# Patient Record
Sex: Female | Born: 1935 | Race: Black or African American | Hispanic: No | Smoking: Never smoker
Health system: Southern US, Community
[De-identification: ages and names within clinical notes are randomized; demographics above are authoritative.]

## PROBLEM LIST (undated history)

## (undated) DIAGNOSIS — E785 Hyperlipidemia, unspecified: Secondary | ICD-10-CM

## (undated) DIAGNOSIS — I4891 Unspecified atrial fibrillation: Secondary | ICD-10-CM

## (undated) DIAGNOSIS — K922 Gastrointestinal hemorrhage, unspecified: Secondary | ICD-10-CM

## (undated) DIAGNOSIS — I639 Cerebral infarction, unspecified: Secondary | ICD-10-CM

## (undated) DIAGNOSIS — I10 Essential (primary) hypertension: Secondary | ICD-10-CM

## (undated) DIAGNOSIS — E079 Disorder of thyroid, unspecified: Secondary | ICD-10-CM

## (undated) DIAGNOSIS — F0390 Unspecified dementia without behavioral disturbance: Secondary | ICD-10-CM

## (undated) DIAGNOSIS — E119 Type 2 diabetes mellitus without complications: Secondary | ICD-10-CM

## (undated) HISTORY — PX: EYE SURGERY: SHX253

---

## 2005-11-05 ENCOUNTER — Ambulatory Visit: Payer: Self-pay | Admitting: Internal Medicine

## 2006-03-10 ENCOUNTER — Ambulatory Visit: Payer: Self-pay | Admitting: Unknown Physician Specialty

## 2007-01-15 ENCOUNTER — Ambulatory Visit: Payer: Self-pay | Admitting: Internal Medicine

## 2007-03-03 ENCOUNTER — Ambulatory Visit: Payer: Self-pay | Admitting: Internal Medicine

## 2007-04-15 ENCOUNTER — Ambulatory Visit: Payer: Self-pay | Admitting: Unknown Physician Specialty

## 2007-07-02 ENCOUNTER — Ambulatory Visit: Payer: Self-pay | Admitting: Unknown Physician Specialty

## 2008-01-12 ENCOUNTER — Ambulatory Visit: Payer: Self-pay | Admitting: Internal Medicine

## 2008-01-21 ENCOUNTER — Ambulatory Visit: Payer: Self-pay | Admitting: Internal Medicine

## 2008-02-24 ENCOUNTER — Ambulatory Visit: Payer: Self-pay | Admitting: General Surgery

## 2008-02-24 ENCOUNTER — Other Ambulatory Visit: Payer: Self-pay

## 2008-03-07 ENCOUNTER — Ambulatory Visit: Payer: Self-pay | Admitting: General Surgery

## 2008-03-26 ENCOUNTER — Ambulatory Visit: Payer: Self-pay | Admitting: Oncology

## 2008-04-07 ENCOUNTER — Ambulatory Visit: Payer: Self-pay | Admitting: Oncology

## 2008-04-26 ENCOUNTER — Ambulatory Visit: Payer: Self-pay | Admitting: Oncology

## 2008-06-26 ENCOUNTER — Ambulatory Visit: Payer: Self-pay | Admitting: Oncology

## 2008-06-28 ENCOUNTER — Ambulatory Visit: Payer: Self-pay | Admitting: Internal Medicine

## 2008-07-12 ENCOUNTER — Ambulatory Visit: Payer: Self-pay | Admitting: Internal Medicine

## 2008-07-13 ENCOUNTER — Ambulatory Visit: Payer: Self-pay | Admitting: Oncology

## 2008-07-26 ENCOUNTER — Ambulatory Visit: Payer: Self-pay | Admitting: Oncology

## 2009-02-28 ENCOUNTER — Ambulatory Visit: Payer: Self-pay | Admitting: Internal Medicine

## 2009-07-10 ENCOUNTER — Ambulatory Visit: Payer: Self-pay | Admitting: Internal Medicine

## 2009-10-08 ENCOUNTER — Inpatient Hospital Stay: Payer: Self-pay | Admitting: Surgery

## 2009-10-20 ENCOUNTER — Ambulatory Visit: Payer: Self-pay | Admitting: Surgery

## 2009-10-27 ENCOUNTER — Ambulatory Visit: Payer: Self-pay | Admitting: Surgery

## 2010-05-03 ENCOUNTER — Ambulatory Visit: Payer: Self-pay | Admitting: Internal Medicine

## 2011-06-06 ENCOUNTER — Ambulatory Visit: Payer: Self-pay | Admitting: Unknown Physician Specialty

## 2011-07-10 ENCOUNTER — Ambulatory Visit: Payer: Self-pay | Admitting: Internal Medicine

## 2012-10-14 ENCOUNTER — Ambulatory Visit: Payer: Self-pay | Admitting: Internal Medicine

## 2013-12-21 ENCOUNTER — Encounter (INDEPENDENT_AMBULATORY_CARE_PROVIDER_SITE_OTHER): Payer: Self-pay | Admitting: Ophthalmology

## 2013-12-28 ENCOUNTER — Encounter (INDEPENDENT_AMBULATORY_CARE_PROVIDER_SITE_OTHER): Payer: Self-pay | Admitting: Ophthalmology

## 2013-12-29 ENCOUNTER — Encounter (INDEPENDENT_AMBULATORY_CARE_PROVIDER_SITE_OTHER): Payer: 59 | Admitting: Ophthalmology

## 2013-12-29 DIAGNOSIS — H43819 Vitreous degeneration, unspecified eye: Secondary | ICD-10-CM

## 2013-12-29 DIAGNOSIS — E11319 Type 2 diabetes mellitus with unspecified diabetic retinopathy without macular edema: Secondary | ICD-10-CM

## 2013-12-29 DIAGNOSIS — H35039 Hypertensive retinopathy, unspecified eye: Secondary | ICD-10-CM

## 2013-12-29 DIAGNOSIS — E1139 Type 2 diabetes mellitus with other diabetic ophthalmic complication: Secondary | ICD-10-CM

## 2013-12-29 DIAGNOSIS — H348192 Central retinal vein occlusion, unspecified eye, stable: Secondary | ICD-10-CM

## 2013-12-29 DIAGNOSIS — E1165 Type 2 diabetes mellitus with hyperglycemia: Secondary | ICD-10-CM

## 2013-12-29 DIAGNOSIS — I10 Essential (primary) hypertension: Secondary | ICD-10-CM

## 2014-02-28 ENCOUNTER — Ambulatory Visit: Payer: Self-pay | Admitting: Internal Medicine

## 2014-05-03 ENCOUNTER — Ambulatory Visit (INDEPENDENT_AMBULATORY_CARE_PROVIDER_SITE_OTHER): Payer: 59 | Admitting: Ophthalmology

## 2014-05-03 DIAGNOSIS — H35039 Hypertensive retinopathy, unspecified eye: Secondary | ICD-10-CM

## 2014-05-03 DIAGNOSIS — E11319 Type 2 diabetes mellitus with unspecified diabetic retinopathy without macular edema: Secondary | ICD-10-CM

## 2014-05-03 DIAGNOSIS — I10 Essential (primary) hypertension: Secondary | ICD-10-CM

## 2014-05-03 DIAGNOSIS — H43819 Vitreous degeneration, unspecified eye: Secondary | ICD-10-CM

## 2014-05-03 DIAGNOSIS — E1139 Type 2 diabetes mellitus with other diabetic ophthalmic complication: Secondary | ICD-10-CM

## 2014-05-03 DIAGNOSIS — H348192 Central retinal vein occlusion, unspecified eye, stable: Secondary | ICD-10-CM | POA: Diagnosis not present

## 2014-05-03 DIAGNOSIS — E1165 Type 2 diabetes mellitus with hyperglycemia: Secondary | ICD-10-CM | POA: Diagnosis not present

## 2014-11-03 ENCOUNTER — Ambulatory Visit (INDEPENDENT_AMBULATORY_CARE_PROVIDER_SITE_OTHER): Payer: 59 | Admitting: Ophthalmology

## 2015-07-04 DIAGNOSIS — E039 Hypothyroidism, unspecified: Secondary | ICD-10-CM | POA: Insufficient documentation

## 2015-07-26 ENCOUNTER — Emergency Department: Payer: Medicare Other

## 2015-07-26 ENCOUNTER — Inpatient Hospital Stay: Payer: Medicare Other

## 2015-07-26 ENCOUNTER — Inpatient Hospital Stay
Admission: EM | Admit: 2015-07-26 | Discharge: 2015-07-27 | DRG: 305 | Disposition: A | Discharge status: Home / Self Care | Payer: Medicare Other | Attending: Internal Medicine | Admitting: Internal Medicine

## 2015-07-26 ENCOUNTER — Inpatient Hospital Stay (HOSPITAL_COMMUNITY)
Admit: 2015-07-26 | Discharge: 2015-07-26 | Disposition: A | Discharge status: Home / Self Care | Payer: Medicare Other | Attending: Internal Medicine | Admitting: Internal Medicine

## 2015-07-26 DIAGNOSIS — Z9889 Other specified postprocedural states: Secondary | ICD-10-CM

## 2015-07-26 DIAGNOSIS — E039 Hypothyroidism, unspecified: Secondary | ICD-10-CM | POA: Diagnosis present

## 2015-07-26 DIAGNOSIS — I69398 Other sequelae of cerebral infarction: Secondary | ICD-10-CM

## 2015-07-26 DIAGNOSIS — N289 Disorder of kidney and ureter, unspecified: Secondary | ICD-10-CM | POA: Diagnosis present

## 2015-07-26 DIAGNOSIS — I10 Essential (primary) hypertension: Principal | ICD-10-CM | POA: Diagnosis present

## 2015-07-26 DIAGNOSIS — I340 Nonrheumatic mitral (valve) insufficiency: Secondary | ICD-10-CM

## 2015-07-26 DIAGNOSIS — Z888 Allergy status to other drugs, medicaments and biological substances status: Secondary | ICD-10-CM | POA: Diagnosis not present

## 2015-07-26 DIAGNOSIS — E1165 Type 2 diabetes mellitus with hyperglycemia: Secondary | ICD-10-CM | POA: Diagnosis present

## 2015-07-26 DIAGNOSIS — Z79899 Other long term (current) drug therapy: Secondary | ICD-10-CM | POA: Diagnosis not present

## 2015-07-26 DIAGNOSIS — E785 Hyperlipidemia, unspecified: Secondary | ICD-10-CM | POA: Diagnosis present

## 2015-07-26 DIAGNOSIS — I69312 Visuospatial deficit and spatial neglect following cerebral infarction: Secondary | ICD-10-CM | POA: Diagnosis not present

## 2015-07-26 DIAGNOSIS — H539 Unspecified visual disturbance: Secondary | ICD-10-CM | POA: Diagnosis present

## 2015-07-26 DIAGNOSIS — G459 Transient cerebral ischemic attack, unspecified: Secondary | ICD-10-CM

## 2015-07-26 DIAGNOSIS — Z7982 Long term (current) use of aspirin: Secondary | ICD-10-CM | POA: Diagnosis not present

## 2015-07-26 DIAGNOSIS — I639 Cerebral infarction, unspecified: Secondary | ICD-10-CM | POA: Diagnosis present

## 2015-07-26 DIAGNOSIS — D638 Anemia in other chronic diseases classified elsewhere: Secondary | ICD-10-CM | POA: Diagnosis present

## 2015-07-26 DIAGNOSIS — N189 Chronic kidney disease, unspecified: Secondary | ICD-10-CM

## 2015-07-26 DIAGNOSIS — I4891 Unspecified atrial fibrillation: Secondary | ICD-10-CM | POA: Diagnosis present

## 2015-07-26 DIAGNOSIS — R42 Dizziness and giddiness: Secondary | ICD-10-CM

## 2015-07-26 HISTORY — DX: Unspecified atrial fibrillation: I48.91

## 2015-07-26 HISTORY — DX: Type 2 diabetes mellitus without complications: E11.9

## 2015-07-26 HISTORY — DX: Hyperlipidemia, unspecified: E78.5

## 2015-07-26 HISTORY — DX: Essential (primary) hypertension: I10

## 2015-07-26 HISTORY — DX: Disorder of thyroid, unspecified: E07.9

## 2015-07-26 LAB — BASIC METABOLIC PANEL
Anion gap: 7 (ref 5–15)
BUN: 27 mg/dL — AB (ref 6–20)
CHLORIDE: 113 mmol/L — AB (ref 101–111)
CO2: 24 mmol/L (ref 22–32)
CREATININE: 1.47 mg/dL — AB (ref 0.44–1.00)
Calcium: 8.7 mg/dL — ABNORMAL LOW (ref 8.9–10.3)
GFR calc Af Amer: 37 mL/min — ABNORMAL LOW (ref >60)
GFR calc non Af Amer: 32 mL/min — ABNORMAL LOW (ref >60)
Glucose, Bld: 150 mg/dL — ABNORMAL HIGH (ref 65–99)
POTASSIUM: 4.1 mmol/L (ref 3.5–5.1)
SODIUM: 144 mmol/L (ref 135–145)

## 2015-07-26 LAB — URINALYSIS COMPLETE WITH MICROSCOPIC (ARMC ONLY)
BILIRUBIN URINE: NEGATIVE
GLUCOSE, UA: NEGATIVE mg/dL
HGB URINE DIPSTICK: NEGATIVE
Ketones, ur: NEGATIVE mg/dL
Nitrite: NEGATIVE
Protein, ur: 30 mg/dL — AB
Specific Gravity, Urine: 1.009 (ref 1.005–1.030)
pH: 6 (ref 5.0–8.0)

## 2015-07-26 LAB — CBC
HEMATOCRIT: 32.5 % — AB (ref 35.0–47.0)
Hemoglobin: 10.4 g/dL — ABNORMAL LOW (ref 12.0–16.0)
MCH: 25.2 pg — AB (ref 26.0–34.0)
MCHC: 32 g/dL (ref 32.0–36.0)
MCV: 78.6 fL — AB (ref 80.0–100.0)
PLATELETS: 300 10*3/uL (ref 150–440)
RBC: 4.13 MIL/uL (ref 3.80–5.20)
RDW: 16.4 % — AB (ref 11.5–14.5)
WBC: 5.7 10*3/uL (ref 3.6–11.0)

## 2015-07-26 LAB — GLUCOSE, CAPILLARY
GLUCOSE-CAPILLARY: 88 mg/dL (ref 65–99)
Glucose-Capillary: 186 mg/dL — ABNORMAL HIGH (ref 65–99)

## 2015-07-26 MED ORDER — LATANOPROST 0.005 % OP SOLN
1.0000 [drp] | Freq: Every day | OPHTHALMIC | Status: DC
  Administered 2015-07-26: 1 [drp] via OPHTHALMIC
  Filled 2015-07-26: qty 2.5

## 2015-07-26 MED ORDER — INSULIN ASPART 100 UNIT/ML ~~LOC~~ SOLN
4.0000 [IU] | Freq: Three times a day (TID) | SUBCUTANEOUS | Status: DC
  Administered 2015-07-26: 4 [IU] via SUBCUTANEOUS

## 2015-07-26 MED ORDER — ACETAMINOPHEN 650 MG RE SUPP: 650.0000 mg | Freq: Four times a day (QID) | RECTAL | Status: DC | PRN

## 2015-07-26 MED ORDER — SODIUM CHLORIDE 0.9 % IJ SOLN
3.0000 mL | Freq: Two times a day (BID) | INTRAMUSCULAR | Status: DC
  Administered 2015-07-26 – 2015-07-27 (×3): 3 mL via INTRAVENOUS

## 2015-07-26 MED ORDER — SODIUM CHLORIDE 0.9 % IJ SOLN: 3.0000 mL | INTRAMUSCULAR | Status: DC | PRN

## 2015-07-26 MED ORDER — INSULIN ASPART 100 UNIT/ML ~~LOC~~ SOLN
0.0000 [IU] | Freq: Three times a day (TID) | SUBCUTANEOUS | Status: DC
  Administered 2015-07-26: 2 [IU] via SUBCUTANEOUS
  Filled 2015-07-26: qty 6

## 2015-07-26 MED ORDER — LISINOPRIL 20 MG PO TABS
40.0000 mg | ORAL_TABLET | Freq: Every day | ORAL | Status: DC
  Administered 2015-07-26 – 2015-07-27 (×2): 40 mg via ORAL
  Filled 2015-07-26: qty 1
  Filled 2015-07-26 (×2): qty 2

## 2015-07-26 MED ORDER — LEVOTHYROXINE SODIUM 112 MCG PO TABS
112.0000 ug | ORAL_TABLET | ORAL | Status: DC
  Administered 2015-07-27: 112 ug via ORAL
  Filled 2015-07-26: qty 1

## 2015-07-26 MED ORDER — ONDANSETRON HCL 4 MG/2ML IJ SOLN: 4.0000 mg | Freq: Four times a day (QID) | INTRAMUSCULAR | Status: DC | PRN

## 2015-07-26 MED ORDER — FERROUS SULFATE 325 (65 FE) MG PO TABS
325.0000 mg | ORAL_TABLET | Freq: Every day | ORAL | Status: DC
  Administered 2015-07-26 – 2015-07-27 (×2): 325 mg via ORAL
  Filled 2015-07-26 (×2): qty 1

## 2015-07-26 MED ORDER — VITAMIN B-12 1000 MCG PO TABS
1000.0000 ug | ORAL_TABLET | Freq: Every day | ORAL | Status: DC
  Administered 2015-07-26 – 2015-07-27 (×2): 1000 ug via ORAL
  Filled 2015-07-26 (×2): qty 1

## 2015-07-26 MED ORDER — ATORVASTATIN CALCIUM 10 MG PO TABS
10.0000 mg | ORAL_TABLET | Freq: Every day | ORAL | Status: DC
  Administered 2015-07-26: 10 mg via ORAL
  Filled 2015-07-26: qty 1

## 2015-07-26 MED ORDER — ENOXAPARIN SODIUM 40 MG/0.4ML ~~LOC~~ SOLN: 40.0000 mg | SUBCUTANEOUS | Status: DC

## 2015-07-26 MED ORDER — ACETAMINOPHEN 325 MG PO TABS: 650.0000 mg | ORAL_TABLET | Freq: Four times a day (QID) | ORAL | Status: DC | PRN

## 2015-07-26 MED ORDER — MECLIZINE HCL 25 MG PO TABS
25.0000 mg | ORAL_TABLET | Freq: Three times a day (TID) | ORAL | Status: DC | PRN
  Administered 2015-07-26: 25 mg via ORAL
  Filled 2015-07-26: qty 1

## 2015-07-26 MED ORDER — ASPIRIN EC 325 MG PO TBEC
325.0000 mg | DELAYED_RELEASE_TABLET | Freq: Every day | ORAL | Status: DC
  Administered 2015-07-26 – 2015-07-27 (×2): 325 mg via ORAL
  Filled 2015-07-26 (×2): qty 1

## 2015-07-26 MED ORDER — ONDANSETRON HCL 4 MG PO TABS: 4.0000 mg | ORAL_TABLET | Freq: Four times a day (QID) | ORAL | Status: DC | PRN

## 2015-07-26 MED ORDER — SODIUM CHLORIDE 0.9 % IV SOLN: 250.0000 mL | INTRAVENOUS | Status: DC | PRN

## 2015-07-26 MED ORDER — ENOXAPARIN SODIUM 30 MG/0.3ML ~~LOC~~ SOLN
30.0000 mg | SUBCUTANEOUS | Status: DC
  Administered 2015-07-26: 30 mg via SUBCUTANEOUS
  Filled 2015-07-26: qty 0.3

## 2015-07-26 MED ORDER — INSULIN ASPART 100 UNIT/ML ~~LOC~~ SOLN: 0.0000 [IU] | Freq: Every day | SUBCUTANEOUS | Status: DC

## 2015-07-26 MED ORDER — GLIPIZIDE ER 10 MG PO TB24
10.0000 mg | ORAL_TABLET | Freq: Every day | ORAL | Status: DC
  Administered 2015-07-26 – 2015-07-27 (×2): 10 mg via ORAL
  Filled 2015-07-26 (×2): qty 1

## 2015-07-26 NOTE — Evaluation (Signed)
Physical Therapy Evaluation Patient Details Name: Patricia Dudley MRN: [id number] DOB: Nov 19, 1929 Today's Date: 07/26/2015   History of Present Illness  Pt is an 81 y/o female that presents after experiencing dizziness and numbness/tingling in her L side at home. Noted to have systolic BP at home of over 240, since that time it has decreased. No current complaints of headache/chest pain.   Clinical Impression  Patient admitted with TIA like symptoms with L sided numbness/tingling. She appears at roughly her baseline mobility currently with no loss of balance and 10/12 on modified DGI during ambulation without AD. She demonstrates symmetrical gait and no deficits from baseline identified aside from above mentioned drifting with ambulation during head turns. She would benefit from continued skilled PT services to provide HEP for higher level balance activities (tandem stance, single leg stance).     Follow Up Recommendations No PT follow up    Equipment Recommendations       Recommendations for Other Services       Precautions / Restrictions Precautions Precautions: None Restrictions Weight Bearing Restrictions: No      Mobility  Bed Mobility Overal bed mobility: Independent             General bed mobility comments: No deficits noted.   Transfers Overall transfer level: Independent               General transfer comment: Patient demonstrates no loss of balance in standing.   Ambulation/Gait Ambulation/Gait assistance: Supervision Ambulation Distance (Feet): 200 Feet   Gait Pattern/deviations: WFL(Within Functional Limits)   Gait velocity interpretation: at or above normal speed for age/gender General Gait Details: Mild loss of speed with horizontal/vertical head turns, no other balance deficits noted. This appears to be her baseline.   Stairs            Wheelchair Mobility    Modified Rankin (Stroke Patients Only)       Balance Overall  balance assessment: Needs assistance   Sitting balance-Leahy Scale: Good     Standing balance support: No upper extremity supported Standing balance-Leahy Scale: Good Standing balance comment: Modified DGI of 10/12 without AD indicating she is not a high falls risk.                              Pertinent Vitals/Pain Pain Assessment: No/denies pain    Home Living Family/patient expects to be discharged to:: Private residence Living Arrangements: Children Available Help at Discharge: Family Type of Home: House Home Access: Stairs to enter   Technical brewer of Steps: 1 Home Layout: One level Home Equipment: None      Prior Function Level of Independence: Independent         Comments: Patient reports tripping over something at one point, no recent falls.      Hand Dominance        Extremity/Trunk Assessment   Upper Extremity Assessment: Overall WFL for tasks assessed           Lower Extremity Assessment: Overall WFL for tasks assessed         Communication   Communication: No difficulties  Cognition Arousal/Alertness: Awake/alert Behavior During Therapy: WFL for tasks assessed/performed Overall Cognitive Status: Within Functional Limits for tasks assessed (She may be hard of hearing, but required repetition of instructions on occassion. )                      General  Comments      Exercises        Assessment/Plan    PT Assessment Patient needs continued PT services  PT Diagnosis Difficulty walking   PT Problem List Decreased strength;Decreased balance  PT Treatment Interventions Gait training;Therapeutic activities;Therapeutic exercise;Balance training   PT Goals (Current goals can be found in the Care Plan section) Acute Rehab PT Goals Patient Stated Goal: To return home.  PT Goal Formulation: With patient Time For Goal Achievement: 08/09/15 Potential to Achieve Goals: Good Additional Goals Additional Goal #1:   (Pt will demonstrate low falls risk on Berg Balance Test )    Frequency Min 2X/week   Barriers to discharge        Co-evaluation               End of Session Equipment Utilized During Treatment: Gait belt Activity Tolerance: Patient tolerated treatment well Patient left: in chair;with call bell/phone within reach;with family/visitor present Nurse Communication: Mobility status    Functional Assessment Tool Used: Modified DGI Functional Limitation: Mobility: Walking and moving around Mobility: Walking and Moving Around Current Status [phone]): At least 1 percent but less than 20 percent impaired, limited or restricted Mobility: Walking and Moving Around Goal Status [phone]): At least 1 percent but less than 20 percent impaired, limited or restricted    Time: [id number] PT Time Calculation (min) (ACUTE ONLY): 10 min   Charges:   PT Evaluation $Initial PT Evaluation Tier I: 1 Procedure     PT G Codes:   PT G-Codes **NOT FOR INPATIENT CLASS** Functional Assessment Tool Used: Modified DGI Functional Limitation: Mobility: Walking and moving around Mobility: Walking and Moving Around Current Status [id number]): At least 1 percent but less than 20 percent impaired, limited or restricted Mobility: Walking and Moving Around Goal Status [phone]): At least 1 percent but less than 20 percent impaired, limited or restricted    Kerman Passey, PT, DPT    07/26/2015, 3:05 PM

## 2015-07-26 NOTE — Care Management Obs Status (Signed)
[facility] NOTIFICATION   Patient Details  Name: Patricia Dudley MRN: [id number] Date of Birth: [birth year]   Medicare Observation Status Notification Given:  Yes  OBSV notice to patient and family in ER;daughter signed notice.   Beau Fanny, RN 07/26/2015, 1:02 PM

## 2015-07-26 NOTE — Progress Notes (Signed)
Received pt from ED to Rm 235. Pt AOx4. Hypertensive and asymptomatic.  On tele. No signs of acute distress. IV access intact and saline locked. Medications administered (See MAR). Admission completed. Assessment completed. DVT prophylaxis assessed and completed.   Education provided on call bell, bed alarm, telephone and IV/IV Pole/IV Alarms. 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 White Board was completed and updated PRN.   Dietary specification confirmed.   Family at bedside. Pt resting comfortably and quietly w/bed in low and locked position and call bell and telephone within reach. Will continue to monitor.

## 2015-07-26 NOTE — Progress Notes (Signed)
MD Dr. Ether Griffins notified that pt BP is 168/80.  No new orders given.  Will continue to monitor.

## 2015-07-26 NOTE — ED Provider Notes (Signed)
Time Seen: Approximately *11:15 I have reviewed the triage notes  Chief Complaint: Dizziness   History of Present Illness: Patricia Dudley is a 81 y.o. female *who presents with 2 episodes this morning of "" dizziness"". She describes an episode that is described as dizziness and feeling off balance and her room spinning. Patient states that she broke up with his symptoms of vertigo that seemed to improve after she got up and ambulated to the bathroom. Patient states she then returned to bed when she laid down flat vertiginous symptoms return. The patient denies any focal weakness or difficulty with speech or swallowing. She denies any headaches. She's had a previous history of atrial fibrillation but is normally in a normal sinus rhythm. She was hypertensive per EMS at 240/109. Patient here is 177/68. The patient has not had her normal medications for hypertension. Patient states she had an episode yesterday of some left upper and lower extremity numbness. She states she did not have any vertiginous symptoms at that time. She denies any of these symptoms at this time. Past Medical History  Diagnosis Date  . Hypertension   . Hyperlipemia   . A-fib ([place])   . Diabetes mellitus without complication ([place])   . Thyroid disease     Patient Active Problem List   Diagnosis Date Noted  . 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 Renal insufficiency 07/26/2015  . TIA (transient ischemic attack) 07/26/2015  . Vertigo 07/26/2015    Past Surgical History  Procedure Laterality Date  . Eye surgery      left    Past Surgical History  Procedure Laterality Date  . Eye surgery      left    No current outpatient prescriptions on file.  Allergies:  Iodine  Family History: History reviewed. No pertinent family history.  Social History: Social History  Substance Use Topics  . Smoking status: Never Smoker   . Smokeless tobacco: None  . Alcohol Use: No     Review of Systems:    10 point review of systems was performed and was otherwise negative:  Constitutional: No fever Eyes: She states she's had a previous stroke with visual deficit in the left eye ENT: No sore throat, ear pain Cardiac: No chest pain Respiratory: No shortness of breath, wheezing, or stridor Abdomen: No abdominal pain, no vomiting, No diarrhea Endocrine: No weight loss, No night sweats Extremities: No peripheral edema, cyanosis Skin: No rashes, easy bruising Neurologic: No focal weakness, trouble with speech or swollowing Urologic: No dysuria, Hematuria, or urinary frequency   Physical Exam:  ED Triage Vitals  Enc Vitals Group     BP 07/26/15 1035 177/68 mmHg     Pulse Rate 07/26/15 1035 77     Resp 07/26/15 1035 18     Temp 07/26/15 1035 98.1 F (36.7 C)     Temp Source 07/26/15 1035 Oral     SpO2 07/26/15 1035 100 %     Weight 07/26/15 1035 135 lb (61.236 kg)     Height 07/26/15 1035 5\' 4"  (1.626 m)     Head Cir --      Peak Flow --      Pain Score --      Pain Loc --      Pain Edu? --      Excl. in [place]? --     General: Awake , Alert , and Oriented times 3; GCS 15 Head: Normal cephalic , atraumatic Eyes: Pupils equal , round, reactive  to light no nystagmus. Minimal reaction left eye Nose/Throat: No nasal drainage, patent upper airway without erythema or exudate.  Neck: Supple, Full range of motion, No anterior adenopathy or palpable thyroid masses Lungs: Clear to ascultation without wheezes , rhonchi, or rales Heart: Regular rate, regular rhythm without murmurs , gallops , or rubs Abdomen: Soft, non tender without rebound, guarding , or rigidity; bowel sounds positive and symmetric in all 4 quadrants. No organomegaly .        Extremities: 2 plus symmetric pulses. No edema, clubbing or cyanosis Neurologic: normal ambulation, Motor symmetric without deficits, sensory intact Skin: warm, dry, no rashes   Labs:   All laboratory work was reviewed including any pertinent  negatives or positives listed below:  Labs Reviewed  BASIC METABOLIC PANEL - Abnormal; Notable for the following:    Chloride 113 (*)    Glucose, Bld 150 (*)    BUN 27 (*)    Creatinine, Ser 1.47 (*)    Calcium 8.7 (*)    GFR calc non Af Amer 32 (*)    GFR calc Af Amer 37 (*)    All other components within normal limits  CBC - Abnormal; Notable for the following:    Hemoglobin 10.4 (*)    HCT 32.5 (*)    MCV 78.6 (*)    MCH 25.2 (*)    RDW 16.4 (*)    All other components within normal limits  URINALYSIS COMPLETEWITH MICROSCOPIC ([facility] ONLY) - Abnormal; Notable for the following:    Color, Urine STRAW (*)    APPearance CLEAR (*)    Protein, ur 30 (*)    Leukocytes, UA TRACE (*)    Bacteria, UA RARE (*)    Squamous Epithelial / LPF 0-5 (*)    All other components within normal limits  HEMOGLOBIN [id number]  BASIC METABOLIC PANEL  CBC   laboratory work was reviewed and showed no significant abnormalities other than a slightly elevated creatinine.  EKG: *  ED ECG REPORT I, Daymon Larsen, the attending physician, personally viewed and interpreted this ECG.  Date: 07/26/2015 EKG Time: 1032 Rate: 72. Rhythm: normal sinus rhythm with occasional PACs QRS Axis: normal Intervals: normal ST/T Wave abnormalities: normal Conduction Disutrbances: none Narrative Interpretation: unremarkable No acute ischemic changes EXAM: CT HEAD WITHOUT CONTRAST  TECHNIQUE: Contiguous axial images were obtained from the base of the skull through the vertex without intravenous contrast.  COMPARISON: Brain MRI July 10, 2009  FINDINGS: There is age related volume loss. There is no intracranial mass, hemorrhage, extra-axial fluid collection, or midline shift. There is small vessel disease throughout the centra semiovale bilaterally, stable. There is a prior small infarct in the anterior limb of the right external capsule. Elsewhere gray-white compartments appear unremarkable. No acute  infarct apparent. The bony calvarium appears intact. The mastoid air cells are clear. Basal ganglia calcification is felt to be physiologic in this age group. Scattered small foci of calcification in the tentorium may be physiologic as well in this age group.  IMPRESSION: Extensive periventricular small vessel disease, stable. Prior small infarct in the anterior limb of the right external capsule. No acute infarct evident. No hemorrhage or mass effect.   Radiology: *   I personally reviewed the radiologic studies   P ED Course: Patient's stay here showed no significant change. Patient's head CT shows periventricular small vessel disease and a previous infarct with no obvious acute disease. Her symptoms were concerning that her vertigo seemed to be while she  was lying flat in bed and seemed to be intermittent in nature with a history of previous cerebrovascular accident. She also had the episode of numbness yesterday again raising concern for transient ischemic attacks. Patient has not had any recent stroke evaluation I spoke to the hospitalist team, they've agreed to see and evaluate the patient. Further disposition and management depends upon their evaluation. He was given aspirin here in emergency department.    Assessment:  Transient ischemic attack versus cerebrovascular accident Possible central vertigo    Plan:   Inpatient management           Daymon Larsen, MD 07/26/15 [phone]

## 2015-07-26 NOTE — ED Notes (Addendum)
Pt states when she woke up this morning she had dizziness,states she walked to the BR and the dizziness stopped, states she laid back down and the dizziness returned..the patient denies hx of dizziness/vertigo.. Denies pain or HA.Marland Kitchen Denies N/V.The patient has a history of A-fib. EMS reports pt b/p 240/109.

## 2015-07-26 NOTE — H&P (Signed)
[facility] [place] NAME: Patricia Dudley    MR#:  [id number]  DATE OF BIRTH:  [birth year]  DATE OF ADMISSION:  07/26/2015  PRIMARY CARE PHYSICIAN: No primary care provider on file.   REQUESTING/REFERRING PHYSICIAN:   CHIEF COMPLAINT:   Chief Complaint  Patient presents with  . Dizziness    HISTORY OF PRESENT ILLNESS: Patricia Dudley  is a 81 y.o. female with a known history of atrial fibrillation, hypertension, hyperlipidemia, diabetes mellitus, stroke, which left her with left eye vision deficit who presents to the hospital with complaints of dizziness, vertigo. According to patient, she did well up until a few days ago when she started having left sided numbness and tingling sensation. It went away in a few minutes. However, today she became vertiginous early in the morning. She was able to walk to the bathroom. However, had an episode of nausea and vomiting earlier today. EMS was called and she was found to have significant hypertension with systolic blood pressure of 240. She was brought to emergency room for further evaluation. Labs in emergency room revealed a mild renal insufficiency, hyperglycemia, anemia, head CT showed no acute disease, but prior small infarct in anterior limb of right external capsule. Hospitalist services were contacted for admission  PAST MEDICAL HISTORY:   Past Medical History  Diagnosis Date  . Hypertension   . Hyperlipemia   . A-fib ([place])   . Diabetes mellitus without complication ([place])   . Thyroid disease     PAST SURGICAL HISTORY:  Past Surgical History  Procedure Laterality Date  . Eye surgery      left    SOCIAL HISTORY:  Social History  Substance Use Topics  . Smoking status: Never Smoker   . Smokeless tobacco: Not on file  . Alcohol Use: No    FAMILY HISTORY: No family history on file.  DRUG ALLERGIES:  Allergies  Allergen Reactions  . Iodine Rash    Review of Systems   Constitutional: Positive for malaise/fatigue. Negative for fever, chills and weight loss.  HENT: Negative for congestion.   Eyes: Positive for blurred vision. Negative for double vision.  Respiratory: Positive for cough. Negative for sputum production, shortness of breath and wheezing.   Cardiovascular: Positive for leg swelling. Negative for chest pain, palpitations, orthopnea and PND.  Gastrointestinal: Positive for nausea, vomiting and diarrhea. Negative for abdominal pain, constipation, blood in stool and melena.  Genitourinary: Negative for dysuria, urgency, frequency and hematuria.  Musculoskeletal: Negative for falls.  Skin: Negative for rash.  Neurological: Positive for dizziness and sensory change. Negative for weakness.  Psychiatric/Behavioral: Negative for depression and memory loss. The patient is not nervous/anxious.     MEDICATIONS AT HOME:  Prior to Admission medications   Medication Sig Start Date End Date Taking? Authorizing Provider  aspirin EC 81 MG tablet Take 81 mg by mouth at bedtime.   Yes Historical Provider, MD  atorvastatin (LIPITOR) 10 MG tablet Take 10 mg by mouth at bedtime.   Yes Historical Provider, MD  ferrous sulfate 325 (65 FE) MG EC tablet Take 325 mg by mouth daily.   Yes Historical Provider, MD  glipiZIDE (GLUCOTROL XL) 10 MG 24 hr tablet Take 10 mg by mouth daily.   Yes Historical Provider, MD  latanoprost (XALATAN) 0.005 % ophthalmic solution Place 1 drop into both eyes at bedtime.   Yes Historical Provider, MD  levothyroxine (SYNTHROID, LEVOTHROID) 112 MCG tablet Take 112 mcg by mouth daily.  Yes Historical Provider, MD  lisinopril (PRINIVIL,ZESTRIL) 20 MG tablet Take 20 mg by mouth daily.   Yes Historical Provider, MD  vitamin B-12 (CYANOCOBALAMIN) 1000 MCG tablet Take 1,000 mcg by mouth daily.   Yes Historical Provider, MD      PHYSICAL EXAMINATION:   VITAL SIGNS: Blood pressure 178/73, pulse 55, temperature 98.1 F (36.7 C), temperature  source Oral, resp. rate 12, height 5\' 4"  (1.626 m), weight 61.236 kg (135 lb), SpO2 99 %.  GENERAL:  81 y.o.-year-old patient lying in the bed with no acute distress.  EYES: Pupils equal, round, reactive to light and accommodation. No scleral icterus. Extraocular muscles intact.  HEENT: Head atraumatic, normocephalic. Oropharynx and nasopharynx clear.  NECK:  Supple, no jugular venous distention. No thyroid enlargement, no tenderness.  LUNGS: Normal breath sounds bilaterally, no wheezing, rales,rhonchi or crepitation. No use of accessory muscles of respiration.  CARDIOVASCULAR: S1, S2 normal. No murmurs, rubs, or gallops.  ABDOMEN: Soft, nontender, nondistended. Bowel sounds present. No organomegaly or mass.  EXTREMITIES: No pedal edema, cyanosis, or clubbing.  NEUROLOGIC: Cranial nerves II through XII are intact. Muscle strength 5/5 in all extremities. Sensation intact. Gait not checked.  PSYCHIATRIC: The patient is alert and oriented x 3.  SKIN: No obvious rash, lesion, or ulcer.   LABORATORY PANEL:   CBC  Recent Labs Lab 07/26/15 1047  WBC 5.7  HGB 10.4*  HCT 32.5*  PLT 300  MCV 78.6*  MCH 25.2*  MCHC 32.0  RDW 16.4*   ------------------------------------------------------------------------------------------------------------------  Chemistries   Recent Labs Lab 07/26/15 1047  NA 144  K 4.1  CL 113*  CO2 24  GLUCOSE 150*  BUN 27*  CREATININE 1.47*  CALCIUM 8.7*   ------------------------------------------------------------------------------------------------------------------  Cardiac Enzymes No results for input(s): TROPONINI in the last 168 hours. ------------------------------------------------------------------------------------------------------------------  RADIOLOGY: Ct Head Wo Contrast  07/26/2015  CLINICAL DATA:  Dizziness, intermittent EXAM: CT HEAD WITHOUT CONTRAST TECHNIQUE: Contiguous axial images were obtained from the base of the skull through  the vertex without intravenous contrast. COMPARISON:  Brain MRI July 10, 2009 FINDINGS: There is age related volume loss. There is no intracranial mass, hemorrhage, extra-axial fluid collection, or midline shift. There is small vessel disease throughout the centra semiovale bilaterally, stable. There is a prior small infarct in the anterior limb of the right external capsule. Elsewhere gray-white compartments appear unremarkable. No acute infarct apparent. The bony calvarium appears intact. The mastoid air cells are clear. Basal ganglia calcification is felt to be physiologic in this age group. Scattered small foci of calcification in the tentorium may be physiologic as well in this age group. IMPRESSION: Extensive periventricular small vessel disease, stable. Prior small infarct in the anterior limb of the right external capsule. No acute infarct evident. No hemorrhage or mass effect. Electronically Signed   By: Lowella Grip III M.D.   On: 07/26/2015 12:10    EKG: Orders placed or performed during the hospital encounter of 07/26/15  . ED EKG  . ED EKG    IMPRESSION AND PLAN:  Principal Problem:   Renal insufficiency Active Problems:   TIA (transient ischemic attack)   Malignant essential hypertension   Vertigo 1.  TIA with left-sided sensory changes, admit patient medical for initiate her on full dose of aspirin. Continue Lipitor, lipid panel done by primary care physician first of November revealed  LDL of 70. Get carotid ultrasound and echocardiogram 2. Malignant essential hypertension, continue lisinopril. Advance dose 3. Renal insufficiency, chronic, stable 4. Vertigo, likely malignant  hypertension related, continue aspirin at the meclizine as needed    All the records are reviewed and case discussed with ED provider. Management plans discussed with the patient, family and they are in agreement.  CODE STATUS:    TOTAL TIME TAKING CARE OF THIS PATIENT: 50 minutes.     Theodoro Grist M.D on 07/26/2015 at 1:02 PM  Between 7am to 6pm - Pager - [phone] After 6pm go to [email or web address] - password [email or web address] [facility]  [facility]  Office  [phone]  CC: Primary care physician; No primary care provider on file.

## 2015-07-27 LAB — GLUCOSE, CAPILLARY
GLUCOSE-CAPILLARY: 83 mg/dL (ref 65–99)
Glucose-Capillary: 175 mg/dL — ABNORMAL HIGH (ref 65–99)

## 2015-07-27 LAB — BASIC METABOLIC PANEL
Anion gap: 7 (ref 5–15)
BUN: 26 mg/dL — AB (ref 6–20)
CALCIUM: 8.5 mg/dL — AB (ref 8.9–10.3)
CO2: 24 mmol/L (ref 22–32)
Chloride: 110 mmol/L (ref 101–111)
Creatinine, Ser: 1.41 mg/dL — ABNORMAL HIGH (ref 0.44–1.00)
GFR calc Af Amer: 39 mL/min — ABNORMAL LOW (ref >60)
GFR, EST NON AFRICAN AMERICAN: 33 mL/min — AB (ref >60)
GLUCOSE: 84 mg/dL (ref 65–99)
Potassium: 3.9 mmol/L (ref 3.5–5.1)
SODIUM: 141 mmol/L (ref 135–145)

## 2015-07-27 LAB — HEMOGLOBIN A1C: HEMOGLOBIN A1C: 6.5 % — AB (ref 4.0–6.0)

## 2015-07-27 LAB — CBC
HCT: 30.6 % — ABNORMAL LOW (ref 35.0–47.0)
Hemoglobin: 10.5 g/dL — ABNORMAL LOW (ref 12.0–16.0)
MCH: 26.8 pg (ref 26.0–34.0)
MCHC: 34.5 g/dL (ref 32.0–36.0)
MCV: 77.8 fL — ABNORMAL LOW (ref 80.0–100.0)
PLATELETS: 277 10*3/uL (ref 150–440)
RBC: 3.93 MIL/uL (ref 3.80–5.20)
RDW: 16.4 % — AB (ref 11.5–14.5)
WBC: 5.5 10*3/uL (ref 3.6–11.0)

## 2015-07-27 MED ORDER — HYDRALAZINE HCL 20 MG/ML IJ SOLN
20.0000 mg | Freq: Four times a day (QID) | INTRAMUSCULAR | Status: DC | PRN
  Administered 2015-07-27: 20 mg via INTRAVENOUS
  Filled 2015-07-27: qty 1

## 2015-07-27 MED ORDER — LISINOPRIL 40 MG PO TABS
40.0000 mg | ORAL_TABLET | Freq: Every day | ORAL | Status: DC
Start: 2015-07-27 — End: 2022-01-31

## 2015-07-27 MED ORDER — HYDROCHLOROTHIAZIDE 12.5 MG PO TABS: 25.0000 mg | ORAL_TABLET | Freq: Every day | ORAL | Status: DC

## 2015-07-27 MED ORDER — HYDRALAZINE HCL 25 MG PO TABS: 25.0000 mg | ORAL_TABLET | Freq: Three times a day (TID) | ORAL | Status: DC

## 2015-07-27 NOTE — Progress Notes (Signed)
No further needs per CM, Nann.

## 2015-07-27 NOTE — Progress Notes (Signed)
Dr. Benjie Karvonen aware that BP has come down and headache is gone. Instructed to proceed with discharge.

## 2015-07-27 NOTE — Progress Notes (Addendum)
Dr. Benjie Karvonen notified that patient is complaining of headache and high BP. MD to place orders. Instructed to give dose of IV hydralazine and monitor BP again in an hour or so and call MD with results.

## 2015-07-27 NOTE — Discharge Summary (Addendum)
[facility] NAME: Patricia Dudley    MR#:  [id number]  DATE OF BIRTH:  03-11-1930  DATE OF ADMISSION:  07/26/2015 ADMITTING PHYSICIAN: Theodoro Grist, MD  DATE OF DISCHARGE: 07/27/2015  PRIMARY CARE PHYSICIAN: Dr. Ginette Pitman    ADMISSION DIAGNOSIS:  CVA (cerebral infarction) [I63.9]  DISCHARGE DIAGNOSIS:  Principal Problem:   Renal insufficiency Active Problems:   Malignant essential hypertension   TIA (transient ischemic attack)   Vertigo   SECONDARY DIAGNOSIS:   Past Medical History  Diagnosis Date  . Hypertension   . Hyperlipemia   . A-fib ([place])   . Diabetes mellitus without complication ([place])   . Thyroid disease     HOSPITAL COURSE:  81 year old female with known history of atrial fibrillation, CVA and left eye ,hypertension and diabetes who presented with dizziness. For further details please refer the H&P.  1. Vertigo: Her symptoms were consistent with vertigo. I suspect her malignant hypertension had a large role in her symptoms. She is completely asymptomatic now and has no neurological deficits.  2. Malignant hypertension: I suspect this is the etiology of her vertigo. She had no evidence of any neurological deficits. I adjusted her blood pressure medications and she will have follow-up in 3-5 days with her primary care physician for her blood pressure.  3. Hypothyroidism: Continue Synthroid  4. Diabetes: Continue Glucotrol  5 Hyperlipidemia: Continue Lipitor  6. Anemia of chronic disease: Continue ferrous sulfate  7. History of atrial fibrillation: Patient will follow-up with Dr. Ginette Pitman for this. Patient is on aspirin. Patient's heart rate was controlled. Patient is not on any heart rate controlling medications. Patient has had gastritis in the past and therefore I suspect this is the reason she is not on anticoagulation.  DISCHARGE CONDITIONS AND DIET:   Patient is being discharged home in stable  condition  CONSULTS OBTAINED:     DRUG ALLERGIES:   Allergies  Allergen Reactions  . Iodine Rash    DISCHARGE MEDICATIONS:   Current Discharge Medication List    START taking these medications   Details  Hydralazine 25 mg PO TID        CONTINUE these medications which have CHANGED   Details  lisinopril (PRINIVIL,ZESTRIL) 40 MG tablet Take 1 tablet (40 mg total) by mouth daily. Qty: 30 tablet, Refills: 0      CONTINUE these medications which have NOT CHANGED   Details  aspirin EC 81 MG tablet Take 81 mg by mouth at bedtime.    atorvastatin (LIPITOR) 10 MG tablet Take 10 mg by mouth at bedtime.    ferrous sulfate 325 (65 FE) MG EC tablet Take 325 mg by mouth daily.    glipiZIDE (GLUCOTROL XL) 10 MG 24 hr tablet Take 10 mg by mouth daily.    latanoprost (XALATAN) 0.005 % ophthalmic solution Place 1 drop into both eyes at bedtime.    levothyroxine (SYNTHROID, LEVOTHROID) 112 MCG tablet Take 112 mcg by mouth daily.    vitamin B-12 (CYANOCOBALAMIN) 1000 MCG tablet Take 1,000 mcg by mouth daily.              Today   CHIEF COMPLAINT:  Patient is doing well this point. Blood pressure is improved. Patient denies vertigo. Patient denies any neurological deficits.   VITAL SIGNS:  Blood pressure 173/79, pulse 77, temperature 97.8 F (36.6 C), temperature source Oral, resp. rate 20, height 5\' 3"  (1.6 m), weight 62.188 kg (137 lb 1.6 oz), SpO2 97 %.  REVIEW OF SYSTEMS:  Review of Systems  Constitutional: Negative for fever, chills and malaise/fatigue.  HENT: Negative for sore throat.   Eyes: Negative for blurred vision.  Respiratory: Negative for cough, hemoptysis, shortness of breath and wheezing.   Cardiovascular: Negative for chest pain, palpitations and leg swelling.  Gastrointestinal: Negative for nausea, vomiting, abdominal pain, diarrhea and blood in stool.  Genitourinary: Negative for dysuria.  Musculoskeletal: Negative for back pain.   Neurological: Negative for dizziness, tremors and headaches.  Endo/Heme/Allergies: Does not bruise/bleed easily.     PHYSICAL EXAMINATION:  GENERAL:  81 y.o.-year-old patient lying in the bed with no acute distress.  NECK:  Supple, no jugular venous distention. No thyroid enlargement, no tenderness.  LUNGS: Normal breath sounds bilaterally, no wheezing, rales,rhonchi  No use of accessory muscles of respiration.  CARDIOVASCULAR: Irregular, irregular No murmurs, rubs, or gallops.  ABDOMEN: Soft, non-tender, non-distended. Bowel sounds present. No organomegaly or mass.  EXTREMITIES: No pedal edema, cyanosis, or clubbing.  PSYCHIATRIC: The patient is alert and oriented x 3.  SKIN: No obvious rash, lesion, or ulcer.   DATA REVIEW:   CBC  Recent Labs Lab 07/27/15 0416  WBC 5.5  HGB 10.5*  HCT 30.6*  PLT 277    Chemistries   Recent Labs Lab 07/27/15 0416  NA 141  K 3.9  CL 110  CO2 24  GLUCOSE 84  BUN 26*  CREATININE 1.41*  CALCIUM 8.5*    Cardiac Enzymes No results for input(s): TROPONINI in the last 168 hours.  Microbiology Results  @MICRORSLT48 @  RADIOLOGY:  Ct Head Wo Contrast  07/26/2015  CLINICAL DATA:  Dizziness, intermittent EXAM: CT HEAD WITHOUT CONTRAST TECHNIQUE: Contiguous axial images were obtained from the base of the skull through the vertex without intravenous contrast. COMPARISON:  Brain MRI July 10, 2009 FINDINGS: There is age related volume loss. There is no intracranial mass, hemorrhage, extra-axial fluid collection, or midline shift. There is small vessel disease throughout the centra semiovale bilaterally, stable. There is a prior small infarct in the anterior limb of the right external capsule. Elsewhere gray-white compartments appear unremarkable. No acute infarct apparent. The bony calvarium appears intact. The mastoid air cells are clear. Basal ganglia calcification is felt to be physiologic in this age group. Scattered small foci of  calcification in the tentorium may be physiologic as well in this age group. IMPRESSION: Extensive periventricular small vessel disease, stable. Prior small infarct in the anterior limb of the right external capsule. No acute infarct evident. No hemorrhage or mass effect. Electronically Signed   By: Lowella Grip III M.D.   On: 07/26/2015 12:10   US Carotid Bilateral  07/26/2015  CLINICAL DATA:  Stroke symptoms, hypertension, syncope, diabetes EXAM: BILATERAL CAROTID DUPLEX ULTRASOUND TECHNIQUE: Pearline Cables scale imaging, color Doppler and duplex ultrasound were performed of bilateral carotid and vertebral arteries in the neck. COMPARISON:  07/26/2015 CT head without contrast FINDINGS: Criteria: Quantification of carotid stenosis is based on velocity parameters that correlate the residual internal carotid diameter with NASCET-based stenosis levels, using the diameter of the distal internal carotid lumen as the denominator for stenosis measurement. The following velocity measurements were obtained: RIGHT ICA:  98/22 cm/sec CCA:  [id number] cm/sec SYSTOLIC ICA/CCA RATIO:  0.9 DIASTOLIC ICA/CCA RATIO:  1.5 ECA:  66 cm/sec LEFT ICA:  105/22 cm/sec CCA:  [id number] cm/sec SYSTOLIC ICA/CCA RATIO:  1.1 DIASTOLIC ICA/CCA RATIO:  2.0 ECA:  93 cm/sec RIGHT CAROTID ARTERY: Minor atherosclerotic plaque formation. No hemodynamically significant right ICA stenosis, velocity  elevation, or turbulent flow. Degree of narrowing less than 50%. RIGHT VERTEBRAL ARTERY:  Antegrade LEFT CAROTID ARTERY: Similar scattered minor atherosclerotic plaque formation. No hemodynamically significant left ICA stenosis, velocity elevation, or turbulent flow. LEFT VERTEBRAL ARTERY:  Antegrade IMPRESSION: Minor carotid atherosclerosis. No hemodynamically significant ICA stenosis. Degree of narrowing less than 50% bilaterally. Electronically Signed   By: Jerilynn Mages.  Shick M.D.   On: 07/26/2015 18:37      Management plans discussed with the patient and she is in  agreement. Stable for discharge home  Patient should follow up with PCP  CODE STATUS:     Code Status Orders        Start     Ordered   07/26/15 1355  Full code   Continuous     07/26/15 1354      TOTAL TIME TAKING CARE OF THIS PATIENT: 35 minutes.    Note: This dictation was prepared with Dragon dictation along with smaller phrase technology. Any transcriptional errors that result from this process are unintentional.  Zollie Clemence M.D on 07/27/2015 at 12:04 PM  Between 7am to 6pm - Pager - [phone] After 6pm go to [email or web address] - password [email or web address] [facility]  [facility]  Office  [phone]  CC: Primary care physician; No primary care provider on file.

## 2015-07-27 NOTE — Care Management (Signed)
Patient admitted from home with TIA.  Patient states that she obtains her medications from [facility] on [address].  Patient lives home alone.  She has 2 adult daughters and a sister local for support.  Patient states that she has no home equipment, and drives.  Per PT patient has no deficits and no recommendations for PT follow up.  MD has ordered home health RN.  Patient was provided with preference list.  [facility]  Referral called to [facility] and notified of pending discharge.  No further RNCM needs identified.  RNCM signing off

## 2015-07-27 NOTE — Progress Notes (Signed)
Patient given discharge teaching and paperwork regarding medications, diet, follow-up appointments and activity. Patient understanding verbalized. No complaints at this time. IV and telemetry discontinued prior to leaving. Skin assessment as previously charted and vitals are stable; on room air. Patient being discharged to home. Family present during discharge teaching. No further needs by Care Management. Prescriptions sent to pharmacy.

## 2015-09-01 DIAGNOSIS — I482 Chronic atrial fibrillation, unspecified: Secondary | ICD-10-CM | POA: Insufficient documentation

## 2015-11-01 DIAGNOSIS — N183 Chronic kidney disease, stage 3 (moderate): Secondary | ICD-10-CM

## 2015-11-01 DIAGNOSIS — E119 Type 2 diabetes mellitus without complications: Secondary | ICD-10-CM | POA: Insufficient documentation

## 2015-11-01 DIAGNOSIS — E1122 Type 2 diabetes mellitus with diabetic chronic kidney disease: Secondary | ICD-10-CM | POA: Insufficient documentation

## 2016-08-07 ENCOUNTER — Encounter: Payer: Self-pay | Admitting: *Deleted

## 2016-08-07 ENCOUNTER — Inpatient Hospital Stay
Admission: EM | Admit: 2016-08-07 | Discharge: 2016-08-09 | DRG: 378 | Disposition: A | Discharge status: Home / Self Care | Payer: Medicare Other | Attending: Internal Medicine | Admitting: Internal Medicine

## 2016-08-07 DIAGNOSIS — K259 Gastric ulcer, unspecified as acute or chronic, without hemorrhage or perforation: Secondary | ICD-10-CM | POA: Diagnosis not present

## 2016-08-07 DIAGNOSIS — I129 Hypertensive chronic kidney disease with stage 1 through stage 4 chronic kidney disease, or unspecified chronic kidney disease: Secondary | ICD-10-CM | POA: Diagnosis present

## 2016-08-07 DIAGNOSIS — Z79899 Other long term (current) drug therapy: Secondary | ICD-10-CM

## 2016-08-07 DIAGNOSIS — H5462 Unqualified visual loss, left eye, normal vision right eye: Secondary | ICD-10-CM | POA: Diagnosis not present

## 2016-08-07 DIAGNOSIS — E039 Hypothyroidism, unspecified: Secondary | ICD-10-CM | POA: Diagnosis not present

## 2016-08-07 DIAGNOSIS — D62 Acute posthemorrhagic anemia: Secondary | ICD-10-CM | POA: Diagnosis not present

## 2016-08-07 DIAGNOSIS — E785 Hyperlipidemia, unspecified: Secondary | ICD-10-CM | POA: Diagnosis present

## 2016-08-07 DIAGNOSIS — I4891 Unspecified atrial fibrillation: Secondary | ICD-10-CM | POA: Diagnosis present

## 2016-08-07 DIAGNOSIS — E1122 Type 2 diabetes mellitus with diabetic chronic kidney disease: Secondary | ICD-10-CM | POA: Diagnosis not present

## 2016-08-07 DIAGNOSIS — K921 Melena: Secondary | ICD-10-CM | POA: Diagnosis not present

## 2016-08-07 DIAGNOSIS — Z888 Allergy status to other drugs, medicaments and biological substances status: Secondary | ICD-10-CM

## 2016-08-07 DIAGNOSIS — K922 Gastrointestinal hemorrhage, unspecified: Secondary | ICD-10-CM | POA: Diagnosis present

## 2016-08-07 DIAGNOSIS — Z23 Encounter for immunization: Secondary | ICD-10-CM | POA: Diagnosis not present

## 2016-08-07 DIAGNOSIS — N189 Chronic kidney disease, unspecified: Secondary | ICD-10-CM | POA: Diagnosis present

## 2016-08-07 DIAGNOSIS — Z7982 Long term (current) use of aspirin: Secondary | ICD-10-CM | POA: Diagnosis not present

## 2016-08-07 LAB — URINALYSIS, COMPLETE (UACMP) WITH MICROSCOPIC
BILIRUBIN URINE: NEGATIVE
Bacteria, UA: NONE SEEN
Glucose, UA: NEGATIVE mg/dL
Hgb urine dipstick: NEGATIVE
Ketones, ur: NEGATIVE mg/dL
Nitrite: NEGATIVE
PH: 6 (ref 5.0–8.0)
Protein, ur: 30 mg/dL — AB
SPECIFIC GRAVITY, URINE: 1.008 (ref 1.005–1.030)

## 2016-08-07 LAB — GLUCOSE, CAPILLARY: GLUCOSE-CAPILLARY: 129 mg/dL — AB (ref 65–99)

## 2016-08-07 LAB — CBC
HCT: 20.2 % — ABNORMAL LOW (ref 35.0–47.0)
Hemoglobin: 6.6 g/dL — ABNORMAL LOW (ref 12.0–16.0)
MCH: 22.7 pg — AB (ref 26.0–34.0)
MCHC: 32.8 g/dL (ref 32.0–36.0)
MCV: 69.2 fL — AB (ref 80.0–100.0)
PLATELETS: 433 10*3/uL (ref 150–440)
RBC: 2.91 MIL/uL — ABNORMAL LOW (ref 3.80–5.20)
RDW: 18 % — AB (ref 11.5–14.5)
WBC: 5.7 10*3/uL (ref 3.6–11.0)

## 2016-08-07 LAB — BASIC METABOLIC PANEL
Anion gap: 7 (ref 5–15)
BUN: 25 mg/dL — AB (ref 6–20)
CALCIUM: 8.4 mg/dL — AB (ref 8.9–10.3)
CHLORIDE: 109 mmol/L (ref 101–111)
CO2: 22 mmol/L (ref 22–32)
CREATININE: 1.87 mg/dL — AB (ref 0.44–1.00)
GFR calc non Af Amer: 24 mL/min — ABNORMAL LOW (ref >60)
GFR, EST AFRICAN AMERICAN: 27 mL/min — AB (ref >60)
Glucose, Bld: 152 mg/dL — ABNORMAL HIGH (ref 65–99)
Potassium: 4.5 mmol/L (ref 3.5–5.1)
Sodium: 138 mmol/L (ref 135–145)

## 2016-08-07 LAB — HEMOGLOBIN: Hemoglobin: 7.5 g/dL — ABNORMAL LOW (ref 12.0–16.0)

## 2016-08-07 LAB — ABO/RH: ABO/RH(D): B POS

## 2016-08-07 LAB — PREPARE RBC (CROSSMATCH)

## 2016-08-07 MED ORDER — INSULIN ASPART 100 UNIT/ML ~~LOC~~ SOLN: 0.0000 [IU] | Freq: Three times a day (TID) | SUBCUTANEOUS | Status: DC

## 2016-08-07 MED ORDER — PANTOPRAZOLE SODIUM 40 MG IV SOLR
40.0000 mg | Freq: Once | INTRAVENOUS | Status: DC
  Filled 2016-08-07: qty 40

## 2016-08-07 MED ORDER — INFLUENZA VAC SPLIT QUAD 0.5 ML IM SUSY
0.5000 mL | PREFILLED_SYRINGE | INTRAMUSCULAR | Status: AC
  Administered 2016-08-08: 0.5 mL via INTRAMUSCULAR
  Filled 2016-08-07: qty 0.5

## 2016-08-07 MED ORDER — ATORVASTATIN CALCIUM 20 MG PO TABS
10.0000 mg | ORAL_TABLET | Freq: Every day | ORAL | Status: DC
  Administered 2016-08-08: 10 mg via ORAL
  Filled 2016-08-07 (×2): qty 1

## 2016-08-07 MED ORDER — SODIUM CHLORIDE 0.9 % IV SOLN
10.0000 mL/h | Freq: Once | INTRAVENOUS | Status: AC
  Administered 2016-08-07: 10 mL/h via INTRAVENOUS

## 2016-08-07 MED ORDER — HYDRALAZINE HCL 25 MG PO TABS
25.0000 mg | ORAL_TABLET | Freq: Three times a day (TID) | ORAL | Status: DC
  Administered 2016-08-08 – 2016-08-09 (×2): 25 mg via ORAL
  Filled 2016-08-07 (×3): qty 1

## 2016-08-07 MED ORDER — AMLODIPINE BESYLATE 5 MG PO TABS
5.0000 mg | ORAL_TABLET | Freq: Every day | ORAL | Status: DC
  Administered 2016-08-07 – 2016-08-09 (×3): 5 mg via ORAL
  Filled 2016-08-07 (×3): qty 1

## 2016-08-07 MED ORDER — PANTOPRAZOLE SODIUM 40 MG IV SOLR
40.0000 mg | Freq: Two times a day (BID) | INTRAVENOUS | Status: DC
  Administered 2016-08-07 – 2016-08-09 (×5): 40 mg via INTRAVENOUS
  Filled 2016-08-07 (×5): qty 40

## 2016-08-07 MED ORDER — LATANOPROST 0.005 % OP SOLN
1.0000 [drp] | Freq: Every day | OPHTHALMIC | Status: DC
  Administered 2016-08-07: 1 [drp] via OPHTHALMIC
  Filled 2016-08-07: qty 2.5

## 2016-08-07 MED ORDER — LISINOPRIL 20 MG PO TABS
40.0000 mg | ORAL_TABLET | Freq: Every day | ORAL | Status: DC
  Administered 2016-08-08 – 2016-08-09 (×2): 40 mg via ORAL
  Filled 2016-08-07 (×2): qty 2

## 2016-08-07 MED ORDER — VITAMIN B-12 1000 MCG PO TABS
1000.0000 ug | ORAL_TABLET | Freq: Every day | ORAL | Status: DC
  Administered 2016-08-07 – 2016-08-09 (×3): 1000 ug via ORAL
  Filled 2016-08-07 (×3): qty 1

## 2016-08-07 MED ORDER — LEVOTHYROXINE SODIUM 112 MCG PO TABS
112.0000 ug | ORAL_TABLET | Freq: Every day | ORAL | Status: DC
  Administered 2016-08-09: 112 ug via ORAL
  Filled 2016-08-07: qty 1

## 2016-08-07 MED ORDER — SODIUM CHLORIDE 0.9 % IV SOLN
INTRAVENOUS | Status: DC
  Administered 2016-08-07 – 2016-08-08 (×2): via INTRAVENOUS

## 2016-08-07 NOTE — Consult Note (Signed)
Patient is 82 y/o WF with hx of heme positive stool, anemia with hgb down to 6.6, she is getting  A second unit of blood now.  See note by PA [facility].  Plan to do EGD tomorrow, clear liquid tonight but no red or carbonated beverage. Possible causes are PUD, AVM of stomach, previous gastritis and duodenal polyp could be slowly bleeding.

## 2016-08-07 NOTE — ED Triage Notes (Addendum)
States she was sent by PCP from low blood count and for a blood transfusion, deneis any dizziness or SOB or pain, pt awake and alert , deneis any bloody vomit, states dark stools but states she takes an iron pill

## 2016-08-07 NOTE — ED Provider Notes (Signed)
Time Seen: Approximately1453 I have reviewed the triage notes  Chief Complaint: Abnormal Lab   History of Present Illness: Patricia Dudley is a 82 y.o. female *who was referred here due to an abnormal laboratory tests done as an outpatient. Patient was referred here due to anemia. She has a history of iron deficiency anemia and states she's noticed some dark stool but she thought it was secondary to the iron. Patient denies any feelings of lightheadedness, abdominal pain, shortness of breath, chest pain, etc. Patient's not on any aggressive blood thinners and denies nonsteroidal medication usage.   Past Medical History:  Diagnosis Date  . A-fib ([place])   . Diabetes mellitus without complication ([place])   . Hyperlipemia   . Hypertension   . Thyroid disease     Patient Active Problem List   Diagnosis Date Noted  . 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 Renal insufficiency 07/26/2015  . TIA (transient ischemic attack) 07/26/2015  . Vertigo 07/26/2015    Past Surgical History:  Procedure Laterality Date  . EYE SURGERY     left    Past Surgical History:  Procedure Laterality Date  . EYE SURGERY     left    Current Outpatient Rx  . Order #: [id number] Class: Historical Med  . Order #: [id number] Class: Historical Med  . Order #: [id number] Class: Historical Med  . Order #: [id number] Class: Historical Med  . Order #: [id number] Class: Normal  . Order #: [id number] Class: Historical Med  . Order #: [id number] Class: Historical Med  . Order #: [id number] Class: Normal  . Order #: [id number] Class: Historical Med    Allergies:  Iodine  Family History: History reviewed. No pertinent family history.  Social History: Social History  Substance Use Topics  . Smoking status: Never Smoker  . Smokeless tobacco: Not on file  . Alcohol use No     Review of Systems:   10 point review of systems was performed and was otherwise negative:  Constitutional: No fever Eyes: No  visual disturbances ENT: No sore throat, ear pain Cardiac: No chest pain Respiratory: No shortness of breath, wheezing, or stridor Abdomen: No abdominal pain, no vomiting, No diarrhea Endocrine: No weight loss, No night sweats Extremities: No peripheral edema, cyanosis Skin: No rashes, easy bruising Neurologic: No focal weakness, trouble with speech or swollowing Urologic: No dysuria, Hematuria, or urinary frequency   Physical Exam:  ED Triage Vitals  Enc Vitals Group     BP 08/07/16 1310 (!) 166/80     Pulse Rate 08/07/16 1310 85     Resp 08/07/16 1310 18     Temp 08/07/16 1310 98.2 F (36.8 C)     Temp Source 08/07/16 1310 Oral     SpO2 08/07/16 1310 100 %     Weight 08/07/16 1311 135 lb (61.2 kg)     Height 08/07/16 1311 5\' 5"  (1.651 m)     Head Circumference --      Peak Flow --      Pain Score --      Pain Loc --      Pain Edu? --      Excl. in [place]? --     General: Awake , Alert , and Oriented times 3; GCS 15 Head: Normal cephalic , atraumatic Eyes: Pupils equal , round, reactive to light Nose/Throat: No nasal drainage, patent upper airway without erythema or exudate.  Neck: Supple, Full range of motion, No anterior adenopathy or palpable thyroid masses Lungs: Clear to ascultation without wheezes ,  rhonchi, or rales Heart: Regular rate, regular rhythm without murmurs , gallops , or rubs Abdomen: Soft, non tender without rebound, guarding , or rigidity; bowel sounds positive and symmetric in all 4 quadrants. No organomegaly .        Extremities: 2 plus symmetric pulses. No edema, clubbing or cyanosis Neurologic: normal ambulation, Motor symmetric without deficits, sensory intact Skin: warm, dry, no rashes Rectal exam with chaperone present shows normal sphincter tone with no external hemorrhoids or bleeding. Patient is guaiac positive with normal appearing stool in the rectal vault no was no palpable masses.  Labs:   All laboratory work was reviewed including any  pertinent negatives or positives listed below:  Labs Reviewed  BASIC METABOLIC PANEL - Abnormal; Notable for the following:       Result Value   Glucose, Bld 152 (*)    BUN 25 (*)    Creatinine, Ser 1.87 (*)    Calcium 8.4 (*)    GFR calc non Af Amer 24 (*)    GFR calc Af Amer 27 (*)    All other components within normal limits  CBC - Abnormal; Notable for the following:    RBC 2.91 (*)    Hemoglobin 6.6 (*)    HCT 20.2 (*)    MCV 69.2 (*)    MCH 22.7 (*)    RDW 18.0 (*)    All other components within normal limits  URINALYSIS, COMPLETE (UACMP) WITH MICROSCOPIC  TYPE AND SCREEN  PREPARE RBC (CROSSMATCH)       Critical Care: * CRITICAL CARE Performed by: Daymon Larsen   Total critical care time: 33 minutes  Critical care time was exclusive of separately billable procedures and treating other patients.  Critical care was necessary to treat or prevent imminent or life-threatening deterioration.  Critical care was time spent personally by me on the following activities: development of treatment plan with patient and/or surrogate as well as nursing, discussions with consultants, evaluation of patient's response to treatment, examination of patient, obtaining history from patient or surrogate, ordering and performing treatments and interventions, ordering and review of laboratory studies, ordering and review of radiographic studies, pulse oximetry and re-evaluation of patient's condition. Critical care for initiation of blood transfusion therapy and initiation of workup and treatment for acute gastrointestinal bleeding    ED Course: * Patient's stay was uneventful and she is currently hemodynamically stable at this point. He was initiated on blood transfusion therapy was signed consent, etc. Risk factors, alternatives, etc. was explained at the bedside. Patient appears to be of understanding. She was also initiated on IV proton pump inhibitor therapy secondary to her  description of dark stool with anemia and being quite positive assumption was made for upper gastrointestinal bleed. Clinical Course      Assessment:  Anemia Gastrointestinal bleeding  Final Clinical Impression:   Final diagnoses:  Gastrointestinal hemorrhage, unspecified gastrointestinal hemorrhage type     Plan:  Inpatient           Daymon Larsen, MD 08/07/16 1456

## 2016-08-07 NOTE — ED Notes (Signed)
Pt ambulatory to toilet with no assistance.  

## 2016-08-07 NOTE — H&P (Signed)
[facility] [place] NAME: Patricia Dudley    MR#:  [id number]  DATE OF BIRTH:  11-07-1929  DATE OF ADMISSION:  08/07/2016  PRIMARY CARE PHYSICIAN: Tracie Harrier, MD   REQUESTING/REFERRING PHYSICIAN: Marcelene Butte  CHIEF COMPLAINT:   Chief Complaint  Patient presents with  . Abnormal Lab    HISTORY OF PRESENT ILLNESS: Patricia Dudley  is a 82 y.o. female with a known history of A. fib, diabetes, hyperlipidemia, hypertension, hypothyroidism- for last 3-4 weeks was feeling weak. She had a regular appointment scheduled with primary care physician next week and as a part of that she went for blood work in the office yesterday. She got a call thank you stating that her hemoglobin is low and she is to get blood transfusion so go to emergency room. Her hemoglobin was 6.2 and her stool guaiac was positive in the emergency room so she is ordered 2 units of transfusion and given his admission to hospitalist team. She takes aspirin every day as a part of her atrial fibrillation, but denies taking any over-the-counter pain medications or BC powders. She denies any abdominal pain or vomiting or nausea. On further checking of her old records she was referred to GI clinic for her anemia more than a year ago but with colonoscopy was fine in the past, was advised to go to oncology clinic. She is taking iron therapy at home.  PAST MEDICAL HISTORY:   Past Medical History:  Diagnosis Date  . A-fib ([place])   . Diabetes mellitus without complication ([place])   . Hyperlipemia   . Hypertension   . Thyroid disease     PAST SURGICAL HISTORY: Past Surgical History:  Procedure Laterality Date  . EYE SURGERY     left    SOCIAL HISTORY:  Social History  Substance Use Topics  . Smoking status: Never Smoker  . Smokeless tobacco: Not on file  . Alcohol use No    FAMILY HISTORY:  Family History  Problem Relation Age of Onset  . Diabetes Mother     DRUG  ALLERGIES:  Allergies  Allergen Reactions  . Iodine Rash    REVIEW OF SYSTEMS:   CONSTITUTIONAL: No fever,Positive for fatigue or weakness.  EYES: No blurred or double vision.  EARS, NOSE, AND THROAT: No tinnitus or ear pain.  RESPIRATORY: No cough, shortness of breath, wheezing or hemoptysis.  CARDIOVASCULAR: No chest pain, orthopnea, edema.  GASTROINTESTINAL: No nausea, vomiting, diarrhea or abdominal pain.  GENITOURINARY: No dysuria, hematuria.  ENDOCRINE: No polyuria, nocturia,  HEMATOLOGY: No anemia, easy bruising or bleeding SKIN: No rash or lesion. MUSCULOSKELETAL: No joint pain or arthritis.   NEUROLOGIC: No tingling, numbness, weakness.  PSYCHIATRY: No anxiety or depression.   MEDICATIONS AT HOME:  Prior to Admission medications   Medication Sig Start Date End Date Taking? Authorizing Provider  aspirin EC 81 MG tablet Take 81 mg by mouth at bedtime.    Historical Provider, MD  atorvastatin (LIPITOR) 10 MG tablet Take 10 mg by mouth at bedtime.    Historical Provider, MD  ferrous sulfate 325 (65 FE) MG EC tablet Take 325 mg by mouth daily.    Historical Provider, MD  glipiZIDE (GLUCOTROL XL) 10 MG 24 hr tablet Take 10 mg by mouth daily.    Historical Provider, MD  hydrALAZINE (APRESOLINE) 25 MG tablet Take 1 tablet (25 mg total) by mouth every 8 (eight) hours. 07/27/15   [name], MD  latanoprost (XALATAN) 0.005 % ophthalmic  solution Place 1 drop into both eyes at bedtime.    Historical Provider, MD  levothyroxine (SYNTHROID, LEVOTHROID) 112 MCG tablet Take 112 mcg by mouth daily.    Historical Provider, MD  lisinopril (PRINIVIL,ZESTRIL) 40 MG tablet Take 1 tablet (40 mg total) by mouth daily. 07/27/15   [name], MD  vitamin B-12 (CYANOCOBALAMIN) 1000 MCG tablet Take 1,000 mcg by mouth daily.    Historical Provider, MD      PHYSICAL EXAMINATION:   VITAL SIGNS: Blood pressure (!) 166/80, pulse 85, temperature 98.2 F (36.8 C), temperature source Oral, resp. rate 18,  height 5\' 5"  (1.651 m), weight 61.2 kg (135 lb), SpO2 100 %.  GENERAL:  82 y.o.-year-old patient lying in the bed with no acute distress.  EYES: Pupils equal, round, reactive to light and accommodation. No scleral icterus. Extraocular muscles intact. Conjunctiva pale. HEENT: Head atraumatic, normocephalic. Oropharynx and nasopharynx clear.  NECK:  Supple, no jugular venous distention. No thyroid enlargement, no tenderness.  LUNGS: Normal breath sounds bilaterally, no wheezing, rales,rhonchi or crepitation. No use of accessory muscles of respiration.  CARDIOVASCULAR: S1, S2 normal. No murmurs, rubs, or gallops.  ABDOMEN: Soft, nontender, nondistended. Bowel sounds present. No organomegaly or mass.  EXTREMITIES: No pedal edema, cyanosis, or clubbing.  NEUROLOGIC: Cranial nerves II through XII are intact. Muscle strength 5/5 in all extremities. Sensation intact. Gait not checked.  PSYCHIATRIC: The patient is alert and oriented x 3.  SKIN: No obvious rash, lesion, or ulcer.   LABORATORY PANEL:   CBC  Recent Labs Lab 08/07/16 1317  WBC 5.7  HGB 6.6*  HCT 20.2*  PLT 433  MCV 69.2*  MCH 22.7*  MCHC 32.8  RDW 18.0*   ------------------------------------------------------------------------------------------------------------------  Chemistries   Recent Labs Lab 08/07/16 1317  NA 138  K 4.5  CL 109  CO2 22  GLUCOSE 152*  BUN 25*  CREATININE 1.87*  CALCIUM 8.4*   ------------------------------------------------------------------------------------------------------------------ estimated creatinine clearance is 20.2 mL/min (by C-G formula based on SCr of 1.87 mg/dL (H)). ------------------------------------------------------------------------------------------------------------------ No results for input(s): TSH, T4TOTAL, T3FREE, THYROIDAB in the last 72 hours.  Invalid input(s): FREET3   Coagulation profile No results for input(s): INR, PROTIME in the last 168  hours. ------------------------------------------------------------------------------------------------------------------- No results for input(s): DDIMER in the last 72 hours. -------------------------------------------------------------------------------------------------------------------  Cardiac Enzymes No results for input(s): CKMB, TROPONINI, MYOGLOBIN in the last 168 hours.  Invalid input(s): CK ------------------------------------------------------------------------------------------------------------------ Invalid input(s): POCBNP  ---------------------------------------------------------------------------------------------------------------  Urinalysis    Component Value Date/Time   COLORURINE STRAW (A) 07/26/2015 1047   APPEARANCEUR CLEAR (A) 07/26/2015 1047   LABSPEC 1.009 07/26/2015 1047   PHURINE 6.0 07/26/2015 [place] 07/26/2015 1047   HGBUR NEGATIVE 07/26/2015 [place] 07/26/2015 1047   KETONESUR NEGATIVE 07/26/2015 1047   PROTEINUR 30 (A) 07/26/2015 1047   NITRITE NEGATIVE 07/26/2015 1047   LEUKOCYTESUR TRACE (A) 07/26/2015 1047     RADIOLOGY: No results found.  EKG: Orders placed or performed during the hospital encounter of 07/26/15  . ED EKG  . ED EKG    IMPRESSION AND PLAN:  * Symptomatically anemia   GI bleed    ER physician ordered 2 units of blood transfusion. Follow hemoglobin.   IV Protonix twice a day for GI bleed.   GI consult called in.   Liquid diet for now.   Hold aspirin and chemical anticoagulants for now.  *   Hypertension   Continue lisinopril.  * DM   Hold meds as on  liquid diet.   Keep on sliding scale insulin.  *  hypothyroidism    continue levothyroxine.  * Hyperlipidemia   Continue statin.     All the records are reviewed and case discussed with ED provider. Management plans discussed with the patient, family and they are in agreement.  CODE STATUS:full code  Code Status  History    Date Active Date Inactive Code Status Order ID Comments User Context   07/26/2015  1:55 PM 07/27/2015  6:32 PM Full Code [id number]  Theodoro Grist, MD Inpatient    Advance Directive Documentation   Flowsheet Row Most Recent Value  Type of Advance Directive  Healthcare Power of Attorney, Living will  Pre-existing out of facility DNR order (yellow form or pink MOST form)  No data  "MOST" Form in Place?  No data    Patient daughter is present in the room during my visit. She is power of attorney.    TOTAL TIME TAKING CARE OF THIS PATIENT: 50  minutes.    Vaughan Basta M.D on 08/07/2016   Between 7am to 6pm - Pager - [phone]  After 6pm go to [email or web address] - password [email or web address] [facility]  Office  [phone]  CC: Primary care physician; Tracie Harrier, MD   Note: This dictation was prepared with Dragon dictation along with smaller phrase technology. Any transcriptional errors that result from this process are unintentional.

## 2016-08-07 NOTE — Progress Notes (Signed)
Family Meeting Note  Advance Directive:yes  Today a meeting took place with the Patient. Daughter.   The following clinical team members were present during this meeting:MD  The following were discussed:Patient's diagnosis: gi bleed , anemia , Patient's progosis: Unable to determine and Goals for treatment: Continue present management full code  Additional follow-up to be provided: gi consult.  Time spent during discussion:20 minutes  Patricia Dudley, Patricia Macadamia, MD

## 2016-08-07 NOTE — Consult Note (Signed)
Consultation  Referring Provider:     Dr Marthann Schiller Admit date; 12.13.17 Consult date     08/07/16    Reason for Consultation: gib/anemia             HPI:   Patricia Dudley is a 82 y.o. female  With history of  Atrial fibrillation, IDA, CRI, DM, HTN, who was referred to hospital after anemia exacerbation with hgb of 6.6 on presentation to ED. She has been transfused today one unit prbc and is now receiving her 2nd. Was found to have guaiac + brown stool on rectal exam in ED by Dr Marcelene Butte.  Bun and creatinine somewhat elevated but close to baseline over the last year States she takes ASA 81mg  po qd. Denies NSAIDs and other anticoagulants. States stopped taking her iron supplements regularly but restarted a week or so ago. Takes B12 very irregularly. Has been started on IV pantoprazole bid.  Reports her primary symptom is fatigue. Had some chest tightness/DOE last week after taking out the recycling but none since. Reports some decreased appetite and early satiety. Had some gnawing periumbilical discomfort today that she attributes to hunger.  Denies problems with reflux, swallowing, other abdominal pain, melena/hematochezia, further GI related complaints. Denies bruising, nosebleeds, vaginal bleeding, bleeding gums, and further signs of obvious bleeding.  Last seen in the GI clinic for history of melena/IDA 08/08/15- was feeling well, had negative stool cards 07/14/15 and hgb stable at 10.5 12/16: did not want any further testing at that time so was referred to hematology however wanted to discuss with her PCP prior to consulting with them. She did not have eval with hematology after that.  PREVIOUS ENDOSCOPIES:            EGD and colonoscopy 10/12 by Dr Tiffany Kocher EGD with gastritis and duodenal polyp Colonoscopy with adenomatous polyps, internal hemorrhoids.    Past Medical History:  Diagnosis Date  . A-fib ([place])   . Diabetes mellitus without complication ([place])   . Hyperlipemia   .  Hypertension   . 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 EYE SURGERY     left    Family History  Problem Relation Age of Onset  . Diabetes Mother     Social History  Substance Use Topics  . Smoking status: Never Smoker  . Smokeless tobacco: Not on file  . Alcohol use No    Prior to Admission medications   Medication Sig Start Date End Date Taking? Authorizing Provider  aspirin EC 81 MG tablet Take 81 mg by mouth at bedtime.   Yes Historical Provider, MD  atorvastatin (LIPITOR) 10 MG tablet Take 10 mg by mouth daily.    Yes Historical Provider, MD  docusate sodium (COLACE) 100 MG capsule Take 100 mg by mouth daily as needed for mild constipation.   Yes Historical Provider, MD  ferrous sulfate 325 (65 FE) MG EC tablet Take 325 mg by mouth daily.   Yes Historical Provider, MD  glipiZIDE (GLUCOTROL XL) 10 MG 24 hr tablet Take 10 mg by mouth daily.   Yes Historical Provider, MD  latanoprost (XALATAN) 0.005 % ophthalmic solution Place 1 drop into both eyes at bedtime.   Yes Historical Provider, MD  levothyroxine (SYNTHROID, LEVOTHROID) 112 MCG tablet Take 112 mcg by mouth daily.   Yes Historical Provider, MD  lisinopril (PRINIVIL,ZESTRIL) 40 MG tablet Take 1 tablet (40 mg total) by mouth daily.  07/27/15  Yes [name], MD  vitamin B-12 (CYANOCOBALAMIN) 1000 MCG tablet Take 1,000 mcg by mouth daily.   Yes Historical Provider, MD    Current Facility-Administered Medications  Medication Dose Route Frequency Provider Last Rate Last Dose  . 0.9 %  sodium chloride infusion  10 mL/hr Intravenous Once Daymon Larsen, MD      . insulin aspart (novoLOG) injection 0-9 Units  0-9 Units Subcutaneous TID WC Vaughan Basta, MD      . pantoprazole (PROTONIX) injection 40 mg  40 mg Intravenous Q12H Vaughan Basta, MD   40 mg at 08/07/16 1505   Current Outpatient Prescriptions  Medication Sig Dispense Refill   . aspirin EC 81 MG tablet Take 81 mg by mouth at bedtime.    Marland Kitchen atorvastatin (LIPITOR) 10 MG tablet Take 10 mg by mouth daily.     Marland Kitchen docusate sodium (COLACE) 100 MG capsule Take 100 mg by mouth daily as needed for mild constipation.    . ferrous sulfate 325 (65 FE) MG EC tablet Take 325 mg by mouth daily.    Marland Kitchen glipiZIDE (GLUCOTROL XL) 10 MG 24 hr tablet Take 10 mg by mouth daily.    Marland Kitchen latanoprost (XALATAN) 0.005 % ophthalmic solution Place 1 drop into both eyes at bedtime.    Marland Kitchen levothyroxine (SYNTHROID, LEVOTHROID) 112 MCG tablet Take 112 mcg by mouth daily.    Marland Kitchen lisinopril (PRINIVIL,ZESTRIL) 40 MG tablet Take 1 tablet (40 mg total) by mouth daily. 30 tablet 0  . vitamin B-12 (CYANOCOBALAMIN) 1000 MCG tablet Take 1,000 mcg by mouth daily.      Allergies as of 08/07/2016 - Review Complete 08/07/2016  Allergen Reaction Noted  . Iodine Rash 07/26/2015     Review of Systems:    All systems reviewed and negative except where noted in HPI.      Physical Exam:  Vital signs in last 24 hours: Temp:  [98.2 F (36.8 C)] 98.2 F (36.8 C) (12/13 1310) Pulse Rate:  [85-104] 104 (12/13 1530) Resp:  [14-21] 21 (12/13 1530) BP: (153-166)/(73-80) 153/73 (12/13 1530) SpO2:  [100 %] 100 % (12/13 1530) Weight:  [61.2 kg (135 lb)] 61.2 kg (135 lb) (12/13 1311)   General:   Pleasant woman in NAD Head:  Normocephalic and atraumatic. Eyes:   No icterus.   Conjunctiva pale pink. Ears:  Normal auditory acuity. Mouth: Mucosa pink moist, no lesions. Neck:  Supple; no masses felt Lungs: Respirations even and unlabored. Lungs clear to auscultation bilaterally.   No wheezes, crackles, or rhonchi.  Heart:  S1S2, Rhythm irregular, no MRG. No edema. Abdomen:   Flat, soft, nondistended, nontender. Normal bowel sounds. No appreciable masses or hepatomegaly. No rebound signs or other peritoneal signs. Msk:  MAEW x4, No clubbing or cyanosis. Strength 5/5. Symmetrical without gross deformities. Neurologic:   Alert and  oriented x4;  Cranial nerves II-XII intact.  Skin:  Warm, dry, pink without significant lesions or rashes. Psych:  Alert and cooperative. Normal affect.  LAB RESULTS:  Recent Labs  08/07/16 1317 08/07/16 1517  WBC 5.7  --   HGB 6.6* 7.5*  HCT 20.2*  --   PLT 433  --    BMET  Recent Labs  08/07/16 1317  NA 138  K 4.5  CL 109  CO2 22  GLUCOSE 152*  BUN 25*  CREATININE 1.87*  CALCIUM 8.4*   LFT No results for input(s): PROT, ALBUMIN, AST, ALT, ALKPHOS, BILITOT, BILIDIR, IBILI in the last 72 hours. PT/INR  No results for input(s): LABPROT, INR in the last 72 hours.  STUDIES: No results found.     Impression / Plan:   1. Symptomatic anemia with heme positive stool- agree with ppi and blood transfusions. Will discuss endoscopic intervention with Dr Tiffany Kocher.  Thank you very much for this consult. These services were provided by Stephens November, NP-C, in collaboration with Lollie Sails, MD, with whom I have discussed this patient in full.   Stephens November, NP-C  Addendum: did discuss with Dr Tiffany Kocher- will plan for EGD as clinically feasible tomorrow

## 2016-08-07 NOTE — ED Notes (Signed)
Dr. Marcelene Butte at bedside. This Probation officer and [place], [place] also at bedside. Dr. Marcelene Butte performed rectal exam. Pt hemoccult positive.

## 2016-08-08 ENCOUNTER — Encounter
Admission: EM | Disposition: A | Discharge status: Home / Self Care | Payer: Self-pay | Source: Home / Self Care | Attending: Internal Medicine

## 2016-08-08 ENCOUNTER — Encounter: Payer: Self-pay | Admitting: *Deleted

## 2016-08-08 ENCOUNTER — Inpatient Hospital Stay: Payer: Medicare Other | Admitting: Anesthesiology

## 2016-08-08 DIAGNOSIS — K921 Melena: Secondary | ICD-10-CM | POA: Diagnosis not present

## 2016-08-08 DIAGNOSIS — K922 Gastrointestinal hemorrhage, unspecified: Secondary | ICD-10-CM | POA: Diagnosis not present

## 2016-08-08 HISTORY — PX: ESOPHAGOGASTRODUODENOSCOPY: SHX5428

## 2016-08-08 LAB — CBC
HEMATOCRIT: 28.7 % — AB (ref 35.0–47.0)
HEMOGLOBIN: 9.6 g/dL — AB (ref 12.0–16.0)
MCH: 24.4 pg — ABNORMAL LOW (ref 26.0–34.0)
MCHC: 33.3 g/dL (ref 32.0–36.0)
MCV: 73.2 fL — ABNORMAL LOW (ref 80.0–100.0)
Platelets: 344 10*3/uL (ref 150–440)
RBC: 3.92 MIL/uL (ref 3.80–5.20)
RDW: 19.8 % — ABNORMAL HIGH (ref 11.5–14.5)
WBC: 5.9 10*3/uL (ref 3.6–11.0)

## 2016-08-08 LAB — TYPE AND SCREEN
ABO/RH(D): B POS
Antibody Screen: NEGATIVE
UNIT DIVISION: 0
UNIT DIVISION: 0

## 2016-08-08 LAB — GLUCOSE, CAPILLARY
GLUCOSE-CAPILLARY: 237 mg/dL — AB (ref 65–99)
GLUCOSE-CAPILLARY: 110 mg/dL — AB (ref 65–99)
GLUCOSE-CAPILLARY: 239 mg/dL — AB (ref 65–99)
Glucose-Capillary: 66 mg/dL (ref 65–99)
Glucose-Capillary: 94 mg/dL (ref 65–99)

## 2016-08-08 LAB — BASIC METABOLIC PANEL
ANION GAP: 3 — AB (ref 5–15)
BUN: 18 mg/dL (ref 6–20)
CO2: 24 mmol/L (ref 22–32)
Calcium: 8.4 mg/dL — ABNORMAL LOW (ref 8.9–10.3)
Chloride: 113 mmol/L — ABNORMAL HIGH (ref 101–111)
Creatinine, Ser: 1.59 mg/dL — ABNORMAL HIGH (ref 0.44–1.00)
GFR calc Af Amer: 33 mL/min — ABNORMAL LOW (ref >60)
GFR, EST NON AFRICAN AMERICAN: 29 mL/min — AB (ref >60)
GLUCOSE: 63 mg/dL — AB (ref 65–99)
POTASSIUM: 4.4 mmol/L (ref 3.5–5.1)
Sodium: 140 mmol/L (ref 135–145)

## 2016-08-08 LAB — HEMOGLOBIN
Hemoglobin: 9.6 g/dL — ABNORMAL LOW (ref 12.0–16.0)
Hemoglobin: 10.4 g/dL — ABNORMAL LOW (ref 12.0–16.0)
Hemoglobin: 9.8 g/dL — ABNORMAL LOW (ref 12.0–16.0)

## 2016-08-08 SURGERY — EGD (ESOPHAGOGASTRODUODENOSCOPY)
Anesthesia: General

## 2016-08-08 MED ORDER — PROPOFOL 10 MG/ML IV BOLUS
INTRAVENOUS | Status: DC | PRN
  Administered 2016-08-08: 80 mg via INTRAVENOUS
  Administered 2016-08-08: 20 mg via INTRAVENOUS

## 2016-08-08 MED ORDER — DEXTROSE 50 % IV SOLN: 1.0000 | Freq: Once | INTRAVENOUS | Status: DC

## 2016-08-08 MED ORDER — DEXTROSE 50 % IV SOLN
INTRAVENOUS | Status: AC
  Filled 2016-08-08: qty 50

## 2016-08-08 NOTE — Op Note (Signed)
[facility] Gastroenterology Patient Name: Patricia Dudley Procedure Date: 08/08/2016 3:32 PM MRN: [id number] Account #: [id number] Date of Birth: 1930-03-08 Admit Type: Inpatient Age: 82 Room: [facility] ENDO ROOM 3 Gender: Female Note Status: Finalized Procedure:            Upper GI endoscopy Indications:          Iron deficiency anemia secondary to chronic blood loss,                        Melena, Occult blood in stool Providers:            [name], MD Referring MD:         Tracie Harrier, MD (Referring MD) Medicines:            Propofol per Anesthesia Complications:        No immediate complications. Procedure:            Pre-Anesthesia Assessment:                       - After reviewing the risks and benefits, the patient                        was deemed in satisfactory condition to undergo the                        procedure.                       After obtaining informed consent, the endoscope was                        passed under direct vision. Throughout the procedure,                        the patient's blood pressure, pulse, and oxygen                        saturations were monitored continuously. The Endoscope                        was introduced through the mouth, and advanced to the                        second part of duodenum. The upper GI endoscopy was                        accomplished without difficulty. The patient tolerated                        the procedure well. Findings:      The examined esophagus was normal. GEJ 39cm.      Five small non-bleeding cratered, linear and superficial gastric ulcers       with no stigmata of bleeding were found in the gastric antrum. The       largest lesion was 3-4 mm in largest dimension. No blood, no bleeding,      The examined duodenum was normal. Impression:           - Normal esophagus.                       -  Non-bleeding gastric ulcers with no stigmata of   bleeding.                       - Normal examined duodenum.                       - No specimens collected. Recommendation:       - The findings and recommendations were discussed with                        the patient's family. Use PPI, full liquid diet ok,                        could likely go home tomorrow since hgb came up to 10.                        Eat a very soft diet. [name], MD 08/08/2016 4:07:18 PM This report has been signed electronically. Number of Addenda: 0 Note Initiated On: 08/08/2016 3:32 PM      [facility]

## 2016-08-08 NOTE — Progress Notes (Signed)
Patricia Dudley at [place] NAME: Patricia Dudley    MR#:  [id number]  DATE OF BIRTH:  1929-12-07  SUBJECTIVE:  CHIEF COMPLAINT:   Chief Complaint  Patient presents with  . Abnormal Lab  waiting to get EGD when I saw, no complaints REVIEW OF SYSTEMS:  Review of Systems  Constitutional: Positive for malaise/fatigue. Negative for chills, fever and weight loss.  HENT: Negative for nosebleeds and sore throat.   Eyes: Negative for blurred vision.  Respiratory: Negative for cough, shortness of breath and wheezing.   Cardiovascular: Negative for chest pain, orthopnea, leg swelling and PND.  Gastrointestinal: Negative for abdominal pain, constipation, diarrhea, heartburn, nausea and vomiting.  Genitourinary: Negative for dysuria and urgency.  Musculoskeletal: Negative for back pain.  Skin: Negative for rash.  Neurological: Positive for weakness. Negative for dizziness, speech change, focal weakness and headaches.  Endo/Heme/Allergies: Does not bruise/bleed easily.  Psychiatric/Behavioral: Negative for depression.    DRUG ALLERGIES:   Allergies  Allergen Reactions  . Iodine Rash   VITALS:  Blood pressure (!) 152/93, pulse (!) 56, temperature 97 F (36.1 C), temperature source Tympanic, resp. rate (!) 21, height 5\' 5"  (1.651 m), weight 61.2 kg (135 lb), SpO2 100 %. PHYSICAL EXAMINATION:  Physical Exam  Constitutional: She is oriented to person, place, and time and well-developed, well-nourished, and in no distress.  HENT:  Head: Normocephalic and atraumatic.  Eyes: Conjunctivae and EOM are normal. Pupils are equal, round, and reactive to light.  Neck: Normal range of motion. Neck supple. No tracheal deviation present. No thyromegaly present.  Cardiovascular: Normal rate, regular rhythm and normal heart sounds.   Pulmonary/Chest: Effort normal and breath sounds normal. No respiratory distress. She has no wheezes. She exhibits no tenderness.    Abdominal: Soft. Bowel sounds are normal. She exhibits no distension. There is no tenderness.  Musculoskeletal: Normal range of motion.  Neurological: She is alert and oriented to person, place, and time. No cranial nerve deficit.  Skin: Skin is warm and dry. No rash noted.  Psychiatric: Mood and affect normal.   LABORATORY PANEL:   CBC  Recent Labs Lab 08/08/16 0519 08/08/16 0921  WBC 5.9  --   HGB 9.6* 10.4*  HCT 28.7*  --   PLT 344  --    ------------------------------------------------------------------------------------------------------------------ Chemistries   Recent Labs Lab 08/08/16 0519  NA 140  K 4.4  CL 113*  CO2 24  GLUCOSE 63*  BUN 18  CREATININE 1.59*  CALCIUM 8.4*   RADIOLOGY:  No results found. ASSESSMENT AND PLAN:  82 y.o. female with a known history of A. fib, diabetes, hyperlipidemia, hypertension, hypothyroidism admitted for unusual weakness and severe anemia  * Symptomatically anemia - appreciate GI input - s/p EGD on 12/14 showing 5 small scattered ulcers without stigmata of bleeding, no clots, scabs, eschar, etc. - s/p 2 PRBC transfusion and Hgb up after 2 units.  - start with full liquid diet and advance to soft diet as tolerated - Protonix twice a day for now - Hold aspirin and chemical anticoagulants for now.  *   Hypertension   Continue lisinopril.  * DM   Hold meds as on liquid diet.   Keep on sliding scale insulin.  *  hypothyroidism    continue levothyroxine.  * Hyperlipidemia   Continue statin.     All the records are reviewed and case discussed with Care Management/Social Worker. Management plans discussed with the patient, family and they are  in agreement.  CODE STATUS: FULL CODE  TOTAL TIME TAKING CARE OF THIS PATIENT: 35 minutes.   More than 50% of the time was spent in counseling/coordination of care: YES  POSSIBLE D/C IN am, DEPENDING ON CLINICAL CONDITION.   Max Sane M.D on 08/08/2016 at 4:55  PM  Between 7am to 6pm - Pager - [phone]  After 6pm go to [email or web address] - Proofreader  [facility] [place] [facility]  Office  [phone]  CC: Primary care physician; Tracie Harrier, MD  Note: This dictation was prepared with Dragon dictation along with smaller phrase technology. Any transcriptional errors that result from this process are unintentional.

## 2016-08-08 NOTE — Anesthesia Preprocedure Evaluation (Signed)
Anesthesia Evaluation  Patient identified by MRN, date of birth, ID band Patient awake    Reviewed: Allergy & Precautions, H&P , NPO status , Patient's Chart, lab work & pertinent test results, reviewed documented beta blocker date and time   Airway Mallampati: II   Neck ROM: full    Dental  (+) Poor Dentition, Partial Upper, Partial Lower   Pulmonary neg pulmonary ROS,    Pulmonary exam normal        Cardiovascular hypertension, On Medications negative cardio ROS Normal cardiovascular examAtrial Fibrillation  Rhythm:regular Rate:Normal     Neuro/Psych TIAnegative neurological ROS  negative psych ROS   GI/Hepatic negative GI ROS, Neg liver ROS,   Endo/Other  negative endocrine ROSdiabetes  Renal/GU Renal diseasenegative Renal ROS  negative genitourinary   Musculoskeletal   Abdominal   Peds  Hematology negative hematology ROS (+)   Anesthesia Other Findings Past Medical History: No date: A-fib ([place]) No date: Diabetes mellitus without complication (HCC) No date: Hyperlipemia No date: Hypertension No date: Thyroid disease Past Surgical History: No date: EYE SURGERY     Comment: left BMI    Body Mass Index:  22.47 kg/m     Reproductive/Obstetrics negative OB ROS                             Anesthesia Physical Anesthesia Plan  ASA: III  Anesthesia Plan: General   Post-op Pain Management:    Induction:   Airway Management Planned:   Additional Equipment:   Intra-op Plan:   Post-operative Plan:   Informed Consent: I have reviewed the patients History and Physical, chart, labs and discussed the procedure including the risks, benefits and alternatives for the proposed anesthesia with the patient or authorized representative who has indicated his/her understanding and acceptance.   Dental Advisory Given  Plan Discussed with: CRNA  Anesthesia Plan Comments:          Anesthesia Quick Evaluation

## 2016-08-08 NOTE — Transfer of Care (Signed)
Immediate Anesthesia Transfer of Care Note  Patient: Patricia Dudley  Procedure(s) Performed: Procedure(s): ESOPHAGOGASTRODUODENOSCOPY (EGD) (N/A)  Patient Location: PACU  Anesthesia Type:General  Level of Consciousness: patient cooperative and lethargic  Airway & Oxygen Therapy: Patient Spontanous Breathing and Patient connected to face mask oxygen  Post-op Assessment: Report given to RN and Post -op Vital signs reviewed and stable  Post vital signs: Reviewed and stable  Last Vitals:  Vitals:   08/08/16 1509 08/08/16 1607  BP: (!) 163/87 (!) 152/70  Pulse: 76 86  Resp: 18 (!) 22  Temp: (!) 36 C     Last Pain:  Vitals:   08/08/16 1509  TempSrc: Tympanic  PainSc: 0-No pain      Patients Stated Pain Goal: 0 (46/27/03 5009)  Complications: No apparent anesthesia complications

## 2016-08-08 NOTE — Consult Note (Signed)
Patient with 5 small scattered ulcers without stigmata of bleeding, no clots, scabs, eschar, etc.  Hgb up after 2 units.  I think she has been slowly bleeding from these findings and she can likely go home tomorrow on iron, PPI, usual meds, start with full liquid diet and after a few days advance to soft diet.

## 2016-08-08 NOTE — Plan of Care (Signed)
Problem: Fluid Volume: Goal: Ability to maintain a balanced intake and output will improve Outcome: Not Progressing Patient is NPO.  Problem: Nutrition: Goal: Adequate nutrition will be maintained Outcome: Not Progressing Patient is NPO.

## 2016-08-09 ENCOUNTER — Encounter: Payer: Self-pay | Admitting: Unknown Physician Specialty

## 2016-08-09 DIAGNOSIS — K922 Gastrointestinal hemorrhage, unspecified: Secondary | ICD-10-CM | POA: Diagnosis present

## 2016-08-09 DIAGNOSIS — K921 Melena: Secondary | ICD-10-CM | POA: Diagnosis not present

## 2016-08-09 DIAGNOSIS — Z23 Encounter for immunization: Secondary | ICD-10-CM | POA: Diagnosis not present

## 2016-08-09 LAB — TSH: TSH: 0.847 u[IU]/mL (ref 0.350–4.500)

## 2016-08-09 LAB — GLUCOSE, CAPILLARY: Glucose-Capillary: 92 mg/dL (ref 65–99)

## 2016-08-09 MED ORDER — PANTOPRAZOLE SODIUM 40 MG PO TBEC: 40.0000 mg | DELAYED_RELEASE_TABLET | Freq: Two times a day (BID) | ORAL | 0 refills | Status: DC

## 2016-08-09 NOTE — Discharge Instructions (Signed)
Peptic Ulcer A peptic ulcer is a painful sore in the lining of your esophagus, stomach, or in the first part of your small intestine. The main causes of an ulcer can be:  An infection.  Using certain pain medicines too often or too much.  Smoking. HOME CARE  Avoid smoking, alcohol, and caffeine.  Avoid foods that bother you.  Only take medicine as told by your doctor. Do not take any medicines your doctor has not approved.  Keep all doctor visits as told. GET HELP IF:  You do not get better in 7 days after starting treatment.  You keep having an upset stomach (indigestion) or heartburn. GET HELP RIGHT AWAY IF:  You have sudden, sharp, or lasting belly (abdominal) pain.  You have bloody, black, or tarry poop (stool).  You throw up (vomit) blood or your throw up looks like coffee grounds.  You get light-headed, weak, or feel like you will pass out (faint).  You get sweaty or feel sticky and cold to the touch (clammy). MAKE SURE YOU:   Understand these instructions.  Will watch your condition.  Will get help right away if you are not doing well or get worse. This information is not intended to replace advice given to you by your health care provider. Make sure you discuss any questions you have with your health care provider. Document Released: 11/06/2009 Document Revised: 09/02/2014 Document Reviewed: 05/13/2015 Elsevier Interactive Patient Education  2017 [employer].

## 2016-08-09 NOTE — Care Management Important Message (Signed)
Important Message  Patient Details  Name: Patricia Dudley MRN: [id number] Date of Birth: 16-Apr-1930   Medicare Important Message Given:  N/A - LOS <3 / Initial given by admissions    Katrina Stack, RN 08/09/2016, 8:50 AM

## 2016-08-09 NOTE — Care Management (Signed)
spoke with attending and informed there are no discharge needs for this patient

## 2016-08-09 NOTE — Progress Notes (Signed)
Pt A and O x 4. VSS. Pt tolerating diet well. No complaints of pain or nausea. IV removed intact, prescriptions given. Pt voiced understanding of discharge instructions with no further questions. Pt discharged via wheelchair with axillary.   

## 2016-08-12 NOTE — Discharge Summary (Signed)
[facility] NAME: Patricia Dudley    MR#:  [id number]  DATE OF BIRTH:  [birth year]  DATE OF ADMISSION:  08/07/2016   ADMITTING PHYSICIAN: Vaughan Basta, MD  DATE OF DISCHARGE: 08/09/2016 11:29 AM  PRIMARY CARE PHYSICIAN: Tracie Harrier, MD   ADMISSION DIAGNOSIS:  Gastrointestinal hemorrhage, unspecified gastrointestinal hemorrhage type [K92.2] DISCHARGE DIAGNOSIS:  Principal Problem:   GI bleed  SECONDARY DIAGNOSIS:   Past Medical History:  Diagnosis Date  . A-fib ([place])   . Diabetes mellitus without complication ([place])   . Hyperlipemia   . Hypertension   . Thyroid disease    HOSPITAL COURSE:  82 y.o.femalewith a known history of A. fib, diabetes, hyperlipidemia, hypertension, hypothyroidism admitted for unusual weakness and severe anemia  * Symptomatically anemia - s/p EGD on 12/14 showing 5 small scattered ulcers without stigmata of bleeding, no clots, scabs, eschar, etc. - s/p 2 PRBC transfusion and Hgb up after 2 units.  -Tolerated diet - Protonix twice a day for now  * Hypertension Continue lisinopril.  * DM controlled  * hypothyroidism  continue levothyroxine.  * Hyperlipidemia Continue statin  DISCHARGE CONDITIONS:  STABLE CONSULTS OBTAINED:  Treatment Team:  [name], MD DRUG ALLERGIES:   Allergies  Allergen Reactions  . Iodine Rash   DISCHARGE MEDICATIONS:   Allergies as of 08/09/2016      Reactions   Iodine Rash      Medication List    TAKE these medications   aspirin EC 81 MG tablet Take 81 mg by mouth at bedtime.   atorvastatin 10 MG tablet Commonly known as:  LIPITOR Take 10 mg by mouth daily.   docusate sodium 100 MG capsule Commonly known as:  COLACE Take 100 mg by mouth daily as needed for mild constipation.   ferrous sulfate 325 (65 FE) MG EC tablet Take 325 mg by mouth daily.   glipiZIDE 10 MG 24 hr tablet Commonly known as:   GLUCOTROL XL Take 10 mg by mouth daily.   latanoprost 0.005 % ophthalmic solution Commonly known as:  XALATAN Place 1 drop into both eyes at bedtime.   levothyroxine 112 MCG tablet Commonly known as:  SYNTHROID, LEVOTHROID Take 112 mcg by mouth daily.   lisinopril 40 MG tablet Commonly known as:  PRINIVIL,ZESTRIL Take 1 tablet (40 mg total) by mouth daily.   pantoprazole 40 MG tablet Commonly known as:  PROTONIX Take 1 tablet (40 mg total) by mouth 2 (two) times daily.   vitamin B-12 1000 MCG tablet Commonly known as:  CYANOCOBALAMIN Take 1,000 mcg by mouth daily.        DISCHARGE INSTRUCTIONS:   DIET:  Regular diet DISCHARGE CONDITION:  Good ACTIVITY:  Activity as tolerated OXYGEN:  Home Oxygen: No.  Oxygen Delivery: room air DISCHARGE LOCATION:  home   If you experience worsening of your admission symptoms, develop shortness of breath, life threatening emergency, suicidal or homicidal thoughts you must seek medical attention immediately by calling 911 or calling your MD immediately  if symptoms less severe.  You Must read complete instructions/literature along with all the possible adverse reactions/side effects for all the Medicines you take and that have been prescribed to you. Take any new Medicines after you have completely understood and accpet all the possible adverse reactions/side effects.   Please note  You were cared for by a hospitalist during your hospital stay. If you have any questions about your discharge medications or the care you  received while you were in the hospital after you are discharged, you can call the unit and asked to speak with the hospitalist on call if the hospitalist that took care of you is not available. Once you are discharged, your primary care physician will handle any further medical issues. Please note that NO REFILLS for any discharge medications will be authorized once you are discharged, as it is imperative that you return to  your primary care physician (or establish a relationship with a primary care physician if you do not have one) for your aftercare needs so that they can reassess your need for medications and monitor your lab values.    On the day of Discharge:  VITAL SIGNS:  Blood pressure (!) 143/63, pulse 61, temperature 97.6 F (36.4 C), temperature source Oral, resp. rate 20, height 5\' 5"  (1.651 m), weight 61.2 kg (135 lb), SpO2 98 %. PHYSICAL EXAMINATION:  GENERAL:  82 y.o.-year-old patient lying in the bed with no acute distress.  EYES: Pupils equal, round, reactive to light and accommodation. No scleral icterus. Extraocular muscles intact.  HEENT: Head atraumatic, normocephalic. Oropharynx and nasopharynx clear.  NECK:  Supple, no jugular venous distention. No thyroid enlargement, no tenderness.  LUNGS: Normal breath sounds bilaterally, no wheezing, rales,rhonchi or crepitation. No use of accessory muscles of respiration.  CARDIOVASCULAR: S1, S2 normal. No murmurs, rubs, or gallops.  ABDOMEN: Soft, non-tender, non-distended. Bowel sounds present. No organomegaly or mass.  EXTREMITIES: No pedal edema, cyanosis, or clubbing.  NEUROLOGIC: Cranial nerves II through XII are intact. Muscle strength 5/5 in all extremities. Sensation intact. Gait not checked.  PSYCHIATRIC: The patient is alert and oriented x 3.  SKIN: No obvious rash, lesion, or ulcer.  DATA REVIEW:   CBC  Recent Labs Lab 08/08/16 0519  08/08/16 1814  WBC 5.9  --   --   HGB 9.6*  < > 9.8*  HCT 28.7*  --   --   PLT 344  --   --   < > = values in this interval not displayed.  Chemistries   Recent Labs Lab 08/08/16 0519  NA 140  K 4.4  CL 113*  CO2 24  GLUCOSE 63*  BUN 18  CREATININE 1.59*  CALCIUM 8.4*     Follow-up Information    HANDE,VISHWANATH, MD. Go on 08/16/2016.   Specialty:  Internal Medicine Why:  Friday at 12:45pm for hospital follow Contact information: [address] [place] [address] [phone]           Management plans discussed with the patient, family and they are in agreement.  CODE STATUS: FULL CODE  TOTAL TIME TAKING CARE OF THIS PATIENT: 45 minutes.    Max Sane M.D on 08/12/2016 at 12:30 PM  Between 7am to 6pm - Pager - [phone]  After 6pm go to [email or web address] - Proofreader  [facility]  Office  [phone]  CC: Primary care physician; Tracie Harrier, MD   Note: This dictation was prepared with Dragon dictation along with smaller phrase technology. Any transcriptional errors that result from this process are unintentional.

## 2016-08-16 NOTE — Anesthesia Postprocedure Evaluation (Signed)
Anesthesia Post Note  Patient: Patricia Dudley  Procedure(s) Performed: Procedure(s) (LRB): ESOPHAGOGASTRODUODENOSCOPY (EGD) (N/A)  Patient location during evaluation: PACU Anesthesia Type: General Level of consciousness: awake and alert Pain management: pain level controlled Vital Signs Assessment: post-procedure vital signs reviewed and stable Respiratory status: spontaneous breathing, nonlabored ventilation, respiratory function stable and patient connected to nasal cannula oxygen Cardiovascular status: blood pressure returned to baseline and stable Postop Assessment: no signs of nausea or vomiting Anesthetic complications: no     Last Vitals:  Vitals:   08/09/16 0653 08/09/16 0957  BP: (!) 142/57 (!) 143/63  Pulse: (!) 40 61  Resp: 20   Temp: 36.4 C     Last Pain:  Vitals:   08/09/16 0653  TempSrc: Oral  PainSc:                  Molli Barrows

## 2016-12-18 ENCOUNTER — Emergency Department: Payer: Medicare Other

## 2016-12-18 ENCOUNTER — Observation Stay (HOSPITAL_COMMUNITY)
Admission: EM | Admit: 2016-12-18 | Discharge: 2016-12-19 | Disposition: A | Discharge status: Home / Self Care | Payer: Medicare Other | Source: Home / Self Care | Attending: Emergency Medicine | Admitting: Emergency Medicine

## 2016-12-18 ENCOUNTER — Observation Stay (HOSPITAL_BASED_OUTPATIENT_CLINIC_OR_DEPARTMENT_OTHER)
Admit: 2016-12-18 | Discharge: 2016-12-18 | Disposition: A | Discharge status: Home / Self Care | Payer: Medicare Other | Attending: Specialist | Admitting: Specialist

## 2016-12-18 ENCOUNTER — Encounter: Payer: Self-pay | Admitting: *Deleted

## 2016-12-18 DIAGNOSIS — E039 Hypothyroidism, unspecified: Secondary | ICD-10-CM

## 2016-12-18 DIAGNOSIS — R4781 Slurred speech: Secondary | ICD-10-CM | POA: Insufficient documentation

## 2016-12-18 DIAGNOSIS — E785 Hyperlipidemia, unspecified: Secondary | ICD-10-CM

## 2016-12-18 DIAGNOSIS — Z833 Family history of diabetes mellitus: Secondary | ICD-10-CM

## 2016-12-18 DIAGNOSIS — G459 Transient cerebral ischemic attack, unspecified: Secondary | ICD-10-CM | POA: Diagnosis present

## 2016-12-18 DIAGNOSIS — G458 Other transient cerebral ischemic attacks and related syndromes: Secondary | ICD-10-CM | POA: Insufficient documentation

## 2016-12-18 DIAGNOSIS — Z7982 Long term (current) use of aspirin: Secondary | ICD-10-CM | POA: Insufficient documentation

## 2016-12-18 DIAGNOSIS — Z79899 Other long term (current) drug therapy: Secondary | ICD-10-CM | POA: Insufficient documentation

## 2016-12-18 DIAGNOSIS — I639 Cerebral infarction, unspecified: Secondary | ICD-10-CM | POA: Diagnosis not present

## 2016-12-18 DIAGNOSIS — Z888 Allergy status to other drugs, medicaments and biological substances status: Secondary | ICD-10-CM

## 2016-12-18 DIAGNOSIS — Z7984 Long term (current) use of oral hypoglycemic drugs: Secondary | ICD-10-CM | POA: Insufficient documentation

## 2016-12-18 DIAGNOSIS — Z66 Do not resuscitate: Secondary | ICD-10-CM

## 2016-12-18 DIAGNOSIS — I10 Essential (primary) hypertension: Secondary | ICD-10-CM | POA: Insufficient documentation

## 2016-12-18 DIAGNOSIS — H409 Unspecified glaucoma: Secondary | ICD-10-CM

## 2016-12-18 DIAGNOSIS — K219 Gastro-esophageal reflux disease without esophagitis: Secondary | ICD-10-CM | POA: Insufficient documentation

## 2016-12-18 DIAGNOSIS — I160 Hypertensive urgency: Secondary | ICD-10-CM | POA: Diagnosis not present

## 2016-12-18 DIAGNOSIS — Z823 Family history of stroke: Secondary | ICD-10-CM | POA: Insufficient documentation

## 2016-12-18 DIAGNOSIS — R200 Anesthesia of skin: Secondary | ICD-10-CM

## 2016-12-18 DIAGNOSIS — E119 Type 2 diabetes mellitus without complications: Secondary | ICD-10-CM

## 2016-12-18 LAB — DIFFERENTIAL
BASOS PCT: 1 %
Basophils Absolute: 0.1 10*3/uL (ref 0–0.1)
EOS PCT: 4 %
Eosinophils Absolute: 0.3 10*3/uL (ref 0–0.7)
LYMPHS PCT: 25 %
Lymphs Abs: 1.8 10*3/uL (ref 1.0–3.6)
MONO ABS: 0.5 10*3/uL (ref 0.2–0.9)
Monocytes Relative: 7 %
NEUTROS PCT: 63 %
Neutro Abs: 4.6 10*3/uL (ref 1.4–6.5)

## 2016-12-18 LAB — CBC
HCT: 34.9 % — ABNORMAL LOW (ref 35.0–47.0)
Hemoglobin: 11.6 g/dL — ABNORMAL LOW (ref 12.0–16.0)
MCH: 25.5 pg — AB (ref 26.0–34.0)
MCHC: 33.2 g/dL (ref 32.0–36.0)
MCV: 77 fL — AB (ref 80.0–100.0)
PLATELETS: 332 10*3/uL (ref 150–440)
RBC: 4.54 MIL/uL (ref 3.80–5.20)
RDW: 18.6 % — AB (ref 11.5–14.5)
WBC: 7.2 10*3/uL (ref 3.6–11.0)

## 2016-12-18 LAB — COMPREHENSIVE METABOLIC PANEL
ALBUMIN: 3.5 g/dL (ref 3.5–5.0)
ALT: 13 U/L — ABNORMAL LOW (ref 14–54)
ANION GAP: 9 (ref 5–15)
AST: 21 U/L (ref 15–41)
Alkaline Phosphatase: 74 U/L (ref 38–126)
BUN: 20 mg/dL (ref 6–20)
CHLORIDE: 104 mmol/L (ref 101–111)
CO2: 27 mmol/L (ref 22–32)
Calcium: 8.4 mg/dL — ABNORMAL LOW (ref 8.9–10.3)
Creatinine, Ser: 1.74 mg/dL — ABNORMAL HIGH (ref 0.44–1.00)
GFR calc non Af Amer: 26 mL/min — ABNORMAL LOW (ref >60)
GFR, EST AFRICAN AMERICAN: 30 mL/min — AB (ref >60)
GLUCOSE: 231 mg/dL — AB (ref 65–99)
POTASSIUM: 3.4 mmol/L — AB (ref 3.5–5.1)
SODIUM: 140 mmol/L (ref 135–145)
Total Bilirubin: 0.9 mg/dL (ref 0.3–1.2)
Total Protein: 7.4 g/dL (ref 6.5–8.1)

## 2016-12-18 LAB — GLUCOSE, CAPILLARY: GLUCOSE-CAPILLARY: 213 mg/dL — AB (ref 65–99)

## 2016-12-18 LAB — APTT: aPTT: 34 seconds (ref 24–36)

## 2016-12-18 LAB — TROPONIN I: Troponin I: <0.03 ng/mL (ref <0.03)

## 2016-12-18 LAB — PROTIME-INR
INR: 1.06
PROTHROMBIN TIME: 13.8 s (ref 11.4–15.2)

## 2016-12-18 MED ORDER — SUCRALFATE 1 G PO TABS
1.0000 g | ORAL_TABLET | Freq: Four times a day (QID) | ORAL | Status: DC
  Filled 2016-12-18 (×4): qty 1

## 2016-12-18 MED ORDER — GLIPIZIDE ER 5 MG PO TB24: 10.0000 mg | ORAL_TABLET | Freq: Every day | ORAL | Status: DC

## 2016-12-18 MED ORDER — HYDRALAZINE HCL 20 MG/ML IJ SOLN: 10.0000 mg | Freq: Four times a day (QID) | INTRAMUSCULAR | Status: DC | PRN

## 2016-12-18 MED ORDER — LISINOPRIL 20 MG PO TABS
40.0000 mg | ORAL_TABLET | Freq: Every day | ORAL | Status: DC
  Administered 2016-12-19: 08:00:00 40 mg via ORAL
  Filled 2016-12-18: qty 2

## 2016-12-18 MED ORDER — LABETALOL HCL 5 MG/ML IV SOLN
INTRAVENOUS | Status: AC
  Administered 2016-12-18: 10 mg
  Filled 2016-12-18: qty 4

## 2016-12-18 MED ORDER — ASPIRIN 81 MG PO CHEW
324.0000 mg | CHEWABLE_TABLET | Freq: Once | ORAL | Status: AC
  Administered 2016-12-18: 324 mg via ORAL
  Filled 2016-12-18: qty 4

## 2016-12-18 MED ORDER — HYDROXYZINE HCL 25 MG PO TABS
25.0000 mg | ORAL_TABLET | Freq: Every evening | ORAL | Status: DC
  Administered 2016-12-19: 25 mg via ORAL
  Filled 2016-12-18 (×2): qty 1

## 2016-12-18 MED ORDER — ONDANSETRON HCL 4 MG/2ML IJ SOLN: 4.0000 mg | Freq: Four times a day (QID) | INTRAMUSCULAR | Status: DC | PRN

## 2016-12-18 MED ORDER — LATANOPROST 0.005 % OP SOLN
1.0000 [drp] | Freq: Every day | OPHTHALMIC | Status: DC
  Administered 2016-12-18: 20:00:00 1 [drp] via OPHTHALMIC
  Filled 2016-12-18: qty 2.5

## 2016-12-18 MED ORDER — ONDANSETRON HCL 4 MG PO TABS: 4.0000 mg | ORAL_TABLET | Freq: Four times a day (QID) | ORAL | Status: DC | PRN

## 2016-12-18 MED ORDER — ACETAMINOPHEN 650 MG RE SUPP: 650.0000 mg | Freq: Four times a day (QID) | RECTAL | Status: DC | PRN

## 2016-12-18 MED ORDER — POLYSACCHARIDE IRON COMPLEX 150 MG PO CAPS
150.0000 mg | ORAL_CAPSULE | Freq: Every day | ORAL | Status: DC
  Administered 2016-12-18 – 2016-12-19 (×2): 150 mg via ORAL
  Filled 2016-12-18 (×2): qty 1

## 2016-12-18 MED ORDER — ENOXAPARIN SODIUM 30 MG/0.3ML ~~LOC~~ SOLN
30.0000 mg | SUBCUTANEOUS | Status: DC
  Administered 2016-12-18: 20:00:00 30 mg via SUBCUTANEOUS
  Filled 2016-12-18: qty 0.3

## 2016-12-18 MED ORDER — SODIUM CHLORIDE 0.9% FLUSH
3.0000 mL | Freq: Two times a day (BID) | INTRAVENOUS | Status: DC
  Administered 2016-12-18 – 2016-12-19 (×2): 3 mL via INTRAVENOUS

## 2016-12-18 MED ORDER — KETOROLAC TROMETHAMINE 30 MG/ML IJ SOLN
INTRAMUSCULAR | Status: AC
  Administered 2016-12-18: 16:00:00
  Filled 2016-12-18: qty 1

## 2016-12-18 MED ORDER — ATORVASTATIN CALCIUM 20 MG PO TABS
10.0000 mg | ORAL_TABLET | Freq: Every day | ORAL | Status: DC
  Administered 2016-12-19: 10 mg via ORAL
  Filled 2016-12-18: qty 1

## 2016-12-18 MED ORDER — LEVOTHYROXINE SODIUM 112 MCG PO TABS
112.0000 ug | ORAL_TABLET | Freq: Every day | ORAL | Status: DC
  Administered 2016-12-19: 08:00:00 112 ug via ORAL
  Filled 2016-12-18 (×2): qty 1

## 2016-12-18 MED ORDER — ACETAMINOPHEN 325 MG PO TABS: 650.0000 mg | ORAL_TABLET | Freq: Four times a day (QID) | ORAL | Status: DC | PRN

## 2016-12-18 MED ORDER — ONDANSETRON HCL 4 MG/2ML IJ SOLN
INTRAMUSCULAR | Status: AC
  Administered 2016-12-18: 16:00:00
  Filled 2016-12-18: qty 2

## 2016-12-18 MED ORDER — ASPIRIN EC 81 MG PO TBEC: 81.0000 mg | DELAYED_RELEASE_TABLET | Freq: Every day | ORAL | Status: DC

## 2016-12-18 NOTE — Progress Notes (Signed)
   [facility] Day: 0 days Patricia Dudley is a 83 y.o. female presenting with Numbness .   Advance care planning discussed with patient  with additional Family at bedside. All questions in regards to overall condition and expected prognosis answered. The decision was made to continue current code status  CODE STATUS: dnr Time spent: 15 minutes

## 2016-12-18 NOTE — ED Notes (Signed)
Tried to call report. Patients blood pressure was too high. Will discuss with admitting doctor.

## 2016-12-18 NOTE — Progress Notes (Signed)
Anticoagulation monitoring(Lovenox):  83 yo female ordered Lovenox 40 mg Q24h  Filed Weights   12/18/16 1328  Weight: 138 lb (62.6 kg)   BMI    Lab Results  Component Value Date   CREATININE 1.74 (H) 12/18/2016   CREATININE 1.59 (H) 08/08/2016   CREATININE 1.87 (H) 08/07/2016   Estimated Creatinine Clearance: 22.5 mL/min (A) (by C-G formula based on SCr of 1.74 mg/dL (H)). Hemoglobin & Hematocrit     Component Value Date/Time   HGB 11.6 (L) 12/18/2016 1347   HCT 34.9 (L) 12/18/2016 1347     Per Protocol for Patient with estCrcl < 30 ml/min and BMI < 40, will transition to Lovenox 30 mg Q24h.

## 2016-12-18 NOTE — H&P (Signed)
[facility] [place] NAME: Patricia Dudley    MR#:  [id number]  DATE OF BIRTH:  02-Mar-1930  DATE OF ADMISSION:  12/18/2016  PRIMARY CARE PHYSICIAN: Tracie Harrier, MD   REQUESTING/REFERRING PHYSICIAN: Dr. Lenise Arena  CHIEF COMPLAINT:   Chief Complaint  Patient presents with  . Numbness    HISTORY OF PRESENT ILLNESS:  Patricia Dudley  is a 83 y.o. female with a known history of Atrial fibrillation, diabetes, hypertension, hyperlipidemia who presents to the hospital due to right arm and hand numbness along with some slurred speech. As per the daughter who gives most of the history patient earlier this afternoon developed some right hand numbness which then progressed to the entire right arm and then she also had some right leg tingling and numbness. She also complained of a headache along with the numbness, and also had some slight slurred speech as per the daughter. She was brought to the ER for further evaluation. Her numbness on the right side has improved and her slurred speech has also slightly improved but is not back to baseline yet. Given her transient neurologic symptoms hospitalist services were contacted further treatment and evaluation.  PAST MEDICAL HISTORY:   Past Medical History:  Diagnosis Date  . A-fib ([place])   . Diabetes mellitus without complication ([place])   . Hyperlipemia   . Hypertension   . Thyroid disease     PAST SURGICAL HISTORY:   Past Surgical History:  Procedure Laterality Date  . ESOPHAGOGASTRODUODENOSCOPY N/A 08/08/2016   Procedure: ESOPHAGOGASTRODUODENOSCOPY (EGD);  Surgeon: [name], MD;  Location: [facility] ENDOSCOPY;  Service: Endoscopy;  Laterality: N/A;  . EYE SURGERY     left    SOCIAL HISTORY:   Social History  Substance Use Topics  . Smoking status: Never Smoker  . Smokeless tobacco: Never Used  . Alcohol use No    FAMILY HISTORY:   Family History  Problem Relation Age of  Onset  . Diabetes Mother   . CVA Mother   . Hypertension Mother   . Diabetes Sister   . Diabetes Brother     DRUG ALLERGIES:   Allergies  Allergen Reactions  . Iodine Rash    REVIEW OF SYSTEMS:   Review of Systems  Constitutional: Negative for fever and weight loss.  HENT: Negative for congestion, nosebleeds and tinnitus.   Eyes: Negative for blurred vision, double vision and redness.  Respiratory: Negative for cough, hemoptysis and shortness of breath.   Cardiovascular: Negative for chest pain, orthopnea, leg swelling and PND.  Gastrointestinal: Negative for abdominal pain, diarrhea, melena, nausea and vomiting.  Genitourinary: Negative for dysuria, hematuria and urgency.  Musculoskeletal: Negative for falls and joint pain.  Neurological: Positive for tingling, sensory change and speech change. Negative for dizziness, focal weakness, seizures, weakness and headaches.  Endo/Heme/Allergies: Negative for polydipsia. Does not bruise/bleed easily.  Psychiatric/Behavioral: Negative for depression and memory loss. The patient is not nervous/anxious.     MEDICATIONS AT HOME:   Prior to Admission medications   Medication Sig Start Date End Date Taking? Authorizing Provider  aspirin EC 81 MG tablet Take 81 mg by mouth at bedtime.   Yes Historical Provider, MD  docusate sodium (COLACE) 100 MG capsule Take 100 mg by mouth daily as needed for mild constipation.   Yes Historical Provider, MD  glipiZIDE (GLUCOTROL XL) 10 MG 24 hr tablet Take 10 mg by mouth daily.   Yes Historical Provider, MD  hydrOXYzine (ATARAX/VISTARIL) 25  MG tablet Take 25 mg by mouth every evening.    Yes Historical Provider, MD  iron polysaccharides (NIFEREX) 150 MG capsule Take 150 mg by mouth daily.   Yes Historical Provider, MD  latanoprost (XALATAN) 0.005 % ophthalmic solution Place 1 drop into both eyes at bedtime.   Yes Historical Provider, MD  levothyroxine (SYNTHROID, LEVOTHROID) 112 MCG tablet Take 112 mcg  by mouth daily.   Yes Historical Provider, MD  lisinopril (PRINIVIL,ZESTRIL) 40 MG tablet Take 1 tablet (40 mg total) by mouth daily. 07/27/15  Yes [name], MD  vitamin B-12 (CYANOCOBALAMIN) 1000 MCG tablet Take 1,000 mcg by mouth daily.   Yes Historical Provider, MD  atorvastatin (LIPITOR) 10 MG tablet Take 10 mg by mouth daily.     Historical Provider, MD  pantoprazole (PROTONIX) 40 MG tablet Take 1 tablet (40 mg total) by mouth 2 (two) times daily. Patient not taking: Reported on 12/18/2016 08/09/16   Max Sane, MD  sucralfate (CARAFATE) 1 g tablet Take 1 g by mouth 4 (four) times daily.    Historical Provider, MD      VITAL SIGNS:  Blood pressure (!) 168/102, pulse 64, temperature 98.2 F (36.8 C), resp. rate 18, height 5\' 6"  (1.676 m), weight 62.6 kg (138 lb), SpO2 99 %.  PHYSICAL EXAMINATION:  Physical Exam  GENERAL:  83 y.o.-year-old patient lying in the bed with no acute distress.  EYES: Pupils equal, round, reactive to light and accommodation. No scleral icterus. Extraocular muscles intact.  HEENT: Head atraumatic, normocephalic. Oropharynx and nasopharynx clear. No oropharyngeal erythema, moist oral mucosa  NECK:  Supple, no jugular venous distention. No thyroid enlargement, no tenderness.  LUNGS: Normal breath sounds bilaterally, no wheezing, rales, rhonchi. No use of accessory muscles of respiration.  CARDIOVASCULAR: S1, S2 RRR. No murmurs, rubs, gallops, clicks.  ABDOMEN: Soft, nontender, nondistended. Bowel sounds present. No organomegaly or mass.  EXTREMITIES: No pedal edema, cyanosis, or clubbing. + 2 pedal & radial pulses b/l.   NEUROLOGIC: Cranial nerves II through XII are intact. No focal Motor or sensory deficits appreciated b/l PSYCHIATRIC: The patient is alert and oriented x 3.  SKIN: No obvious rash, lesion, or ulcer.   LABORATORY PANEL:   CBC  Recent Labs Lab 12/18/16 1347  WBC 7.2  HGB 11.6*  HCT 34.9*  PLT 332    ------------------------------------------------------------------------------------------------------------------  Chemistries   Recent Labs Lab 12/18/16 1347  NA 140  K 3.4*  CL 104  CO2 27  GLUCOSE 231*  BUN 20  CREATININE 1.74*  CALCIUM 8.4*  AST 21  ALT 13*  ALKPHOS 74  BILITOT 0.9   ------------------------------------------------------------------------------------------------------------------  Cardiac Enzymes  Recent Labs Lab 12/18/16 1347  TROPONINI <0.03   ------------------------------------------------------------------------------------------------------------------  RADIOLOGY:  Ct Head Code Stroke Wo Contrast`  Result Date: 12/18/2016 CLINICAL DATA:  Code stroke. Right-sided numbness which began 4 hours ago. EXAM: CT HEAD WITHOUT CONTRAST TECHNIQUE: Contiguous axial images were obtained from the base of the skull through the vertex without intravenous contrast. COMPARISON:  07/26/2015 FINDINGS: Brain: Age related atrophy. Extensive chronic small vessel ischemic changes throughout the cerebral hemispheric deep and subcortical white matter. No sign of acute infarction, mass lesion, hemorrhage, hydrocephalus or extra-axial collection. Vascular: There is atherosclerotic calcification of the major vessels at the base of the brain. Skull: Negative Sinuses/Orbits: Some inflammatory changes of the left sphenoid sinus. Opacified left posterior ethmoid air cell. Other sinuses clear. Orbits negative. Other: None ASPECTS ([place] Stroke Program Early CT Score) - Ganglionic level infarction (caudate,  lentiform nuclei, internal capsule, insula, M1-M3 cortex): 7 - Supraganglionic infarction (M4-M6 cortex): 3 Total score (0-10 with 10 being normal): 10 IMPRESSION: 1. No acute infarction. Atrophy an extensive chronic small vessel ischemic changes throughout the brain. 2. ASPECTS is 10. These results were called by telephone at the time of interpretation on 12/18/2016 at 1:54 pm to  Dr. Lenise Arena , who verbally acknowledged these results. Electronically Signed   By: Nelson Chimes M.D.   On: 12/18/2016 13:56     IMPRESSION AND PLAN:   83 year old female with past medical history of hypertension, hyperlipidemia, diabetes, GERD, hypothyroidism, glaucoma who presented to the hospital due to right sided numbness and also noted to have some slurred speech.  1. Right extremity numbness/slurred speech-suspected to be secondary to a TIA. Patient's neurologic symptoms have pretty much resolved and she is close to baseline. -Her CT head is negative for any acute pathology. I will get a MRI of the brain, carotid duplex, echocardiogram. -Continue baby aspirin, atorvastatin, a neurology consult.  2. Diabetes type 2 without complication-continue glipizide.  3. Essential hypertension-continue lisinopril, and we'll add some as needed hydralazine but tolerates some element of accelerated hypertension given suspected acute CVA.  4. Glaucoma-continue latanoprost eyedrops.  5. GERD-continue Protonix.  6. Hypothyroidism-continue Synthroid.    All the records are reviewed and case discussed with ED provider. Management plans discussed with the patient, family and they are in agreement.  CODE STATUS: DO NOT RESUSCITATE  TOTAL TIME TAKING CARE OF THIS PATIENT: 40 minutes.    Henreitta Leber M.D on 12/18/2016 at 3:31 PM  Between 7am to 6pm - Pager - [phone]  After 6pm go to [email or web address] - password [email or web address] [facility]  Office  [phone]  CC: Primary care physician; Tracie Harrier, MD

## 2016-12-18 NOTE — ED Provider Notes (Signed)
[facility] Emergency Department Provider Note       Time seen: ----------------------------------------- 2:15 PM on 12/18/2016 -----------------------------------------     I have reviewed the triage vital signs and the nursing notes.   HISTORY   Chief Complaint Numbness    HPI Patricia Dudley is a 83 y.o. female who presents to the ED for right hand numbness that went up her arm. Patient states she woke about 8 AM and felt normal, daughter noticed that she has some slurred speech. Patient states currently she does not have any speech difficulty she has some paresthesias in the right side but denies any focal deficits. She noticed the right hand numbness around 10 or 11 AM when she was cooking eggs. She denies a history of this before. Nothing makes it better or worse.   Past Medical History:  Diagnosis Date  . A-fib ([place])   . Diabetes mellitus without complication ([place])   . Hyperlipemia   . Hypertension   . Thyroid disease     Patient Active Problem List   Diagnosis Date Noted  . GI bleed 08/07/2016  . 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 Renal insufficiency 07/26/2015  . TIA (transient ischemic attack) 07/26/2015  . Vertigo 07/26/2015    Past Surgical History:  Procedure Laterality Date  . ESOPHAGOGASTRODUODENOSCOPY N/A 08/08/2016   Procedure: ESOPHAGOGASTRODUODENOSCOPY (EGD);  Surgeon: [name], MD;  Location: [facility] ENDOSCOPY;  Service: Endoscopy;  Laterality: N/A;  . EYE SURGERY     left    Allergies Iodine  Social History Social History  Substance Use Topics  . Smoking status: Never Smoker  . Smokeless tobacco: Never Used  . Alcohol use No    Review of Systems Constitutional: Negative for fever. Eyes: Negative for vision changes ENT:  Negative for congestion, sore throat Cardiovascular: Negative for chest pain. Respiratory: Negative for shortness of breath. Gastrointestinal: Negative for abdominal  pain, vomiting and diarrhea. Genitourinary: Negative for dysuria. Musculoskeletal: Negative for back pain. Skin: Negative for rash. Neurological: Positive for right hand numbness, speech disturbance  All systems negative/normal/unremarkable except as stated in the HPI  ____________________________________________   PHYSICAL EXAM:  VITAL SIGNS: ED Triage Vitals  Enc Vitals Group     BP 12/18/16 1332 (!) 168/102     Pulse Rate 12/18/16 1332 64     Resp 12/18/16 1332 18     Temp 12/18/16 1332 98.6 F (37 C)     Temp Source 12/18/16 1332 Oral     SpO2 12/18/16 1332 99 %     Weight 12/18/16 1328 138 lb (62.6 kg)     Height 12/18/16 1328 5\' 6"  (1.676 m)     Head Circumference --      Peak Flow --      Pain Score 12/18/16 1328 0     Pain Loc --      Pain Edu? --      Excl. in [place]? --     Constitutional: Alert and oriented. Well appearing and in no distress. Eyes: Conjunctivae are normal. PERRL. Normal extraocular movements. ENT   Head: Normocephalic and atraumatic.   Nose: No congestion/rhinnorhea.   Mouth/Throat: Mucous membranes are moist.   Neck: No stridor. Cardiovascular: Normal rate, regular rhythm. No murmurs, rubs, or gallops. Respiratory: Normal respiratory effort without tachypnea nor retractions. Breath sounds are clear and equal bilaterally. No wheezes/rales/rhonchi. Gastrointestinal: Soft and nontender. Normal bowel sounds Musculoskeletal: Nontender with normal range of motion in extremities. No lower extremity tenderness nor edema.  Neurologic:  No gross focal neurologic deficits are appreciated. Strength, sensation, cranial nerves appear to be normal. No focal deficits are appreciated at this time. She does have occasional slurred speech but this seems to clear. Skin:  Skin is warm, dry and intact. No rash noted. Psychiatric: Mood and affect are normal. Speech and behavior are normal.  ____________________________________________  EKG: Interpreted by  me.Sinus rhythm with marked sinus arrhythmia, rate is 75 bpm, LVH, normal QRS width, normal QT.  ____________________________________________  ED COURSE:  Pertinent labs & imaging results that were available during my care of the patient were reviewed by me and considered in my medical decision making (see chart for details). Patient presents for numbness and speech disturbance, we will assess with labs and imaging as indicated.   Procedures ____________________________________________   LABS (pertinent positives/negatives)  Labs Reviewed  CBC - Abnormal; Notable for the following:       Result Value   Hemoglobin 11.6 (*)    HCT 34.9 (*)    MCV 77.0 (*)    MCH 25.5 (*)    RDW 18.6 (*)    All other components within normal limits  COMPREHENSIVE METABOLIC PANEL - Abnormal; Notable for the following:    Potassium 3.4 (*)    Glucose, Bld 231 (*)    Creatinine, Ser 1.74 (*)    Calcium 8.4 (*)    ALT 13 (*)    GFR calc non Af Amer 26 (*)    GFR calc Af Amer 30 (*)    All other components within normal limits  GLUCOSE, CAPILLARY - Abnormal; Notable for the following:    Glucose-Capillary 213 (*)    All other components within normal limits  PROTIME-INR  APTT  DIFFERENTIAL  TROPONIN I  CBG MONITORING, ED    RADIOLOGY  CT head is unremarkable  ____________________________________________  FINAL ASSESSMENT AND PLAN  TIA  Plan: Patient's labs and imaging were dictated above. Patient had presented for right-sided numbness as well as speech disturbance. Clinically her symptoms have improved at this time. She is on a baby aspirin but is high risk for CVA. We will continue full aspirin and discussed with hospitalist for admission.   Earleen Newport, MD   Note: This note was generated in part or whole with voice recognition software. Voice recognition is usually quite accurate but there are transcription errors that can and very often do occur. I apologize for any  typographical errors that were not detected and corrected.     Earleen Newport, MD 12/18/16 [phone]

## 2016-12-18 NOTE — ED Notes (Signed)
Pt presents from home after having numbness in her right hand and arm at approximately 10-11 AM. Daughter reports that she also had difficulty speaking as well as drooling during that time. She indicates that it is mostly resolved, but that her right side feels "weird."

## 2016-12-18 NOTE — ED Triage Notes (Addendum)
States between 1030-11 am this morning while cooking breakfast she developed right hand numbness that went up her arm, states she woke up about 8 am and felt normal, daughter states she noticed some slurred speech, at present pt states resolved speech, states right the tingling is still present but not as bad, awake and alert

## 2016-12-18 NOTE — Progress Notes (Signed)
Chaplain prayed with patient's daughter while patient was at Cath Lab. Chaplain told patient's daughter that he was around if needed.   12/18/16 1400  Clinical Encounter Type  Visited With Family  Visit Type Initial  Referral From Nurse  Consult/Referral To Chaplain  Spiritual Encounters  Spiritual Needs Prayer

## 2016-12-18 NOTE — ED Notes (Signed)
IV is positional

## 2016-12-18 NOTE — ED Notes (Signed)
Spoke with admitting doctor. Verbal order for labetalol 10mg  iv. Repeated order to admitting doctor.

## 2016-12-19 ENCOUNTER — Inpatient Hospital Stay
Admission: EM | Admit: 2016-12-19 | Discharge: 2016-12-20 | DRG: 066 | Disposition: A | Discharge status: Home / Self Care | Payer: Medicare Other | Attending: Internal Medicine | Admitting: Internal Medicine

## 2016-12-19 ENCOUNTER — Observation Stay: Payer: Medicare Other

## 2016-12-19 ENCOUNTER — Emergency Department: Payer: Medicare Other

## 2016-12-19 DIAGNOSIS — E785 Hyperlipidemia, unspecified: Secondary | ICD-10-CM | POA: Diagnosis present

## 2016-12-19 DIAGNOSIS — I10 Essential (primary) hypertension: Secondary | ICD-10-CM | POA: Diagnosis present

## 2016-12-19 DIAGNOSIS — Z91041 Radiographic dye allergy status: Secondary | ICD-10-CM

## 2016-12-19 DIAGNOSIS — E039 Hypothyroidism, unspecified: Secondary | ICD-10-CM | POA: Diagnosis present

## 2016-12-19 DIAGNOSIS — Z79899 Other long term (current) drug therapy: Secondary | ICD-10-CM

## 2016-12-19 DIAGNOSIS — R4781 Slurred speech: Secondary | ICD-10-CM

## 2016-12-19 DIAGNOSIS — Z66 Do not resuscitate: Secondary | ICD-10-CM | POA: Diagnosis present

## 2016-12-19 DIAGNOSIS — R200 Anesthesia of skin: Secondary | ICD-10-CM | POA: Diagnosis not present

## 2016-12-19 DIAGNOSIS — I160 Hypertensive urgency: Secondary | ICD-10-CM | POA: Diagnosis present

## 2016-12-19 DIAGNOSIS — Z7982 Long term (current) use of aspirin: Secondary | ICD-10-CM

## 2016-12-19 DIAGNOSIS — Z7984 Long term (current) use of oral hypoglycemic drugs: Secondary | ICD-10-CM

## 2016-12-19 DIAGNOSIS — R29701 NIHSS score 1: Secondary | ICD-10-CM | POA: Diagnosis present

## 2016-12-19 DIAGNOSIS — I480 Paroxysmal atrial fibrillation: Secondary | ICD-10-CM | POA: Diagnosis present

## 2016-12-19 DIAGNOSIS — E1136 Type 2 diabetes mellitus with diabetic cataract: Secondary | ICD-10-CM | POA: Diagnosis present

## 2016-12-19 DIAGNOSIS — I639 Cerebral infarction, unspecified: Principal | ICD-10-CM | POA: Diagnosis present

## 2016-12-19 HISTORY — DX: Cerebral infarction, unspecified: I63.9

## 2016-12-19 LAB — ECHOCARDIOGRAM COMPLETE
Height: 66 in
WEIGHTICAEL: 2208 [oz_av]

## 2016-12-19 LAB — CBC
HCT: 31.2 % — ABNORMAL LOW (ref 35.0–47.0)
Hemoglobin: 10.5 g/dL — ABNORMAL LOW (ref 12.0–16.0)
MCH: 25.8 pg — ABNORMAL LOW (ref 26.0–34.0)
MCHC: 33.7 g/dL (ref 32.0–36.0)
MCV: 76.5 fL — ABNORMAL LOW (ref 80.0–100.0)
PLATELETS: 282 10*3/uL (ref 150–440)
RBC: 4.08 MIL/uL (ref 3.80–5.20)
RDW: 18.3 % — ABNORMAL HIGH (ref 11.5–14.5)
WBC: 5.3 10*3/uL (ref 3.6–11.0)

## 2016-12-19 LAB — CBC WITH DIFFERENTIAL/PLATELET
Basophils Absolute: 0 10*3/uL (ref 0–0.1)
Basophils Relative: 1 %
Eosinophils Absolute: 0.3 10*3/uL (ref 0–0.7)
Eosinophils Relative: 4 %
HEMATOCRIT: 35.8 % (ref 35.0–47.0)
Hemoglobin: 12 g/dL (ref 12.0–16.0)
LYMPHS PCT: 18 %
Lymphs Abs: 1.2 10*3/uL (ref 1.0–3.6)
MCH: 25.4 pg — ABNORMAL LOW (ref 26.0–34.0)
MCHC: 33.4 g/dL (ref 32.0–36.0)
MCV: 76 fL — AB (ref 80.0–100.0)
MONO ABS: 0.6 10*3/uL (ref 0.2–0.9)
MONOS PCT: 9 %
NEUTROS ABS: 4.6 10*3/uL (ref 1.4–6.5)
Neutrophils Relative %: 68 %
Platelets: 297 10*3/uL (ref 150–440)
RBC: 4.71 MIL/uL (ref 3.80–5.20)
RDW: 18.4 % — AB (ref 11.5–14.5)
WBC: 6.7 10*3/uL (ref 3.6–11.0)

## 2016-12-19 LAB — COMPREHENSIVE METABOLIC PANEL
ALT: 12 U/L — ABNORMAL LOW (ref 14–54)
AST: 19 U/L (ref 15–41)
Albumin: 3.3 g/dL — ABNORMAL LOW (ref 3.5–5.0)
Alkaline Phosphatase: 73 U/L (ref 38–126)
Anion gap: 8 (ref 5–15)
BILIRUBIN TOTAL: 0.8 mg/dL (ref 0.3–1.2)
BUN: 27 mg/dL — AB (ref 6–20)
CALCIUM: 8.6 mg/dL — AB (ref 8.9–10.3)
CO2: 27 mmol/L (ref 22–32)
CREATININE: 1.78 mg/dL — AB (ref 0.44–1.00)
Chloride: 104 mmol/L (ref 101–111)
GFR calc Af Amer: 29 mL/min — ABNORMAL LOW (ref >60)
GFR, EST NON AFRICAN AMERICAN: 25 mL/min — AB (ref >60)
Glucose, Bld: 165 mg/dL — ABNORMAL HIGH (ref 65–99)
POTASSIUM: 3.7 mmol/L (ref 3.5–5.1)
Sodium: 139 mmol/L (ref 135–145)
TOTAL PROTEIN: 7.2 g/dL (ref 6.5–8.1)

## 2016-12-19 LAB — TROPONIN I

## 2016-12-19 LAB — BASIC METABOLIC PANEL
Anion gap: 6 (ref 5–15)
BUN: 20 mg/dL (ref 6–20)
CALCIUM: 8.1 mg/dL — AB (ref 8.9–10.3)
CO2: 27 mmol/L (ref 22–32)
CREATININE: 1.48 mg/dL — AB (ref 0.44–1.00)
Chloride: 109 mmol/L (ref 101–111)
GFR calc Af Amer: 36 mL/min — ABNORMAL LOW (ref >60)
GFR, EST NON AFRICAN AMERICAN: 31 mL/min — AB (ref >60)
GLUCOSE: 109 mg/dL — AB (ref 65–99)
Potassium: 3.6 mmol/L (ref 3.5–5.1)
SODIUM: 142 mmol/L (ref 135–145)

## 2016-12-19 LAB — GLUCOSE, CAPILLARY: GLUCOSE-CAPILLARY: 94 mg/dL (ref 65–99)

## 2016-12-19 MED ORDER — SUCRALFATE 1 G PO TABS
1.0000 g | ORAL_TABLET | Freq: Three times a day (TID) | ORAL | Status: DC
  Administered 2016-12-19 (×3): 1 g via ORAL
  Filled 2016-12-19 (×4): qty 1

## 2016-12-19 MED ORDER — ATORVASTATIN CALCIUM 10 MG PO TABS: 10.0000 mg | ORAL_TABLET | Freq: Every day | ORAL | 0 refills | Status: DC

## 2016-12-19 MED ORDER — ASPIRIN EC 325 MG PO TBEC: 325.0000 mg | DELAYED_RELEASE_TABLET | Freq: Every day | ORAL | 0 refills | Status: DC

## 2016-12-19 MED ORDER — GLIPIZIDE ER 5 MG PO TB24
10.0000 mg | ORAL_TABLET | Freq: Every day | ORAL | Status: DC
  Administered 2016-12-19: 08:00:00 10 mg via ORAL
  Filled 2016-12-19: qty 2

## 2016-12-19 MED ORDER — HYDRALAZINE HCL 20 MG/ML IJ SOLN
10.0000 mg | Freq: Once | INTRAMUSCULAR | Status: AC
  Administered 2016-12-19: 10 mg via INTRAVENOUS
  Filled 2016-12-19: qty 1

## 2016-12-19 MED ORDER — LISINOPRIL 10 MG PO TABS
40.0000 mg | ORAL_TABLET | Freq: Once | ORAL | Status: DC
  Filled 2016-12-19: qty 4

## 2016-12-19 NOTE — ED Notes (Signed)
Pt returned to ED Rm 19 from CT at this time.

## 2016-12-19 NOTE — Progress Notes (Signed)
Pt has been discharged home. Discharge papers given and explained to pt. Pt verbalized understanding. Meds and f/u appointments reviewed with pt. RX sent to pharmacy. Pt escorted in a wheelchair.

## 2016-12-19 NOTE — Progress Notes (Signed)
While rounding, Hasty made initial visit to room 105. The patient was in bed and very pleasant. Daughter was bedside. Daughter stated that the Pt was markedly improved and very complementary of the care the Pt has received. [place] engaged the Pt with conversation surrounding her family. (10 children) Pt stated that her children are a tremendous support for her along with her church. Pt desired prayer for healing which was provided. CH is available for follow up as needed.    12/19/16 1300  Clinical Encounter Type  Visited With Patient;Patient and family together  Visit Type Initial;Spiritual support  Consult/Referral To Chaplain  Spiritual Encounters  Spiritual Needs Prayer

## 2016-12-19 NOTE — ED Notes (Signed)
Patient transported to CT at this time. 

## 2016-12-19 NOTE — Care Management (Signed)
Admitted unde observation status with the diagnosis of TIA. Lives alone, but daughter Mardene Celeste stays with her most of the time. Marianna Fuss daughter/contact person [phone]). Last seen Dr. Ginette Pitman about a month ago. [facility] for services 07/2015. No other Home Health agencies. No skilled facility. No Home oxygen. Does have Life Alert in the home. Prescriptions are filled at [facility]. Takes care of all basic and instrumental activities of daily living herself. Slid to the floor about a month ago, no injuries. Good appetite. Daughter will transport. Worked as a Optometrist in the past. Shelbie Ammons RN MSN [facility] [phone]

## 2016-12-19 NOTE — Discharge Instructions (Addendum)
Heart healthy diet  Activity as tolerated  You will need follow up with a cardiologist at [facility] clinic  Carotid ultrasound results are pending.

## 2016-12-19 NOTE — ED Triage Notes (Signed)
Pt states she was here on yesterday for TIA. She is triaged in wheelchair with slurred speech.

## 2016-12-19 NOTE — Care Management Obs Status (Signed)
[place] NOTIFICATION   Patient Details  Name: AASIYA CREASEY MRN: [id number] Date of Birth: 07/03/1930   Medicare Observation Status Notification Given:  Yes    Shelbie Ammons, RN 12/19/2016, 8:35 AM

## 2016-12-19 NOTE — ED Notes (Signed)
Gave patient a warm blanket.

## 2016-12-19 NOTE — ED Provider Notes (Signed)
[facility] Provider Note   CSN: [id number] Arrival date & time: 12/19/16  2148     History   Chief Complaint Chief Complaint  Patient presents with  . Aphasia    HPI Patricia Dudley is a 83 y.o. female hx of afib, DM, HTN, HL here with slurred speech. Patient was admitted to the hospital yesterday and MRI brain showed 3 subcentimeter infarctions in the left hemisphere. She was started on full dose ASA. Patient went home this afternoon and was on her couch this evening. Around 8:45 pm, her family tried to talk to her and she wasn't responding and had trouble speaking. When she did speak, she seemed to have slurred speech. Per the daughter, she was complaining of bilateral arms and leg numbness. She was brought back to the ED and symptoms have now resolved.   The history is provided by the patient.    Past Medical History:  Diagnosis Date  . A-fib ([place])   . Diabetes mellitus without complication ([place])   . Hyperlipemia   . Hypertension   . Thyroid disease     Patient Active Problem List   Diagnosis Date Noted  . GI bleed 08/07/2016  . 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 Renal insufficiency 07/26/2015  . TIA (transient ischemic attack) 07/26/2015  . Vertigo 07/26/2015    Past Surgical History:  Procedure Laterality Date  . ESOPHAGOGASTRODUODENOSCOPY N/A 08/08/2016   Procedure: ESOPHAGOGASTRODUODENOSCOPY (EGD);  Surgeon: [name], MD;  Location: [facility] ENDOSCOPY;  Service: Endoscopy;  Laterality: N/A;  . EYE SURGERY     left    OB History    No data available       Home Medications    Prior to Admission medications   Medication Sig Start Date End Date Taking? Authorizing Provider  aspirin EC 325 MG tablet Take 1 tablet (325 mg total) by mouth daily. 12/19/16   Srikar Sudini, MD  atorvastatin (LIPITOR) 10 MG tablet Take 1 tablet (10 mg total) by mouth daily. 12/19/16   Srikar Sudini, MD  docusate sodium (COLACE) 100 MG capsule  Take 100 mg by mouth daily as needed for mild constipation.    Historical Provider, MD  glipiZIDE (GLUCOTROL XL) 10 MG 24 hr tablet Take 10 mg by mouth daily.    Historical Provider, MD  hydrOXYzine (ATARAX/VISTARIL) 25 MG tablet Take 25 mg by mouth every evening.     Historical Provider, MD  iron polysaccharides (NIFEREX) 150 MG capsule Take 150 mg by mouth daily.    Historical Provider, MD  latanoprost (XALATAN) 0.005 % ophthalmic solution Place 1 drop into both eyes at bedtime.    Historical Provider, MD  levothyroxine (SYNTHROID, LEVOTHROID) 112 MCG tablet Take 112 mcg by mouth daily.    Historical Provider, MD  lisinopril (PRINIVIL,ZESTRIL) 40 MG tablet Take 1 tablet (40 mg total) by mouth daily. 07/27/15   [name], MD  pantoprazole (PROTONIX) 40 MG tablet Take 1 tablet (40 mg total) by mouth 2 (two) times daily. Patient not taking: Reported on 12/18/2016 08/09/16   Max Sane, MD  sucralfate (CARAFATE) 1 g tablet Take 1 g by mouth 4 (four) times daily.    Historical Provider, MD  vitamin B-12 (CYANOCOBALAMIN) 1000 MCG tablet Take 1,000 mcg by mouth daily.    Historical Provider, MD    Family History Family History  Problem Relation Age of Onset  . Diabetes Mother   . CVA Mother   . Hypertension Mother   . Diabetes Sister   .  Diabetes Brother     Social History Social History  Substance Use Topics  . Smoking status: Never Smoker  . Smokeless tobacco: Never Used  . Alcohol use No     Allergies   Iodine   Review of Systems Review of Systems  Neurological: Positive for speech difficulty.  All other systems reviewed and are negative.    Physical Exam Updated Vital Signs BP (!) 166/103 (BP Location: Right Arm)   Pulse 89   Temp 98.4 F (36.9 C)   Resp 18   Ht 5\' 6"  (1.676 m)   Wt 139 lb (63 kg)   SpO2 99%   BMI 22.44 kg/m   Physical Exam  Constitutional: She is oriented to person, place, and time.  Chronically ill   HENT:  Head: Normocephalic.    Mouth/Throat: Oropharynx is clear and moist.  Eyes: EOM are normal. Pupils are equal, round, and reactive to light.  Neck: Normal range of motion. Neck supple.  Cardiovascular: Normal rate, regular rhythm and normal heart sounds.   Pulmonary/Chest: Effort normal and breath sounds normal. No respiratory distress. She has no wheezes.  Abdominal: Soft. Bowel sounds are normal. She exhibits no distension. There is no tenderness. There is no guarding.  Musculoskeletal: Normal range of motion.  Neurological: She is alert and oriented to person, place, and time. No cranial nerve deficit. Coordination normal.  No obvious slurred speech. CN 2-12 intact. Nl strength throughout. No facial droop   Skin: Skin is warm.  Psychiatric: She has a normal mood and affect.  Nursing note and vitals reviewed.    ED Treatments / Results  Labs (all labs ordered are listed, but only abnormal results are displayed) Labs Reviewed  CBC WITH DIFFERENTIAL/PLATELET - Abnormal; Notable for the following:       Result Value   MCV 76.0 (*)    MCH 25.4 (*)    RDW 18.4 (*)    All other components within normal limits  COMPREHENSIVE METABOLIC PANEL  TROPONIN I    EKG  EKG Interpretation None      ED ECG REPORT I, Wandra Arthurs, the attending physician, personally viewed and interpreted this ECG.   Date: 12/19/2016  EKG Time: 22:06 pm  Rate: 89  Rhythm: atrial fibrillation, rate 89  Axis: normal  Intervals:none  ST&T Change: nonspecific    Radiology Ct Head Wo Contrast  Result Date: 12/19/2016 CLINICAL DATA:  Slurred speech EXAM: CT HEAD WITHOUT CONTRAST TECHNIQUE: Contiguous axial images were obtained from the base of the skull through the vertex without intravenous contrast. COMPARISON:  MRI 12/19/2016, CT brain 12/18/2016 FINDINGS: Brain: No mass or hemorrhage is visualized. No territorial infarct. Extensive periventricular and subcortical white matter small vessel ischemic changes as before. Stable  ventricle size. Vascular: No hyperdense vessels.  Carotid artery calcifications. Skull: No fracture.  No suspicious bone lesion Sinuses/Orbits: Mucosal thickening in the sphenoid and ethmoid sinuses. Other: None IMPRESSION: 1. No definite CT evidence for acute intracranial abnormality. MRI demonstrated infarcts in the left hemisphere are not well appreciated by CT. MRI follow-up may be performed as clinically indicated. 2. Extensive bilateral white matter small vessel ischemic changes. Electronically Signed   By: Donavan Foil M.D.   On: 12/19/2016 23:02   Mr Brain Wo Contrast  Result Date: 12/19/2016 CLINICAL DATA:  Right arm and hand numbness and slurred speech beginning yesterday. EXAM: MRI HEAD WITHOUT CONTRAST TECHNIQUE: Multiplanar, multiecho pulse sequences of the brain and surrounding structures were obtained without intravenous contrast. COMPARISON:  Head CT 12/18/2016.  MRI 07/10/2009 FINDINGS: Brain: Diffusion imaging shows 2 or 3 small foci of acute/ subacute infarction in the left hemisphere. There is a small focus within the left side of the corpus callosum. Two small foci are present at the left posterior frontal cortical and subcortical brain. No large or confluent infarction. Extensive chronic small-vessel ischemic changes affect the deep and subcortical white matter. Old lacunar infarction within the thalami. Chronic small-vessel ischemic changes within the pons. Old small vessel infarctions in the cerebellum. Old hemorrhagic infarction in the right cerebellum. Few other scattered foci of hemosiderin deposition in the cerebral hemispheres likely represent residual hemosiderin from old micro hemorrhagic insults. No hydrocephalus. No extra-axial collection. Vascular: Major vessels at the base of the brain show flow. Skull and upper cervical spine: Negative Sinuses/Orbits: Clear/normal Other: None significant IMPRESSION: Approximately 3 subcentimeter acute infarctions in the left hemisphere. There  is involvement of the left side of the corpus callosum in the left posterior frontal cortical and subcortical brain. No large confluent infarction. Chronic small-vessel ischemic changes elsewhere throughout the brain as outlined above. Some of these are associated with hemosiderin deposition. Findings are progressive since 2010. Electronically Signed   By: Nelson Chimes M.D.   On: 12/19/2016 12:45   Ct Head Code Stroke Wo Contrast`  Result Date: 12/18/2016 CLINICAL DATA:  Code stroke. Right-sided numbness which began 4 hours ago. EXAM: CT HEAD WITHOUT CONTRAST TECHNIQUE: Contiguous axial images were obtained from the base of the skull through the vertex without intravenous contrast. COMPARISON:  07/26/2015 FINDINGS: Brain: Age related atrophy. Extensive chronic small vessel ischemic changes throughout the cerebral hemispheric deep and subcortical white matter. No sign of acute infarction, mass lesion, hemorrhage, hydrocephalus or extra-axial collection. Vascular: There is atherosclerotic calcification of the major vessels at the base of the brain. Skull: Negative Sinuses/Orbits: Some inflammatory changes of the left sphenoid sinus. Opacified left posterior ethmoid air cell. Other sinuses clear. Orbits negative. Other: None ASPECTS ([place] Stroke Program Early CT Score) - Ganglionic level infarction (caudate, lentiform nuclei, internal capsule, insula, M1-M3 cortex): 7 - Supraganglionic infarction (M4-M6 cortex): 3 Total score (0-10 with 10 being normal): 10 IMPRESSION: 1. No acute infarction. Atrophy an extensive chronic small vessel ischemic changes throughout the brain. 2. ASPECTS is 10. These results were called by telephone at the time of interpretation on 12/18/2016 at 1:54 pm to Dr. Lenise Arena , who verbally acknowledged these results. Electronically Signed   By: Nelson Chimes M.D.   On: 12/18/2016 13:56    Procedures Procedures (including critical care time)  Medications Ordered in  ED Medications  hydrALAZINE (APRESOLINE) injection 10 mg (10 mg Intravenous Given 12/19/16 2247)     Initial Impression / Assessment and Plan / ED Course  I have reviewed the triage vital signs and the nursing notes.  Pertinent labs & imaging results that were available during my care of the patient were reviewed by me and considered in my medical decision making (see chart for details).     Patricia Dudley is a 83 y.o. female here with recurrent slurred speech, numbness. NIH is 0 on arrival. I called Dr. Irish Elders, neurologist, who saw the patient this morning. He states that patient had extensive workup yesterday and today. Patient is only on ASA 325 mg for afib since she has an ulcer and had previous GI bleed. BP 214/77 on arrival. He recommend treating for hypertensive urgency with goal BP 160s. I will repeat labs, CT head.   11:13 PM  BP still 170 after hydralazine. Will admit for hypertensive urgency.    Final Clinical Impressions(s) / ED Diagnoses   Final diagnoses:  None    New Prescriptions New Prescriptions   No medications on file     Drenda Freeze, MD 12/19/16 2313

## 2016-12-19 NOTE — Consult Note (Signed)
Reason for Consult:R hand numbness  Referring Physician: Dr. Darvin Neighbours  CC: R hand numbness   HPI: Patricia Dudley is an 83 y.o. female female with a known history of Atrial fibrillation, diabetes, hypertension, hyperlipidemia who presents to the hospital due to right arm and hand numbness along with some slurred speech. Currently pt is back to baseline.  She was off ASA for prolonged period of time due to ulcer but it was recently restarted at ASA 81mg .    Past Medical History:  Diagnosis Date  . A-fib ([place])   . Diabetes mellitus without complication ([place])   . Hyperlipemia   . Hypertension   . Thyroid disease     Past Surgical History:  Procedure Laterality Date  . ESOPHAGOGASTRODUODENOSCOPY N/A 08/08/2016   Procedure: ESOPHAGOGASTRODUODENOSCOPY (EGD);  Surgeon: [name], MD;  Location: [facility] ENDOSCOPY;  Service: Endoscopy;  Laterality: N/A;  . EYE SURGERY     left    Family History  Problem Relation Age of Onset  . Diabetes Mother   . CVA Mother   . Hypertension Mother   . Diabetes Sister   . Diabetes Brother     Social History:  reports that she has never smoked. She has never used smokeless tobacco. She reports that she does not drink alcohol or use drugs.  Allergies  Allergen Reactions  . Iodine Rash    Medications: I have reviewed the patient's current medications.  ROS: History obtained from the patient  General ROS: negative for - chills, fatigue, fever, night sweats, weight gain or weight loss Psychological ROS: negative for - behavioral disorder, hallucinations, memory difficulties, mood swings or suicidal ideation Ophthalmic ROS: negative for - blurry vision, double vision, eye pain or loss of vision ENT ROS: negative for - epistaxis, nasal discharge, oral lesions, sore throat, tinnitus or vertigo Allergy and Immunology ROS: negative for - hives or itchy/watery eyes Hematological and Lymphatic ROS: negative for - bleeding problems, bruising or  swollen lymph nodes Endocrine ROS: negative for - galactorrhea, hair pattern changes, polydipsia/polyuria or temperature intolerance Respiratory ROS: negative for - cough, hemoptysis, shortness of breath or wheezing Cardiovascular ROS: negative for - chest pain, dyspnea on exertion, edema or irregular heartbeat Gastrointestinal ROS: negative for - abdominal pain, diarrhea, hematemesis, nausea/vomiting or stool incontinence Genito-Urinary ROS: negative for - dysuria, hematuria, incontinence or urinary frequency/urgency Musculoskeletal ROS: negative for - joint swelling or muscular weakness Neurological ROS: as noted in HPI Dermatological ROS: negative for rash and skin lesion changes  Physical Examination: Blood pressure (!) 173/94, pulse 70, temperature 98.6 F (37 C), temperature source Oral, resp. rate 18, height 5\' 6"  (1.676 m), weight 62.6 kg (138 lb), SpO2 99 %.    Neurological Examination   Mental Status: Alert, oriented, thought content appropriate.  Speech fluent without evidence of aphasia.  Able to follow 3 step commands without difficulty. Cranial Nerves: II: Discs flat bilaterally; Visual fields grossly normal, pupils equal, round, reactive to light and accommodation III,IV, VI: ptosis not present, extra-ocular motions intact bilaterally V,VII: smile symmetric, facial light touch sensation normal bilaterally VIII: hearing normal bilaterally IX,X: gag reflex present XI: bilateral shoulder shrug XII: midline tongue extension Motor: Right : Upper extremity   5/5    Left:     Upper extremity   5/5  Lower extremity   5/5     Lower extremity   5/5 Tone and bulk:normal tone throughout; no atrophy noted Sensory: Pinprick and light touch intact throughout, bilaterally Deep Tendon Reflexes: 2+ and  symmetric throughout Plantars: Right: downgoing   Left: downgoing Cerebellar: normal finger-to-nose, normal rapid alternating movements and normal heel-to-shin test Gait: not tested       Laboratory Studies:   Basic Metabolic Panel:  Recent Labs Lab 12/18/16 1347 12/19/16 0409  NA 140 142  K 3.4* 3.6  CL 104 109  CO2 27 27  GLUCOSE 231* 109*  BUN 20 20  CREATININE 1.74* 1.48*  CALCIUM 8.4* 8.1*    Liver Function Tests:  Recent Labs Lab 12/18/16 1347  AST 21  ALT 13*  ALKPHOS 74  BILITOT 0.9  PROT 7.4  ALBUMIN 3.5   No results for input(s): LIPASE, AMYLASE in the last 168 hours. No results for input(s): AMMONIA in the last 168 hours.  CBC:  Recent Labs Lab 12/18/16 1347 12/19/16 0409  WBC 7.2 5.3  NEUTROABS 4.6  --   HGB 11.6* 10.5*  HCT 34.9* 31.2*  MCV 77.0* 76.5*  PLT 332 282    Cardiac Enzymes:  Recent Labs Lab 12/18/16 1347  TROPONINI <0.03    BNP: Invalid input(s): POCBNP  CBG:  Recent Labs Lab 12/18/16 1338 12/19/16 0738  GLUCAP 213* 94    Microbiology: No results found for this or any previous visit.  Coagulation Studies:  Recent Labs  12/18/16 1347  LABPROT 13.8  INR 1.06    Urinalysis: No results for input(s): COLORURINE, LABSPEC, PHURINE, GLUCOSEU, HGBUR, BILIRUBINUR, KETONESUR, PROTEINUR, UROBILINOGEN, NITRITE, LEUKOCYTESUR in the last 168 hours.  Invalid input(s): APPERANCEUR  Lipid Panel:  No results found for: CHOL, TRIG, HDL, CHOLHDL, VLDL, LDLCALC  HgbA1C:  Lab Results  Component Value Date   HGBA1C 6.5 (H) 07/26/2015    Urine Drug Screen:  No results found for: LABOPIA, COCAINSCRNUR, LABBENZ, AMPHETMU, THCU, LABBARB  Alcohol Level: No results for input(s): ETH in the last 168 hours.  Other results: EKG: paroxysmal A-fib  Imaging: Ct Head Code Stroke Wo Contrast`  Result Date: 12/18/2016 CLINICAL DATA:  Code stroke. Right-sided numbness which began 4 hours ago. EXAM: CT HEAD WITHOUT CONTRAST TECHNIQUE: Contiguous axial images were obtained from the base of the skull through the vertex without intravenous contrast. COMPARISON:  07/26/2015 FINDINGS: Brain: Age related atrophy.  Extensive chronic small vessel ischemic changes throughout the cerebral hemispheric deep and subcortical white matter. No sign of acute infarction, mass lesion, hemorrhage, hydrocephalus or extra-axial collection. Vascular: There is atherosclerotic calcification of the major vessels at the base of the brain. Skull: Negative Sinuses/Orbits: Some inflammatory changes of the left sphenoid sinus. Opacified left posterior ethmoid air cell. Other sinuses clear. Orbits negative. Other: None ASPECTS ([place] Stroke Program Early CT Score) - Ganglionic level infarction (caudate, lentiform nuclei, internal capsule, insula, M1-M3 cortex): 7 - Supraganglionic infarction (M4-M6 cortex): 3 Total score (0-10 with 10 being normal): 10 IMPRESSION: 1. No acute infarction. Atrophy an extensive chronic small vessel ischemic changes throughout the brain. 2. ASPECTS is 10. These results were called by telephone at the time of interpretation on 12/18/2016 at 1:54 pm to Dr. Lenise Arena , who verbally acknowledged these results. Electronically Signed   By: Nelson Chimes M.D.   On: 12/18/2016 13:56     Assessment/Plan:    83 y.o. female female with a known history of Atrial fibrillation, diabetes, hypertension, hyperlipidemia who presents to the hospital due to right arm and hand numbness along with some slurred speech. Currently pt is back to baseline.  She was off ASA for prolonged period of time due to ulcer but it was recently restarted  at ASA 81mg .    - Possibly subcortical ischemia - significant risk factors including A-fib, DM, HNt, HLD.   - MRI pending - Would strongly consider anticoagulation as out pt in bout 7 days if there is ischemia on MRI otherwise can start today. This also has to be discussed with GI as pt had ulcers and she was off ASA.   - pt/ot and d/c planning   12/19/2016, 10:40 AM

## 2016-12-20 ENCOUNTER — Inpatient Hospital Stay: Payer: Medicare Other

## 2016-12-20 ENCOUNTER — Encounter: Payer: Self-pay | Admitting: Internal Medicine

## 2016-12-20 DIAGNOSIS — I160 Hypertensive urgency: Secondary | ICD-10-CM | POA: Diagnosis present

## 2016-12-20 DIAGNOSIS — Z7982 Long term (current) use of aspirin: Secondary | ICD-10-CM | POA: Diagnosis not present

## 2016-12-20 DIAGNOSIS — Z79899 Other long term (current) drug therapy: Secondary | ICD-10-CM | POA: Diagnosis not present

## 2016-12-20 DIAGNOSIS — I480 Paroxysmal atrial fibrillation: Secondary | ICD-10-CM | POA: Diagnosis present

## 2016-12-20 DIAGNOSIS — Z91041 Radiographic dye allergy status: Secondary | ICD-10-CM | POA: Diagnosis not present

## 2016-12-20 DIAGNOSIS — Z66 Do not resuscitate: Secondary | ICD-10-CM | POA: Diagnosis present

## 2016-12-20 DIAGNOSIS — Z7984 Long term (current) use of oral hypoglycemic drugs: Secondary | ICD-10-CM | POA: Diagnosis not present

## 2016-12-20 DIAGNOSIS — I10 Essential (primary) hypertension: Secondary | ICD-10-CM | POA: Diagnosis present

## 2016-12-20 DIAGNOSIS — I639 Cerebral infarction, unspecified: Secondary | ICD-10-CM | POA: Diagnosis present

## 2016-12-20 DIAGNOSIS — E039 Hypothyroidism, unspecified: Secondary | ICD-10-CM | POA: Diagnosis present

## 2016-12-20 DIAGNOSIS — E785 Hyperlipidemia, unspecified: Secondary | ICD-10-CM | POA: Diagnosis present

## 2016-12-20 DIAGNOSIS — E1136 Type 2 diabetes mellitus with diabetic cataract: Secondary | ICD-10-CM | POA: Diagnosis present

## 2016-12-20 LAB — LIPID PANEL
CHOLESTEROL: 263 mg/dL — AB (ref 0–200)
HDL: 57 mg/dL (ref >40)
LDL Cholesterol: 186 mg/dL — ABNORMAL HIGH (ref 0–99)
TRIGLYCERIDES: 101 mg/dL (ref <150)
Total CHOL/HDL Ratio: 4.6 RATIO
VLDL: 20 mg/dL (ref 0–40)

## 2016-12-20 LAB — TROPONIN I

## 2016-12-20 MED ORDER — SODIUM CHLORIDE 0.9 % IV SOLN
INTRAVENOUS | Status: DC
  Administered 2016-12-20: 02:00:00 via INTRAVENOUS

## 2016-12-20 MED ORDER — ACETAMINOPHEN 325 MG PO TABS: 650.0000 mg | ORAL_TABLET | ORAL | Status: DC | PRN

## 2016-12-20 MED ORDER — NITROGLYCERIN 2 % TD OINT
0.5000 [in_us] | TOPICAL_OINTMENT | Freq: Once | TRANSDERMAL | Status: AC
  Administered 2016-12-20: 05:00:00 0.5 [in_us] via TOPICAL
  Filled 2016-12-20: qty 0.5

## 2016-12-20 MED ORDER — AMLODIPINE BESYLATE 5 MG PO TABS: 5.0000 mg | ORAL_TABLET | Freq: Every day | ORAL | 0 refills | Status: DC

## 2016-12-20 MED ORDER — AMLODIPINE BESYLATE 5 MG PO TABS
5.0000 mg | ORAL_TABLET | Freq: Every day | ORAL | Status: DC
  Administered 2016-12-20: 5 mg via ORAL
  Filled 2016-12-20: qty 1

## 2016-12-20 MED ORDER — LEVOTHYROXINE SODIUM 112 MCG PO TABS
112.0000 ug | ORAL_TABLET | Freq: Every day | ORAL | Status: DC
  Administered 2016-12-20: 112 ug via ORAL
  Filled 2016-12-20: qty 1

## 2016-12-20 MED ORDER — HYDRALAZINE HCL 20 MG/ML IJ SOLN
10.0000 mg | INTRAMUSCULAR | Status: DC | PRN
  Administered 2016-12-20: 04:00:00 10 mg via INTRAVENOUS
  Filled 2016-12-20: qty 1

## 2016-12-20 MED ORDER — STROKE: EARLY STAGES OF RECOVERY BOOK
Freq: Once | Status: AC
  Administered 2016-12-20: 03:00:00

## 2016-12-20 MED ORDER — ACETAMINOPHEN 650 MG RE SUPP: 650.0000 mg | RECTAL | Status: DC | PRN

## 2016-12-20 MED ORDER — GLIPIZIDE ER 5 MG PO TB24
10.0000 mg | ORAL_TABLET | Freq: Every day | ORAL | Status: DC
  Administered 2016-12-20: 10:00:00 10 mg via ORAL
  Filled 2016-12-20: qty 2

## 2016-12-20 MED ORDER — ATORVASTATIN CALCIUM 20 MG PO TABS: 10.0000 mg | ORAL_TABLET | Freq: Every day | ORAL | Status: DC

## 2016-12-20 MED ORDER — LABETALOL HCL 5 MG/ML IV SOLN
10.0000 mg | Freq: Once | INTRAVENOUS | Status: DC
  Filled 2016-12-20: qty 4

## 2016-12-20 MED ORDER — NITROGLYCERIN 2 % TD OINT
0.5000 [in_us] | TOPICAL_OINTMENT | Freq: Four times a day (QID) | TRANSDERMAL | Status: DC
  Filled 2016-12-20: qty 30

## 2016-12-20 MED ORDER — LATANOPROST 0.005 % OP SOLN
1.0000 [drp] | Freq: Every day | OPHTHALMIC | Status: DC
  Filled 2016-12-20: qty 2.5

## 2016-12-20 MED ORDER — ACETAMINOPHEN 160 MG/5ML PO SOLN: 650.0000 mg | ORAL | Status: DC | PRN

## 2016-12-20 MED ORDER — POLYSACCHARIDE IRON COMPLEX 150 MG PO CAPS
150.0000 mg | ORAL_CAPSULE | Freq: Every day | ORAL | Status: DC
  Administered 2016-12-20: 10:00:00 150 mg via ORAL
  Filled 2016-12-20: qty 1

## 2016-12-20 MED ORDER — ALUM & MAG HYDROXIDE-SIMETH 200-200-20 MG/5ML PO SUSP: 15.0000 mL | Freq: Four times a day (QID) | ORAL | Status: DC | PRN

## 2016-12-20 MED ORDER — DOCUSATE SODIUM 100 MG PO CAPS: 100.0000 mg | ORAL_CAPSULE | Freq: Every day | ORAL | Status: DC | PRN

## 2016-12-20 MED ORDER — VITAMIN B-12 1000 MCG PO TABS
1000.0000 ug | ORAL_TABLET | Freq: Every day | ORAL | Status: DC
  Administered 2016-12-20: 10:00:00 1000 ug via ORAL
  Filled 2016-12-20: qty 1

## 2016-12-20 MED ORDER — LISINOPRIL 20 MG PO TABS
40.0000 mg | ORAL_TABLET | Freq: Every day | ORAL | Status: DC
  Administered 2016-12-20: 40 mg via ORAL
  Filled 2016-12-20: qty 2

## 2016-12-20 MED ORDER — ASPIRIN EC 325 MG PO TBEC
325.0000 mg | DELAYED_RELEASE_TABLET | Freq: Every day | ORAL | Status: DC
  Administered 2016-12-20: 325 mg via ORAL
  Filled 2016-12-20: qty 1

## 2016-12-20 MED ORDER — SUCRALFATE 1 G PO TABS
1.0000 g | ORAL_TABLET | Freq: Four times a day (QID) | ORAL | Status: DC
  Administered 2016-12-20: 10:00:00 1 g via ORAL
  Filled 2016-12-20 (×2): qty 1

## 2016-12-20 MED ORDER — HYDROXYZINE HCL 25 MG PO TABS: 25.0000 mg | ORAL_TABLET | Freq: Every evening | ORAL | Status: DC

## 2016-12-20 MED ORDER — PANTOPRAZOLE SODIUM 40 MG PO TBEC: 40.0000 mg | DELAYED_RELEASE_TABLET | Freq: Two times a day (BID) | ORAL | 0 refills | Status: DC

## 2016-12-20 MED ORDER — PANTOPRAZOLE SODIUM 40 MG PO TBEC
40.0000 mg | DELAYED_RELEASE_TABLET | Freq: Two times a day (BID) | ORAL | Status: DC
  Administered 2016-12-20: 10:00:00 40 mg via ORAL
  Filled 2016-12-20: qty 1

## 2016-12-20 NOTE — Discharge Summary (Signed)
[facility] [place] NAME: Patricia Dudley    MR#:  [id number]  DATE OF BIRTH:  1929/09/22  DATE OF ADMISSION:  12/19/2016 ADMITTING PHYSICIAN: Saundra Shelling, MD  DATE OF DISCHARGE: 12/20/2016  3:43 PM  PRIMARY CARE PHYSICIAN: Tracie Harrier, MD   ADMISSION DIAGNOSIS:  Hypertensive urgency [I16.0]  DISCHARGE DIAGNOSIS:  Active Problems:   Hypertensive urgency   CVA (cerebral vascular accident) ([place])   SECONDARY DIAGNOSIS:   Past Medical History:  Diagnosis Date  . A-fib ([place])   . CVA (cerebral vascular accident) ([place])   . Diabetes mellitus without complication ([place])   . Hyperlipemia   . Hypertension   . Thyroid disease      ADMITTING HISTORY  HISTORY OF PRESENT ILLNESS: Patricia Dudley  is a 83 y.o. female with a known history of Atrial fibrillation, diabetes mellitus type 2, hyperlipidemia, hypertension, hypothyroidism was recently discharged yesterday to home after being diagnosed with ischemic CVA on aspirin. Patient seen and evaluated by me on 12/19/2016 in the emergency room. She went home and was found by family to have increased slurred speech. Patient also complains of some numbness in the fingers of right hand and hence patient was brought back to the emergency room again with the family. She was found to have elevated blood pressure systolic blood pressure around 30 mmHg. Case was discussed by ER physician with neurology attending who saw the patient in the last admission who advised the blood pressure to be kept around 1 60 mmHg systolic. Patient was worked up with MRI brain and carotid ultrasound prior to discharge on Thursday. Patient is awake and her speech appears to be more better in the emergency room. She responds to all verbal commands. She is awake and alert and oriented and moves all extremities. No complaints of any headache, dizziness and blurry vision. Blood pressure was better controlled in the emergency room.  Hospitalist service was consulted for further care of the patient.  HOSPITAL COURSE:   * Three small acute infarcts in the left frontal white matter and corpus callosum, stable from yesterday  Patient after being discharged from the hospital was normal for a few hours but later noticed that she had some numbness in her fingers and slurred speech and was returned to the emergency room. Patient's symptoms resolved in the ER but due to symptoms occurring again she was admitted under observation. Patient had MRI of the brain which showed no change in her stroke. Her symptoms were likely due to uncontrolled high blood pressure. Her lisinopril is being continued. Norvasc added. Blood pressure improved. She will follow-up with her primary care physician in one week and cardiology in 1 week.  At this time she is on aspirin 325 mg daily and statin. A stronger anticoagulation can be considered by her primary care physician and cardiologist if her hemoglobin stays stable and no further bleeding. She does have paroxysmal atrial fibrillation. Had recent GI bleed 4 months back but this seems to have healed well and she is on PPI. CONSULTS OBTAINED:  Treatment Team:  Alexis Goodell, MD Catarina Hartshorn, MD  DRUG ALLERGIES:   Allergies  Allergen Reactions  . Iodine Rash    DISCHARGE MEDICATIONS:   Discharge Medication List as of 12/20/2016  3:12 PM    START taking these medications   Details  amLODipine (NORVASC) 5 MG tablet Take 1 tablet (5 mg total) by mouth daily., Starting Sat 12/21/2016, Normal      CONTINUE these  medications which have CHANGED   Details  pantoprazole (PROTONIX) 40 MG tablet Take 1 tablet (40 mg total) by mouth 2 (two) times daily before a meal., Starting Fri 12/20/2016, Normal      CONTINUE these medications which have NOT CHANGED   Details  aspirin EC 325 MG tablet Take 1 tablet (325 mg total) by mouth daily., Starting Thu 12/19/2016, Normal    atorvastatin (LIPITOR) 10  MG tablet Take 1 tablet (10 mg total) by mouth daily., Starting Thu 12/19/2016, Normal    docusate sodium (COLACE) 100 MG capsule Take 100 mg by mouth daily as needed for mild constipation., Historical Med    glipiZIDE (GLUCOTROL XL) 10 MG 24 hr tablet Take 10 mg by mouth daily., Historical Med    hydrOXYzine (ATARAX/VISTARIL) 25 MG tablet Take 25 mg by mouth every evening. , Historical Med    iron polysaccharides (NIFEREX) 150 MG capsule Take 150 mg by mouth daily., Historical Med    latanoprost (XALATAN) 0.005 % ophthalmic solution Place 1 drop into both eyes at bedtime., Historical Med    levothyroxine (SYNTHROID, LEVOTHROID) 112 MCG tablet Take 112 mcg by mouth daily., Historical Med    lisinopril (PRINIVIL,ZESTRIL) 40 MG tablet Take 1 tablet (40 mg total) by mouth daily., Starting Thu 07/27/2015, Normal    vitamin B-12 (CYANOCOBALAMIN) 1000 MCG tablet Take 1,000 mcg by mouth daily., Historical Med    sucralfate (CARAFATE) 1 g tablet Take 1 g by mouth 4 (four) times daily., Historical Med        Today   VITAL SIGNS:  Blood pressure (!) 157/61, pulse 74, temperature 97.8 F (36.6 C), temperature source Oral, resp. rate (!) 100, height 5\' 6"  (1.676 m), weight 62.8 kg (138 lb 6.4 oz), SpO2 100 %.  I/O:   Intake/Output Summary (Last 24 hours) at 12/20/16 1716 Last data filed at 12/20/16 1300  Gross per 24 hour  Intake           513.33 ml  Output                0 ml  Net           513.33 ml    PHYSICAL EXAMINATION:  Physical Exam  GENERAL:  83 y.o.-year-old patient lying in the bed with no acute distress.  LUNGS: Normal breath sounds bilaterally, no wheezing, rales,rhonchi or crepitation. No use of accessory muscles of respiration.  CARDIOVASCULAR: S1, S2 normal. No murmurs, rubs, or gallops.  ABDOMEN: Soft, non-tender, non-distended. Bowel sounds present. No organomegaly or mass.  NEUROLOGIC: Moves all 4 extremities. PSYCHIATRIC: The patient is alert and oriented x 3.   SKIN: No obvious rash, lesion, or ulcer.   DATA REVIEW:   CBC  Recent Labs Lab 12/19/16 2240  WBC 6.7  HGB 12.0  HCT 35.8  PLT 297    Chemistries   Recent Labs Lab 12/19/16 2240  NA 139  K 3.7  CL 104  CO2 27  GLUCOSE 165*  BUN 27*  CREATININE 1.78*  CALCIUM 8.6*  AST 19  ALT 12*  ALKPHOS 73  BILITOT 0.8    Cardiac Enzymes  Recent Labs Lab 12/20/16 0512  TROPONINI <0.03    Microbiology Results  No results found for this or any previous visit.  RADIOLOGY:  Ct Head Wo Contrast  Result Date: 12/19/2016 CLINICAL DATA:  Slurred speech EXAM: CT HEAD WITHOUT CONTRAST TECHNIQUE: Contiguous axial images were obtained from the base of the skull through the vertex without intravenous contrast. COMPARISON:  MRI 12/19/2016, CT brain 12/18/2016 FINDINGS: Brain: No mass or hemorrhage is visualized. No territorial infarct. Extensive periventricular and subcortical white matter small vessel ischemic changes as before. Stable ventricle size. Vascular: No hyperdense vessels.  Carotid artery calcifications. Skull: No fracture.  No suspicious bone lesion Sinuses/Orbits: Mucosal thickening in the sphenoid and ethmoid sinuses. Other: None IMPRESSION: 1. No definite CT evidence for acute intracranial abnormality. MRI demonstrated infarcts in the left hemisphere are not well appreciated by CT. MRI follow-up may be performed as clinically indicated. 2. Extensive bilateral white matter small vessel ischemic changes. Electronically Signed   By: Donavan Foil M.D.   On: 12/19/2016 23:02   Mr Brain Wo Contrast  Result Date: 12/20/2016 CLINICAL DATA:  Recent infarct in the left cerebrum. Worsening status at home, particularly slurred speech, but now back of prior baseline. EXAM: MRI HEAD WITHOUT CONTRAST TECHNIQUE: Multiplanar, multiecho pulse sequences of the brain and surrounding structures were obtained without intravenous contrast. COMPARISON:  Yesterday FINDINGS: Brain: 3 subcentimeter  acute infarcts in the left cerebrum, 2 subcortical in the posterior frontal region and the other along the left corpus callosum. No progression since prior. Negative for hemorrhage. Chronic microvascular ischemia with extensive gliosis in the cerebral white matter. Chronic lacune in the right frontal white matter. Mild patchy microvascular ischemic change in the pons. Few scattered remote hemorrhagic foci, largest in the deep right cerebellum. Normal brain volume for age. No acute hemorrhage, hydrocephalus, or mass. Vascular: Major flow voids are preserved. Skull and upper cervical spine: No marrow lesion. Hyperostosis interna. Sinuses/Orbits: No acute finding.  Bilateral cataract resection. IMPRESSION: 1. Three small acute infarcts in the left frontal white matter and corpus callosum, stable from yesterday. No progressive infarct or acute hemorrhage. 2. Extensive chronic small vessel ischemia. Electronically Signed   By: Monte Fantasia M.D.   On: 12/20/2016 12:11   Mr Brain Wo Contrast  Result Date: 12/19/2016 CLINICAL DATA:  Right arm and hand numbness and slurred speech beginning yesterday. EXAM: MRI HEAD WITHOUT CONTRAST TECHNIQUE: Multiplanar, multiecho pulse sequences of the brain and surrounding structures were obtained without intravenous contrast. COMPARISON:  Head CT 12/18/2016.  MRI 07/10/2009 FINDINGS: Brain: Diffusion imaging shows 2 or 3 small foci of acute/ subacute infarction in the left hemisphere. There is a small focus within the left side of the corpus callosum. Two small foci are present at the left posterior frontal cortical and subcortical brain. No large or confluent infarction. Extensive chronic small-vessel ischemic changes affect the deep and subcortical white matter. Old lacunar infarction within the thalami. Chronic small-vessel ischemic changes within the pons. Old small vessel infarctions in the cerebellum. Old hemorrhagic infarction in the right cerebellum. Few other scattered  foci of hemosiderin deposition in the cerebral hemispheres likely represent residual hemosiderin from old micro hemorrhagic insults. No hydrocephalus. No extra-axial collection. Vascular: Major vessels at the base of the brain show flow. Skull and upper cervical spine: Negative Sinuses/Orbits: Clear/normal Other: None significant IMPRESSION: Approximately 3 subcentimeter acute infarctions in the left hemisphere. There is involvement of the left side of the corpus callosum in the left posterior frontal cortical and subcortical brain. No large confluent infarction. Chronic small-vessel ischemic changes elsewhere throughout the brain as outlined above. Some of these are associated with hemosiderin deposition. Findings are progressive since 2010. Electronically Signed   By: Nelson Chimes M.D.   On: 12/19/2016 12:45   US Carotid Bilateral  Result Date: 12/20/2016 CLINICAL DATA:  Cerebral infarction. EXAM: BILATERAL CAROTID DUPLEX ULTRASOUND TECHNIQUE: Pearline Cables  scale imaging, color Doppler and duplex ultrasound were performed of bilateral carotid and vertebral arteries in the neck. COMPARISON:  MRI 12/19/2016.  Ultrasound 07/26/2015. FINDINGS: Criteria: Quantification of carotid stenosis is based on velocity parameters that correlate the residual internal carotid diameter with NASCET-based stenosis levels, using the diameter of the distal internal carotid lumen as the denominator for stenosis measurement. The following velocity measurements were obtained: RIGHT ICA:  97/18 cm/sec CCA:  05/69 cm/sec SYSTOLIC ICA/CCA RATIO:  1.0 DIASTOLIC ICA/CCA RATIO:  1.5 ECA:  82 cm/sec LEFT ICA:  87/23 cm/sec CCA:  79/48 cm/sec SYSTOLIC ICA/CCA RATIO:  1.0 DIASTOLIC ICA/CCA RATIO:  1.7 ECA:  76 cm/sec RIGHT CAROTID ARTERY: Minimal right carotid atherosclerotic vascular plaque. No flow limiting stenosis. Degree of stenosis less than 50% . RIGHT VERTEBRAL ARTERY:  Patent with antegrade flow. LEFT CAROTID ARTERY: Minimal left carotid  atherosclerotic vascular plaque. No flow limiting stenosis. Degree of stenosis less than 50%. LEFT VERTEBRAL ARTERY:  Patent with antegrade flow. IMPRESSION: 1. Minimal bilateral carotid atherosclerotic vascular plaque. Similar findings on prior exam. No hemodynamically significant stenosis. Degree of stenosis less than 50% bilaterally. 2. Vertebrals are patent with antegrade flow. Electronically Signed   By: Marcello Moores  Register   On: 12/20/2016 06:20    Follow up with PCP in 1 week.  Management plans discussed with the patient, family and they are in agreement.  CODE STATUS:     Code Status Orders        Start     Ordered   12/20/16 0207  Do not attempt resuscitation (DNR)  Continuous    Question Answer Comment  In the event of cardiac or respiratory ARREST Do not call a "code blue"   In the event of cardiac or respiratory ARREST Do not perform Intubation, CPR, defibrillation or ACLS   In the event of cardiac or respiratory ARREST Use medication by any route, position, wound care, and other measures to relive pain and suffering. May use oxygen, suction and manual treatment of airway obstruction as needed for comfort.      12/20/16 0207    Code Status History    Date Active Date Inactive Code Status Order ID Comments User Context   12/18/2016  3:31 PM 12/19/2016  9:43 PM DNR [id number]  Henreitta Leber, MD ED   08/07/2016  5:58 PM 08/09/2016  2:29 PM Full Code [id number]  Vaughan Basta, MD Inpatient   07/26/2015  1:55 PM 07/27/2015  6:32 PM Full Code [id number]  Theodoro Grist, MD Inpatient    Advance Directive Documentation     Most Recent Value  Type of Advance Directive  Living will, Healthcare Power of Attorney  Pre-existing out of facility DNR order (yellow form or pink MOST form)  -  "MOST" Form in Place?  -      TOTAL TIME TAKING CARE OF THIS PATIENT ON DAY OF DISCHARGE: more than 30 minutes.   Hillary Bow R M.D on 12/20/2016 at 5:16 PM  Between 7am to 6pm - Pager -  [phone]  After 6pm go to [email or web address] - password [email or web address] [facility]  Office  [phone]  CC: Primary care physician; Tracie Harrier, MD  Note: This dictation was prepared with Dragon dictation along with smaller phrase technology. Any transcriptional errors that result from this process are unintentional.

## 2016-12-20 NOTE — Evaluation (Signed)
Physical Therapy Evaluation Patient Details Name: Patricia Dudley MRN: [id number] DOB: 1929/10/26 Today's Date: 12/20/2016   History of Present Illness  Pt. is an 83 y.o. female who was admitted to [facility] with Right sided numbness, and slurred speech. Pt. PMHx includes: AFib, DM, Hyperlipidemia, HTN, Hypothroidism, Ischemic CVA, Malignant esstential HTN, GI Bleed, and Vertigo.   Clinical Impression  Pt is a pleasant 83 year old female who was admitted for for suspected CVA.  Pt demonstrates all bed mobility/transfers/ambulation at baseline level. Normal sensation and coordination intact. Pt does not require any further PT needs at this time. Pt will be dc in house and does not require follow up. RN aware. Will dc current orders.      Follow Up Recommendations No PT follow up    Equipment Recommendations  None recommended by PT    Recommendations for Other Services       Precautions / Restrictions Precautions Precautions: Fall Restrictions Weight Bearing Restrictions: No      Mobility  Bed Mobility Overal bed mobility: Independent             General bed mobility comments: safe technique performed with ease of transfer  Transfers Overall transfer level: Independent Equipment used: None Transfers: Sit to/from Stand Sit to Stand: Independent         General transfer comment: safe technique performed without AD. Upright posture noted  Ambulation/Gait Ambulation/Gait assistance: Supervision Ambulation Distance (Feet): 100 Feet Assistive device:  (IV pole) Gait Pattern/deviations: Step-through pattern     General Gait Details: ambulated with good gait speed and reciprocal gait pattern. Pt able to carry conversation during gait assessment and reports she feels at baseline. Further ambulation performed in ther-ex section  Stairs            Wheelchair Mobility    Modified Rankin (Stroke Patients Only)       Balance Overall balance assessment:  Independent                                           Pertinent Vitals/Pain Pain Assessment: No/denies pain Pain Score: 0-No pain    Home Living Family/patient expects to be discharged to:: Private residence Living Arrangements: Alone;Children (Pt reports her daughter is available to stay with her p dc) Available Help at Discharge: Family Type of Home: House Home Access: Level entry     Home Layout: One level Home Equipment: Grab bars - tub/shower;Hand held shower head      Prior Function Level of Independence: Independent      ADL's / Homemaking Assistance Needed: Independent with ADLs, IADLs, and limited driving.         Hand Dominance   Dominant Hand: Right    Extremity/Trunk Assessment   Upper Extremity Assessment Upper Extremity Assessment: Overall WFL for tasks assessed    Lower Extremity Assessment Lower Extremity Assessment: Overall WFL for tasks assessed       Communication   Communication: No difficulties  Cognition Arousal/Alertness: Awake/alert Behavior During Therapy: WFL for tasks assessed/performed Overall Cognitive Status: Within Functional Limits for tasks assessed                                        General Comments      Exercises Other Exercises Other Exercises: Ambulation performed without  IV pole with independence. Supervision given for safety. No LOB or unsteadiness noted. Slight decrease in gait speed without AD.    Assessment/Plan    PT Assessment Patent does not need any further PT services  PT Problem List         PT Treatment Interventions      PT Goals (Current goals can be found in the Care Plan section)  Acute Rehab PT Goals Patient Stated Goal: To return home PT Goal Formulation: With patient Time For Goal Achievement: 12/20/16 Potential to Achieve Goals: Good    Frequency     Barriers to discharge        Co-evaluation               End of Session Equipment  Utilized During Treatment: Gait belt Activity Tolerance: Patient tolerated treatment well Patient left: in bed;with bed alarm set Nurse Communication: Mobility status PT Visit Diagnosis: Muscle weakness (generalized) (M62.81);Unsteadiness on feet (R26.81)    Time: 0375-4360 PT Time Calculation (min) (ACUTE ONLY): 12 min   Charges:   PT Evaluation $PT Eval Low Complexity: 1 Procedure PT Treatments $Gait Training: 8-22 mins   PT G Codes:        Greggory Stallion, PT, DPT [phone]   Patricia Dudley 12/20/2016, 2:44 PM

## 2016-12-20 NOTE — Evaluation (Signed)
Occupational Therapy Evaluation Patient Details Name: Patricia Dudley MRN: [id number] DOB: 01-26-1930 Today's Date: 12/20/2016    History of Present Illness Pt. is an 83 y.o. female who was admitted to [facility] with Right sided numbness, and slurred speech. Pt. PMHx includes: AFib, DM, Hyperlipidemia, HTN, Hypothroidism, Ischemic CVA, Malignant esstential HTN, GI Bleed, and Vertigo.    Clinical Impression   Pt. Is an 83 y.o. Female who was admitted to [facility] with right sided numbness, and slurred speech. Pt. Reports the numbness in her hand has improved, and that she does not have any this morning. BP is 165/60. HR is 100. Pt. Is able to use her hand to complete ADL tasks without difficulty. Sensation, and proprioceptive awareness are intact. Right, and left grip strength are 25#. No indication for further OT skilled services at this time.     Follow Up Recommendations  No OT follow up    Equipment Recommendations       Recommendations for Other Services       Precautions / Restrictions Restrictions Weight Bearing Restrictions: No      Mobility Bed Mobility Overal bed mobility: Independent                Transfers Overall transfer level: Needs assistance   Transfers: Sit to/from Stand Sit to Stand: Supervision              Balance                                           ADL either performed or assessed with clinical judgement   ADL Overall ADL's : Needs assistance/impaired Eating/Feeding: Set up   Grooming: Set up               Lower Body Dressing: Set up               Functional mobility during ADLs: Supervision/safety       Vision         Perception     Praxis      Pertinent Vitals/Pain Pain Assessment: 0-10 Pain Score: 0-No pain     Hand Dominance Right   Extremity/Trunk Assessment Upper Extremity Assessment Upper Extremity Assessment: Overall WFL for tasks assessed (Intact sesory, and proprioceptive  awareness.  Grip strength: RIght: 25#, Left 25#. )           Communication Communication Communication: No difficulties   Cognition Arousal/Alertness: Awake/alert Behavior During Therapy: WFL for tasks assessed/performed Overall Cognitive Status: Within Functional Limits for tasks assessed                                     General Comments       Exercises     Shoulder Instructions      Home Living Family/patient expects to be discharged to:: Private residence Living Arrangements: Alone;Children (Pt. reports her daughter sometimes stays with her.) Available Help at Discharge: Family Type of Home: House Home Access: Level entry     Home Layout: One level     Bathroom Shower/Tub: Walk-in Hydrologist: Standard Bathroom Accessibility: Yes   Home Equipment: Grab bars - tub/shower;Hand held shower head          Prior Functioning/Environment Level of Independence: Needs assistance    ADL's / [employer]  Needed: Independent with ADLs, IADLs, and limited driving.             OT Problem List:        OT Treatment/Interventions:      OT Goals(Current goals can be found in the care plan section) Acute Rehab OT Goals Patient Stated Goal: To return home OT Goal Formulation: With patient Potential to Achieve Goals: Good  OT Frequency:     Barriers to D/C:            Co-evaluation              End of Session Equipment Utilized During Treatment: Gait belt  Activity Tolerance: Patient tolerated treatment well Patient left: in bed;with call bell/phone within reach;with bed alarm set  OT Visit Diagnosis: Muscle weakness (generalized) (M62.81)                Time: 9179-1505 OT Time Calculation (min): 29 min Charges:  OT General Charges $OT Visit: 1 Procedure OT Evaluation $OT Eval Moderate Complexity: 1 Procedure G-Codes:    Harrel Carina, MS, OTR/L  Harrel Carina, MS, OTR/L 12/20/2016, 1:02  PM

## 2016-12-20 NOTE — H&P (Addendum)
Patricia Dudley at [place] NAME: Patricia Dudley    MR#:  [id number]  DATE OF BIRTH:  1930/02/18  DATE OF ADMISSION:  12/19/2016  PRIMARY CARE PHYSICIAN: Tracie Harrier, MD   REQUESTING/REFERRING PHYSICIAN:   CHIEF COMPLAINT:   Chief Complaint  Patient presents with  . Aphasia    HISTORY OF PRESENT ILLNESS: Patricia Dudley  is a 83 y.o. female with a known history of Atrial fibrillation, diabetes mellitus type 2, hyperlipidemia, hypertension, hypothyroidism was recently discharged yesterday to home after being diagnosed with ischemic CVA on aspirin. Patient seen and evaluated by me on 12/19/2016 in the emergency room. She went home and was found by family to have increased slurred speech. Patient also complains of some numbness in the fingers of right hand and hence patient was brought back to the emergency room again with the family. She was found to have elevated blood pressure systolic blood pressure around 30 mmHg. Case was discussed by ER physician with neurology attending who saw the patient in the last admission who advised the blood pressure to be kept around 1 60 mmHg systolic. Patient was worked up with MRI brain and carotid ultrasound prior to discharge on Thursday. Patient is awake and her speech appears to be more better in the emergency room. She responds to all verbal commands. She is awake and alert and oriented and moves all extremities. No complaints of any headache, dizziness and blurry vision. Blood pressure was better controlled in the emergency room. Hospitalist service was consulted for further care of the patient.  PAST MEDICAL HISTORY:   Past Medical History:  Diagnosis Date  . A-fib ([place])   . CVA (cerebral vascular accident) ([place])   . Diabetes mellitus without complication ([place])   . Hyperlipemia   . Hypertension   . Thyroid disease     PAST SURGICAL HISTORY: Past Surgical History:  Procedure Laterality Date  .  ESOPHAGOGASTRODUODENOSCOPY N/A 08/08/2016   Procedure: ESOPHAGOGASTRODUODENOSCOPY (EGD);  Surgeon: Patricia Silvas, MD;  Location: [facility] ENDOSCOPY;  Service: Endoscopy;  Laterality: N/A;  . EYE SURGERY     left    SOCIAL HISTORY:  Social History  Substance Use Topics  . Smoking status: Never Smoker  . Smokeless tobacco: Never Used  . Alcohol use No    FAMILY HISTORY:  Family History  Problem Relation Age of Onset  . Diabetes Mother   . CVA Mother   . Hypertension Mother   . Diabetes Sister   . Diabetes Brother     DRUG ALLERGIES:  Allergies  Allergen Reactions  . Iodine Rash    REVIEW OF SYSTEMS:   CONSTITUTIONAL: No fever, fatigue or weakness.  EYES: No blurred or double vision.  EARS, NOSE, AND THROAT: No tinnitus or ear pain.  RESPIRATORY: No cough, shortness of breath, wheezing or hemoptysis.  CARDIOVASCULAR: No chest pain, orthopnea, edema.  GASTROINTESTINAL: No nausea, vomiting, diarrhea or abdominal pain.  GENITOURINARY: No dysuria, hematuria.  ENDOCRINE: No polyuria, nocturia,  HEMATOLOGY: No anemia, easy bruising or bleeding SKIN: No rash or lesion. MUSCULOSKELETAL: No joint pain or arthritis.   NEUROLOGIC: No tingling, numbness of digits right hand with weakness.  Increased slurred speech PSYCHIATRY: No anxiety or depression.   MEDICATIONS AT HOME:  Prior to Admission medications   Medication Sig Start Date End Date Taking? Authorizing Provider  aspirin EC 325 MG tablet Take 1 tablet (325 mg total) by mouth daily. 12/19/16  Yes Patricia Bow, MD  atorvastatin (LIPITOR)  10 MG tablet Take 1 tablet (10 mg total) by mouth daily. 12/19/16  Yes Patricia Sudini, MD  docusate sodium (COLACE) 100 MG capsule Take 100 mg by mouth daily as needed for mild constipation.   Yes Historical Provider, MD  glipiZIDE (GLUCOTROL XL) 10 MG 24 hr tablet Take 10 mg by mouth daily.   Yes Historical Provider, MD  hydrOXYzine (ATARAX/VISTARIL) 25 MG tablet Take 25 mg by mouth every  evening.    Yes Historical Provider, MD  iron polysaccharides (NIFEREX) 150 MG capsule Take 150 mg by mouth daily.   Yes Historical Provider, MD  latanoprost (XALATAN) 0.005 % ophthalmic solution Place 1 drop into both eyes at bedtime.   Yes Historical Provider, MD  levothyroxine (SYNTHROID, LEVOTHROID) 112 MCG tablet Take 112 mcg by mouth daily.   Yes Historical Provider, MD  lisinopril (PRINIVIL,ZESTRIL) 40 MG tablet Take 1 tablet (40 mg total) by mouth daily. 07/27/15  Yes Patricia Costa, MD  vitamin B-12 (CYANOCOBALAMIN) 1000 MCG tablet Take 1,000 mcg by mouth daily.   Yes Historical Provider, MD  pantoprazole (PROTONIX) 40 MG tablet Take 1 tablet (40 mg total) by mouth 2 (two) times daily. Patient not taking: Reported on 12/18/2016 08/09/16   Patricia Sane, MD  sucralfate (CARAFATE) 1 g tablet Take 1 g by mouth 4 (four) times daily.    Historical Provider, MD      PHYSICAL EXAMINATION:   VITAL SIGNS: Blood pressure (!) 162/63, pulse 83, temperature 98.4 F (36.9 C), resp. rate 20, height 5\' 6"  (1.676 m), weight 63 kg (139 lb), SpO2 100 %.  GENERAL:  83 y.o.-year-old patient lying in the bed with no acute distress.  EYES: Pupils equal, round, reactive to light and accommodation. No scleral icterus. Extraocular muscles intact.  HEENT: Head atraumatic, normocephalic. Oropharynx and nasopharynx clear.  NECK:  Supple, no jugular venous distention. No thyroid enlargement, no tenderness.  LUNGS: Normal breath sounds bilaterally, no wheezing, rales,rhonchi or crepitation. No use of accessory muscles of respiration.  CARDIOVASCULAR: S1, S2 normal. No murmurs, rubs, or gallops.  ABDOMEN: Soft, nontender, nondistended. Bowel sounds present. No organomegaly or mass.  EXTREMITIES: No pedal edema, cyanosis, or clubbing.  NEUROLOGIC: Cranial nerves II through XII are intact. Muscle strength 5/5 all extremities. Sensation intact. Gait not checked. No cerebellar signs noted Slurred speech improved in  ER PSYCHIATRIC: The patient is alert and oriented x 3.  SKIN: No obvious rash, lesion, or ulcer.   LABORATORY PANEL:   CBC  Recent Labs Lab 12/18/16 1347 12/19/16 0409 12/19/16 2240  WBC 7.2 5.3 6.7  HGB 11.6* 10.5* 12.0  HCT 34.9* 31.2* 35.8  PLT 332 282 297  MCV 77.0* 76.5* 76.0*  MCH 25.5* 25.8* 25.4*  MCHC 33.2 33.7 33.4  RDW 18.6* 18.3* 18.4*  LYMPHSABS 1.8  --  1.2  MONOABS 0.5  --  0.6  EOSABS 0.3  --  0.3  BASOSABS 0.1  --  0.0   ------------------------------------------------------------------------------------------------------------------  Chemistries   Recent Labs Lab 12/18/16 1347 12/19/16 0409 12/19/16 2240  NA 140 142 139  K 3.4* 3.6 3.7  CL 104 109 104  CO2 27 27 27   GLUCOSE 231* 109* 165*  BUN 20 20 27*  CREATININE 1.74* 1.48* 1.78*  CALCIUM 8.4* 8.1* 8.6*  AST 21  --  19  ALT 13*  --  12*  ALKPHOS 74  --  73  BILITOT 0.9  --  0.8   ------------------------------------------------------------------------------------------------------------------ estimated creatinine clearance is 22 mL/min (A) (by C-G formula  based on SCr of 1.78 mg/dL (H)). ------------------------------------------------------------------------------------------------------------------ No results for input(s): TSH, T4TOTAL, T3FREE, THYROIDAB in the last 72 hours.  Invalid input(s): FREET3   Coagulation profile  Recent Labs Lab 12/18/16 1347  INR 1.06   ------------------------------------------------------------------------------------------------------------------- No results for input(s): DDIMER in the last 72 hours. -------------------------------------------------------------------------------------------------------------------  Cardiac Enzymes  Recent Labs Lab 12/18/16 1347 12/19/16 2240  TROPONINI <0.03 <0.03   ------------------------------------------------------------------------------------------------------------------ Invalid input(s):  POCBNP  ---------------------------------------------------------------------------------------------------------------  Urinalysis    Component Value Date/Time   COLORURINE STRAW (A) 08/07/2016 1630   APPEARANCEUR CLEAR (A) 08/07/2016 1630   LABSPEC 1.008 08/07/2016 1630   PHURINE 6.0 08/07/2016 1630   GLUCOSEU NEGATIVE 08/07/2016 1630   HGBUR NEGATIVE 08/07/2016 1630   BILIRUBINUR NEGATIVE 08/07/2016 1630   KETONESUR NEGATIVE 08/07/2016 1630   PROTEINUR 30 (A) 08/07/2016 1630   NITRITE NEGATIVE 08/07/2016 1630   LEUKOCYTESUR TRACE (A) 08/07/2016 1630     RADIOLOGY: Ct Head Wo Contrast  Result Date: 12/19/2016 CLINICAL DATA:  Slurred speech EXAM: CT HEAD WITHOUT CONTRAST TECHNIQUE: Contiguous axial images were obtained from the base of the skull through the vertex without intravenous contrast. COMPARISON:  MRI 12/19/2016, CT brain 12/18/2016 FINDINGS: Brain: No mass or hemorrhage is visualized. No territorial infarct. Extensive periventricular and subcortical white matter small vessel ischemic changes as before. Stable ventricle size. Vascular: No hyperdense vessels.  Carotid artery calcifications. Skull: No fracture.  No suspicious bone lesion Sinuses/Orbits: Mucosal thickening in the sphenoid and ethmoid sinuses. Other: None IMPRESSION: 1. No definite CT evidence for acute intracranial abnormality. MRI demonstrated infarcts in the left hemisphere are not well appreciated by CT. MRI follow-up may be performed as clinically indicated. 2. Extensive bilateral white matter small vessel ischemic changes. Electronically Signed   By: Donavan Foil M.D.   On: 12/19/2016 23:02   Mr Brain Wo Contrast  Result Date: 12/19/2016 CLINICAL DATA:  Right arm and hand numbness and slurred speech beginning yesterday. EXAM: MRI HEAD WITHOUT CONTRAST TECHNIQUE: Multiplanar, multiecho pulse sequences of the brain and surrounding structures were obtained without intravenous contrast. COMPARISON:  Head CT  12/18/2016.  MRI 07/10/2009 FINDINGS: Brain: Diffusion imaging shows 2 or 3 small foci of acute/ subacute infarction in the left hemisphere. There is a small focus within the left side of the corpus callosum. Two small foci are present at the left posterior frontal cortical and subcortical brain. No large or confluent infarction. Extensive chronic small-vessel ischemic changes affect the deep and subcortical white matter. Old lacunar infarction within the thalami. Chronic small-vessel ischemic changes within the pons. Old small vessel infarctions in the cerebellum. Old hemorrhagic infarction in the right cerebellum. Few other scattered foci of hemosiderin deposition in the cerebral hemispheres likely represent residual hemosiderin from old micro hemorrhagic insults. No hydrocephalus. No extra-axial collection. Vascular: Major vessels at the base of the brain show flow. Skull and upper cervical spine: Negative Sinuses/Orbits: Clear/normal Other: None significant IMPRESSION: Approximately 3 subcentimeter acute infarctions in the left hemisphere. There is involvement of the left side of the corpus callosum in the left posterior frontal cortical and subcortical brain. No large confluent infarction. Chronic small-vessel ischemic changes elsewhere throughout the brain as outlined above. Some of these are associated with hemosiderin deposition. Findings are progressive since 2010. Electronically Signed   By: Nelson Chimes M.D.   On: 12/19/2016 12:45   Ct Head Code Stroke Wo Contrast`  Result Date: 12/18/2016 CLINICAL DATA:  Code stroke. Right-sided numbness which began 4 hours ago. EXAM: CT HEAD WITHOUT CONTRAST TECHNIQUE:  Contiguous axial images were obtained from the base of the skull through the vertex without intravenous contrast. COMPARISON:  07/26/2015 FINDINGS: Brain: Age related atrophy. Extensive chronic small vessel ischemic changes throughout the cerebral hemispheric deep and subcortical white matter. No sign  of acute infarction, mass lesion, hemorrhage, hydrocephalus or extra-axial collection. Vascular: There is atherosclerotic calcification of the major vessels at the base of the brain. Skull: Negative Sinuses/Orbits: Some inflammatory changes of the left sphenoid sinus. Opacified left posterior ethmoid air cell. Other sinuses clear. Orbits negative. Other: None ASPECTS ([place] Stroke Program Early CT Score) - Ganglionic level infarction (caudate, lentiform nuclei, internal capsule, insula, M1-M3 cortex): 7 - Supraganglionic infarction (M4-M6 cortex): 3 Total score (0-10 with 10 being normal): 10 IMPRESSION: 1. No acute infarction. Atrophy an extensive chronic small vessel ischemic changes throughout the brain. 2. ASPECTS is 10. These results were called by telephone at the time of interpretation on 12/18/2016 at 1:54 pm to Dr. Lenise Arena , who verbally acknowledged these results. Electronically Signed   By: Nelson Chimes M.D.   On: 12/18/2016 13:56    EKG: Orders placed or performed during the hospital encounter of 12/19/16  . EKG 12-Lead  . EKG 12-Lead    IMPRESSION AND PLAN: 83 year old elderly female patient with history of diabetes mellitus, hypertension, hyperlipidemia, ischemic CVA left-sided presented to the emergency room with increased slurred speech elevated blood pressure. Admitting diagnosis 1. Hypertensive urgency 2. Left-sided ischemic CVA 3.Dysphasia 4. Hyperlipidemia 5. Type 2 diabetes mellitus Treatment plan Admit patient to medical floor with cardiac monitoring observation bed Control blood pressure with oral ACE inhibitor and when necessary IV hydralazine Keep systolic blood pressure around 1 60 mmHg Neurology consultation and follow-up Resume aspirin and statin medication Resume diabetic medication Speech therapy and physical therapy Reassess patient whether she will need a SNF placement Supportive care  All the records are reviewed and case discussed with ED  provider. Management plans discussed with the patient, family and they are in agreement.  CODE STATUS:DNR Code Status History    Date Active Date Inactive Code Status Order ID Comments User Context   12/18/2016  3:31 PM 12/19/2016  9:43 PM DNR [id number]  Henreitta Leber, MD ED   08/07/2016  5:58 PM 08/09/2016  2:29 PM Full Code [id number]  Vaughan Basta, MD Inpatient   07/26/2015  1:55 PM 07/27/2015  6:32 PM Full Code [id number]  Theodoro Grist, MD Inpatient    Questions for Most Recent Historical Code Status (Order [id number])    Question Answer Comment   In the event of cardiac or respiratory ARREST Do not call a "code blue"    In the event of cardiac or respiratory ARREST Do not perform Intubation, CPR, defibrillation or ACLS    In the event of cardiac or respiratory ARREST Use medication by any route, position, wound care, and other measures to relive pain and suffering. May use oxygen, suction and manual treatment of airway obstruction as needed for comfort.         Advance Directive Documentation     Most Recent Value  Type of Advance Directive  Living will  Pre-existing out of facility DNR order (yellow form or pink MOST form)  -  "MOST" Form in Place?  -       TOTAL TIME TAKING CARE OF THIS PATIENT: 50 minutes.    Saundra Shelling M.D on 12/20/2016 at 1:02 AM  Between 7am to 6pm - Pager - [phone]  After 6pm go to [email or web address] -  password [email or web address] [facility]  [place] [facility]  Office  [phone]  CC: Primary care physician; Tracie Harrier, MD

## 2016-12-20 NOTE — Progress Notes (Signed)
SLP Cancellation Note  Patient Details Name: Patricia Dudley MRN: [id number] DOB: Jan 11, 1930   Cancelled treatment:       Reason Eval/Treat Not Completed: SLP screened, no needs identified, will sign off (chart reviewed; consulted NSG then pt and daughter).  Pt denied any difficulty swallowing and is currently on a regular diet; tolerates swallowing pills w/ water per NSG. Pt conversed at conversational level w/out overt deficits noted; pt and family denied any speech-language deficits stating "it's much better now".  No further skilled ST services indicated as pt appears at her baseline. Pt agreed. NSG to reconsult if any change in status.    Orinda Kenner, MS, CCC-SLP Watson,Katherine 12/20/2016, 11:12 AM

## 2016-12-20 NOTE — Discharge Instructions (Signed)
Resume diet and activity as before ° ° °

## 2016-12-20 NOTE — Discharge Summary (Signed)
[facility] NAME: Patricia Dudley    MR#:  [id number]  DATE OF BIRTH:  Apr 01, 1930  DATE OF ADMISSION:  12/18/2016 ADMITTING PHYSICIAN: Henreitta Leber, MD  DATE OF DISCHARGE: 12/19/2016  6:28 PM  PRIMARY CARE PHYSICIAN: HANDE,VISHWANATH, MD   ADMISSION DIAGNOSIS:  Other specified transient cerebral ischemias [G45.8] Numbness [R20.0]  DISCHARGE DIAGNOSIS:  Active Problems:   TIA (transient ischemic attack)   SECONDARY DIAGNOSIS:   Past Medical History:  Diagnosis Date  . A-fib ([place])   . CVA (cerebral vascular accident) ([place])   . Diabetes mellitus without complication ([place])   . Hyperlipemia   . Hypertension   . Thyroid disease      ADMITTING HISTORY  HISTORY OF PRESENT ILLNESS:  Patricia Dudley  is a 83 y.o. female with a known history of Atrial fibrillation, diabetes, hypertension, hyperlipidemia who presents to the hospital due to right arm and hand numbness along with some slurred speech. As per the daughter who gives most of the history patient earlier this afternoon developed some right hand numbness which then progressed to the entire right arm and then she also had some right leg tingling and numbness. She also complained of a headache along with the numbness, and also had some slight slurred speech as per the daughter. She was brought to the ER for further evaluation. Her numbness on the right side has improved and her slurred speech has also slightly improved but is not back to baseline yet. Given her transient neurologic symptoms hospitalist services were contacted further treatment and evaluation.   HOSPITAL COURSE:   * Three small acute infarcts in the left frontal white matter and corpus callosum  Patient was admitted onto medical floor with telemetry monitoring and neuro checks. Passed swallow screen. Her symptoms resolved completely during the hospital stay. Ambulated in all day without any problem. Did not  need any rehabilitation services at discharge. Echocardiogram showed no intramural thrombus. Carotid Dopplers showed no stenosis. She was taking a baby aspirin a. A statin was added to this. With recent GI bleed up patient's aspirin is being increased to 325 mg. She will follow up with cardiology as outpatient to be considered for stronger anticoagulation. Hemoglobin remained stable and no bleeding.  * Hypertension. Patient's blood pressure is elevated likely due to acute stroke. A dose on lisinopril has been increased. Should improve in the next 48-72 hours.  Advised to return to emergency room if symptoms recur or if there is any worsening.  CONSULTS OBTAINED:  Treatment Team:  Leotis Pain, MD  DRUG ALLERGIES:   Allergies  Allergen Reactions  . Iodine Rash    DISCHARGE MEDICATIONS:   Discharge Medication List as of 12/19/2016  6:02 PM    CONTINUE these medications which have CHANGED   Details  aspirin EC 325 MG tablet Take 1 tablet (325 mg total) by mouth daily., Starting Thu 12/19/2016, Normal    atorvastatin (LIPITOR) 10 MG tablet Take 1 tablet (10 mg total) by mouth daily., Starting Thu 12/19/2016, Normal      CONTINUE these medications which have NOT CHANGED   Details  docusate sodium (COLACE) 100 MG capsule Take 100 mg by mouth daily as needed for mild constipation., Historical Med    glipiZIDE (GLUCOTROL XL) 10 MG 24 hr tablet Take 10 mg by mouth daily., Historical Med    hydrOXYzine (ATARAX/VISTARIL) 25 MG tablet Take 25 mg by mouth every evening. , Historical Med    iron  polysaccharides (NIFEREX) 150 MG capsule Take 150 mg by mouth daily., Historical Med    latanoprost (XALATAN) 0.005 % ophthalmic solution Place 1 drop into both eyes at bedtime., Historical Med    levothyroxine (SYNTHROID, LEVOTHROID) 112 MCG tablet Take 112 mcg by mouth daily., Historical Med    lisinopril (PRINIVIL,ZESTRIL) 40 MG tablet Take 1 tablet (40 mg total) by mouth daily., Starting Thu  07/27/2015, Normal    vitamin B-12 (CYANOCOBALAMIN) 1000 MCG tablet Take 1,000 mcg by mouth daily., Historical Med    sucralfate (CARAFATE) 1 g tablet Take 1 g by mouth 4 (four) times daily., Historical Med    pantoprazole (PROTONIX) 40 MG tablet Take 1 tablet (40 mg total) by mouth 2 (two) times daily., Starting Fri 08/09/2016, Normal        Today   VITAL SIGNS:  Blood pressure (!) 173/83, pulse (!) 25, temperature 97.4 F (36.3 C), temperature source Oral, resp. rate 18, height 5\' 6"  (1.676 m), weight 62.6 kg (138 lb), SpO2 100 %.  I/O:  No intake or output data in the 24 hours ending 12/20/16 1714  PHYSICAL EXAMINATION:  Physical Exam  GENERAL:  83 y.o.-year-old patient lying in the bed with no acute distress.  LUNGS: Normal breath sounds bilaterally, no wheezing, rales,rhonchi or crepitation. No use of accessory muscles of respiration.  CARDIOVASCULAR: S1, S2 normal. No murmurs, rubs, or gallops.  ABDOMEN: Soft, non-tender, non-distended. Bowel sounds present. No organomegaly or mass.  NEUROLOGIC: Moves all 4 extremities. PSYCHIATRIC: The patient is alert and oriented x 3.  SKIN: No obvious rash, lesion, or ulcer.   DATA REVIEW:   CBC  Recent Labs Lab 12/19/16 2240  WBC 6.7  HGB 12.0  HCT 35.8  PLT 297    Chemistries   Recent Labs Lab 12/19/16 2240  NA 139  K 3.7  CL 104  CO2 27  GLUCOSE 165*  BUN 27*  CREATININE 1.78*  CALCIUM 8.6*  AST 19  ALT 12*  ALKPHOS 73  BILITOT 0.8    Cardiac Enzymes  Recent Labs Lab 12/20/16 0512  TROPONINI <0.03    Microbiology Results  No results found for this or any previous visit.  RADIOLOGY:  Ct Head Wo Contrast  Result Date: 12/19/2016 CLINICAL DATA:  Slurred speech EXAM: CT HEAD WITHOUT CONTRAST TECHNIQUE: Contiguous axial images were obtained from the base of the skull through the vertex without intravenous contrast. COMPARISON:  MRI 12/19/2016, CT brain 12/18/2016 FINDINGS: Brain: No mass or  hemorrhage is visualized. No territorial infarct. Extensive periventricular and subcortical white matter small vessel ischemic changes as before. Stable ventricle size. Vascular: No hyperdense vessels.  Carotid artery calcifications. Skull: No fracture.  No suspicious bone lesion Sinuses/Orbits: Mucosal thickening in the sphenoid and ethmoid sinuses. Other: None IMPRESSION: 1. No definite CT evidence for acute intracranial abnormality. MRI demonstrated infarcts in the left hemisphere are not well appreciated by CT. MRI follow-up may be performed as clinically indicated. 2. Extensive bilateral white matter small vessel ischemic changes. Electronically Signed   By: Donavan Foil M.D.   On: 12/19/2016 23:02   Mr Brain Wo Contrast  Result Date: 12/20/2016 CLINICAL DATA:  Recent infarct in the left cerebrum. Worsening status at home, particularly slurred speech, but now back of prior baseline. EXAM: MRI HEAD WITHOUT CONTRAST TECHNIQUE: Multiplanar, multiecho pulse sequences of the brain and surrounding structures were obtained without intravenous contrast. COMPARISON:  Yesterday FINDINGS: Brain: 3 subcentimeter acute infarcts in the left cerebrum, 2 subcortical in the posterior frontal  region and the other along the left corpus callosum. No progression since prior. Negative for hemorrhage. Chronic microvascular ischemia with extensive gliosis in the cerebral white matter. Chronic lacune in the right frontal white matter. Mild patchy microvascular ischemic change in the pons. Few scattered remote hemorrhagic foci, largest in the deep right cerebellum. Normal brain volume for age. No acute hemorrhage, hydrocephalus, or mass. Vascular: Major flow voids are preserved. Skull and upper cervical spine: No marrow lesion. Hyperostosis interna. Sinuses/Orbits: No acute finding.  Bilateral cataract resection. IMPRESSION: 1. Three small acute infarcts in the left frontal white matter and corpus callosum, stable from yesterday.  No progressive infarct or acute hemorrhage. 2. Extensive chronic small vessel ischemia. Electronically Signed   By: Monte Fantasia M.D.   On: 12/20/2016 12:11   Mr Brain Wo Contrast  Result Date: 12/19/2016 CLINICAL DATA:  Right arm and hand numbness and slurred speech beginning yesterday. EXAM: MRI HEAD WITHOUT CONTRAST TECHNIQUE: Multiplanar, multiecho pulse sequences of the brain and surrounding structures were obtained without intravenous contrast. COMPARISON:  Head CT 12/18/2016.  MRI 07/10/2009 FINDINGS: Brain: Diffusion imaging shows 2 or 3 small foci of acute/ subacute infarction in the left hemisphere. There is a small focus within the left side of the corpus callosum. Two small foci are present at the left posterior frontal cortical and subcortical brain. No large or confluent infarction. Extensive chronic small-vessel ischemic changes affect the deep and subcortical white matter. Old lacunar infarction within the thalami. Chronic small-vessel ischemic changes within the pons. Old small vessel infarctions in the cerebellum. Old hemorrhagic infarction in the right cerebellum. Few other scattered foci of hemosiderin deposition in the cerebral hemispheres likely represent residual hemosiderin from old micro hemorrhagic insults. No hydrocephalus. No extra-axial collection. Vascular: Major vessels at the base of the brain show flow. Skull and upper cervical spine: Negative Sinuses/Orbits: Clear/normal Other: None significant IMPRESSION: Approximately 3 subcentimeter acute infarctions in the left hemisphere. There is involvement of the left side of the corpus callosum in the left posterior frontal cortical and subcortical brain. No large confluent infarction. Chronic small-vessel ischemic changes elsewhere throughout the brain as outlined above. Some of these are associated with hemosiderin deposition. Findings are progressive since 2010. Electronically Signed   By: Nelson Chimes M.D.   On: 12/19/2016 12:45    US Carotid Bilateral  Result Date: 12/20/2016 CLINICAL DATA:  Cerebral infarction. EXAM: BILATERAL CAROTID DUPLEX ULTRASOUND TECHNIQUE: Pearline Cables scale imaging, color Doppler and duplex ultrasound were performed of bilateral carotid and vertebral arteries in the neck. COMPARISON:  MRI 12/19/2016.  Ultrasound 07/26/2015. FINDINGS: Criteria: Quantification of carotid stenosis is based on velocity parameters that correlate the residual internal carotid diameter with NASCET-based stenosis levels, using the diameter of the distal internal carotid lumen as the denominator for stenosis measurement. The following velocity measurements were obtained: RIGHT ICA:  97/18 cm/sec CCA:  25/95 cm/sec SYSTOLIC ICA/CCA RATIO:  1.0 DIASTOLIC ICA/CCA RATIO:  1.5 ECA:  82 cm/sec LEFT ICA:  87/23 cm/sec CCA:  63/87 cm/sec SYSTOLIC ICA/CCA RATIO:  1.0 DIASTOLIC ICA/CCA RATIO:  1.7 ECA:  76 cm/sec RIGHT CAROTID ARTERY: Minimal right carotid atherosclerotic vascular plaque. No flow limiting stenosis. Degree of stenosis less than 50% . RIGHT VERTEBRAL ARTERY:  Patent with antegrade flow. LEFT CAROTID ARTERY: Minimal left carotid atherosclerotic vascular plaque. No flow limiting stenosis. Degree of stenosis less than 50%. LEFT VERTEBRAL ARTERY:  Patent with antegrade flow. IMPRESSION: 1. Minimal bilateral carotid atherosclerotic vascular plaque. Similar findings on prior exam. No hemodynamically  significant stenosis. Degree of stenosis less than 50% bilaterally. 2. Vertebrals are patent with antegrade flow. Electronically Signed   By: Marcello Moores  Register   On: 12/20/2016 06:20    Follow up with PCP in 1 week.  Management plans discussed with the patient, family and they are in agreement.  CODE STATUS:  Code Status History    Date Active Date Inactive Code Status Order ID Comments User Context   12/18/2016  3:31 PM 12/19/2016  9:43 PM DNR [id number]  Henreitta Leber, MD ED   08/07/2016  5:58 PM 08/09/2016  2:29 PM Full Code [id number]   Vaughan Basta, MD Inpatient   07/26/2015  1:55 PM 07/27/2015  6:32 PM Full Code [id number]  Theodoro Grist, MD Inpatient    Advance Directive Documentation     Most Recent Value  Type of Advance Directive  Healthcare Power of [place], Out of facility DNR (pink MOST or yellow form), Living will  Pre-existing out of facility DNR order (yellow form or pink MOST form)  Physician notified to receive inpatient order  "MOST" Form in Place?  -      TOTAL TIME TAKING CARE OF THIS PATIENT ON DAY OF DISCHARGE: more than 30 minutes.   Hillary Bow R M.D on 12/20/2016 at 5:14 PM  Between 7am to 6pm - Pager - [phone]  After 6pm go to [email or web address] - password [email or web address] [facility]  Office  [phone]  CC: Primary care physician; Tracie Harrier, MD  Note: This dictation was prepared with Dragon dictation along with smaller phrase technology. Any transcriptional errors that result from this process are unintentional.

## 2016-12-20 NOTE — Progress Notes (Signed)
Patient became hypertensive this shift, PRN hydralazine 10mg  IV given, patient also complained of chest pain, MD notified, Troponin, EKG and Nitro patch ordered per MD, chest pain improved, BP improved.

## 2016-12-20 NOTE — ED Notes (Signed)
Pt transport to 105

## 2016-12-20 NOTE — Consult Note (Addendum)
Referring Physician: Sudini    Chief Complaint: Right hand numbness and slurred speech  HPI: Patricia Dudley is an 83 y.o. female recently admitted for embolic infarcts with atrial fibrillation.  Was scheduled to go on anticoagulation after a week.  After getting home on yesterday had acute onset of numbness in the last three fingers on the right hand and slurred speech.  Patient returned for evaluation at that time.  She feels she is now back to baseline.  Initial NIHSS of 0.  Date last known well: Date: 12/19/2016 Time last known well: Time: 22:00 tPA Given: No: Recent infarcts, resolution of symptoms  Past Medical History:  Diagnosis Date  . A-fib ([place])   . CVA (cerebral vascular accident) ([place])   . Diabetes mellitus without complication ([place])   . Hyperlipemia   . Hypertension   . Thyroid disease     Past Surgical History:  Procedure Laterality Date  . ESOPHAGOGASTRODUODENOSCOPY N/A 08/08/2016   Procedure: ESOPHAGOGASTRODUODENOSCOPY (EGD);  Surgeon: [name], MD;  Location: [facility] ENDOSCOPY;  Service: Endoscopy;  Laterality: N/A;  . EYE SURGERY     left    Family History  Problem Relation Age of Onset  . Diabetes Mother   . CVA Mother   . Hypertension Mother   . Diabetes Sister   . Diabetes Brother    Social History:  reports that she has never smoked. She has never used smokeless tobacco. She reports that she does not drink alcohol or use drugs.  Allergies:  Allergies  Allergen Reactions  . Iodine Rash    Medications:  I have reviewed the patient's current medications. Prior to Admission:  Prescriptions Prior to Admission  Medication Sig Dispense Refill Last Dose  . aspirin EC 325 MG tablet Take 1 tablet (325 mg total) by mouth daily. 30 tablet 0 12/19/2016 at Unknown time  . atorvastatin (LIPITOR) 10 MG tablet Take 1 tablet (10 mg total) by mouth daily. 30 tablet 0   . docusate sodium (COLACE) 100 MG capsule Take 100 mg by mouth daily as needed for mild  constipation.   prn at prn  . glipiZIDE (GLUCOTROL XL) 10 MG 24 hr tablet Take 10 mg by mouth daily.   08/07/2016 at Unknown time  . hydrOXYzine (ATARAX/VISTARIL) 25 MG tablet Take 25 mg by mouth every evening.      . iron polysaccharides (NIFEREX) 150 MG capsule Take 150 mg by mouth daily.     Marland Kitchen latanoprost (XALATAN) 0.005 % ophthalmic solution Place 1 drop into both eyes at bedtime.   08/06/2016 at Unknown time  . levothyroxine (SYNTHROID, LEVOTHROID) 112 MCG tablet Take 112 mcg by mouth daily.   08/07/2016 at Unknown time  . lisinopril (PRINIVIL,ZESTRIL) 40 MG tablet Take 1 tablet (40 mg total) by mouth daily. 30 tablet 0 12/19/2016 at Unknown time  . vitamin B-12 (CYANOCOBALAMIN) 1000 MCG tablet Take 1,000 mcg by mouth daily.   07/25/2015 at am  . pantoprazole (PROTONIX) 40 MG tablet Take 1 tablet (40 mg total) by mouth 2 (two) times daily. (Patient not taking: Reported on 12/18/2016) 60 tablet 0 Not Taking at Unknown time  . sucralfate (CARAFATE) 1 g tablet Take 1 g by mouth 4 (four) times daily.   Not Taking at Unknown time   Scheduled: . amLODipine  5 mg Oral Daily  . aspirin EC  325 mg Oral Daily  . atorvastatin  10 mg Oral Daily  . glipiZIDE  10 mg Oral Daily  . hydrOXYzine  25 mg Oral QPM  . iron polysaccharides  150 mg Oral Daily  . labetalol  10 mg Intravenous Once  . latanoprost  1 drop Both Eyes QHS  . levothyroxine  112 mcg Oral QAC breakfast  . lisinopril  40 mg Oral Daily  . pantoprazole  40 mg Oral BID  . sucralfate  1 g Oral QID  . vitamin B-12  1,000 mcg Oral Daily    ROS: History obtained from the patient  General ROS: negative for - chills, fatigue, fever, night sweats, weight gain or weight loss Psychological ROS: negative for - behavioral disorder, hallucinations, memory difficulties, mood swings or suicidal ideation Ophthalmic ROS: negative for - blurry vision, double vision, eye pain or loss of vision ENT ROS: negative for - epistaxis, nasal discharge, oral  lesions, sore throat, tinnitus or vertigo Allergy and Immunology ROS: negative for - hives or itchy/watery eyes Hematological and Lymphatic ROS: negative for - bleeding problems, bruising or swollen lymph nodes Endocrine ROS: negative for - galactorrhea, hair pattern changes, polydipsia/polyuria or temperature intolerance Respiratory ROS: negative for - cough, hemoptysis, shortness of breath or wheezing Cardiovascular ROS: negative for - chest pain, dyspnea on exertion, edema or irregular heartbeat Gastrointestinal ROS: negative for - abdominal pain, diarrhea, hematemesis, nausea/vomiting or stool incontinence Genito-Urinary ROS: negative for - dysuria, hematuria, incontinence or urinary frequency/urgency Musculoskeletal ROS: negative for - joint swelling or muscular weakness Neurological ROS: as noted in HPI Dermatological ROS: negative for rash and skin lesion changes  Physical Examination: Blood pressure (!) 165/60, pulse (!) 46, temperature 98 F (36.7 C), temperature source Oral, resp. rate (!) 100, height 5\' 6"  (1.676 m), weight 62.8 kg (138 lb 6.4 oz), SpO2 100 %.  HEENT-  Normocephalic, no lesions, without obvious abnormality.  Normal external eye and conjunctiva.  Normal TM's bilaterally.  Normal auditory canals and external ears. Normal external nose, mucus membranes and septum.  Normal pharynx. Cardiovascular- S1, S2 normal, pulses palpable throughout   Lungs- chest clear, no wheezing, rales, normal symmetric air entry Abdomen- soft, non-tender; bowel sounds normal; no masses,  no organomegaly Extremities- no edema Lymph-no adenopathy palpable Musculoskeletal-no joint tenderness, deformity or swelling Skin-warm and dry, no hyperpigmentation, vitiligo, or suspicious lesions  Neurological Examination   Mental Status: Alert, oriented, thought content appropriate.  Speech fluent without evidence of aphasia.  Able to follow 3 step commands without difficulty. Cranial Nerves: II:  Discs flat bilaterally; Visual fields grossly normal, pupils equal, round, reactive to light and accommodation III,IV, VI: left ptosis, extra-ocular motions intact bilaterally V,VII: smile symmetric, facial light touch sensation normal bilaterally VIII: hearing normal bilaterally IX,X: gag reflex present XI: bilateral shoulder shrug XII: midline tongue extension Motor: Right : Upper extremity   5-/5 With pronator drift   Left:     Upper extremity   5/5  Lower extremity   5/5       Lower extremity   5/5 Tone and bulk:normal tone throughout; no atrophy noted Sensory: Pinprick and light touch intact throughout, bilaterally Deep Tendon Reflexes: 2+ and symmetric with absent AJ's bilaterally Plantars: Right: upgoing   Left: upgoing Cerebellar: Normal finger-to-nose and normal heel-to-shin testing bilaterally Gait: not tested due to safety concerns    Laboratory Studies:  Basic Metabolic Panel:  Recent Labs Lab 12/18/16 1347 12/19/16 0409 12/19/16 2240  NA 140 142 139  K 3.4* 3.6 3.7  CL 104 109 104  CO2 27 27 27   GLUCOSE 231* 109* 165*  BUN 20 20 27*  CREATININE 1.74*  1.48* 1.78*  CALCIUM 8.4* 8.1* 8.6*    Liver Function Tests:  Recent Labs Lab 12/18/16 1347 12/19/16 2240  AST 21 19  ALT 13* 12*  ALKPHOS 74 73  BILITOT 0.9 0.8  PROT 7.4 7.2  ALBUMIN 3.5 3.3*   No results for input(s): LIPASE, AMYLASE in the last 168 hours. No results for input(s): AMMONIA in the last 168 hours.  CBC:  Recent Labs Lab 12/18/16 1347 12/19/16 0409 12/19/16 2240  WBC 7.2 5.3 6.7  NEUTROABS 4.6  --  4.6  HGB 11.6* 10.5* 12.0  HCT 34.9* 31.2* 35.8  MCV 77.0* 76.5* 76.0*  PLT 332 282 297    Cardiac Enzymes:  Recent Labs Lab 12/18/16 1347 12/19/16 2240 12/20/16 0512  TROPONINI <0.03 <0.03 <0.03    BNP: Invalid input(s): POCBNP  CBG:  Recent Labs Lab 12/18/16 1338 12/19/16 0738  GLUCAP 213* 94    Microbiology: No results found for this or any previous  visit.  Coagulation Studies:  Recent Labs  12/18/16 1347  LABPROT 13.8  INR 1.06    Urinalysis: No results for input(s): COLORURINE, LABSPEC, PHURINE, GLUCOSEU, HGBUR, BILIRUBINUR, KETONESUR, PROTEINUR, UROBILINOGEN, NITRITE, LEUKOCYTESUR in the last 168 hours.  Invalid input(s): APPERANCEUR  Lipid Panel: No results found for: CHOL, TRIG, HDL, CHOLHDL, VLDL, LDLCALC  HgbA1C:  Lab Results  Component Value Date   HGBA1C 6.5 (H) 07/26/2015    Urine Drug Screen:  No results found for: LABOPIA, COCAINSCRNUR, LABBENZ, AMPHETMU, THCU, LABBARB  Alcohol Level: No results for input(s): ETH in the last 168 hours.  Other results: EKG: sinus tachycardia at 103 bpm.  Imaging: Ct Head Wo Contrast  Result Date: 12/19/2016 CLINICAL DATA:  Slurred speech EXAM: CT HEAD WITHOUT CONTRAST TECHNIQUE: Contiguous axial images were obtained from the base of the skull through the vertex without intravenous contrast. COMPARISON:  MRI 12/19/2016, CT brain 12/18/2016 FINDINGS: Brain: No mass or hemorrhage is visualized. No territorial infarct. Extensive periventricular and subcortical white matter small vessel ischemic changes as before. Stable ventricle size. Vascular: No hyperdense vessels.  Carotid artery calcifications. Skull: No fracture.  No suspicious bone lesion Sinuses/Orbits: Mucosal thickening in the sphenoid and ethmoid sinuses. Other: None IMPRESSION: 1. No definite CT evidence for acute intracranial abnormality. MRI demonstrated infarcts in the left hemisphere are not well appreciated by CT. MRI follow-up may be performed as clinically indicated. 2. Extensive bilateral white matter small vessel ischemic changes. Electronically Signed   By: Donavan Foil M.D.   On: 12/19/2016 23:02   Mr Brain Wo Contrast  Result Date: 12/19/2016 CLINICAL DATA:  Right arm and hand numbness and slurred speech beginning yesterday. EXAM: MRI HEAD WITHOUT CONTRAST TECHNIQUE: Multiplanar, multiecho pulse sequences of  the brain and surrounding structures were obtained without intravenous contrast. COMPARISON:  Head CT 12/18/2016.  MRI 07/10/2009 FINDINGS: Brain: Diffusion imaging shows 2 or 3 small foci of acute/ subacute infarction in the left hemisphere. There is a small focus within the left side of the corpus callosum. Two small foci are present at the left posterior frontal cortical and subcortical brain. No large or confluent infarction. Extensive chronic small-vessel ischemic changes affect the deep and subcortical white matter. Old lacunar infarction within the thalami. Chronic small-vessel ischemic changes within the pons. Old small vessel infarctions in the cerebellum. Old hemorrhagic infarction in the right cerebellum. Few other scattered foci of hemosiderin deposition in the cerebral hemispheres likely represent residual hemosiderin from old micro hemorrhagic insults. No hydrocephalus. No extra-axial collection. Vascular: Major vessels at  the base of the brain show flow. Skull and upper cervical spine: Negative Sinuses/Orbits: Clear/normal Other: None significant IMPRESSION: Approximately 3 subcentimeter acute infarctions in the left hemisphere. There is involvement of the left side of the corpus callosum in the left posterior frontal cortical and subcortical brain. No large confluent infarction. Chronic small-vessel ischemic changes elsewhere throughout the brain as outlined above. Some of these are associated with hemosiderin deposition. Findings are progressive since 2010. Electronically Signed   By: Nelson Chimes M.D.   On: 12/19/2016 12:45   US Carotid Bilateral  Result Date: 12/20/2016 CLINICAL DATA:  Cerebral infarction. EXAM: BILATERAL CAROTID DUPLEX ULTRASOUND TECHNIQUE: Pearline Cables scale imaging, color Doppler and duplex ultrasound were performed of bilateral carotid and vertebral arteries in the neck. COMPARISON:  MRI 12/19/2016.  Ultrasound 07/26/2015. FINDINGS: Criteria: Quantification of carotid stenosis is  based on velocity parameters that correlate the residual internal carotid diameter with NASCET-based stenosis levels, using the diameter of the distal internal carotid lumen as the denominator for stenosis measurement. The following velocity measurements were obtained: RIGHT ICA:  97/18 cm/sec CCA:  27/51 cm/sec SYSTOLIC ICA/CCA RATIO:  1.0 DIASTOLIC ICA/CCA RATIO:  1.5 ECA:  82 cm/sec LEFT ICA:  87/23 cm/sec CCA:  70/01 cm/sec SYSTOLIC ICA/CCA RATIO:  1.0 DIASTOLIC ICA/CCA RATIO:  1.7 ECA:  76 cm/sec RIGHT CAROTID ARTERY: Minimal right carotid atherosclerotic vascular plaque. No flow limiting stenosis. Degree of stenosis less than 50% . RIGHT VERTEBRAL ARTERY:  Patent with antegrade flow. LEFT CAROTID ARTERY: Minimal left carotid atherosclerotic vascular plaque. No flow limiting stenosis. Degree of stenosis less than 50%. LEFT VERTEBRAL ARTERY:  Patent with antegrade flow. IMPRESSION: 1. Minimal bilateral carotid atherosclerotic vascular plaque. Similar findings on prior exam. No hemodynamically significant stenosis. Degree of stenosis less than 50% bilaterally. 2. Vertebrals are patent with antegrade flow. Electronically Signed   By: Marcello Moores  Register   On: 12/20/2016 06:20   Ct Head Code Stroke Wo Contrast`  Result Date: 12/18/2016 CLINICAL DATA:  Code stroke. Right-sided numbness which began 4 hours ago. EXAM: CT HEAD WITHOUT CONTRAST TECHNIQUE: Contiguous axial images were obtained from the base of the skull through the vertex without intravenous contrast. COMPARISON:  07/26/2015 FINDINGS: Brain: Age related atrophy. Extensive chronic small vessel ischemic changes throughout the cerebral hemispheric deep and subcortical white matter. No sign of acute infarction, mass lesion, hemorrhage, hydrocephalus or extra-axial collection. Vascular: There is atherosclerotic calcification of the major vessels at the base of the brain. Skull: Negative Sinuses/Orbits: Some inflammatory changes of the left sphenoid sinus.  Opacified left posterior ethmoid air cell. Other sinuses clear. Orbits negative. Other: None ASPECTS ([place] Stroke Program Early CT Score) - Ganglionic level infarction (caudate, lentiform nuclei, internal capsule, insula, M1-M3 cortex): 7 - Supraganglionic infarction (M4-M6 cortex): 3 Total score (0-10 with 10 being normal): 10 IMPRESSION: 1. No acute infarction. Atrophy an extensive chronic small vessel ischemic changes throughout the brain. 2. ASPECTS is 10. These results were called by telephone at the time of interpretation on 12/18/2016 at 1:54 pm to Dr. Lenise Arena , who verbally acknowledged these results. Electronically Signed   By: Nelson Chimes M.D.   On: 12/18/2016 13:56    Assessment: 83 y.o. female with recent diagnosis of embolic infarcts and history of atrial fibrillation.  Scheduled to start anticoagulation next week.  Discharged on yesterday and had symptoms of right hand numbness and slurred speech on returning home.  Patient on ASA.  Recent work up includes carotid dopplers showing no evidence of hemodynamically significant  stenosis.  Echocardiogram showing no cardiac source of emboli with an EF of 55-60%.    Stroke Risk Factors - atrial fibrillation, diabetes mellitus, hyperlipidemia and hypertension  Plan: 1. HgbA1c, fasting lipid panel 2. MRI of the brain without contrast 3. PT consult, OT consult, Speech consult 4. Prophylactic therapy-Continue ASA with further recommendations to be made based on MR findings.   5. NPO until RN stroke swallow screen 6. Telemetry monitoring 7. Frequent neuro checks   Alexis Goodell, MD Neurology [phone] 12/20/2016, 11:06 AM  Addendum: MRI of the brain reviewed and shows no new lesions from the previous imaging.  Would continue previous plan of ASA and follow up next week for consideration of anticoagulation.    Alexis Goodell, MD Neurology [phone]

## 2016-12-21 LAB — HEMOGLOBIN A1C
Hgb A1c MFr Bld: 6.4 % — ABNORMAL HIGH (ref 4.8–5.6)
Mean Plasma Glucose: 137 mg/dL

## 2017-02-27 ENCOUNTER — Inpatient Hospital Stay
Admission: EM | Admit: 2017-02-27 | Discharge: 2017-03-01 | DRG: 378 | Disposition: A | Discharge status: Home / Self Care | Payer: Medicare Other | Attending: Specialist | Admitting: Specialist

## 2017-02-27 ENCOUNTER — Encounter: Payer: Self-pay | Admitting: *Deleted

## 2017-02-27 DIAGNOSIS — Z8673 Personal history of transient ischemic attack (TIA), and cerebral infarction without residual deficits: Secondary | ICD-10-CM | POA: Diagnosis not present

## 2017-02-27 DIAGNOSIS — E785 Hyperlipidemia, unspecified: Secondary | ICD-10-CM | POA: Diagnosis present

## 2017-02-27 DIAGNOSIS — K921 Melena: Secondary | ICD-10-CM | POA: Diagnosis present

## 2017-02-27 DIAGNOSIS — I10 Essential (primary) hypertension: Secondary | ICD-10-CM | POA: Diagnosis present

## 2017-02-27 DIAGNOSIS — Z66 Do not resuscitate: Secondary | ICD-10-CM | POA: Diagnosis present

## 2017-02-27 DIAGNOSIS — E119 Type 2 diabetes mellitus without complications: Secondary | ICD-10-CM

## 2017-02-27 DIAGNOSIS — E039 Hypothyroidism, unspecified: Secondary | ICD-10-CM | POA: Diagnosis present

## 2017-02-27 DIAGNOSIS — K31811 Angiodysplasia of stomach and duodenum with bleeding: Secondary | ICD-10-CM | POA: Diagnosis present

## 2017-02-27 DIAGNOSIS — N179 Acute kidney failure, unspecified: Secondary | ICD-10-CM

## 2017-02-27 DIAGNOSIS — Z7984 Long term (current) use of oral hypoglycemic drugs: Secondary | ICD-10-CM

## 2017-02-27 DIAGNOSIS — Z79899 Other long term (current) drug therapy: Secondary | ICD-10-CM | POA: Diagnosis not present

## 2017-02-27 DIAGNOSIS — Z8711 Personal history of peptic ulcer disease: Secondary | ICD-10-CM | POA: Diagnosis not present

## 2017-02-27 DIAGNOSIS — D62 Acute posthemorrhagic anemia: Secondary | ICD-10-CM

## 2017-02-27 DIAGNOSIS — H409 Unspecified glaucoma: Secondary | ICD-10-CM | POA: Diagnosis present

## 2017-02-27 DIAGNOSIS — D649 Anemia, unspecified: Secondary | ICD-10-CM

## 2017-02-27 DIAGNOSIS — K922 Gastrointestinal hemorrhage, unspecified: Secondary | ICD-10-CM | POA: Diagnosis not present

## 2017-02-27 DIAGNOSIS — K2970 Gastritis, unspecified, without bleeding: Secondary | ICD-10-CM

## 2017-02-27 DIAGNOSIS — Z888 Allergy status to other drugs, medicaments and biological substances status: Secondary | ICD-10-CM | POA: Diagnosis not present

## 2017-02-27 DIAGNOSIS — Z7982 Long term (current) use of aspirin: Secondary | ICD-10-CM | POA: Diagnosis not present

## 2017-02-27 DIAGNOSIS — Z823 Family history of stroke: Secondary | ICD-10-CM

## 2017-02-27 LAB — COMPREHENSIVE METABOLIC PANEL
ALT: 16 U/L (ref 14–54)
ANION GAP: 9 (ref 5–15)
AST: 19 U/L (ref 15–41)
Albumin: 3.7 g/dL (ref 3.5–5.0)
Alkaline Phosphatase: 62 U/L (ref 38–126)
BUN: 28 mg/dL — ABNORMAL HIGH (ref 6–20)
CHLORIDE: 111 mmol/L (ref 101–111)
CO2: 23 mmol/L (ref 22–32)
Calcium: 9 mg/dL (ref 8.9–10.3)
Creatinine, Ser: 2.06 mg/dL — ABNORMAL HIGH (ref 0.44–1.00)
GFR calc non Af Amer: 21 mL/min — ABNORMAL LOW (ref >60)
GFR, EST AFRICAN AMERICAN: 24 mL/min — AB (ref >60)
Glucose, Bld: 146 mg/dL — ABNORMAL HIGH (ref 65–99)
POTASSIUM: 4.2 mmol/L (ref 3.5–5.1)
SODIUM: 143 mmol/L (ref 135–145)
Total Bilirubin: 0.7 mg/dL (ref 0.3–1.2)
Total Protein: 7.2 g/dL (ref 6.5–8.1)

## 2017-02-27 LAB — CBC
HCT: 24.5 % — ABNORMAL LOW (ref 35.0–47.0)
HEMOGLOBIN: 8.2 g/dL — AB (ref 12.0–16.0)
MCH: 25.9 pg — ABNORMAL LOW (ref 26.0–34.0)
MCHC: 33.3 g/dL (ref 32.0–36.0)
MCV: 77.9 fL — AB (ref 80.0–100.0)
Platelets: 416 10*3/uL (ref 150–440)
RBC: 3.15 MIL/uL — ABNORMAL LOW (ref 3.80–5.20)
RDW: 14.7 % — ABNORMAL HIGH (ref 11.5–14.5)
WBC: 5.7 10*3/uL (ref 3.6–11.0)

## 2017-02-27 LAB — GLUCOSE, CAPILLARY
Glucose-Capillary: 83 mg/dL (ref 65–99)
Glucose-Capillary: 204 mg/dL — ABNORMAL HIGH (ref 65–99)

## 2017-02-27 LAB — HEMOGLOBIN
HEMOGLOBIN: 6.9 g/dL — AB (ref 12.0–16.0)
Hemoglobin: 7.8 g/dL — ABNORMAL LOW (ref 12.0–16.0)

## 2017-02-27 MED ORDER — SODIUM CHLORIDE 0.9 % IV BOLUS (SEPSIS)
1000.0000 mL | Freq: Once | INTRAVENOUS | Status: AC
  Administered 2017-02-27: 1000 mL via INTRAVENOUS

## 2017-02-27 MED ORDER — POLYSACCHARIDE IRON COMPLEX 150 MG PO CAPS
150.0000 mg | ORAL_CAPSULE | Freq: Every day | ORAL | Status: DC
  Administered 2017-02-27 – 2017-03-01 (×2): 150 mg via ORAL
  Filled 2017-02-27 (×3): qty 1

## 2017-02-27 MED ORDER — LATANOPROST 0.005 % OP SOLN
1.0000 [drp] | Freq: Every day | OPHTHALMIC | Status: DC
  Administered 2017-02-27 – 2017-02-28 (×2): 1 [drp] via OPHTHALMIC
  Filled 2017-02-27: qty 2.5

## 2017-02-27 MED ORDER — INSULIN ASPART 100 UNIT/ML ~~LOC~~ SOLN
0.0000 [IU] | Freq: Three times a day (TID) | SUBCUTANEOUS | Status: DC
  Administered 2017-03-01: 1 [IU] via SUBCUTANEOUS
  Filled 2017-02-27: qty 1

## 2017-02-27 MED ORDER — LISINOPRIL 20 MG PO TABS
40.0000 mg | ORAL_TABLET | Freq: Every day | ORAL | Status: DC
  Administered 2017-02-27 – 2017-03-01 (×2): 40 mg via ORAL
  Filled 2017-02-27 (×2): qty 2

## 2017-02-27 MED ORDER — SODIUM CHLORIDE 0.9 % IV SOLN
INTRAVENOUS | Status: DC
  Administered 2017-02-27 – 2017-02-28 (×3): via INTRAVENOUS

## 2017-02-27 MED ORDER — LEVOTHYROXINE SODIUM 112 MCG PO TABS
112.0000 ug | ORAL_TABLET | Freq: Every day | ORAL | Status: DC
  Administered 2017-03-01: 112 ug via ORAL
  Filled 2017-02-27 (×3): qty 1

## 2017-02-27 MED ORDER — AMLODIPINE BESYLATE 5 MG PO TABS
5.0000 mg | ORAL_TABLET | Freq: Every day | ORAL | Status: DC
  Administered 2017-02-27 – 2017-03-01 (×2): 5 mg via ORAL
  Filled 2017-02-27 (×2): qty 1

## 2017-02-27 MED ORDER — ATORVASTATIN CALCIUM 10 MG PO TABS
10.0000 mg | ORAL_TABLET | Freq: Every day | ORAL | Status: DC
  Administered 2017-02-27 – 2017-03-01 (×2): 10 mg via ORAL
  Filled 2017-02-27 (×2): qty 1

## 2017-02-27 MED ORDER — SUCRALFATE 1 G PO TABS
1.0000 g | ORAL_TABLET | Freq: Three times a day (TID) | ORAL | Status: DC
  Administered 2017-02-27 – 2017-03-01 (×4): 1 g via ORAL
  Filled 2017-02-27 (×4): qty 1

## 2017-02-27 MED ORDER — SODIUM CHLORIDE 0.9 % IV SOLN
8.0000 mg/h | INTRAVENOUS | Status: DC
  Administered 2017-02-27 – 2017-03-01 (×3): 8 mg/h via INTRAVENOUS
  Filled 2017-02-27 (×4): qty 80

## 2017-02-27 MED ORDER — ACETAMINOPHEN 325 MG PO TABS
650.0000 mg | ORAL_TABLET | Freq: Four times a day (QID) | ORAL | Status: DC | PRN
  Administered 2017-03-01: 650 mg via ORAL
  Filled 2017-02-27: qty 2

## 2017-02-27 MED ORDER — PANTOPRAZOLE SODIUM 40 MG IV SOLR: 40.0000 mg | Freq: Two times a day (BID) | INTRAVENOUS | Status: DC

## 2017-02-27 MED ORDER — GLIPIZIDE ER 10 MG PO TB24
10.0000 mg | ORAL_TABLET | Freq: Every day | ORAL | Status: DC
  Administered 2017-02-27: 10 mg via ORAL
  Filled 2017-02-27 (×2): qty 1

## 2017-02-27 MED ORDER — DOCUSATE SODIUM 100 MG PO CAPS
100.0000 mg | ORAL_CAPSULE | Freq: Every day | ORAL | Status: DC | PRN
  Administered 2017-02-27: 100 mg via ORAL
  Filled 2017-02-27: qty 1

## 2017-02-27 MED ORDER — HYDROXYZINE HCL 25 MG PO TABS
25.0000 mg | ORAL_TABLET | Freq: Every evening | ORAL | Status: DC
  Filled 2017-02-27: qty 1

## 2017-02-27 MED ORDER — SODIUM CHLORIDE 0.9 % IV SOLN
80.0000 mg | Freq: Once | INTRAVENOUS | Status: AC
  Administered 2017-02-27: 80 mg via INTRAVENOUS
  Filled 2017-02-27: qty 80

## 2017-02-27 MED ORDER — VITAMIN B-12 1000 MCG PO TABS
1000.0000 ug | ORAL_TABLET | Freq: Every day | ORAL | Status: DC
  Administered 2017-03-01: 1000 ug via ORAL
  Filled 2017-02-27: qty 1

## 2017-02-27 MED ORDER — ACETAMINOPHEN 650 MG RE SUPP: 650.0000 mg | Freq: Four times a day (QID) | RECTAL | Status: DC | PRN

## 2017-02-27 NOTE — ED Triage Notes (Addendum)
FIRST NURSE NOTE-sent for blood transfusion r/t HGB 7 per daughter. Ambulatory to triage, NAD. Has had dark stool

## 2017-02-27 NOTE — Consult Note (Signed)
Patricia Lame, MD Patricia Dudley  [address], [place], [place] [address] Phone: [phone] Fax : [phone]  Consultation  Referring Provider:     Dr. Posey Pronto Primary Care Physician:  Tracie Harrier, MD Primary Gastroenterologist:  Dr. [name]         Reason for Consultation:     Upper GI bleed  Date of Admission:  02/27/2017 Date of Consultation:  02/27/2017         HPI:   Patricia Dudley is a 83 y.o. female Who has a history of an upper GI bleed in the past with ulcerations or erosions found by Dr. [name].  The patient had went to follow-up at Dr. Percell [place] office and was seen by a nurse practitioner who checked her blood count and it was found to be low at 7.7.  The patient was told to come to emergency room.  The patient has had dark stools but states she has been on iron.  The patient is on 325 mg of aspirin a day for a history of strokes but denies any Advil E or Motrin use.  She also denies taking any BCs her Goody powders.  The patient's baseline hemoglobin 2 months ago was 11.6 with her hemoglobin on admission of 8.2 which is down 3-1/2 hours later to 7.8.  The patient had been started on a diet and was reporting that she ate broth possibly 20 minutes before I saw her.  There is no report of any abdominal pain nausea vomiting fevers chills or dizziness.  The patient also denies any shortness of breath. I am now being asked to see this patient with black stools which she thinks is from her iron addition to a drop in her hemoglobin and a history of upper GI bleeding.  Past Medical History:  Diagnosis Date  . A-fib ([place])   . CVA (cerebral vascular accident) ([place])   . Diabetes mellitus without complication ([place])   . Hyperlipemia   . Hypertension   . Thyroid disease     Past Surgical History:  Procedure Laterality Date  . ESOPHAGOGASTRODUODENOSCOPY N/A 08/08/2016   Procedure: ESOPHAGOGASTRODUODENOSCOPY (EGD);  Surgeon: Patricia Silvas, MD;  Location[facility];   Service: Endoscopy;  Laterality: N/A;  . EYE SURGERY     left    Prior to Admission medications   Medication Sig Start Date End Date Taking? Authorizing Provider  amLODipine (NORVASC) 5 MG tablet Take 1 tablet (5 mg total) by mouth daily. 12/21/16  Yes Hillary Bow, MD  aspirin EC 325 MG tablet Take 1 tablet (325 mg total) by mouth daily. 12/19/16  Yes Sudini, Alveta Heimlich, MD  atorvastatin (LIPITOR) 10 MG tablet Take 1 tablet (10 mg total) by mouth daily. 12/19/16  Yes Sudini, Alveta Heimlich, MD  docusate sodium (COLACE) 100 MG capsule Take 100 mg by mouth daily as needed for mild constipation.   Yes [provider]  glipiZIDE (GLUCOTROL XL) 10 MG 24 hr tablet Take 10 mg by mouth daily.   Yes [provider]  hydrocortisone 2.5 % cream Apply 1 application topically daily.   Yes [provider]  hydrOXYzine (ATARAX/VISTARIL) 25 MG tablet Take 25 mg by mouth every evening.    Yes [provider]  latanoprost (XALATAN) 0.005 % ophthalmic solution Place 1 drop into both eyes at bedtime.   Yes [provider]  levothyroxine (SYNTHROID, LEVOTHROID) 112 MCG tablet Take 112 mcg by mouth daily.   Yes [provider]  lisinopril (PRINIVIL,ZESTRIL) 40 MG  tablet Take 1 tablet (40 mg total) by mouth daily. 07/27/15  Yes [name], MD  pantoprazole (PROTONIX) 40 MG tablet Take 1 tablet (40 mg total) by mouth 2 (two) times daily before a meal. 12/20/16  Yes Sudini, Srikar, MD  sucralfate (CARAFATE) 1 g tablet Take 1 g by mouth 3 (three) times daily.    Yes [provider]  vitamin B-12 (CYANOCOBALAMIN) 1000 MCG tablet Take 1,000 mcg by mouth daily.   Yes [provider]  iron polysaccharides (NIFEREX) 150 MG capsule Take 150 mg by mouth daily.    [provider]    Family History  Problem Relation Age of Onset  . Diabetes Mother   . CVA Mother   . Hypertension Mother   . Diabetes Sister   . Diabetes Brother      Social History    Substance Use Topics  . Smoking status: Never Smoker  . Smokeless tobacco: Never Used  . Alcohol use No    Allergies as of 02/27/2017 - Review Complete 02/27/2017  Allergen Reaction Noted  . Iodine Rash 07/26/2015    Review of Systems:    All systems reviewed and negative except where noted in HPI.   Physical Exam:  Vital signs in last 24 hours: Temp:  [98 F (36.7 C)-98.4 F (36.9 C)] 98.4 F (36.9 C) (07/05 1947) Pulse Rate:  [64-98] 86 (07/05 1947) Resp:  [12-20] 16 (07/05 1947) BP: (133-166)/(55-99) 143/70 (07/05 1947) SpO2:  [100 %] 100 % (07/05 1947) Weight:  [138 lb (62.6 kg)] 138 lb (62.6 kg) (07/05 1209) Last BM Date: 02/26/17 General:   Pleasant, cooperative in NAD Head:  Normocephalic and atraumatic. Eyes:   No icterus.   Conjunctiva pink. PERRLA. Ears:  Normal auditory acuity. Neck:  Supple; no masses or thyroidomegaly Lungs: Respirations even and unlabored. Lungs clear to auscultation bilaterally.   No wheezes, crackles, or rhonchi.  Heart:  Regular rate and rhythm;  Without murmur, clicks, rubs or gallops Abdomen:  Soft, nondistended, nontender. Normal bowel sounds. No appreciable masses or hepatomegaly.  No rebound or guarding.  Rectal:  Not performed. Msk:  Symmetrical without gross deformities.    Extremities:  Without edema, cyanosis or clubbing. Neurologic:  Alert and oriented x3;  grossly normal neurologically. Skin:  Intact without significant lesions or rashes. Cervical Nodes:  No significant cervical adenopathy. Psych:  Alert and cooperative. Normal affect.  LAB RESULTS:  Recent Labs  02/27/17 1205 02/27/17 1550  WBC 5.7  --   HGB 8.2* 7.8*  HCT 24.5*  --   PLT 416  --    BMET  Recent Labs  02/27/17 1205  NA 143  K 4.2  CL 111  CO2 23  GLUCOSE 146*  BUN 28*  CREATININE 2.06*  CALCIUM 9.0   LFT  Recent Labs  02/27/17 1205  PROT 7.2  ALBUMIN 3.7  AST 19  ALT 16  ALKPHOS 62  BILITOT 0.7   PT/INR No results for  input(s): LABPROT, INR in the last 72 hours.  STUDIES: No results found.    Impression / Plan:   Patricia Dudley is a 83 y.o. y/o female with a possible upper GI bleed with a hemoglobin drop.  The patient has a history of peptic ulcer disease.  The patient has had a drop in her hemoglobin since admission and over the last few months.  The patient will be set up for an EGD for tomorrow. The patient reports that she is presently on Carafate  and Nexium.  The patient will be continued on a PPI.  The patient has been explained the plan and agrees with it.  Thank you for involving me in the care of this patient.      LOS: 0 days   Patricia Lame, MD  02/27/2017, 8:06 PM   Note: This dictation was prepared with Dragon dictation along with smaller phrase technology. Any transcriptional errors that result from this process are unintentional.

## 2017-02-27 NOTE — ED Triage Notes (Signed)
PT to ED reporting having an abnormal Hgb level at PCP in 02/24/17. Pt sent to ED for blood transfusion per daughter and pt. Pt reports having felt weak recently but deneis lightheadedness, nausea, vomiting or pain. Pt is alert and oriented x 4. NAD.

## 2017-02-27 NOTE — H&P (Signed)
[facility] NAME: Patricia Dudley    MR#:  [id number]  DATE OF BIRTH:  1930/08/16  DATE OF ADMISSION:  02/27/2017  PRIMARY CARE PHYSICIAN: Tracie Harrier, MD   REQUESTING/REFERRING PHYSICIAN:  Lisa Roca MD  CHIEF COMPLAINT:   Chief Complaint  Patient presents with  . Abnormal Lab    HISTORY OF PRESENT ILLNESS: Patricia Dudley  is a 83 y.o. female with a known history of   Upper GI bleed in the past, atrial fibrillation, CVA, diabetes type 2, essential hypertension and hyperlipidemia and hypothyroidism who states that she went for regular follow-up with her GI physician at [facility].   Patient's hemoglobin was noted to be low there at 7.7. She was told to come to the emergency room. Patient does report having dark color stools but contributes this to taking iron therapy. Patient's hemoglobin is a tear. Previous hemoglobin ranges were in 9's. Patient states that she does not take any ibuprofen Motrin or any other NSAIDs except aspirin that she was started on for stroke. She otherwise denies any chest pain shortness of breath palpitations no nausea vomiting or diarrhea   PAST MEDICAL HISTORY:   Past Medical History:  Diagnosis Date  . A-fib ([place])   . CVA (cerebral vascular accident) ([place])   . Diabetes mellitus without complication ([place])   . Hyperlipemia   . Hypertension   . Thyroid disease     PAST SURGICAL HISTORY: Past Surgical History:  Procedure Laterality Date  . ESOPHAGOGASTRODUODENOSCOPY N/A 08/08/2016   Procedure: ESOPHAGOGASTRODUODENOSCOPY (EGD);  Surgeon: [name], MD;  Location: [facility] ENDOSCOPY;  Service: Endoscopy;  Laterality: N/A;  . EYE SURGERY     left    SOCIAL HISTORY:  Social History  Substance Use Topics  . Smoking status: Never Smoker  . Smokeless tobacco: Never Used  . Alcohol use No    FAMILY HISTORY:  Family History  Problem Relation Age of Onset  . Diabetes Mother   . CVA Mother   .  Hypertension Mother   . Diabetes Sister   . Diabetes Brother     DRUG ALLERGIES:  Allergies  Allergen Reactions  . Iodine Rash    REVIEW OF SYSTEMS:   CONSTITUTIONAL: No fever, fatigue or weakness.  EYES: No blurred or double vision.  EARS, NOSE, AND THROAT: No tinnitus or ear pain.  RESPIRATORY: No cough, shortness of breath, wheezing or hemoptysis.  CARDIOVASCULAR: No chest pain, orthopnea, edema.  GASTROINTESTINAL: No nausea, vomiting, diarrhea or abdominal pain. Dark color stools GENITOURINARY: No dysuria, hematuria.  ENDOCRINE: No polyuria, nocturia,  HEMATOLOGY: No anemia, easy bruising or bleeding SKIN: No rash or lesion. MUSCULOSKELETAL: No joint pain or arthritis.   NEUROLOGIC: No tingling, numbness, weakness.  PSYCHIATRY: No anxiety or depression.   MEDICATIONS AT HOME:  Prior to Admission medications   Medication Sig Start Date End Date Taking? Authorizing Provider  amLODipine (NORVASC) 5 MG tablet Take 1 tablet (5 mg total) by mouth daily. 12/21/16  Yes Hillary Bow, MD  aspirin EC 325 MG tablet Take 1 tablet (325 mg total) by mouth daily. 12/19/16  Yes Sudini, Alveta Heimlich, MD  atorvastatin (LIPITOR) 10 MG tablet Take 1 tablet (10 mg total) by mouth daily. 12/19/16  Yes Sudini, Alveta Heimlich, MD  docusate sodium (COLACE) 100 MG capsule Take 100 mg by mouth daily as needed for mild constipation.   Yes [provider]  glipiZIDE (GLUCOTROL XL) 10 MG 24 hr tablet Take 10 mg by  mouth daily.   Yes [provider]  hydrocortisone 2.5 % cream Apply 1 application topically daily.   Yes [provider]  hydrOXYzine (ATARAX/VISTARIL) 25 MG tablet Take 25 mg by mouth every evening.    Yes [provider]  latanoprost (XALATAN) 0.005 % ophthalmic solution Place 1 drop into both eyes at bedtime.   Yes [provider]  levothyroxine (SYNTHROID, LEVOTHROID) 112 MCG tablet Take 112 mcg by mouth daily.   Yes [provider]  lisinopril  (PRINIVIL,ZESTRIL) 40 MG tablet Take 1 tablet (40 mg total) by mouth daily. 07/27/15  Yes [name], MD  pantoprazole (PROTONIX) 40 MG tablet Take 1 tablet (40 mg total) by mouth 2 (two) times daily before a meal. 12/20/16  Yes Sudini, Srikar, MD  sucralfate (CARAFATE) 1 g tablet Take 1 g by mouth 3 (three) times daily.    Yes [provider]  vitamin B-12 (CYANOCOBALAMIN) 1000 MCG tablet Take 1,000 mcg by mouth daily.   Yes [provider]  iron polysaccharides (NIFEREX) 150 MG capsule Take 150 mg by mouth daily.    [provider]      PHYSICAL EXAMINATION:   VITAL SIGNS: Blood pressure (!) 166/55, pulse 85, temperature 98 F (36.7 C), temperature source Oral, resp. rate 14, height 5\' 6"  (1.676 m), weight 138 lb (62.6 kg), SpO2 100 %.  GENERAL:  83 y.o.-year-old patient lying in the bed with no acute distress.  EYES: Pupils equal, round, reactive to light and accommodation. No scleral icterus. Extraocular muscles intact.  HEENT: Head atraumatic, normocephalic. Oropharynx and nasopharynx clear.  NECK:  Supple, no jugular venous distention. No thyroid enlargement, no tenderness.  LUNGS: Normal breath sounds bilaterally, no wheezing, rales,rhonchi or crepitation. No use of accessory muscles of respiration.  CARDIOVASCULAR: Irregularly irregular. No murmurs, rubs, or gallops.  ABDOMEN: Soft, nontender, nondistended. Bowel sounds present. No organomegaly or mass.  EXTREMITIES: No pedal edema, cyanosis, or clubbing.  NEUROLOGIC: Cranial nerves II through XII are intact. Muscle strength 5/5 in all extremities. Sensation intact. Gait not checked.  PSYCHIATRIC: The patient is alert and oriented x 3.  SKIN: No obvious rash, lesion, or ulcer.   LABORATORY PANEL:   CBC  Recent Labs Lab 02/27/17 1205  WBC 5.7  HGB 8.2*  HCT 24.5*  PLT 416  MCV 77.9*  MCH 25.9*  MCHC 33.3  RDW 14.7*    ------------------------------------------------------------------------------------------------------------------  Chemistries   Recent Labs Lab 02/27/17 1205  NA 143  K 4.2  CL 111  CO2 23  GLUCOSE 146*  BUN 28*  CREATININE 2.06*  CALCIUM 9.0  AST 19  ALT 16  ALKPHOS 62  BILITOT 0.7   ------------------------------------------------------------------------------------------------------------------ estimated creatinine clearance is 19 mL/min (A) (by C-G formula based on SCr of 2.06 mg/dL (H)). ------------------------------------------------------------------------------------------------------------------ No results for input(s): TSH, T4TOTAL, T3FREE, THYROIDAB in the last 72 hours.  Invalid input(s): FREET3   Coagulation profile No results for input(s): INR, PROTIME in the last 168 hours. ------------------------------------------------------------------------------------------------------------------- No results for input(s): DDIMER in the last 72 hours. -------------------------------------------------------------------------------------------------------------------  Cardiac Enzymes No results for input(s): CKMB, TROPONINI, MYOGLOBIN in the last 168 hours.  Invalid input(s): CK ------------------------------------------------------------------------------------------------------------------ Invalid input(s): POCBNP  ---------------------------------------------------------------------------------------------------------------  Urinalysis    Component Value Date/Time   COLORURINE STRAW (A) 08/07/2016 1630   APPEARANCEUR CLEAR (A) 08/07/2016 1630   LABSPEC 1.008 08/07/2016 1630   PHURINE 6.0 08/07/2016 1630   GLUCOSEU NEGATIVE 08/07/2016 1630   HGBUR NEGATIVE 08/07/2016 1630   BILIRUBINUR NEGATIVE 08/07/2016 1630  KETONESUR NEGATIVE 08/07/2016 1630   PROTEINUR 30 (A) 08/07/2016 1630   NITRITE NEGATIVE 08/07/2016 1630   LEUKOCYTESUR TRACE (A) 08/07/2016  1630     RADIOLOGY: No results found.  EKG: Orders placed or performed during the hospital encounter of 12/19/16  . EKG 12-Lead  . EKG 12-Lead  . EKG 12-Lead  . EKG 12-Lead    IMPRESSION AND PLAN: Patient is a 83 year old with previous history of GI bleed sent for low hemoglobin  1. Upper GI bleed likely related to previous gastric ulcers Her hemoglobin is at 8 right now she is hemodynamically stable I will not transfuse I will check her iron levels and ferritin to see if she needs IV iron I have discussed with on-call gastroenterologist regarding her presentation he will see her Continue Protonix started in the emergency room as well as Carafate taking at home   2. Essential hypertension continue therapy with lisinopril  3. Diabetes type 2 continue glucose control placed on sliding scale insulin  4. Hyperlipidemia unspecified continue Lipitor  5. Glaucoma continue eyedrops  6. Hypothyroidism continue Synthroid  7. Misc: scd's   8. CODE STATUS previous code DO NOT RESUSCITATE confirmed with the patient she wishes to remain DO NOT RESUSCITATE  All the records are reviewed and case discussed with ED provider. Management plans discussed with the patient, family and they are in agreement.  CODE STATUS:    Code Status Orders        Start     Ordered   02/27/17 1436  Do not attempt resuscitation (DNR)  Continuous    Question Answer Comment  In the event of cardiac or respiratory ARREST Do not call a "code blue"   In the event of cardiac or respiratory ARREST Do not perform Intubation, CPR, defibrillation or ACLS   In the event of cardiac or respiratory ARREST Use medication by any route, position, wound care, and other measures to relive pain and suffering. May use oxygen, suction and manual treatment of airway obstruction as needed for comfort.      02/27/17 1435    Code Status History    Date Active Date Inactive Code Status Order ID Comments User Context    12/20/2016  2:07 AM 12/20/2016  6:59 PM DNR [id number]  Saundra Shelling, MD Inpatient   12/18/2016  3:31 PM 12/19/2016  9:43 PM DNR [id number]  Henreitta Leber, MD ED   08/07/2016  5:58 PM 08/09/2016  2:29 PM Full Code [id number]  Vaughan Basta, MD Inpatient   07/26/2015  1:55 PM 07/27/2015  6:32 PM Full Code [id number]  Theodoro Grist, MD Inpatient    Advance Directive Documentation     Most Recent Value  Type of Advance Directive  Living will, Healthcare Power of Attorney  Pre-existing out of facility DNR order (yellow form or pink MOST form)  -  "MOST" Form in Place?  -       TOTAL TIME TAKING CARE OF THIS PATIENT:55 minutes.    Dustin Flock M.D on 02/27/2017 at 2:38 PM  Between 7am to 6pm - Pager - [phone]  After 6pm go to [email or web address] - password [email or web address] [facility]  Office  [phone]  CC: Primary care physician; Tracie Harrier, MD

## 2017-02-27 NOTE — ED Notes (Signed)
Patient reports being diagnosed with GI bleed and was told by her doctor to come to the ED for a blood transfusion after having blood drawn twice this week and her hemoglobin was low.  Patient denies pain or dizziness.  Patient is alert and oriented x 4.  No obvious distress at this time.  Skin is warm and dry and respirations even and not labored.  Patient states she had a blood transfusion about 6 months ago.

## 2017-02-27 NOTE — ED Triage Notes (Signed)
Daughter reports dr Reita Cliche with [facility] wanted to be called when pt is here

## 2017-02-27 NOTE — ED Provider Notes (Signed)
[facility] Emergency Department Provider Note ____________________________________________   I have reviewed the triage vital signs and the triage nursing note.  HISTORY  Chief Complaint Abnormal Lab   Historian Patient  HPI Patricia Dudley is a 83 y.o. female with a history of atrial fibrillation, prior CVA, andhistory of occult blood in her stool and found to have nonbleeding gastric ulcers in 07/2016, presents today after being told to come to the ER for worsening anemia in the setting of dark stools. Patient was seen at GI office and was told that the hemoglobin had went from 9-7. Patient has been having dark stools, unclear for what period of time.   She is currently on aspirin because of the stroke in April, 325.  Denies chest pain or trouble breathing. Denies new weakness or numbness.  Denies abdominal pain. She has had a history of blood transfusions in the past.   Past Medical History:  Diagnosis Date  . A-fib ([place])   . CVA (cerebral vascular accident) ([place])   . Diabetes mellitus without complication ([place])   . Hyperlipemia   . Hypertension   . Thyroid disease     Patient Active Problem List   Diagnosis Date Noted  . CVA (cerebral vascular accident) ([place]) 12/20/2016  . Hypertensive urgency 12/19/2016  . GI bleed 08/07/2016  . 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 Renal insufficiency 07/26/2015  . TIA (transient ischemic attack) 07/26/2015  . Vertigo 07/26/2015    Past Surgical History:  Procedure Laterality Date  . ESOPHAGOGASTRODUODENOSCOPY N/A 08/08/2016   Procedure: ESOPHAGOGASTRODUODENOSCOPY (EGD);  Surgeon: [name], MD;  Location: [facility] ENDOSCOPY;  Service: Endoscopy;  Laterality: N/A;  . EYE SURGERY     left    Prior to Admission medications   Medication Sig Start Date End Date Taking? Authorizing Provider  amLODipine (NORVASC) 5 MG tablet Take 1 tablet (5 mg total) by mouth daily. 12/21/16  Yes Hillary Bow, MD  aspirin EC 325 MG tablet Take 1 tablet (325 mg total) by mouth daily. 12/19/16  Yes Sudini, Alveta Heimlich, MD  atorvastatin (LIPITOR) 10 MG tablet Take 1 tablet (10 mg total) by mouth daily. 12/19/16  Yes Sudini, Alveta Heimlich, MD  docusate sodium (COLACE) 100 MG capsule Take 100 mg by mouth daily as needed for mild constipation.   Yes [provider]  glipiZIDE (GLUCOTROL XL) 10 MG 24 hr tablet Take 10 mg by mouth daily.   Yes [provider]  hydrocortisone 2.5 % cream Apply 1 application topically daily.   Yes [provider]  hydrOXYzine (ATARAX/VISTARIL) 25 MG tablet Take 25 mg by mouth every evening.    Yes [provider]  latanoprost (XALATAN) 0.005 % ophthalmic solution Place 1 drop into both eyes at bedtime.   Yes [provider]  levothyroxine (SYNTHROID, LEVOTHROID) 112 MCG tablet Take 112 mcg by mouth daily.   Yes [provider]  lisinopril (PRINIVIL,ZESTRIL) 40 MG tablet Take 1 tablet (40 mg total) by mouth daily. 07/27/15  Yes [name], MD  pantoprazole (PROTONIX) 40 MG tablet Take 1 tablet (40 mg total) by mouth 2 (two) times daily before a meal. 12/20/16  Yes Sudini, Srikar, MD  sucralfate (CARAFATE) 1 g tablet Take 1 g by mouth 3 (three) times daily.    Yes [provider]  vitamin B-12 (CYANOCOBALAMIN) 1000 MCG tablet Take 1,000 mcg by mouth daily.   Yes [provider]  iron polysaccharides (NIFEREX) 150 MG capsule Take 150 mg by mouth daily.  [provider]    Allergies  Allergen Reactions  . Iodine Rash    Family History  Problem Relation Age of Onset  . Diabetes Mother   . CVA Mother   . Hypertension Mother   . Diabetes Sister   . Diabetes Brother     Social History Social History  Substance Use Topics  . Smoking status: Never Smoker  . Smokeless tobacco: Never Used  . Alcohol use No    Review of Systems  Constitutional: Negative for fever. Eyes: Negative for visual  changes. ENT: Negative for sore throat. Cardiovascular: Negative for chest pain. Respiratory: Negative for shortness of breath. Gastrointestinal: Negative for abdominal pain, vomiting and diarrhea.  Dark stool. No bloody stool. Genitourinary: Negative for dysuria. Musculoskeletal: Negative for back pain. Skin: Negative for rash. Neurological: Negative for headache.  ____________________________________________   PHYSICAL EXAM:  VITAL SIGNS: ED Triage Vitals  Enc Vitals Group     BP 02/27/17 1207 (!) 166/55     Pulse Rate 02/27/17 1207 85     Resp 02/27/17 1207 14     Temp 02/27/17 1207 98 F (36.7 C)     Temp Source 02/27/17 1207 Oral     SpO2 02/27/17 1207 100 %     Weight 02/27/17 1209 138 lb (62.6 kg)     Height 02/27/17 1209 5\' 6"  (1.676 m)     Head Circumference --      Peak Flow --      Pain Score --      Pain Loc --      Pain Edu? --      Excl. in [place]? --      Constitutional: Alert and oriented. Well appearing and in no distress. HEENT   Head: Normocephalic and atraumatic.      Eyes: Conjunctivae are normal. Pupils equal and round.       Ears:         Nose: No congestion/rhinnorhea.   Mouth/Throat: Mucous membranes are moist.   Neck: No stridor. Cardiovascular/Chest: Normal rate, regular rhythm.  No murmurs, rubs, or gallops. Respiratory: Normal respiratory effort without tachypnea nor retractions. Breath sounds are clear and equal bilaterally. No wheezes/rales/rhonchi. Gastrointestinal: Soft. No distention, no guarding, no rebound. Nontender.    Genitourinary/rectal:Nontender rectal exam. Black stool, strongly heme positive Musculoskeletal: Nontender with normal range of motion in all extremities. No joint effusions.  No lower extremity tenderness.  No edema. Neurologic:  Normal speech and language. No gross or focal neurologic deficits are appreciated. Skin:  Skin is warm, dry and intact. No rash noted. Psychiatric: Mood and affect are normal.  Speech and behavior are normal. Patient exhibits appropriate insight and judgment.   ____________________________________________  LABS (pertinent positives/negatives)  Labs Reviewed  COMPREHENSIVE METABOLIC PANEL - Abnormal; Notable for the following:       Result Value   Glucose, Bld 146 (*)    BUN 28 (*)    Creatinine, Ser 2.06 (*)    GFR calc non Af Amer 21 (*)    GFR calc Af Amer 24 (*)    All other components within normal limits  CBC - Abnormal; Notable for the following:    RBC 3.15 (*)    Hemoglobin 8.2 (*)    HCT 24.5 (*)    MCV 77.9 (*)    MCH 25.9 (*)    RDW 14.7 (*)    All other components within normal limits  TYPE AND SCREEN    ____________________________________________    EKG  I, Lisa Roca, MD, the attending physician have personally viewed and interpreted all ECGs.  None ____________________________________________  RADIOLOGY All Xrays were viewed by me. Imaging interpreted by Radiologist.  None __________________________________________  PROCEDURES  Procedure(s) performed: None  Critical Care performed: None  ____________________________________________   ED COURSE / ASSESSMENT AND PLAN  Pertinent labs & imaging results that were available during my care of the patient were reviewed by me and considered in my medical decision making (see chart for details).    Ms. Pohlmann is here with her daughter for being reported that her hemoglobin had dropped to 7 and the need for potential effusion.  She has been having dark stools and her black stool is strongly heme positive here. Stable blood pressure. Nontender abdomen. History of stomach ulcers although nonbleeding when the endoscopy was performed in December. Since then she has had a stroke and is currently on aspirin.  Her hemoglobin today is 8, however she does appear likely hemoconcentrated with a bump in her BUN and creatinine. I started getting fluids. She has a type and  screen.  Patient will be admitted to the hospitalist with acute renal failure, black stools with worsening anemia.  She does. Currently stable. Hospitalist will be consulted for admission. No acute need for GI in the emergency department consultation at this point.    CONSULTATIONS:   Hospitalist for admission.   Patient / Family / Caregiver informed of clinical course, medical decision-making process, and agree with plan.  ___________________________________________   FINAL CLINICAL IMPRESSION(S) / ED DIAGNOSES   Final diagnoses:  Acute renal failure, unspecified acute renal failure type ([place])  Anemia, unspecified type  Gastrointestinal hemorrhage with melena              Note: This dictation was prepared with Dragon dictation. Any transcriptional errors that result from this process are unintentional    Lisa Roca, MD 02/27/17 1406

## 2017-02-28 ENCOUNTER — Inpatient Hospital Stay: Payer: Medicare Other | Admitting: Anesthesiology

## 2017-02-28 ENCOUNTER — Encounter
Admission: EM | Disposition: A | Discharge status: Home / Self Care | Payer: Self-pay | Source: Home / Self Care | Attending: Specialist

## 2017-02-28 ENCOUNTER — Encounter: Payer: Self-pay | Admitting: Anesthesiology

## 2017-02-28 DIAGNOSIS — D62 Acute posthemorrhagic anemia: Secondary | ICD-10-CM

## 2017-02-28 DIAGNOSIS — K31811 Angiodysplasia of stomach and duodenum with bleeding: Principal | ICD-10-CM

## 2017-02-28 DIAGNOSIS — K2970 Gastritis, unspecified, without bleeding: Secondary | ICD-10-CM

## 2017-02-28 HISTORY — PX: ESOPHAGOGASTRODUODENOSCOPY (EGD) WITH PROPOFOL: SHX5813

## 2017-02-28 LAB — HEMOGLOBIN AND HEMATOCRIT, BLOOD
HEMATOCRIT: 31.9 % — AB (ref 35.0–47.0)
HEMOGLOBIN: 10.6 g/dL — AB (ref 12.0–16.0)

## 2017-02-28 LAB — PREPARE RBC (CROSSMATCH)

## 2017-02-28 LAB — BASIC METABOLIC PANEL
ANION GAP: 6 (ref 5–15)
BUN: 18 mg/dL (ref 6–20)
CALCIUM: 8 mg/dL — AB (ref 8.9–10.3)
CO2: 23 mmol/L (ref 22–32)
CREATININE: 1.57 mg/dL — AB (ref 0.44–1.00)
Chloride: 116 mmol/L — ABNORMAL HIGH (ref 101–111)
GFR calc Af Amer: 34 mL/min — ABNORMAL LOW (ref >60)
GFR, EST NON AFRICAN AMERICAN: 29 mL/min — AB (ref >60)
GLUCOSE: 56 mg/dL — AB (ref 65–99)
Potassium: 3.6 mmol/L (ref 3.5–5.1)
Sodium: 145 mmol/L (ref 135–145)

## 2017-02-28 LAB — CBC
HEMATOCRIT: 20.7 % — AB (ref 35.0–47.0)
Hemoglobin: 6.8 g/dL — ABNORMAL LOW (ref 12.0–16.0)
MCH: 26 pg (ref 26.0–34.0)
MCHC: 33.1 g/dL (ref 32.0–36.0)
MCV: 78.5 fL — AB (ref 80.0–100.0)
PLATELETS: 343 10*3/uL (ref 150–440)
RBC: 2.63 MIL/uL — ABNORMAL LOW (ref 3.80–5.20)
RDW: 14.4 % (ref 11.5–14.5)
WBC: 5.4 10*3/uL (ref 3.6–11.0)

## 2017-02-28 LAB — GLUCOSE, CAPILLARY
GLUCOSE-CAPILLARY: 138 mg/dL — AB (ref 65–99)
GLUCOSE-CAPILLARY: 76 mg/dL (ref 65–99)
GLUCOSE-CAPILLARY: 79 mg/dL (ref 65–99)
GLUCOSE-CAPILLARY: 118 mg/dL — AB (ref 65–99)
Glucose-Capillary: 59 mg/dL — ABNORMAL LOW (ref 65–99)
Glucose-Capillary: 61 mg/dL — ABNORMAL LOW (ref 65–99)
Glucose-Capillary: 94 mg/dL (ref 65–99)

## 2017-02-28 SURGERY — ESOPHAGOGASTRODUODENOSCOPY (EGD) WITH PROPOFOL
Anesthesia: General

## 2017-02-28 MED ORDER — PROPOFOL 10 MG/ML IV BOLUS
INTRAVENOUS | Status: AC
  Filled 2017-02-28: qty 20

## 2017-02-28 MED ORDER — FENTANYL CITRATE (PF) 100 MCG/2ML IJ SOLN
INTRAMUSCULAR | Status: DC | PRN
  Administered 2017-02-28: 20 ug via INTRAVENOUS

## 2017-02-28 MED ORDER — PROPOFOL 10 MG/ML IV BOLUS
INTRAVENOUS | Status: DC | PRN
  Administered 2017-02-28: 50 mg via INTRAVENOUS

## 2017-02-28 MED ORDER — LIDOCAINE HCL (CARDIAC) 20 MG/ML IV SOLN
INTRAVENOUS | Status: DC | PRN
  Administered 2017-02-28: 60 mg via INTRAVENOUS

## 2017-02-28 MED ORDER — DEXTROSE 5 % AND 0.45 % NACL IV BOLUS
100.0000 mL | Freq: Once | INTRAVENOUS | Status: DC
  Administered 2017-02-28: 100 mL via INTRAVENOUS

## 2017-02-28 MED ORDER — SODIUM CHLORIDE 0.9 % IV SOLN
Freq: Once | INTRAVENOUS | Status: AC
  Administered 2017-02-28: 09:00:00 via INTRAVENOUS

## 2017-02-28 MED ORDER — GLYCOPYRROLATE 0.2 MG/ML IJ SOLN
INTRAMUSCULAR | Status: DC | PRN
  Administered 2017-02-28: 0.2 mg via INTRAVENOUS

## 2017-02-28 MED ORDER — DEXTROSE-NACL 5-0.2 % IV SOLN
INTRAVENOUS | Status: DC | PRN
  Administered 2017-02-28: 13:00:00 via INTRAVENOUS

## 2017-02-28 MED ORDER — DEXTROSE 50 % IV SOLN
25.0000 mL | Freq: Once | INTRAVENOUS | Status: AC
  Administered 2017-02-28: 25 mL via INTRAVENOUS
  Filled 2017-02-28 (×2): qty 50

## 2017-02-28 NOTE — Progress Notes (Signed)
Latest Hgb noted to be 6.9. Pt without any obvious signs of bleeding, denies distress. Dr. Estanislado Pandy made aware and advised RN to notify provider of AM Hbg. Will monitor.

## 2017-02-28 NOTE — Anesthesia Post-op Follow-up Note (Cosign Needed)
Anesthesia QCDR form completed.        

## 2017-02-28 NOTE — Op Note (Signed)
[facility] Patient Name: Patricia Dudley Procedure Date: 02/28/2017 12:44 PM MRN: [id number] Account #: [id number] Date of Birth: 1930/04/20 Admit Type: Inpatient Age: 83 Room: [facility] ENDO ROOM 4 Gender: Female Note Status: Finalized Procedure:            Upper GI endoscopy Indications:          Acute post hemorrhagic anemia Providers:            [name] MD, MD Referring MD:         Tracie Harrier, MD (Referring MD) Medicines:            Propofol per Anesthesia Complications:        No immediate complications. Procedure:            Pre-Anesthesia Assessment:                       - Prior to the procedure, a History and Physical was                        performed, and patient medications and allergies were                        reviewed. The patient's tolerance of previous                        anesthesia was also reviewed. The risks and benefits of                        the procedure and the sedation options and risks were                        discussed with the patient. All questions were                        answered, and informed consent was obtained. Prior                        Anticoagulants: The patient has taken no previous                        anticoagulant or antiplatelet agents. ASA Grade                        Assessment: II - A patient with mild systemic disease.                        After reviewing the risks and benefits, the patient was                        deemed in satisfactory condition to undergo the                        procedure.                       After obtaining informed consent, the endoscope was                        passed under direct vision. Throughout the procedure,  the patient's blood pressure, pulse, and oxygen                        saturations were monitored continuously. The Endoscope                        was introduced through the mouth, and advanced to the                     third part of duodenum. The upper GI endoscopy was                        accomplished without difficulty. The patient tolerated                        the procedure well. Findings:      The examined esophagus was normal.      Localized mild inflammation was found in the gastric antrum.      A few angiodysplastic lesions with stigmata of recent bleeding were       found in the first portion of the duodenum and in the second portion of       the duodenum. Coagulation for hemostasis using argon beam at 2       liters/minute and 30 watts was successful. Impression:           - Normal esophagus.                       - Gastritis.                       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 Treated with argon beam coagulation.                       - No specimens collected. Recommendation:       - Discharge patient to home.                       - Return patient to hospital ward for ongoing care.                       - Clear liquid diet.                       - Continue present medications.                       - If Hb continues to drop then would recommend the                        pratient be set up for a colonoscopy. Procedure Code(s):    --- Professional ---                       [phone], Esophagogastroduodenoscopy, flexible, transoral;                        with control of bleeding, any method Diagnosis Code(s):    --- Professional ---  D62, Acute posthemorrhagic anemia                       K29.70, Gastritis, unspecified, without bleeding                       K31.811, Angiodysplasia of stomach and duodenum with                        bleeding CPT copyright 2016 [employer] Medical Association. All rights reserved. The codes documented in this report are preliminary and upon coder review may  be revised to meet current compliance requirements. [name] MD, MD 02/28/2017 1:27:47 PM This report  has been signed electronically. Number of Addenda: 0 Note Initiated On: 02/28/2017 12:44 PM      [facility]

## 2017-02-28 NOTE — Anesthesia Preprocedure Evaluation (Addendum)
Anesthesia Evaluation  Patient identified by MRN, date of birth, ID band Patient awake    Reviewed: Allergy & Precautions, NPO status , Patient's Chart, lab work & pertinent test results, reviewed documented beta blocker date and time   Airway Mallampati: II  TM Distance: >3 FB     Dental  (+) Chipped, Partial Lower, Partial Upper   Pulmonary           Cardiovascular hypertension, Pt. on medications + dysrhythmias Atrial Fibrillation      Neuro/Psych TIACVA, Residual Symptoms    GI/Hepatic   Endo/Other  diabetes, Type 2  Renal/GU Renal InsufficiencyRenal disease     Musculoskeletal   Abdominal   Peds  Hematology   Anesthesia Other Findings Hb was 6.8. Receiving blood. Continues to have some weakness on R side. However, can walk now without cane or walker.  Reproductive/Obstetrics                            Anesthesia Physical Anesthesia Plan  ASA: III  Anesthesia Plan: General   Post-op Pain Management:    Induction: Intravenous  PONV Risk Score and Plan:   Airway Management Planned:   Additional Equipment:   Intra-op Plan:   Post-operative Plan:   Informed Consent: I have reviewed the patients History and Physical, chart, labs and discussed the procedure including the risks, benefits and alternatives for the proposed anesthesia with the patient or authorized representative who has indicated his/her understanding and acceptance.     Plan Discussed with: CRNA  Anesthesia Plan Comments:        Anesthesia Quick Evaluation

## 2017-02-28 NOTE — Transfer of Care (Signed)
Immediate Anesthesia Transfer of Care Note  Patient: Patricia Dudley  Procedure(s) Performed: Procedure(s): ESOPHAGOGASTRODUODENOSCOPY (EGD) WITH PROPOFOL (N/A)  Patient Location: PACU  Anesthesia Type:General  Level of Consciousness: sedated  Airway & Oxygen Therapy: Patient Spontanous Breathing and Patient connected to nasal cannula oxygen  Post-op Assessment: Report given to RN and Post -op Vital signs reviewed and stable  Post vital signs: Reviewed and stable  Last Vitals:  Vitals:   02/28/17 1005 02/28/17 1216  BP: 137/79 (!) 160/81  Pulse: 93 100  Resp: 18 18  Temp: 36.6 C (!) 35.9 C    Last Pain:  Vitals:   02/28/17 1216  TempSrc: Tympanic         Complications: No apparent anesthesia complications

## 2017-02-28 NOTE — Anesthesia Postprocedure Evaluation (Signed)
Anesthesia Post Note  Patient: LYVONNE CASSELL  Procedure(s) Performed: Procedure(s) (LRB): ESOPHAGOGASTRODUODENOSCOPY (EGD) WITH PROPOFOL (N/A)  Patient location during evaluation: Endoscopy Anesthesia Type: General Level of consciousness: awake and alert Pain management: pain level controlled Vital Signs Assessment: post-procedure vital signs reviewed and stable Respiratory status: spontaneous breathing, nonlabored ventilation, respiratory function stable and patient connected to nasal cannula oxygen Cardiovascular status: blood pressure returned to baseline and stable Postop Assessment: no signs of nausea or vomiting Anesthetic complications: no     Last Vitals:  Vitals:   02/28/17 1355 02/28/17 1405  BP: (!) 179/72 (!) 187/71  Pulse: (!) 42 (!) 40  Resp: 16 19  Temp:      Last Pain:  Vitals:   02/28/17 1335  TempSrc: Tympanic                 Aikam Hellickson S

## 2017-02-28 NOTE — Progress Notes (Signed)
Inpatient Diabetes Program Recommendations  AACE/ADA: New Consensus Statement on Inpatient Glycemic Control (2015)  Target Ranges:  Prepandial:   less than 140 mg/dL      Peak postprandial:   less than 180 mg/dL (1-2 hours)      Critically ill patients:  140 - 180 mg/dL   Results for KAPRI, NERO (MRN [id number]) as of 02/28/2017 09:16  Ref. Range 02/28/2017 07:37  Glucose-Capillary Latest Ref Range: 65 - 99 mg/dL 59 (L)    Admit with: Low Hemoglobin/ Upper GIB  History: DM, CVA  Home DM Meds: Glipizide 10 mg daily  Current Insulin Orders: Glipizide 10 mg daily      Novolog Sensitive Correction Scale/ SSI (0-9 units) TID AC      MD- Note patient received 10 mg Glipizide yesterday at 4pm.  Hypoglycemic this AM.  Please consider discontinuing Glipizide for now.  Can resume at time of discharge.     --Will follow patient during hospitalization--  Wyn Quaker RN, MSN, CDE Diabetes Coordinator Inpatient Glycemic Control Team Team Pager: [phone] (8a-5p)

## 2017-02-28 NOTE — Progress Notes (Signed)
[place] at [place] NAME: Patricia Dudley    MR#:  [id number]  DATE OF BIRTH:  [birth year]  SUBJECTIVE:   Pt. Here due to Profound anemia and suspected to have an upper GI bleed. Hemoglobin was down to below 7 again this morning and was getting transfuse 1 more unit. Status post endoscopy today showing some bleeding angioectasia is in the duodenum which were treated.  REVIEW OF SYSTEMS:    Review of Systems  Constitutional: Negative for chills and fever.  HENT: Negative for congestion and tinnitus.   Eyes: Negative for blurred vision and double vision.  Respiratory: Negative for cough, shortness of breath and wheezing.   Cardiovascular: Negative for chest pain, orthopnea and PND.  Gastrointestinal: Positive for blood in stool and melena. Negative for abdominal pain, diarrhea, nausea and vomiting.  Genitourinary: Negative for dysuria and hematuria.  Neurological: Negative for dizziness, sensory change and focal weakness.  All other systems reviewed and are negative.   Nutrition: Clear liquids Tolerating Diet: yes Tolerating PT: Await Eval.   DRUG ALLERGIES:   Allergies  Allergen Reactions  . Iodine Rash    VITALS:  Blood pressure (!) 187/71, pulse (!) 40, temperature 98.2 F (36.8 C), temperature source Tympanic, resp. rate 19, height 5\' 6"  (1.676 m), weight 62.6 kg (138 lb), SpO2 100 %.  PHYSICAL EXAMINATION:   Physical Exam  GENERAL:  83 y.o.-year-old patient lying in bed in no acute distress.  EYES: Pupils equal, round, reactive to light and accommodation. No scleral icterus. Extraocular muscles intact.  HEENT: Head atraumatic, normocephalic. Oropharynx and nasopharynx clear.  NECK:  Supple, no jugular venous distention. No thyroid enlargement, no tenderness.  LUNGS: Normal breath sounds bilaterally, no wheezing, rales, rhonchi. No use of accessory muscles of respiration.  CARDIOVASCULAR: S1, S2 normal. No murmurs, rubs, or  gallops.  ABDOMEN: Soft, nontender, nondistended. Bowel sounds present. No organomegaly or mass.  EXTREMITIES: No cyanosis, clubbing or edema b/l.    NEUROLOGIC: Cranial nerves II through XII are intact. No focal Motor or sensory deficits b/l.  Globally weak.  PSYCHIATRIC: The patient is alert and oriented x 3.  SKIN: No obvious rash, lesion, or ulcer.    LABORATORY PANEL:   CBC  Recent Labs Lab 02/28/17 0656  WBC 5.4  HGB 6.8*  HCT 20.7*  PLT 343   ------------------------------------------------------------------------------------------------------------------  Chemistries   Recent Labs Lab 02/27/17 1205 02/28/17 0656  NA 143 145  K 4.2 3.6  CL 111 116*  CO2 23 23  GLUCOSE 146* 56*  BUN 28* 18  CREATININE 2.06* 1.57*  CALCIUM 9.0 8.0*  AST 19  --   ALT 16  --   ALKPHOS 62  --   BILITOT 0.7  --    ------------------------------------------------------------------------------------------------------------------  Cardiac Enzymes No results for input(s): TROPONINI in the last 168 hours. ------------------------------------------------------------------------------------------------------------------  RADIOLOGY:  No results found.   ASSESSMENT AND PLAN:   83 year old female with past medical history of attention, hyperlipidemia, diabetes, history of previous CVA, chronic atrial fibrillation, hypothyroidism presented to the hospital due to weakness and noted to have significant anemia.  1. GI bleed-patient suspected to have an upper GI bleed given melanotic stools and anemia. -Hemoglobin was down to below 7 today and transfuse 1 more unit this morning. Will follow serial hemoglobin. Status post endoscopy this morning showing normal esophagus. Bleeding angiectasia is in the duodenum. -Continue clear liquid diet, PPI and Carafate.  2 acute blood loss anemia is-secondary to GI bleed. -  Hemoglobin was down to 6.9 this morning transfuse 1 more unit and will follow  serial hemoglobin.  3. Hyperlipidemia-continue atorvastatin.  4. Hypothyroidism-continue Synthroid.  5. Diabetes type 2 without compensation-continue sliding scale insulin.  6. Glaucoma-continue latanoprost eyedrops.  7. Essential hypertension-continue lisinopril.   All the records are reviewed and case discussed with Care Management/Social Worker. Management plans discussed with the patient, family and they are in agreement.  CODE STATUS: Full code  DVT Prophylaxis: Ted's & SCD's.   TOTAL TIME TAKING CARE OF THIS PATIENT: 30 minutes.   POSSIBLE D/C IN 1-2 DAYS, DEPENDING ON CLINICAL CONDITION.   Henreitta Leber M.D on 02/28/2017 at 2:17 PM  Between 7am to 6pm - Pager - [phone]  After 6pm go to [email or web address] - Proofreader  [facility]  Office  [phone]  CC: Primary care physician; Tracie Harrier, MD

## 2017-03-01 LAB — TYPE AND SCREEN
ABO/RH(D): B POS
Antibody Screen: NEGATIVE
Unit division: 0

## 2017-03-01 LAB — CBC
HCT: 27.5 % — ABNORMAL LOW (ref 35.0–47.0)
Hemoglobin: 9.5 g/dL — ABNORMAL LOW (ref 12.0–16.0)
MCH: 27.4 pg (ref 26.0–34.0)
MCHC: 34.4 g/dL (ref 32.0–36.0)
MCV: 79.5 fL — ABNORMAL LOW (ref 80.0–100.0)
PLATELETS: 319 10*3/uL (ref 150–440)
RBC: 3.46 MIL/uL — AB (ref 3.80–5.20)
RDW: 14.8 % — ABNORMAL HIGH (ref 11.5–14.5)
WBC: 5.9 10*3/uL (ref 3.6–11.0)

## 2017-03-01 LAB — GLUCOSE, CAPILLARY
GLUCOSE-CAPILLARY: 130 mg/dL — AB (ref 65–99)
Glucose-Capillary: 70 mg/dL (ref 65–99)

## 2017-03-01 LAB — BPAM RBC
BLOOD PRODUCT EXPIRATION DATE: 201807182359
ISSUE DATE / TIME: 201807060935
UNIT TYPE AND RH: 7300

## 2017-03-01 MED ORDER — ASPIRIN 81 MG PO TBEC: 81.0000 mg | DELAYED_RELEASE_TABLET | Freq: Every day | ORAL | Status: DC

## 2017-03-01 MED ORDER — PANTOPRAZOLE SODIUM 40 MG PO TBEC
40.0000 mg | DELAYED_RELEASE_TABLET | Freq: Two times a day (BID) | ORAL | Status: DC
  Administered 2017-03-01: 40 mg via ORAL
  Filled 2017-03-01: qty 1

## 2017-03-01 NOTE — Progress Notes (Signed)
[place] at [place] NAME: Patricia Dudley    MR#:  [id number]  DATE OF BIRTH:  [birth year]  SUBJECTIVE:   Pt. Here due to Profound anemia and suspected to have an upper GI bleed. Status post endoscopy yesterday showing some bleeding angioectasia  in the duodenum which were treated.  NO acute bleeding overnight. NO abdominal pain, N/V, CP, Shortness of breath.   REVIEW OF SYSTEMS:    Review of Systems  Constitutional: Negative for chills and fever.  HENT: Negative for congestion and tinnitus.   Eyes: Negative for blurred vision and double vision.  Respiratory: Negative for cough, shortness of breath and wheezing.   Cardiovascular: Negative for chest pain, orthopnea and PND.  Gastrointestinal: Negative for abdominal pain, blood in stool, diarrhea, melena, nausea and vomiting.  Genitourinary: Negative for dysuria and hematuria.  Neurological: Negative for dizziness, sensory change and focal weakness.  All other systems reviewed and are negative.   Nutrition: Clear liquids Tolerating Diet: yes Tolerating PT: Await Eval.   DRUG ALLERGIES:   Allergies  Allergen Reactions  . Iodine Rash    VITALS:  Blood pressure (!) 151/81, pulse (!) 137, temperature (!) 97.4 F (36.3 C), temperature source Oral, resp. rate 18, height 5\' 6"  (1.676 m), weight 62.6 kg (138 lb), SpO2 99 %.  PHYSICAL EXAMINATION:   Physical Exam  GENERAL:  83 y.o.-year-old patient lying in bed in no acute distress.  EYES: Pupils equal, round, reactive to light and accommodation. No scleral icterus. Extraocular muscles intact.  HEENT: Head atraumatic, normocephalic. Oropharynx and nasopharynx clear.  NECK:  Supple, no jugular venous distention. No thyroid enlargement, no tenderness.  LUNGS: Normal breath sounds bilaterally, no wheezing, rales, rhonchi. No use of accessory muscles of respiration.  CARDIOVASCULAR: S1, S2 normal. No murmurs, rubs, or gallops.  ABDOMEN:  Soft, nontender, nondistended. Bowel sounds present. No organomegaly or mass.  EXTREMITIES: No cyanosis, clubbing or edema b/l.    NEUROLOGIC: Cranial nerves II through XII are intact. No focal Motor or sensory deficits b/l.   PSYCHIATRIC: The patient is alert and oriented x 3.  SKIN: No obvious rash, lesion, or ulcer.    LABORATORY PANEL:   CBC  Recent Labs Lab 03/01/17 0503  WBC 5.9  HGB 9.5*  HCT 27.5*  PLT 319   ------------------------------------------------------------------------------------------------------------------  Chemistries   Recent Labs Lab 02/27/17 1205 02/28/17 0656  NA 143 145  K 4.2 3.6  CL 111 116*  CO2 23 23  GLUCOSE 146* 56*  BUN 28* 18  CREATININE 2.06* 1.57*  CALCIUM 9.0 8.0*  AST 19  --   ALT 16  --   ALKPHOS 62  --   BILITOT 0.7  --    ------------------------------------------------------------------------------------------------------------------  Cardiac Enzymes No results for input(s): TROPONINI in the last 168 hours. ------------------------------------------------------------------------------------------------------------------  RADIOLOGY:  No results found.   ASSESSMENT AND PLAN:   83 year old female with past medical history of attention, hyperlipidemia, diabetes, history of previous CVA, chronic atrial fibrillation, hypothyroidism presented to the hospital due to weakness and noted to have significant anemia.  1. GI bleed-patient suspected to have an upper GI bleed given melanotic stools and anemia. -Hemoglobin was down to below 7 yesterday and transfused 1 unit yesterday and Hg. Stable.  No further bleeding or melena overnight.  -Status post endoscop showing normal esophagus but Bleeding angiectasia is in the duodenum which were treated. - tolerating clear liquid diet and will advance diet and monitor.  -DC Protonix drip, continue  PPI twice a day and monitor.  2 acute blood loss anemia is-secondary to GI bleed. -  Hemoglobin improved posttransfusion currently stable.  3. Hyperlipidemia-continue atorvastatin.  4. Hypothyroidism-continue Synthroid.  5. Diabetes type 2 without compensation-continue sliding scale insulin. - BS stable  6. Glaucoma-continue latanoprost eyedrops.  7. Essential hypertension-continue lisinopril.  Possible d/c later today or tomorrow if doing well.   All the records are reviewed and case discussed with Care Management/Social Worker. Management plans discussed with the patient, family and they are in agreement.  CODE STATUS: Full code  DVT Prophylaxis: Ted's & SCD's.   TOTAL TIME TAKING CARE OF THIS PATIENT: 30 minutes.   POSSIBLE D/C IN 1-2 DAYS, DEPENDING ON CLINICAL CONDITION.   Henreitta Leber M.D on 03/01/2017 at 11:35 AM  Between 7am to 6pm - Pager - [phone]  After 6pm go to [email or web address] - Proofreader  [facility]  Office  [phone]  CC: Primary care physician; Tracie Harrier, MD

## 2017-03-02 NOTE — Discharge Summary (Signed)
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 A-fib ([place])   . CVA (cerebral vascular accident) ([place])   . Diabetes mellitus without complication ([place])   . Hyperlipemia   . Hypertension   . Thyroid disease     HOSPITAL COURSE:   83 year old female with past medical history of attention, hyperlipidemia, diabetes, history of previous CVA, chronic atrial fibrillation, hypothyroidism presented to the hospital due to weakness and noted to have significant anemia.  1. GI bleed-patient suspected to have an upper GI bleed given melanotic stools and anemia. - -Status post endoscopy showing normal esophagus but Bleeding angiectasia is in the duodenum which were treated. -Hemoglobin was down to below 7 and transfused And hemoglobin has remained stable posttransfusion. She has had no further bleeding and is tolerating a regular diet well without any further melena. -Initially patient was placed on a Protonix drip now being discharged on oral PPI twice a day.  -Patient will resume her aspirin upon discharge.  2 acute blood loss anemia is-secondary to GI bleed. - Hemoglobin improved posttransfusion currently stable upon discharge.   3. Hyperlipidemia- she will continue atorvastatin.  4. Hypothyroidism- she will continue  Synthroid.  5. Diabetes type 2 without compensation-while in the hospital patient was on sliding scale insulin, but now being discharged on her oral glipizide.   6. Glaucoma- she will continue latanoprost eyedrops.  7. Essential hypertension- she will continue lisinopril and Norvasc  DISCHARGE CONDITIONS:   Stable.   CONSULTS OBTAINED:  Treatment Team:  [name], MD  DRUG ALLERGIES:   Allergies  Allergen Reactions  . Iodine Rash    DISCHARGE MEDICATIONS:   Allergies as of 03/01/2017      Reactions   Iodine Rash      Medication List    TAKE these medications   amLODipine 5 MG tablet Commonly known as:  NORVASC Take 1 tablet (5 mg total) by mouth daily.   aspirin 81 MG EC tablet Take 1 tablet (81 mg total) by mouth daily. What changed:  medication strength  how much to take   atorvastatin 10 MG tablet Commonly known as:  LIPITOR Take 1 tablet (10 mg total) by mouth daily.   docusate sodium 100 MG capsule Commonly known as:  COLACE Take 100 mg by mouth daily as needed for mild constipation.   glipiZIDE 10 MG 24 hr tablet Commonly known as:  GLUCOTROL XL Take 10 mg by mouth daily.   hydrocortisone 2.5 % cream Apply 1 application topically daily.   hydrOXYzine 25 MG tablet Commonly known as:  ATARAX/VISTARIL Take 25 mg by mouth every evening.   iron polysaccharides 150 MG capsule Commonly known as:  NIFEREX Take 150 mg by mouth daily.   latanoprost 0.005 % ophthalmic solution Commonly known as:  XALATAN Place 1 drop into both eyes at bedtime.   levothyroxine  112 MCG tablet Commonly known as:  SYNTHROID, LEVOTHROID Take 112 mcg by mouth daily.   lisinopril 40 MG tablet Commonly known as:  PRINIVIL,ZESTRIL Take 1 tablet (40 mg total) by mouth daily.   pantoprazole 40 MG tablet Commonly known as:  PROTONIX Take 1 tablet (40 mg total) by mouth 2 (two) times daily before a meal.   sucralfate 1 g tablet Commonly known as:  CARAFATE Take 1  g by mouth 3 (three) times daily.   vitamin B-12 1000 MCG tablet Commonly known as:  CYANOCOBALAMIN Take 1,000 mcg by mouth daily.         DISCHARGE INSTRUCTIONS:   DIET:  Cardiac diet and Diabetic diet  DISCHARGE CONDITION:  Stable  ACTIVITY:  Activity as tolerated  OXYGEN:  Home Oxygen: No.   Oxygen Delivery: room air  DISCHARGE LOCATION:  home   If you experience worsening of your admission symptoms, develop shortness of breath, life threatening emergency, suicidal or homicidal thoughts you must seek medical attention immediately by calling 911 or calling your MD immediately  if symptoms less severe.  You Must read complete instructions/literature along with all the possible adverse reactions/side effects for all the Medicines you take and that have been prescribed to you. Take any new Medicines after you have completely understood and accpet all the possible adverse reactions/side effects.   Please note  You were cared for by a hospitalist during your hospital stay. If you have any questions about your discharge medications or the care you received while you were in the hospital after you are discharged, you can call the unit and asked to speak with the hospitalist on call if the hospitalist that took care of you is not available. Once you are discharged, your primary care physician will handle any further medical issues. Please note that NO REFILLS for any discharge medications will be authorized once you are discharged, as it is imperative that you return to your primary care physician (or establish a relationship with a primary care physician if you do not have one) for your aftercare needs so that they can reassess your need for medications and monitor your lab values.   DATA REVIEW:   CBC  Recent Labs Lab 03/01/17 0503  WBC 5.9  HGB 9.5*  HCT 27.5*  PLT 319    Chemistries   Recent Labs Lab 02/27/17 1205 02/28/17 0656  NA 143 145  K 4.2 3.6  CL 111  116*  CO2 23 23  GLUCOSE 146* 56*  BUN 28* 18  CREATININE 2.06* 1.57*  CALCIUM 9.0 8.0*  AST 19  --   ALT 16  --   ALKPHOS 62  --   BILITOT 0.7  --     Cardiac Enzymes No results for input(s): TROPONINI in the last 168 hours.  Microbiology Results  No results found for this or any previous visit.  RADIOLOGY:  No results found.    Management plans discussed with the patient, family and they are in agreement.  CODE STATUS:  Code Status History    Date Active Date Inactive Code Status Order ID Comments User Context   02/27/2017  3:35 PM 03/01/2017  7:43 PM Full Code [id number]  Dustin Flock, MD Inpatient   02/27/2017  2:35 PM 02/27/2017  3:35 PM DNR [id number]  Dustin Flock, MD ED   12/20/2016  2:07 AM 12/20/2016  6:59 PM DNR [id number]  Saundra Shelling, MD Inpatient   12/18/2016  3:31 PM 12/19/2016  9:43 PM DNR [id number]  Henreitta Leber, MD ED   08/07/2016  5:58 PM 08/09/2016  2:29 PM Full Code [id number]  Vaughan Basta, MD Inpatient   07/26/2015  1:55 PM 07/27/2015  6:32 PM Full Code [id number]  Theodoro Grist, MD Inpatient    Advance Directive Documentation     Most Recent Value  Type of Advance Directive  Living will, Healthcare Power of Attorney  Pre-existing out of facility DNR order (yellow form or pink MOST form)  -  "MOST" Form in Place?  -      TOTAL TIME TAKING CARE OF THIS PATIENT: 40 minutes.    Henreitta Leber M.D on 03/02/2017 at 12:25 PM  Between 7am to 6pm - Pager - [phone]  After 6pm go to [email or web address] - Proofreader  [facility]  Office  [phone]  CC: Primary care physician; Tracie Harrier, MD

## 2017-03-04 ENCOUNTER — Encounter: Payer: Self-pay | Admitting: Gastroenterology

## 2017-05-14 ENCOUNTER — Inpatient Hospital Stay
Admission: EM | Admit: 2017-05-14 | Discharge: 2017-05-16 | DRG: 378 | Disposition: A | Discharge status: Home Health | Payer: Medicare Other | Attending: Internal Medicine | Admitting: Internal Medicine

## 2017-05-14 DIAGNOSIS — K922 Gastrointestinal hemorrhage, unspecified: Secondary | ICD-10-CM | POA: Diagnosis present

## 2017-05-14 DIAGNOSIS — N183 Chronic kidney disease, stage 3 (moderate): Secondary | ICD-10-CM | POA: Diagnosis present

## 2017-05-14 DIAGNOSIS — I482 Chronic atrial fibrillation: Secondary | ICD-10-CM | POA: Diagnosis present

## 2017-05-14 DIAGNOSIS — H409 Unspecified glaucoma: Secondary | ICD-10-CM | POA: Diagnosis present

## 2017-05-14 DIAGNOSIS — I480 Paroxysmal atrial fibrillation: Secondary | ICD-10-CM | POA: Diagnosis present

## 2017-05-14 DIAGNOSIS — E785 Hyperlipidemia, unspecified: Secondary | ICD-10-CM | POA: Diagnosis present

## 2017-05-14 DIAGNOSIS — I129 Hypertensive chronic kidney disease with stage 1 through stage 4 chronic kidney disease, or unspecified chronic kidney disease: Secondary | ICD-10-CM | POA: Diagnosis present

## 2017-05-14 DIAGNOSIS — Z7982 Long term (current) use of aspirin: Secondary | ICD-10-CM | POA: Diagnosis not present

## 2017-05-14 DIAGNOSIS — E039 Hypothyroidism, unspecified: Secondary | ICD-10-CM | POA: Diagnosis present

## 2017-05-14 DIAGNOSIS — Z8711 Personal history of peptic ulcer disease: Secondary | ICD-10-CM | POA: Diagnosis not present

## 2017-05-14 DIAGNOSIS — K5900 Constipation, unspecified: Secondary | ICD-10-CM | POA: Diagnosis present

## 2017-05-14 DIAGNOSIS — D500 Iron deficiency anemia secondary to blood loss (chronic): Secondary | ICD-10-CM | POA: Diagnosis present

## 2017-05-14 DIAGNOSIS — Z8673 Personal history of transient ischemic attack (TIA), and cerebral infarction without residual deficits: Secondary | ICD-10-CM

## 2017-05-14 DIAGNOSIS — K558 Other vascular disorders of intestine: Secondary | ICD-10-CM | POA: Diagnosis not present

## 2017-05-14 DIAGNOSIS — D509 Iron deficiency anemia, unspecified: Secondary | ICD-10-CM

## 2017-05-14 DIAGNOSIS — K31811 Angiodysplasia of stomach and duodenum with bleeding: Secondary | ICD-10-CM | POA: Diagnosis present

## 2017-05-14 DIAGNOSIS — Z7984 Long term (current) use of oral hypoglycemic drugs: Secondary | ICD-10-CM

## 2017-05-14 DIAGNOSIS — D62 Acute posthemorrhagic anemia: Secondary | ICD-10-CM | POA: Diagnosis present

## 2017-05-14 DIAGNOSIS — E1122 Type 2 diabetes mellitus with diabetic chronic kidney disease: Secondary | ICD-10-CM | POA: Diagnosis present

## 2017-05-14 DIAGNOSIS — Z79899 Other long term (current) drug therapy: Secondary | ICD-10-CM | POA: Diagnosis not present

## 2017-05-14 HISTORY — DX: Gastrointestinal hemorrhage, unspecified: K92.2

## 2017-05-14 LAB — FERRITIN: Ferritin: 6 ng/mL — ABNORMAL LOW (ref 11–307)

## 2017-05-14 LAB — COMPREHENSIVE METABOLIC PANEL
ALBUMIN: 3.7 g/dL (ref 3.5–5.0)
ALK PHOS: 63 U/L (ref 38–126)
ALT: 13 U/L — ABNORMAL LOW (ref 14–54)
ANION GAP: 7 (ref 5–15)
AST: 20 U/L (ref 15–41)
BUN: 26 mg/dL — ABNORMAL HIGH (ref 6–20)
CALCIUM: 8.4 mg/dL — AB (ref 8.9–10.3)
CO2: 21 mmol/L — AB (ref 22–32)
Chloride: 110 mmol/L (ref 101–111)
Creatinine, Ser: 1.89 mg/dL — ABNORMAL HIGH (ref 0.44–1.00)
GFR calc Af Amer: 27 mL/min — ABNORMAL LOW (ref >60)
GFR calc non Af Amer: 23 mL/min — ABNORMAL LOW (ref >60)
GLUCOSE: 189 mg/dL — AB (ref 65–99)
POTASSIUM: 3.8 mmol/L (ref 3.5–5.1)
SODIUM: 138 mmol/L (ref 135–145)
Total Bilirubin: 0.7 mg/dL (ref 0.3–1.2)
Total Protein: 6.9 g/dL (ref 6.5–8.1)

## 2017-05-14 LAB — CBC
HEMATOCRIT: 15.4 % — AB (ref 35.0–47.0)
Hemoglobin: 4.7 g/dL — CL (ref 12.0–16.0)
MCH: 19.4 pg — AB (ref 26.0–34.0)
MCHC: 30.4 g/dL — AB (ref 32.0–36.0)
MCV: 63.7 fL — AB (ref 80.0–100.0)
Platelets: 387 10*3/uL (ref 150–440)
RBC: 2.41 MIL/uL — ABNORMAL LOW (ref 3.80–5.20)
RDW: 19.5 % — ABNORMAL HIGH (ref 11.5–14.5)
WBC: 4.2 10*3/uL (ref 3.6–11.0)

## 2017-05-14 LAB — PREPARE RBC (CROSSMATCH)

## 2017-05-14 LAB — GLUCOSE, CAPILLARY: GLUCOSE-CAPILLARY: 116 mg/dL — AB (ref 65–99)

## 2017-05-14 MED ORDER — INSULIN ASPART 100 UNIT/ML ~~LOC~~ SOLN: 0.0000 [IU] | Freq: Every day | SUBCUTANEOUS | Status: DC

## 2017-05-14 MED ORDER — SUCRALFATE 1 G PO TABS
1.0000 g | ORAL_TABLET | Freq: Three times a day (TID) | ORAL | Status: DC
  Administered 2017-05-15 (×3): 1 g via ORAL
  Filled 2017-05-14 (×4): qty 1

## 2017-05-14 MED ORDER — ACETAMINOPHEN 650 MG RE SUPP: 650.0000 mg | Freq: Four times a day (QID) | RECTAL | Status: DC | PRN

## 2017-05-14 MED ORDER — LATANOPROST 0.005 % OP SOLN
1.0000 [drp] | Freq: Every day | OPHTHALMIC | Status: DC
  Administered 2017-05-14 – 2017-05-15 (×2): 1 [drp] via OPHTHALMIC
  Filled 2017-05-14: qty 2.5

## 2017-05-14 MED ORDER — INSULIN ASPART 100 UNIT/ML ~~LOC~~ SOLN
0.0000 [IU] | Freq: Three times a day (TID) | SUBCUTANEOUS | Status: DC
  Administered 2017-05-15: 2 [IU] via SUBCUTANEOUS
  Filled 2017-05-14: qty 1

## 2017-05-14 MED ORDER — SODIUM CHLORIDE 0.9 % IV SOLN
510.0000 mg | Freq: Once | INTRAVENOUS | Status: AC
  Administered 2017-05-15: 510 mg via INTRAVENOUS
  Filled 2017-05-14: qty 17

## 2017-05-14 MED ORDER — POLYSACCHARIDE IRON COMPLEX 150 MG PO CAPS
150.0000 mg | ORAL_CAPSULE | Freq: Every day | ORAL | Status: DC
  Administered 2017-05-15 – 2017-05-16 (×2): 150 mg via ORAL
  Filled 2017-05-14 (×3): qty 1

## 2017-05-14 MED ORDER — AMLODIPINE BESYLATE 5 MG PO TABS
5.0000 mg | ORAL_TABLET | Freq: Every day | ORAL | Status: DC
  Administered 2017-05-15 – 2017-05-16 (×2): 5 mg via ORAL
  Filled 2017-05-14 (×2): qty 1

## 2017-05-14 MED ORDER — ONDANSETRON HCL 4 MG/2ML IJ SOLN: 4.0000 mg | Freq: Four times a day (QID) | INTRAMUSCULAR | Status: DC | PRN

## 2017-05-14 MED ORDER — PANTOPRAZOLE SODIUM 40 MG IV SOLR
40.0000 mg | Freq: Two times a day (BID) | INTRAVENOUS | Status: DC
  Administered 2017-05-15 – 2017-05-16 (×3): 40 mg via INTRAVENOUS
  Filled 2017-05-14 (×3): qty 40

## 2017-05-14 MED ORDER — PEG 3350-KCL-NA BICARB-NACL 420 G PO SOLR
4000.0000 mL | Freq: Once | ORAL | Status: AC
  Administered 2017-05-15: 4000 mL via ORAL
  Filled 2017-05-14 (×2): qty 4000

## 2017-05-14 MED ORDER — SODIUM CHLORIDE 0.9 % IV SOLN
Freq: Once | INTRAVENOUS | Status: AC
  Administered 2017-05-14: 17:00:00 via INTRAVENOUS

## 2017-05-14 MED ORDER — LISINOPRIL 20 MG PO TABS
40.0000 mg | ORAL_TABLET | Freq: Every day | ORAL | Status: DC
  Administered 2017-05-15 – 2017-05-16 (×2): 40 mg via ORAL
  Filled 2017-05-14 (×3): qty 2

## 2017-05-14 MED ORDER — ATORVASTATIN CALCIUM 10 MG PO TABS
10.0000 mg | ORAL_TABLET | Freq: Every day | ORAL | Status: DC
  Administered 2017-05-14 – 2017-05-16 (×3): 10 mg via ORAL
  Filled 2017-05-14 (×3): qty 1

## 2017-05-14 MED ORDER — ACETAMINOPHEN 325 MG PO TABS: 650.0000 mg | ORAL_TABLET | Freq: Four times a day (QID) | ORAL | Status: DC | PRN

## 2017-05-14 MED ORDER — LEVOTHYROXINE SODIUM 112 MCG PO TABS
112.0000 ug | ORAL_TABLET | Freq: Every day | ORAL | Status: DC
  Administered 2017-05-15 – 2017-05-16 (×2): 112 ug via ORAL
  Filled 2017-05-14 (×2): qty 1

## 2017-05-14 MED ORDER — ONDANSETRON HCL 4 MG PO TABS: 4.0000 mg | ORAL_TABLET | Freq: Four times a day (QID) | ORAL | Status: DC | PRN

## 2017-05-14 MED ORDER — SODIUM CHLORIDE 0.9 % IV SOLN
80.0000 mg | Freq: Once | INTRAVENOUS | Status: DC
  Filled 2017-05-14 (×2): qty 80

## 2017-05-14 NOTE — ED Notes (Signed)
Report called to floor nurse lucrecia rn

## 2017-05-14 NOTE — ED Notes (Signed)
Date and time results received: 05/14/17 1608 (use smartphrase ".now" to insert current time)  Test: 4.7 Critical Value: hgb  Name of Provider Notified: McShane  Orders Received? Or Actions Taken?: Taken to bed 4

## 2017-05-14 NOTE — Progress Notes (Signed)
Pt is due for blood transfusion x 3 units and also to prep for endo and colonoscopy in the AM. Per GI, DR Marius Ditch hold prep until two units transfused, assess Pt and start prep.

## 2017-05-14 NOTE — Consult Note (Signed)
Patricia Darby, MD [address]  [place]  [place], [place] [address]  Main: [phone]  Fax: [phone] Pager: [phone]   Consultation  Referring Provider:     No ref. provider found Primary Care Physician:  Tracie Harrier, MD Primary Gastroenterologist:  Dr. Tiffany Kocher         Reason for Consultation:     Symptomatic anemia  Date of Admission:  05/14/2017 Date of Consultation:  05/15/2017         HPI:   Patricia Dudley is a 83 y.o. female with HTN, Afib, CVA, not on anti-coagulation, DM with known PUD, duodenal AVMs, chronic IDA, prior admissions with acute on chronic iron deficiency anemia readmitted with progressively worsening fatigue, exertional SOB, mild chest pain. She reports black formed stools, but taking oral iron. She has stage 3 CKD, and was found to have Hb of 4.7 from labs yesterday. She was told to go to ER for further evaluation. She denies abdominal apin, n/v, rectal bleeding, hematemesis. She is taking iron alternate days due to constipation. She received 3 u PRBCs. Had a very small BM today.   Denies NSAID use Not on antiplt agents or anticoagulants  GI Procedures:  EGD 08/08/2016 5 small Clean based ulcers in stomach antrum Normal esophagus and duodenum  EGD 02/28/2017 2 small AVMs in D2, APC performed  Colonoscopy 06/06/11 Diagnosis:  Part A: ASCENDING COLON POLYP COLD SNARED:  TUBULAR ADENOMA. NEGATIVE FOR HIGH GRADE DYSPLASIA AND MALIGNANCY.  .  Part B: SIGMOID COLON POLYP COLD SNARED:  TUBULAR ADENOMA. NEGATIVE FOR HIGH GRADE DYSPLASIA AND MALIGNANCY.   Past Medical History:  Diagnosis Date  . A-fib ([place])   . CVA (cerebral vascular accident) ([place])   . Diabetes mellitus without complication ([place])   . GI bleed   . Hyperlipemia   . Hypertension   . Thyroid disease     Past Surgical History:  Procedure Laterality Date  . ESOPHAGOGASTRODUODENOSCOPY N/A 08/08/2016   Procedure: ESOPHAGOGASTRODUODENOSCOPY (EGD);  Surgeon: [name], MD;  [facility] ENDOSCOPY;  Service: Endoscopy;  Laterality: N/A;  . ESOPHAGOGASTRODUODENOSCOPY (EGD) WITH PROPOFOL N/A 02/28/2017   Procedure: ESOPHAGOGASTRODUODENOSCOPY (EGD) WITH PROPOFOL;  Surgeon: [name], MD;  Location: [facility] ENDOSCOPY;  Service: Endoscopy;  Laterality: N/A;  . EYE SURGERY     left    Prior to Admission medications   Medication Sig Start Date End Date Taking? Authorizing Provider  amLODipine (NORVASC) 5 MG tablet Take 1 tablet (5 mg total) by mouth daily. 12/21/16  Yes Hillary Bow, MD  aspirin EC 81 MG EC tablet Take 1 tablet (81 mg total) by mouth daily. 03/01/17  Yes Henreitta Leber, MD  atorvastatin (LIPITOR) 10 MG tablet Take 1 tablet (10 mg total) by mouth daily. 12/19/16  Yes Sudini, Alveta Heimlich, MD  glipiZIDE (GLUCOTROL XL) 10 MG 24 hr tablet Take 10 mg by mouth daily.   Yes [provider]  iron polysaccharides (NIFEREX) 150 MG capsule Take 150 mg by mouth daily.   Yes [provider]  latanoprost (XALATAN) 0.005 % ophthalmic solution Place 1 drop into both eyes at bedtime.   Yes [provider]  levothyroxine (SYNTHROID, LEVOTHROID) 112 MCG tablet Take 112 mcg by mouth daily.   Yes [provider]  lisinopril (PRINIVIL,ZESTRIL) 40 MG tablet Take 1 tablet (40 mg total) by mouth daily. 07/27/15  Yes Mody, Ulice Bold, MD  pantoprazole (PROTONIX) 40 MG tablet Take 1 tablet (40 mg total) by mouth 2 (two) times  daily before a meal. 12/20/16  Yes Sudini, Alveta Heimlich, MD  sucralfate (CARAFATE) 1 g tablet Take 1 g by mouth 3 (three) times daily.    Yes [provider]    Family History  Problem Relation Age of Onset  . Diabetes Mother   . CVA Mother   . Hypertension Mother   . Diabetes Sister   . Diabetes Brother      Social History  Substance Use Topics  . Smoking status: Never Smoker  . Smokeless tobacco: Never Used  . Alcohol use No    Allergies as of 05/14/2017 - Review Complete 05/14/2017  Allergen Reaction  Noted  . Iodine Rash 07/26/2015    Review of Systems:    All systems reviewed and negative except where noted in HPI.   Physical Exam:  Vital signs in last 24 hours: Temp:  [97.6 F (36.4 C)-98.7 F (37.1 C)] 98.3 F (36.8 C) (09/20 0749) Pulse Rate:  [36-77] 76 (09/20 0749) Resp:  [15-26] 20 (09/20 0749) BP: (129-186)/(47-70) 149/49 (09/20 0749) SpO2:  [95 %-100 %] 99 % (09/20 0749) Weight:  [136 lb (61.7 kg)] 136 lb (61.7 kg) (09/19 1526) Last BM Date: 05/14/17 General:   Pleasant, cooperative in NAD Head:  Normocephalic and atraumatic. Eyes:   No icterus.   Conjunctiva pink. PERRLA. Ears:  Normal auditory acuity. Neck:  Supple; no masses or thyroidomegaly Lungs: Respirations even and unlabored. Lungs clear to auscultation bilaterally.   No wheezes, crackles, or rhonchi.  Heart:  Regular rate and rhythm;  Without murmur, clicks, rubs or gallops Abdomen:  Soft, nondistended, nontender. Normal bowel sounds. No appreciable masses or hepatomegaly.  No rebound or guarding.  Rectal:  Not performed. Msk:  Symmetrical without gross deformities.  Extremities:  Without edema, cyanosis or clubbing. Neurologic:  Alert and oriented x3;  grossly normal neurologically. Skin:  Intact without significant lesions or rashes. Psych:  Alert and cooperative. Normal affect.  LAB RESULTS:  Recent Labs  05/14/17 1537 05/15/17 0411  WBC 4.2 7.8  HGB 4.7* 8.8*  HCT 15.4* 26.3*  PLT 387 300   BMET  Recent Labs  05/14/17 1537 05/15/17 0411  NA 138 144  K 3.8 3.8  CL 110 116*  CO2 21* 22  GLUCOSE 189* 63*  BUN 26* 20  CREATININE 1.89* 1.65*  CALCIUM 8.4* 8.1*   LFT  Recent Labs  05/14/17 1537  PROT 6.9  ALBUMIN 3.7  AST 20  ALT 13*  ALKPHOS 63  BILITOT 0.7   PT/INR No results for input(s): LABPROT, INR in the last 72 hours.  STUDIES: No results found.    Impression / Plan:   Patricia Dudley is a 83 y.o. y/o female with HTN, Afib, CVA, not on anti-coagulation,  DM with known PUD, duodenal AVMs, chronic IDA, prior admissions with acute on chronic iron deficiency anemia readmitted with severe symptomatic anemia. She does not have overt or active GI blood loss. She received 3 u PRBCs since admission, Hb responded appropriately. Ferritin very low at 6.  - Recommend egd and colonoscopy, plan for tomorrow - VCE if unable to find GI source of bleed - ferriheme infusion today - Daily CBC - CLD - NPO past midnight - Bowel prep   Thank you for involving me in the care of this patient.      LOS: 1 day   Sherri Sear, MD  05/15/2017, 9:17 AM   Note: This dictation was prepared with Dragon dictation along with smaller phrase technology. Any  transcriptional errors that result from this process are unintentional.

## 2017-05-14 NOTE — Progress Notes (Signed)
Admitted with weakness,hemoglobin is 2.4,blood transfusion ordered,prep for EGD/COLONOSCOPY tomorrow,denies pain,vital signs within normal limits

## 2017-05-14 NOTE — ED Provider Notes (Signed)
[facility] Emergency Department Provider Note  ____________________________________________   First MD Initiated Contact with Patient 05/14/17 1610     (approximate)  I have reviewed the triage vital signs and the nursing notes.   HISTORY  Chief Complaint Abnormal Lab (Low Hcg)    HPI Patricia Dudley is a 83 y.o. female who sent to the emergency department by her nephrologist for low hemoglobin on an outpatient check. She has a history of diabetes mellitus, atrial fibrillation, in 3 months ago had a stroke. She's had worsening renal function at her primary care physician referred her Thursday to establish care with a nephrologist. She had outpatient blood work performed and was called today that it was abnormal to come to the emergency department. The patient does note frequent dark stools. She had a GI bleed about 2 months ago and was transfused and had an endoscopy with the results below. Her symptoms have been insidious in onset and slowly progressive.  02/28/17 Endoscopy:  - Normal esophagus. - Gastritis. - A few recently bleeding angiodysplastic lesions in the duodenum. Treated with argon beam coagulation. - No specimens collected.   Past Medical History:  Diagnosis Date  . A-fib ([place])   . CVA (cerebral vascular accident) ([place])   . Diabetes mellitus without complication ([place])   . GI bleed   . Hyperlipemia   . Hypertension   . Thyroid disease     Patient Active Problem List   Diagnosis Date Noted  . Acute posthemorrhagic anemia   . Gastritis without bleeding   . Angiodysplasia of stomach and duodenum with hemorrhage   . Acute GI bleeding 02/27/2017  . CVA (cerebral vascular accident) ([place]) 12/20/2016  . Hypertensive urgency 12/19/2016  . GI bleed 08/07/2016  . 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 Renal insufficiency 07/26/2015  . TIA (transient ischemic attack) 07/26/2015  . Vertigo 07/26/2015    Past Surgical History:    Procedure Laterality Date  . ESOPHAGOGASTRODUODENOSCOPY N/A 08/08/2016   Procedure: ESOPHAGOGASTRODUODENOSCOPY (EGD);  Surgeon: [name], MD;  Location: [facility] ENDOSCOPY;  Service: Endoscopy;  Laterality: N/A;  . ESOPHAGOGASTRODUODENOSCOPY (EGD) WITH PROPOFOL N/A 02/28/2017   Procedure: ESOPHAGOGASTRODUODENOSCOPY (EGD) WITH PROPOFOL;  Surgeon: [name], MD;  Location: [facility] ENDOSCOPY;  Service: Endoscopy;  Laterality: N/A;  . EYE SURGERY     left    Prior to Admission medications   Medication Sig Start Date End Date Taking? Authorizing Provider  amLODipine (NORVASC) 5 MG tablet Take 1 tablet (5 mg total) by mouth daily. 12/21/16   Hillary Bow, MD  aspirin EC 81 MG EC tablet Take 1 tablet (81 mg total) by mouth daily. 03/01/17   Henreitta Leber, MD  atorvastatin (LIPITOR) 10 MG tablet Take 1 tablet (10 mg total) by mouth daily. 12/19/16   Hillary Bow, MD  docusate sodium (COLACE) 100 MG capsule Take 100 mg by mouth daily as needed for mild constipation.    [provider]  glipiZIDE (GLUCOTROL XL) 10 MG 24 hr tablet Take 10 mg by mouth daily.    [provider]  hydrocortisone 2.5 % cream Apply 1 application topically daily.    [provider]  hydrOXYzine (ATARAX/VISTARIL) 25 MG tablet Take 25 mg by mouth every evening.     [provider]  iron polysaccharides (NIFEREX) 150 MG capsule Take 150 mg by mouth daily.    [provider]  latanoprost (XALATAN) 0.005 % ophthalmic solution Place 1 drop into both eyes at bedtime.  [provider]  levothyroxine (SYNTHROID, LEVOTHROID) 112 MCG tablet Take 112 mcg by mouth daily.    [provider]  lisinopril (PRINIVIL,ZESTRIL) 40 MG tablet Take 1 tablet (40 mg total) by mouth daily. 07/27/15   [name], MD  pantoprazole (PROTONIX) 40 MG tablet Take 1 tablet (40 mg total) by mouth 2 (two) times daily before a meal. 12/20/16   Sudini, Alveta Heimlich, MD  sucralfate (CARAFATE) 1 g  tablet Take 1 g by mouth 3 (three) times daily.     [provider]  vitamin B-12 (CYANOCOBALAMIN) 1000 MCG tablet Take 1,000 mcg by mouth daily.    [provider]    Allergies Iodine  Family History  Problem Relation Age of Onset  . Diabetes Mother   . CVA Mother   . Hypertension Mother   . Diabetes Sister   . Diabetes Brother     Social History Social History  Substance Use Topics  . Smoking status: Never Smoker  . Smokeless tobacco: Never Used  . Alcohol use No    Review of Systems Constitutional: No fever/chills Eyes: No visual changes. ENT: No sore throat. Cardiovascular: Denies chest pain. Respiratory: Denies shortness of breath. Gastrointestinal: No abdominal pain.  No nausea, no vomiting.  No diarrhea.  No constipation. Genitourinary: Negative for dysuria. Musculoskeletal: Negative for back pain. Skin: Negative for rash. Neurological: Negative for headaches, focal weakness or numbness.   ____________________________________________   PHYSICAL EXAM:  VITAL SIGNS: ED Triage Vitals  Enc Vitals Group     BP 05/14/17 1525 (!) 167/52     Pulse Rate 05/14/17 1525 (!) 43     Resp 05/14/17 1525 16     Temp 05/14/17 1525 98.7 F (37.1 C)     Temp Source 05/14/17 1525 Oral     SpO2 05/14/17 1525 100 %     Weight 05/14/17 1526 136 lb (61.7 kg)     Height 05/14/17 1526 5\' 5"  (1.651 m)     Head Circumference --      Peak Flow --      Pain Score --      Pain Loc --      Pain Edu? --      Excl. in [place]? --     Constitutional: Alert and oriented 4 pleasant cooperative speaks in full clear sentences Eyes: PERRL EOMI. Head: Atraumatic. Nose: No congestion/rhinnorhea. Mouth/Throat: No trismus Neck: No stridor.   Cardiovascular: Irregularly irregular and bradycardic no murmurs appreciated Respiratory: Increased respiratory effort.  No retractions. Lungs CTAB and moving good air Gastrointestinal: Soft nondistended nontender no rebound or  guarding no peritonitis Rectal exam chaperoned by female nurse Amy: Normal external exam strongly guaiac positive control positive brown stool Musculoskeletal: No lower extremity edema   Neurologic:  Normal speech and language. No gross focal neurologic deficits are appreciated. Skin:  Skin is warm, dry and intact. No rash noted. Psychiatric: Mood and affect are normal. Speech and behavior are normal.    ____________________________________________   DIFFERENTIAL includes but not limited to  Upper GI bleed, lower GI bleed, anemia of chronic disease ____________________________________________   LABS (all labs ordered are listed, but only abnormal results are displayed)  Labs Reviewed  COMPREHENSIVE METABOLIC PANEL - Abnormal; Notable for the following:       Result Value   CO2 21 (*)    Glucose, Bld 189 (*)    BUN 26 (*)    Creatinine, Ser 1.89 (*)    Calcium 8.4 (*)  ALT 13 (*)    GFR calc non Af Amer 23 (*)    GFR calc Af Amer 27 (*)    All other components within normal limits  CBC - Abnormal; Notable for the following:    RBC 2.41 (*)    Hemoglobin 4.7 (*)    HCT 15.4 (*)    MCV 63.7 (*)    MCH 19.4 (*)    MCHC 30.4 (*)    RDW 19.5 (*)    All other components within normal limits  POC OCCULT BLOOD, ED  TYPE AND SCREEN  PREPARE RBC (CROSSMATCH)    Blood work interpreted by me low hemoglobin with low MCV and high RDW consistent with iron deficiency. Low GFR of a roughly at her baseline __________________________________________  EKG  ED ECG REPORT I, Darel Hong, the attending physician, personally viewed and interpreted this ECG.  Date: 05/14/2017 Rate: 78 Rhythm: normal sinus rhythm QRS Axis: normal Intervals: normal ST/T Wave abnormalities: normal Narrative Interpretation: no evidence of acute ischemia. Normal sinus rhythm with frequent premature atrial  complexes  ____________________________________________  RADIOLOGY   ____________________________________________   PROCEDURES  Procedure(s) performed: no  Procedures  Critical Care performed: yes  CRITICAL CARE Performed by: Darel Hong   Total critical care time: 30 minutes  Critical care time was exclusive of separately billable procedures and treating other patients.  Critical care was necessary to treat or prevent imminent or life-threatening deterioration.  Critical care was time spent personally by me on the following activities: development of treatment plan with patient and/or surrogate as well as nursing, discussions with consultants, evaluation of patient's response to treatment, examination of patient, obtaining history from patient or surrogate, ordering and performing treatments and interventions, ordering and review of laboratory studies, ordering and review of radiographic studies, pulse oximetry and re-evaluation of patient's condition.   Observation: no ____________________________________________   INITIAL IMPRESSION / ASSESSMENT AND PLAN / ED COURSE  Pertinent labs & imaging results that were available during my care of the patient were reviewed by me and considered in my medical decision making (see chart for details).  While the patient is currently hemodynamically stable, she is guaiac positive with a dramatically lower hemoglobin from her last in the system about 6 weeks ago. She has a low MCV and a high RDW which is consistent with iron deficiency. Previous endoscopy as above and shows signs of recent bleeding. Her only anticoagulation at this time is aspirin. At this point she requires type-specific blood at least 3 units an inpatient admission. I will treat her as well with intravenous PPI to help stop the bleeding. She's not a known cirrhotic so we'll not treat her with octreotide or ceftriaxone.  I had a lengthy discussion with the patient and  family at bedside and they both agree to the plan.      ____________________________________________   FINAL CLINICAL IMPRESSION(S) / ED DIAGNOSES  Final diagnoses:  Gastrointestinal hemorrhage, unspecified gastrointestinal hemorrhage type  Iron deficiency anemia, unspecified iron deficiency anemia type      NEW MEDICATIONS STARTED DURING THIS VISIT:  New Prescriptions   No medications on file     Note:  This document was prepared using Dragon voice recognition software and may include unintentional dictation errors.     Darel Hong, MD 05/14/17 [phone]

## 2017-05-14 NOTE — ED Notes (Signed)
Pt sent to er for eval of low hgb.  Hx gi bleed, afib.  pt denies vomiting blood.  Pt reports dark stools.  No abd pain.  Pt alert, speech clear.  Iv in place skin warm and dry  Family with pt.

## 2017-05-14 NOTE — ED Notes (Signed)
Per dr Mable Paris, pt to be transfused on the floor not in the ER.

## 2017-05-14 NOTE — ED Triage Notes (Signed)
Pt states she was called by her PCP today to come to the ED due to a low Hgb of 4 from labs that were yesterday. States she has a hx of GI bleeding in the past, states she has black stools but also takes iron supplement. Denies any pain or SOB.Marland Kitchen

## 2017-05-14 NOTE — H&P (Signed)
[facility] [place] NAME: Patricia Dudley    MR#:  [id number]  DATE OF BIRTH:  Jan 16, 1930  DATE OF ADMISSION:  05/14/2017  PRIMARY CARE PHYSICIAN: Tracie Harrier, MD   REQUESTING/REFERRING PHYSICIAN: Dr. Darel Hong  CHIEF COMPLAINT:   Chief Complaint  Patient presents with  . Abnormal Lab    Low Hcg    HISTORY OF PRESENT ILLNESS:  Patricia Dudley  is a 83 y.o. female with a known history of Chronic atrial fibrillation, previous history of CVA, diabetes, previous history of GI bleed, hypertension, hyperlipidemia, hypothyroidism, recent admission for symptomatic anemia and GI bleed who presents to the hospital due to significantly low hemoglobin of 4.7 and also being symptomatic from it. Patient was referred to outpatient nephrology for chronic kidney disease and they did routine blood work and found her hemoglobin to be as low as 4.7 and therefore referred to the ER for further evaluation. Patient denies any acute hematuria or hematochezia, she does say that her stools are black in color due to the iron that she takes. She was noted to be heme positive but a rectal exam done by the ER physician. Given her profound anemia and her being symptomatic with it hospitalist services were contacted further treatment and evaluation. Patient does complain of profound weakness, exertional shortness of breath, some mild chest pain on exertion but no other associated symptoms presently.  PAST MEDICAL HISTORY:   Past Medical History:  Diagnosis Date  . A-fib ([place])   . CVA (cerebral vascular accident) ([place])   . Diabetes mellitus without complication ([place])   . GI bleed   . Hyperlipemia   . Hypertension   . Thyroid disease     PAST SURGICAL HISTORY:   Past Surgical History:  Procedure Laterality Date  . ESOPHAGOGASTRODUODENOSCOPY N/A 08/08/2016   Procedure: ESOPHAGOGASTRODUODENOSCOPY (EGD);  Surgeon: [name], MD;  Location: [facility]  ENDOSCOPY;  Service: Endoscopy;  Laterality: N/A;  . ESOPHAGOGASTRODUODENOSCOPY (EGD) WITH PROPOFOL N/A 02/28/2017   Procedure: ESOPHAGOGASTRODUODENOSCOPY (EGD) WITH PROPOFOL;  Surgeon: [name], MD;  Location: [facility] ENDOSCOPY;  Service: Endoscopy;  Laterality: N/A;  . EYE SURGERY     left    SOCIAL HISTORY:   Social History  Substance Use Topics  . Smoking status: Never Smoker  . Smokeless tobacco: Never Used  . Alcohol use No    FAMILY HISTORY:   Family History  Problem Relation Age of Onset  . Diabetes Mother   . CVA Mother   . Hypertension Mother   . Diabetes Sister   . Diabetes Brother     DRUG ALLERGIES:   Allergies  Allergen Reactions  . Iodine Rash    REVIEW OF SYSTEMS:   Review of Systems  Constitutional: Negative for fever and weight loss.  HENT: Negative for congestion, nosebleeds and tinnitus.   Eyes: Negative for blurred vision, double vision and redness.  Respiratory: Positive for shortness of breath. Negative for cough and hemoptysis.   Cardiovascular: Positive for chest pain. Negative for orthopnea, leg swelling and PND.  Gastrointestinal: Positive for blood in stool. Negative for abdominal pain, diarrhea, melena, nausea and vomiting.  Genitourinary: Negative for dysuria, hematuria and urgency.  Musculoskeletal: Negative for falls and joint pain.  Neurological: Positive for weakness. Negative for dizziness, tingling, sensory change, focal weakness, seizures and headaches.  Endo/Heme/Allergies: Negative for polydipsia. Does not bruise/bleed easily.  Psychiatric/Behavioral: Negative for depression and memory loss. The patient is not nervous/anxious.     MEDICATIONS  AT HOME:   Prior to Admission medications   Medication Sig Start Date End Date Taking? Authorizing Provider  amLODipine (NORVASC) 5 MG tablet Take 1 tablet (5 mg total) by mouth daily. 12/21/16  Yes Hillary Bow, MD  aspirin EC 81 MG EC tablet Take 1 tablet (81 mg total) by mouth  daily. 03/01/17  Yes Henreitta Leber, MD  atorvastatin (LIPITOR) 10 MG tablet Take 1 tablet (10 mg total) by mouth daily. 12/19/16  Yes Sudini, Alveta Heimlich, MD  glipiZIDE (GLUCOTROL XL) 10 MG 24 hr tablet Take 10 mg by mouth daily.   Yes [provider]  iron polysaccharides (NIFEREX) 150 MG capsule Take 150 mg by mouth daily.   Yes [provider]  latanoprost (XALATAN) 0.005 % ophthalmic solution Place 1 drop into both eyes at bedtime.   Yes [provider]  levothyroxine (SYNTHROID, LEVOTHROID) 112 MCG tablet Take 112 mcg by mouth daily.   Yes [provider]  lisinopril (PRINIVIL,ZESTRIL) 40 MG tablet Take 1 tablet (40 mg total) by mouth daily. 07/27/15  Yes [name], MD  pantoprazole (PROTONIX) 40 MG tablet Take 1 tablet (40 mg total) by mouth 2 (two) times daily before a meal. 12/20/16  Yes Sudini, Srikar, MD  sucralfate (CARAFATE) 1 g tablet Take 1 g by mouth 3 (three) times daily.    Yes [provider]      VITAL SIGNS:  Blood pressure 135/61, pulse 76, temperature 98.7 F (37.1 C), temperature source Oral, resp. rate 17, height 5\' 5"  (1.651 m), weight 61.7 kg (136 lb), SpO2 100 %.  PHYSICAL EXAMINATION:  Physical Exam  GENERAL:  83 y.o.-year-old patient lying in the bed in no acute distress.  EYES: Pupils equal, round, reactive to light and accommodation. No scleral icterus. Extraocular muscles intact. Pale Conjunctiva.   HEENT: Head atraumatic, normocephalic. Oropharynx and nasopharynx clear. No oropharyngeal erythema, moist oral mucosa  NECK:  Supple, no jugular venous distention. No thyroid enlargement, no tenderness.  LUNGS: Normal breath sounds bilaterally, no wheezing, rales, rhonchi. No use of accessory muscles of respiration.  CARDIOVASCULAR: S1, S2 RRR. No murmurs, rubs, gallops, clicks.  ABDOMEN: Soft, nontender, nondistended. Bowel sounds present. No organomegaly or mass.  EXTREMITIES: No pedal edema, cyanosis, or clubbing. + 2  pedal & radial pulses b/l.   NEUROLOGIC: Cranial nerves II through XII are intact. No focal Motor or sensory deficits appreciated b/l PSYCHIATRIC: The patient is alert and oriented x 3.   SKIN: No obvious rash, lesion, or ulcer.   LABORATORY PANEL:   CBC  Recent Labs Lab 05/14/17 1537  WBC 4.2  HGB 4.7*  HCT 15.4*  PLT 387   ------------------------------------------------------------------------------------------------------------------  Chemistries   Recent Labs Lab 05/14/17 1537  NA 138  K 3.8  CL 110  CO2 21*  GLUCOSE 189*  BUN 26*  CREATININE 1.89*  CALCIUM 8.4*  AST 20  ALT 13*  ALKPHOS 63  BILITOT 0.7   ------------------------------------------------------------------------------------------------------------------  Cardiac Enzymes No results for input(s): TROPONINI in the last 168 hours. ------------------------------------------------------------------------------------------------------------------  RADIOLOGY:  No results found.   IMPRESSION AND PLAN:   83 year old female with past medical history of hypertension, hyperlipidemia, atrial fibrillation, hypothyroidism, previous history of GI bleed, symptomatic anemia who presents to the hospital due to weakness, chest pain and shortness of breath and noted to be significantly anemic.  1. GI bleed-this is a suspected diagnosis given patient's heme positive stools and significant anemia. -Patient is on iron supplements and therefore her stools are dark in  color, she denies any alopecia, hematemesis. -We'll transfuse HER 2 units of packed blood cells, follow hemoglobin. Place the patient on IV Protonix, we'll get a gastroenterology consult.  2. Symptomatic anemia-this causes of patient's weakness and shortness of breath. -Secondary to the GI bleed. Transfuse HER 2 units of packed red blood cells, follow hemoglobin. Hold aspirin.  3. CKD Stage III - Cr. Close to baseline.  - will monitor. Follows w/ Dr.  Candiss Norse as outpatient.   4. Hypothyroidism - cont. Synthroid.   5. HTN - cont. Lisinopril, Norvasc.   6. Diabetes type 2 without complication, hold glipizide, Place on sliding scale insulin.  7. Glaucoma-continue latanoprost eyedrops.  8. Hyperlipidemia-continue atorvastatin.    All the records are reviewed and case discussed with ED provider. Management plans discussed with the patient, family and they are in agreement.  CODE STATUS: Full code  TOTAL TIME TAKING CARE OF THIS PATIENT: 45 minutes.    Henreitta Leber M.D on 05/14/2017 at 4:58 PM  Between 7am to 6pm - Pager - [phone]  After 6pm go to [email or web address] - password [email or web address] [facility]  Office  [phone]  CC: Primary care physician; Tracie Harrier, MD

## 2017-05-15 LAB — BASIC METABOLIC PANEL
ANION GAP: 6 (ref 5–15)
BUN: 20 mg/dL (ref 6–20)
CHLORIDE: 116 mmol/L — AB (ref 101–111)
CO2: 22 mmol/L (ref 22–32)
Calcium: 8.1 mg/dL — ABNORMAL LOW (ref 8.9–10.3)
Creatinine, Ser: 1.65 mg/dL — ABNORMAL HIGH (ref 0.44–1.00)
GFR calc Af Amer: 32 mL/min — ABNORMAL LOW (ref >60)
GFR, EST NON AFRICAN AMERICAN: 27 mL/min — AB (ref >60)
Glucose, Bld: 63 mg/dL — ABNORMAL LOW (ref 65–99)
POTASSIUM: 3.8 mmol/L (ref 3.5–5.1)
SODIUM: 144 mmol/L (ref 135–145)

## 2017-05-15 LAB — CBC
HCT: 26.3 % — ABNORMAL LOW (ref 35.0–47.0)
HEMOGLOBIN: 8.8 g/dL — AB (ref 12.0–16.0)
MCH: 23.7 pg — ABNORMAL LOW (ref 26.0–34.0)
MCHC: 33.5 g/dL (ref 32.0–36.0)
MCV: 70.7 fL — AB (ref 80.0–100.0)
Platelets: 300 10*3/uL (ref 150–440)
RBC: 3.72 MIL/uL — ABNORMAL LOW (ref 3.80–5.20)
RDW: 21.3 % — ABNORMAL HIGH (ref 11.5–14.5)
WBC: 7.8 10*3/uL (ref 3.6–11.0)

## 2017-05-15 LAB — GLUCOSE, CAPILLARY
GLUCOSE-CAPILLARY: 148 mg/dL — AB (ref 65–99)
GLUCOSE-CAPILLARY: 101 mg/dL — AB (ref 65–99)
Glucose-Capillary: 54 mg/dL — ABNORMAL LOW (ref 65–99)
Glucose-Capillary: 190 mg/dL — ABNORMAL HIGH (ref 65–99)
Glucose-Capillary: 52 mg/dL — ABNORMAL LOW (ref 65–99)
Glucose-Capillary: 71 mg/dL (ref 65–99)

## 2017-05-15 LAB — HEMOGLOBIN AND HEMATOCRIT, BLOOD
HCT: 33.9 % — ABNORMAL LOW (ref 35.0–47.0)
Hemoglobin: 11.3 g/dL — ABNORMAL LOW (ref 12.0–16.0)

## 2017-05-15 MED ORDER — DEXTROSE 50 % IV SOLN: 25.0000 mL | Freq: Once | INTRAVENOUS | Status: DC

## 2017-05-15 MED ORDER — DEXTROSE 50 % IV SOLN
INTRAVENOUS | Status: AC
  Administered 2017-05-15: 50 mL
  Filled 2017-05-15: qty 50

## 2017-05-15 NOTE — Progress Notes (Signed)
Inpatient Diabetes Program Recommendations  AACE/ADA: New Consensus Statement on Inpatient Glycemic Control (2015)  Target Ranges:  Prepandial:   less than 140 mg/dL      Peak postprandial:   less than 180 mg/dL (1-2 hours)      Critically ill patients:  140 - 180 mg/dL   Lab Results  Component Value Date   GLUCAP 190 (H) 05/15/2017   HGBA1C 6.4 (H) 12/20/2016    Review of Glycemic Control  Results for [name], [name] (MRN [id number]) as of 05/15/2017 11:58  Ref. Range 05/14/2017 21:39 05/15/2017 07:34 05/15/2017 08:26 05/15/2017 11:30  Glucose-Capillary Latest Ref Range: 65 - 99 mg/dL 116 (H) 54 (L) 148 (H) 190 (H)    Diabetes history: Type 2 Outpatient Diabetes medications: Glipizide 10mg /day Current orders for Inpatient glycemic control: Novolog 0-9 units tid, Novolog 0-5 units qhs  Inpatient Diabetes Program Recommendations: Low blood sugar this am likely as a result of home Glipizide.  Please do not re-order Glipizide at discharge, given her age and renal function.    Agree with current orders but please ensure insulin is given at least 4 hours apart and within 1 hour of the blood sugar being checked.  Patricia Fitz, RN, BA, MHA, CDE Diabetes Coordinator Inpatient Diabetes Program  [phone] (Team Pager) [phone] ([facility]) 05/15/2017 12:00 PM

## 2017-05-15 NOTE — Progress Notes (Signed)
4oz of OJ given per policy. Will recheck B.S.

## 2017-05-15 NOTE — Progress Notes (Signed)
[facility] [place] NAME: Patricia Dudley    MR#:  [id number]  DATE OF BIRTH:  Jul 17, 1930  SUBJECTIVE:   Patient reports dark stools thought it was iron tablets Denies CP or SOB  REVIEW OF SYSTEMS:    Review of Systems  Constitutional: Negative for fever, chills weight loss +=generalized weakness HENT: Negative for ear pain, nosebleeds, congestion, facial swelling, rhinorrhea, neck pain, neck stiffness and ear discharge.   Respiratory: Negative for cough, shortness of breath, wheezing  Cardiovascular: Negative for chest pain, palpitations and leg swelling.  Gastrointestinal: Negative for heartburn, abdominal pain, vomiting, diarrhea or consitpation Genitourinary: Negative for dysuria, urgency, frequency, hematuria Musculoskeletal: Negative for back pain or joint pain Neurological: Negative for dizziness, seizures, syncope, focal weakness,  numbness and headaches.  Hematological: Does not bruise/bleed easily.  Psychiatric/Behavioral: Negative for hallucinations, confusion, dysphoric mood    Tolerating Diet: yes      DRUG ALLERGIES:   Allergies  Allergen Reactions  . Iodine Rash    VITALS:  Blood pressure (!) 171/70, pulse 76, temperature 97.6 F (36.4 C), temperature source Oral, resp. rate 18, height 5\' 5"  (1.651 m), weight 61.7 kg (136 lb), SpO2 100 %.  PHYSICAL EXAMINATION:  Constitutional: Appears well-developed and well-nourished. No distress. HENT: Normocephalic. Marland Kitchen Oropharynx is clear and moist.  Eyes: Conjunctivae and EOM are normal. PERRLA, no scleral icterus.  Neck: Normal ROM. Neck supple. No JVD. No tracheal deviation. CVS: RRR, S1/S2 +, no murmurs, no gallops, no carotid bruit.  Pulmonary: Effort and breath sounds normal, no stridor, rhonchi, wheezes, rales.  Abdominal: Soft. BS +,  no distension, tenderness, rebound or guarding.  Musculoskeletal: Normal range of motion. No edema and no tenderness.  Neuro:  Alert. CN 2-12 grossly intact. No focal deficits. Skin: Skin is warm and dry. No rash noted. Psychiatric: Normal mood and affect.      LABORATORY PANEL:   CBC  Recent Labs Lab 05/15/17 0411  WBC 7.8  HGB 8.8*  HCT 26.3*  PLT 300   ------------------------------------------------------------------------------------------------------------------  Chemistries   Recent Labs Lab 05/14/17 1537 05/15/17 0411  NA 138 144  K 3.8 3.8  CL 110 116*  CO2 21* 22  GLUCOSE 189* 63*  BUN 26* 20  CREATININE 1.89* 1.65*  CALCIUM 8.4* 8.1*  AST 20  --   ALT 13*  --   ALKPHOS 63  --   BILITOT 0.7  --    ------------------------------------------------------------------------------------------------------------------  Cardiac Enzymes No results for input(s): TROPONINI in the last 168 hours. ------------------------------------------------------------------------------------------------------------------  RADIOLOGY:  No results found.   ASSESSMENT AND PLAN:   83 year old female with a history of chronic kidney disease stage III, hypothyroidism, PAF who presents due to generalized weakness and found to have significant anemia.  1. Acute on chronic blood loss anemia with guaiac positive stools and symptomatic anemia She is status post 3 units PRBC and IV iron Plan for EGD and colonoscopy in a.m. Appreciate GI evaluation Continue PPI and Carafate  2. Chronic kidney disease stage III: Creatinine is at baseline 3. PAF: Not on anticoagulation due to GI bleed  4. Hypothyroidism: Continue Synthroid  5. Essential hypertension: Continue lisinopril and Norvasc 6. Diabetes: Continue sliding scale  7. Hyperlipidemia: Continue statin  Management plans discussed with the patient and family and she is in agreement.  CODE STATUS: FULL  TOTAL TIME TAKING CARE OF THIS PATIENT: 30 minutes.     POSSIBLE D/C 2 days, DEPENDING ON CLINICAL CONDITION.   Isiaah Cuervo,  Jadie Allington M.D on 05/15/2017  at 10:33 AM  Between 7am to 6pm - Pager - [phone] After 6pm go to [email or web address] - password [email or web address] [facility]  Office  [phone]  CC: Primary care physician; Tracie Harrier, MD  Note: This dictation was prepared with Dragon dictation along with smaller phrase technology. Any transcriptional errors that result from this process are unintentional.

## 2017-05-15 NOTE — Progress Notes (Signed)
[place] started at 1414.

## 2017-05-16 ENCOUNTER — Encounter
Admission: EM | Disposition: A | Discharge status: Home Health | Payer: Self-pay | Source: Home / Self Care | Attending: Internal Medicine

## 2017-05-16 ENCOUNTER — Inpatient Hospital Stay: Payer: Medicare Other | Admitting: Anesthesiology

## 2017-05-16 DIAGNOSIS — D509 Iron deficiency anemia, unspecified: Secondary | ICD-10-CM

## 2017-05-16 DIAGNOSIS — K31811 Angiodysplasia of stomach and duodenum with bleeding: Principal | ICD-10-CM

## 2017-05-16 DIAGNOSIS — K558 Other vascular disorders of intestine: Secondary | ICD-10-CM

## 2017-05-16 HISTORY — PX: ESOPHAGOGASTRODUODENOSCOPY: SHX5428

## 2017-05-16 HISTORY — PX: COLONOSCOPY: SHX5424

## 2017-05-16 LAB — TYPE AND SCREEN
ABO/RH(D): B POS
Antibody Screen: NEGATIVE
Unit division: 0
Unit division: 0
Unit division: 0

## 2017-05-16 LAB — CBC
HEMATOCRIT: 33 % — AB (ref 35.0–47.0)
HEMOGLOBIN: 11.2 g/dL — AB (ref 12.0–16.0)
MCH: 24.8 pg — AB (ref 26.0–34.0)
MCHC: 33.9 g/dL (ref 32.0–36.0)
MCV: 73 fL — AB (ref 80.0–100.0)
Platelets: 293 10*3/uL (ref 150–440)
RBC: 4.53 MIL/uL (ref 3.80–5.20)
RDW: 24 % — ABNORMAL HIGH (ref 11.5–14.5)
WBC: 5.8 10*3/uL (ref 3.6–11.0)

## 2017-05-16 LAB — BASIC METABOLIC PANEL
Anion gap: 6 (ref 5–15)
BUN: 20 mg/dL (ref 6–20)
CHLORIDE: 113 mmol/L — AB (ref 101–111)
CO2: 25 mmol/L (ref 22–32)
CREATININE: 1.52 mg/dL — AB (ref 0.44–1.00)
Calcium: 8.3 mg/dL — ABNORMAL LOW (ref 8.9–10.3)
GFR calc Af Amer: 35 mL/min — ABNORMAL LOW (ref >60)
GFR calc non Af Amer: 30 mL/min — ABNORMAL LOW (ref >60)
GLUCOSE: 99 mg/dL (ref 65–99)
Potassium: 3.8 mmol/L (ref 3.5–5.1)
Sodium: 144 mmol/L (ref 135–145)

## 2017-05-16 LAB — GLUCOSE, CAPILLARY
GLUCOSE-CAPILLARY: 94 mg/dL (ref 65–99)
Glucose-Capillary: 94 mg/dL (ref 65–99)
Glucose-Capillary: 97 mg/dL (ref 65–99)

## 2017-05-16 LAB — BPAM RBC
BLOOD PRODUCT EXPIRATION DATE: 201810062359
BLOOD PRODUCT EXPIRATION DATE: 201810082359
Blood Product Expiration Date: 201810052359
ISSUE DATE / TIME: 201809192006
ISSUE DATE / TIME: 201809192357
ISSUE DATE / TIME: 201809200400
UNIT TYPE AND RH: 7300
Unit Type and Rh: 1700
Unit Type and Rh: 7300

## 2017-05-16 SURGERY — EGD (ESOPHAGOGASTRODUODENOSCOPY)
Anesthesia: General

## 2017-05-16 MED ORDER — POLYSACCHARIDE IRON COMPLEX 150 MG PO CAPS: 150.0000 mg | ORAL_CAPSULE | Freq: Two times a day (BID) | ORAL | 0 refills | Status: DC

## 2017-05-16 MED ORDER — SODIUM CHLORIDE 0.9 % IV SOLN
INTRAVENOUS | Status: DC
  Administered 2017-05-16: 1000 mL via INTRAVENOUS
  Administered 2017-05-16: 07:00:00 via INTRAVENOUS

## 2017-05-16 MED ORDER — PROPOFOL 10 MG/ML IV BOLUS
INTRAVENOUS | Status: DC | PRN
  Administered 2017-05-16 (×2): 20 mg via INTRAVENOUS

## 2017-05-16 MED ORDER — PROPOFOL 500 MG/50ML IV EMUL
INTRAVENOUS | Status: DC | PRN
  Administered 2017-05-16: 75 ug/kg/min via INTRAVENOUS

## 2017-05-16 MED ORDER — PROPOFOL 500 MG/50ML IV EMUL
INTRAVENOUS | Status: AC
  Filled 2017-05-16: qty 50

## 2017-05-16 NOTE — Op Note (Addendum)
[facility] Gastroenterology Patient Name: Patricia Dudley Procedure Date: 05/16/2017 7:38 AM MRN: [id number] Account #: [id number] Date of Birth: 1930-01-02 Admit Type: Inpatient Age: 83 Room: [facility] ENDO ROOM 4 Gender: Female Note Status: Finalized Procedure:            Upper GI endoscopy Indications:          Iron deficiency anemia secondary to chronic blood loss,                        Iron deficiency anemia due to suspected upper                        gastrointestinal bleeding Providers:            Lin Landsman MD, MD Referring MD:         Tracie Harrier, MD (Referring MD) Medicines:            Monitored Anesthesia Care Complications:        No immediate complications. Estimated blood loss:                        Minimal. Procedure:            Pre-Anesthesia Assessment:                       - Prior to the procedure, a History and Physical was                        performed, and patient medications and allergies were                        reviewed. The patient is competent. The risks and                        benefits of the procedure and the sedation options and                        risks were discussed with the patient. All questions                        were answered and informed consent was obtained.                        Patient identification and proposed procedure were                        verified by the physician, the nurse, the                        anesthesiologist, the anesthetist and the technician in                        the pre-procedure area in the procedure room. Mental                        Status Examination: alert and oriented. Airway                        Examination: normal oropharyngeal airway and neck  mobility. Respiratory Examination: clear to                        auscultation. CV Examination: normal. Prophylactic                        Antibiotics: The patient does not require  prophylactic                        antibiotics. Prior Anticoagulants: The patient has                        taken aspirin, last dose was 1 day prior to procedure.                        ASA Grade Assessment: III - A patient with severe                        systemic disease. After reviewing the risks and                        benefits, the patient was deemed in satisfactory                        condition to undergo the procedure. The anesthesia plan                        was to use monitored anesthesia care (MAC). Immediately                        prior to administration of medications, the patient was                        re-assessed for adequacy to receive sedatives. The                        heart rate, respiratory rate, oxygen saturations, blood                        pressure, adequacy of pulmonary ventilation, and                        response to care were monitored throughout the                        procedure. The physical status of the patient was                        re-assessed after the procedure.                       After obtaining informed consent, the endoscope was                        passed under direct vision. Throughout the procedure,                        the patient's blood pressure, pulse, and oxygen  saturations were monitored continuously. The Endoscope                        was introduced through the mouth, and advanced to the                        third part of duodenum. The upper GI endoscopy was                        accomplished without difficulty. Findings:      The gastroesophageal junction and examined esophagus were normal.      The entire examined stomach was normal.      Fresh blood with clots seen in D2. Multiple small angiodysplastic       lesions, one AVM was with active bleeding were found in the second       portion of the duodenum. Coagulation for hemostasis using argon plasma       at 0.5  liters/minute and 20 watts and godl probe cautery was successful.       Estimated blood loss was minimal. Impression:           - Normal gastroesophageal junction and esophagus.                       - Normal stomach.                       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 Treated with argon plasma coagulation (APC).                       - No specimens collected. Recommendation:       - No aspirin, ibuprofen, naproxen, or other                        non-steroidal anti-inflammatory drugs.                       - Consider octreotide if recurrent bleeding from AVMs                       - Check CBC and iron studies regularly                       - Follow up in GI clinic                       - Proceed with colonoscopy Procedure Code(s):    --- Professional ---                       [phone], Esophagogastroduodenoscopy, flexible, transoral;                        with control of bleeding, any method Diagnosis Code(s):    --- Professional ---                       W41.324, Angiodysplasia of stomach and duodenum with  bleeding                       D50.0, Iron deficiency anemia secondary to blood loss                        (chronic)                       D50.9, Iron deficiency anemia, unspecified CPT copyright 2016 American Medical Association. All rights reserved. The codes documented in this report are preliminary and upon coder review may  be revised to meet current compliance requirements. Dr. Ulyess Mort Lin Landsman MD, MD 05/16/2017 8:37:59 AM This report has been signed electronically. Number of Addenda: 0 Note Initiated On: 05/16/2017 7:38 AM      [facility]

## 2017-05-16 NOTE — Progress Notes (Signed)
Pt given her scrip and DC instructions, Dr Benjie Karvonen contacted to verify that patient will not take glipizide at home.  Will f/u on her blood sugar with Dr Ginette Pitman on the 28th.  Dr Benjie Karvonen did not feel that a replacement anti hyperglycemic is necessary in the intervening week before patient sees Dr Ginette Pitman.  Pt and her daughter verbalized understanding of instructions, including not taking ASA, Aleve, or Advil.  IV site discontinued, bleeding controlled.  Patient left hospital in car with her daughter

## 2017-05-16 NOTE — Care Management (Signed)
Patient admitted with anemia.  Patient lives at home, her daughter is there part of the time.  PCP [place], pharmacy [facility].  Patient states that she ambulates with a cane in the home.  Orders have been placed for home health RN and PT.  Patient states that she has previously used [facility] and would like to utilize them again.  Referral made to [place] with [facility].  RNCM signing off.

## 2017-05-16 NOTE — Transfer of Care (Signed)
Immediate Anesthesia Transfer of Care Note  Patient: Patricia Dudley  Procedure(s) Performed: Procedure(s): ESOPHAGOGASTRODUODENOSCOPY (EGD) (N/A) COLONOSCOPY (N/A)  Patient Location: PACU and Endoscopy Unit  Anesthesia Type:General  Level of Consciousness: awake, alert  and oriented  Airway & Oxygen Therapy: Patient Spontanous Breathing and Patient connected to nasal cannula oxygen  Post-op Assessment: Report given to RN and Post -op Vital signs reviewed and stable  Post vital signs: Reviewed and stable  Last Vitals:  Vitals:   05/16/17 0436 05/16/17 0706  BP: (!) 169/76 (!) 156/89  Pulse: 80 95  Resp: 18 18  Temp: 37.1 C (!) 35.9 C  SpO2: 100% 100%    Last Pain:  Vitals:   05/16/17 0706  TempSrc: Oral         Complications: No apparent anesthesia complications

## 2017-05-16 NOTE — Anesthesia Preprocedure Evaluation (Signed)
Anesthesia Evaluation  Patient identified by MRN, date of birth, ID band Patient awake    Reviewed: Allergy & Precautions, H&P , NPO status , Patient's Chart, lab work & pertinent test results, reviewed documented beta blocker date and time   Airway Mallampati: II   Neck ROM: full    Dental  (+) Poor Dentition, Teeth Intact   Pulmonary neg pulmonary ROS,    Pulmonary exam normal        Cardiovascular Exercise Tolerance: Poor hypertension, On Medications negative cardio ROS Normal cardiovascular examAtrial Fibrillation  Rhythm:regular Rate:Normal     Neuro/Psych TIACVA, Residual Symptoms negative neurological ROS  negative psych ROS   GI/Hepatic negative GI ROS, Neg liver ROS,   Endo/Other  negative endocrine ROSdiabetes  Renal/GU Renal diseasenegative Renal ROS  negative genitourinary   Musculoskeletal   Abdominal   Peds  Hematology negative hematology ROS (+) anemia ,   Anesthesia Other Findings Past Medical History: No date: A-fib ([place]) No date: CVA (cerebral vascular accident) ([place]) No date: Diabetes mellitus without complication ([place]) No date: GI bleed No date: Hyperlipemia No date: Hypertension No date: Thyroid disease Past Surgical History: 08/08/2016: ESOPHAGOGASTRODUODENOSCOPY; N/A     Comment:  Procedure: ESOPHAGOGASTRODUODENOSCOPY (EGD);  Surgeon:               [name], MD;  Location[facility] ENDOSCOPY;                Service: Endoscopy;  Laterality: N/A; 02/28/2017: ESOPHAGOGASTRODUODENOSCOPY (EGD) WITH PROPOFOL; N/A     Comment:  Procedure: ESOPHAGOGASTRODUODENOSCOPY (EGD) WITH               PROPOFOL;  Surgeon: [name], MD;  Location: [facility]               ENDOSCOPY;  Service: Endoscopy;  Laterality: N/A; No date: EYE SURGERY     Comment:  left BMI    Body Mass Index:  22.63 kg/m     Reproductive/Obstetrics negative OB ROS                              Anesthesia Physical Anesthesia Plan  ASA: III and emergent  Anesthesia Plan: General   Post-op Pain Management:    Induction:   PONV Risk Score and Plan:   Airway Management Planned:   Additional Equipment:   Intra-op Plan:   Post-operative Plan:   Informed Consent: I have reviewed the patients History and Physical, chart, labs and discussed the procedure including the risks, benefits and alternatives for the proposed anesthesia with the patient or authorized representative who has indicated his/her understanding and acceptance.   Dental Advisory Given  Plan Discussed with: CRNA  Anesthesia Plan Comments:         Anesthesia Quick Evaluation

## 2017-05-16 NOTE — Anesthesia Post-op Follow-up Note (Signed)
Anesthesia QCDR form completed.        

## 2017-05-16 NOTE — Care Management Important Message (Signed)
Important Message  Patient Details  Name: Patricia Dudley MRN: [id number] Date of Birth: 10/26/1929   Medicare Important Message Given:  N/A - LOS <3 / Initial given by admissions    Beverly Sessions, RN 05/16/2017, 12:31 PM

## 2017-05-16 NOTE — Op Note (Signed)
[facility] Patient Name: Patricia Dudley Procedure Date: 05/16/2017 7:37 AM MRN: [id number] Account #: [id number] Date of Birth: [birth year] Admit Type: Inpatient Age: 83 Room: [facility] ENDO ROOM 4 Gender: Female Note Status: Finalized Procedure:            Colonoscopy Indications:          Melena Providers:            Lin Landsman MD, MD Referring MD:         Tracie Harrier, MD (Referring MD) Medicines:            Monitored Anesthesia Care Complications:        No immediate complications. Estimated blood loss: None. Procedure:            Pre-Anesthesia Assessment:                       - Prior to the procedure, a History and Physical was                        performed, and patient medications and allergies were                        reviewed. The patient is competent. The risks and                        benefits of the procedure and the sedation options and                        risks were discussed with the patient. All questions                        were answered and informed consent was obtained.                        Patient identification and proposed procedure were                        verified by the physician, the nurse, the                        anesthesiologist, the anesthetist and the technician in                        the pre-procedure area in the procedure room. Mental                        Status Examination: alert and oriented. Airway                        Examination: normal oropharyngeal airway and neck                        mobility. Respiratory Examination: clear to                        auscultation. CV Examination: normal. Prophylactic                        Antibiotics: The patient does not require prophylactic  antibiotics. Prior Anticoagulants: The patient has                        taken aspirin, last dose was 1 day prior to procedure.                        ASA Grade  Assessment: III - A patient with severe                        systemic disease. After reviewing the risks and                        benefits, the patient was deemed in satisfactory                        condition to undergo the procedure. The anesthesia plan                        was to use monitored anesthesia care (MAC). Immediately                        prior to administration of medications, the patient was                        re-assessed for adequacy to receive sedatives. The                        heart rate, respiratory rate, oxygen saturations, blood                        pressure, adequacy of pulmonary ventilation, and                        response to care were monitored throughout the                        procedure. The physical status of the patient was                        re-assessed after the procedure.                       After obtaining informed consent, the colonoscope was                        passed under direct vision. Throughout the procedure,                        the patient's blood pressure, pulse, and oxygen                        saturations were monitored continuously. The                        Colonoscope was introduced through the anus and                        advanced to the the cecum, identified by appendiceal  orifice and ileocecal valve. The colonoscopy was                        performed without difficulty. The patient tolerated the                        procedure well. The quality of the bowel preparation                        was adequate. Findings:      Hematin (altered blood/coffee-ground-like material) was found in the       entire colon.      The retroflexed view of the distal rectum and anal verge was normal and       showed no anal or rectal abnormalities.      The perianal and digital rectal examinations were normal. Impression:           - Blood/melena in the entire examined colon. The                         underlying mucosa appeared normal. No large lesions                        identified                       - The distal rectum and anal verge are normal on                        retroflexion view.                       - No specimens collected.                       - Melena from upper GI source, small bowel AVMs Recommendation:       - Return patient to hospital ward for possible                        discharge same day.                       - Resume previous diet today.                       - Continue present medications.                       - No aspirin, ibuprofen, naproxen, or other                        non-steroidal anti-inflammatory drugs.                       - Regular cbc and iron studies                       - Follow up in GI clinic Procedure Code(s):    --- Professional ---                       [phone], Colonoscopy, flexible; diagnostic, including  collection of specimen(s) by brushing or washing, when                        performed (separate procedure) Diagnosis Code(s):    --- Professional ---                       K92.2, Gastrointestinal hemorrhage, unspecified                       K92.1, Melena (includes Hematochezia) CPT copyright 2016 American Medical Association. All rights reserved. The codes documented in this report are preliminary and upon coder review may  be revised to meet current compliance requirements. Dr. Ulyess Mort Lin Landsman MD, MD 05/16/2017 8:42:15 AM This report has been signed electronically. Number of Addenda: 0 Note Initiated On: 05/16/2017 7:37 AM Scope Withdrawal Time: 0 hours 14 minutes 12 seconds  Total Procedure Duration: 0 hours 19 minutes 58 seconds       [facility]

## 2017-05-16 NOTE — Progress Notes (Signed)
EGD:  Actively bleeding AVM in second portion of duodenum. Fresh blood with clots in duodenum Multiple non-bleeding AVMs in duodenal sweep and D2, bleeding on contact Hemostasis achieved with APC and gold probe cautery Bulb and D3 were normal Normal esophagus and stomach  Colonoscopy:  Melena in entire colon Normal colon otherwise No tumor was identified  Normal retroflexion in rectum  Recs:  - Oral iron 2 times daily - Recommend to stop aspirin 81mg  as NSAIDs can exacerbate AVMs - Check CBC, ferritin periodically as outpt and replace with iron infusions as needed - GI clinic follow up in 4 weeks after discharge - If she develops recurrent bleeding from AVMs despite stopping aspirin, can consider octreotide - Can be discharged today from GI stand point - Resume diet - Avoid NSAIDs and anticoagulants  [name], MD [address]  [place]  [place], [place] [address]  Main: [phone]  Fax: [phone] Pager: [phone]

## 2017-05-16 NOTE — Discharge Summary (Addendum)
[facility] [place] NAME: Patricia Dudley    MR#:  [id number]  DATE OF BIRTH:  1930-04-04  DATE OF ADMISSION:  05/14/2017 ADMITTING PHYSICIAN: Henreitta Leber, MD  DATE OF DISCHARGE: 05/16/2017  PRIMARY CARE PHYSICIAN: Tracie Harrier, MD    ADMISSION DIAGNOSIS:  Gastrointestinal hemorrhage, unspecified gastrointestinal hemorrhage type [K92.2] Iron deficiency anemia, unspecified iron deficiency anemia type [D50.9]  DISCHARGE DIAGNOSIS:  Active Problems:   GI bleed   SECONDARY DIAGNOSIS:   Past Medical History:  Diagnosis Date  . A-fib ([place])   . CVA (cerebral vascular accident) ([place])   . Diabetes mellitus without complication ([place])   . GI bleed   . Hyperlipemia   . Hypertension   . Thyroid disease     HOSPITAL COURSE:   83 year old female with a history of chronic kidney disease stage III, hypothyroidism, PAF who presents due to generalized weakness and found to have significant anemia.  1. Acute on chronic blood loss anemia with guaiac positive stools and symptomatic anemia She is status post 3 units PRBC and IV iron  EGD shows actively bleeding AVM in second portion of duodenum. Fresh blood with clots in duodenum Multiple non-bleeding AVMs in duodenal sweep and D2, bleeding on contact Hemostasis achieved with APC and gold probe cautery Bulb and D3 were normal Normal esophagus and stomach  Colonoscopy:  Melena in entire colon Normal colon otherwise No tumor was identified  Normal retroflexion in rectum  Recs:  - Oral iron 2 times daily - Recommend to stop aspirin 81mg  as NSAIDs can exacerbate AVMs - Check CBC, ferritin periodically as outpt and replace with iron infusions as needed - GI clinic follow up in 4 weeks after discharge - If she develops recurrent bleeding from AVMs despite stopping aspirin, can consider octreotide  - Resume diet - Avoid NSAIDs and anticoagulants  2. Chronic kidney disease  stage III: Creatinine is at baseline 3. PAF: Not on anticoagulation due to GI bleed  4. Hypothyroidism: Continue Synthroid  5. Essential hypertension: Continue lisinopril and Norvasc 6. Diabetes:Diabetes coordinator has recommended due to age and renal function not to restart Glipizide. She was asked to log her blood sugars QAC and have them for her next PCP visit. At that time insulin or another oral medication can be considered.  7. Hyperlipidemia: Continue statin   DISCHARGE CONDITIONS AND DIET:   Stable Heart healthy diet  CONSULTS OBTAINED:  Treatment Team:  Lin Landsman, MD  DRUG ALLERGIES:   Allergies  Allergen Reactions  . Iodine Rash    DISCHARGE MEDICATIONS:   Discharge Medication List as of 05/16/2017 10:52 AM    CONTINUE these medications which have CHANGED   Details  iron polysaccharides (NIFEREX) 150 MG capsule Take 1 capsule (150 mg total) by mouth 2 (two) times daily., Starting Fri 05/16/2017, Print      CONTINUE these medications which have NOT CHANGED   Details  amLODipine (NORVASC) 5 MG tablet Take 1 tablet (5 mg total) by mouth daily., Starting Sat 12/21/2016, Normal    atorvastatin (LIPITOR) 10 MG tablet Take 1 tablet (10 mg total) by mouth daily., Starting Thu 12/19/2016, Normal    latanoprost (XALATAN) 0.005 % ophthalmic solution Place 1 drop into both eyes at bedtime., Historical Med    levothyroxine (SYNTHROID, LEVOTHROID) 112 MCG tablet Take 112 mcg by mouth daily., Historical Med    lisinopril (PRINIVIL,ZESTRIL) 40 MG tablet Take 1 tablet (40 mg total) by mouth daily., Starting Thu 07/27/2015,  Normal    pantoprazole (PROTONIX) 40 MG tablet Take 1 tablet (40 mg total) by mouth 2 (two) times daily before a meal., Starting Fri 12/20/2016, Normal      STOP taking these medications     aspirin EC 81 MG EC tablet      glipiZIDE (GLUCOTROL XL) 10 MG 24 hr tablet      sucralfate (CARAFATE) 1 g tablet           Today   CHIEF  COMPLAINT:  Seen after EGD   VITAL SIGNS:  Blood pressure 130/74, pulse (!) 44, temperature 98.3 F (36.8 C), temperature source Oral, resp. rate 18, height 5\' 5"  (1.651 m), weight 61.7 kg (136 lb), SpO2 100 %.   REVIEW OF SYSTEMS:  Review of Systems  Constitutional: Negative.  Negative for chills, fever and malaise/fatigue.  HENT: Negative.  Negative for ear discharge, ear pain, hearing loss, nosebleeds and sore throat.   Eyes: Negative.  Negative for blurred vision and pain.  Respiratory: Negative.  Negative for cough, hemoptysis, shortness of breath and wheezing.   Cardiovascular: Negative.  Negative for chest pain, palpitations and leg swelling.  Gastrointestinal: Negative.  Negative for abdominal pain, blood in stool, diarrhea, nausea and vomiting.  Genitourinary: Negative.  Negative for dysuria.  Musculoskeletal: Negative.  Negative for back pain.  Skin: Negative.   Neurological: Negative for dizziness, tremors, speech change, focal weakness, seizures and headaches.  Endo/Heme/Allergies: Negative.  Does not bruise/bleed easily.  Psychiatric/Behavioral: Negative.  Negative for depression, hallucinations and suicidal ideas.     PHYSICAL EXAMINATION:  GENERAL:  83 y.o.-year-old patient lying in the bed with no acute distress.  NECK:  Supple, no jugular venous distention. No thyroid enlargement, no tenderness.  LUNGS: Normal breath sounds bilaterally, no wheezing, rales,rhonchi  No use of accessory muscles of respiration.  CARDIOVASCULAR: S1, S2 normal. No murmurs, rubs, or gallops.  ABDOMEN: Soft, non-tender, non-distended. Bowel sounds present. No organomegaly or mass.  EXTREMITIES: No pedal edema, cyanosis, or clubbing.  PSYCHIATRIC: The patient is alert and oriented x 3.  SKIN: No obvious rash, lesion, or ulcer.   DATA REVIEW:   CBC  Recent Labs Lab 05/16/17 0640  WBC 5.8  HGB 11.2*  HCT 33.0*  PLT 293    Chemistries   Recent Labs Lab 05/14/17 1537   05/16/17 0640  NA 138  < > 144  K 3.8  < > 3.8  CL 110  < > 113*  CO2 21*  < > 25  GLUCOSE 189*  < > 99  BUN 26*  < > 20  CREATININE 1.89*  < > 1.52*  CALCIUM 8.4*  < > 8.3*  AST 20  --   --   ALT 13*  --   --   ALKPHOS 63  --   --   BILITOT 0.7  --   --   < > = values in this interval not displayed.  Cardiac Enzymes No results for input(s): TROPONINI in the last 168 hours.  Microbiology Results  @[email or web address] @  RADIOLOGY:  No results found.    Discharge Medication List as of 05/16/2017 10:52 AM    CONTINUE these medications which have CHANGED   Details  iron polysaccharides (NIFEREX) 150 MG capsule Take 1 capsule (150 mg total) by mouth 2 (two) times daily., Starting Fri 05/16/2017, Print      CONTINUE these medications which have NOT CHANGED   Details  amLODipine (NORVASC) 5 MG tablet Take 1 tablet (5 mg total)  by mouth daily., Starting Sat 12/21/2016, Normal    atorvastatin (LIPITOR) 10 MG tablet Take 1 tablet (10 mg total) by mouth daily., Starting Thu 12/19/2016, Normal    latanoprost (XALATAN) 0.005 % ophthalmic solution Place 1 drop into both eyes at bedtime., Historical Med    levothyroxine (SYNTHROID, LEVOTHROID) 112 MCG tablet Take 112 mcg by mouth daily., Historical Med    lisinopril (PRINIVIL,ZESTRIL) 40 MG tablet Take 1 tablet (40 mg total) by mouth daily., Starting Thu 07/27/2015, Normal    pantoprazole (PROTONIX) 40 MG tablet Take 1 tablet (40 mg total) by mouth 2 (two) times daily before a meal., Starting Fri 12/20/2016, Normal      STOP taking these medications     aspirin EC 81 MG EC tablet      glipiZIDE (GLUCOTROL XL) 10 MG 24 hr tablet      sucralfate (CARAFATE) 1 g tablet           Management plans discussed with the patient and she is in agreement. Stable for discharge   Patient should follow up with GI 4 weeks  CODE STATUS:     Code Status Orders        Start     Ordered   05/14/17 1842  Full code  Continuous     05/14/17  1841    Code Status History    Date Active Date Inactive Code Status Order ID Comments User Context   02/27/2017  3:35 PM 03/01/2017  7:43 PM Full Code [id number]  Dustin Flock, MD Inpatient   02/27/2017  2:35 PM 02/27/2017  3:35 PM DNR [id number]  Dustin Flock, MD ED   12/20/2016  2:07 AM 12/20/2016  6:59 PM DNR [id number]  Saundra Shelling, MD Inpatient   12/18/2016  3:31 PM 12/19/2016  9:43 PM DNR [id number]  Henreitta Leber, MD ED   08/07/2016  5:58 PM 08/09/2016  2:29 PM Full Code [id number]  Vaughan Basta, MD Inpatient   07/26/2015  1:55 PM 07/27/2015  6:32 PM Full Code [id number]  Theodoro Grist, MD Inpatient      TOTAL TIME TAKING CARE OF THIS PATIENT: 37 minutes.    Note: This dictation was prepared with Dragon dictation along with smaller phrase technology. Any transcriptional errors that result from this process are unintentional.  Julita Ozbun M.D on 05/16/2017 at 3:01 PM  Between 7am to 6pm - Pager - [phone] After 6pm go to [email or web address] - password [email or web address]  Office  [phone]  CC: Primary care physician; Tracie Harrier, MD

## 2017-05-16 NOTE — Anesthesia Postprocedure Evaluation (Signed)
Anesthesia Post Note  Patient: Patricia Dudley  Procedure(s) Performed: Procedure(s) (LRB): ESOPHAGOGASTRODUODENOSCOPY (EGD) (N/A) COLONOSCOPY (N/A)  Patient location during evaluation: PACU Anesthesia Type: General Level of consciousness: awake and alert Pain management: pain level controlled Vital Signs Assessment: post-procedure vital signs reviewed and stable Respiratory status: spontaneous breathing, nonlabored ventilation, respiratory function stable and patient connected to nasal cannula oxygen Cardiovascular status: blood pressure returned to baseline and stable Postop Assessment: no apparent nausea or vomiting Anesthetic complications: no     Last Vitals:  Vitals:   05/16/17 0841 05/16/17 0851  BP: (!) 156/78   Pulse: 75 73  Resp: 18 19  Temp:    SpO2: 99% 98%    Last Pain:  Vitals:   05/16/17 0831  TempSrc: Tympanic                 Molli Barrows

## 2017-05-19 ENCOUNTER — Encounter: Payer: Self-pay | Admitting: Gastroenterology

## 2017-06-13 ENCOUNTER — Ambulatory Visit (INDEPENDENT_AMBULATORY_CARE_PROVIDER_SITE_OTHER): Payer: Medicare Other | Admitting: Gastroenterology

## 2017-06-13 ENCOUNTER — Other Ambulatory Visit
Admission: RE | Admit: 2017-06-13 | Discharge: 2017-06-13 | Disposition: A | Discharge status: Home / Self Care | Payer: Medicare Other | Source: Ambulatory Visit | Attending: Gastroenterology | Admitting: Gastroenterology

## 2017-06-13 ENCOUNTER — Encounter: Payer: Self-pay | Admitting: Gastroenterology

## 2017-06-13 VITALS — BP 147/73 | HR 56 | Temp 97.6°F | Ht 65.0 in | Wt 128.0 lb

## 2017-06-13 DIAGNOSIS — E785 Hyperlipidemia, unspecified: Secondary | ICD-10-CM | POA: Insufficient documentation

## 2017-06-13 DIAGNOSIS — D500 Iron deficiency anemia secondary to blood loss (chronic): Secondary | ICD-10-CM | POA: Diagnosis not present

## 2017-06-13 DIAGNOSIS — K558 Other vascular disorders of intestine: Secondary | ICD-10-CM

## 2017-06-13 DIAGNOSIS — E1121 Type 2 diabetes mellitus with diabetic nephropathy: Secondary | ICD-10-CM | POA: Insufficient documentation

## 2017-06-13 DIAGNOSIS — M810 Age-related osteoporosis without current pathological fracture: Secondary | ICD-10-CM | POA: Insufficient documentation

## 2017-06-13 DIAGNOSIS — K219 Gastro-esophageal reflux disease without esophagitis: Secondary | ICD-10-CM | POA: Insufficient documentation

## 2017-06-13 DIAGNOSIS — I10 Essential (primary) hypertension: Secondary | ICD-10-CM | POA: Insufficient documentation

## 2017-06-13 DIAGNOSIS — D649 Anemia, unspecified: Secondary | ICD-10-CM | POA: Insufficient documentation

## 2017-06-13 DIAGNOSIS — K5521 Angiodysplasia of colon with hemorrhage: Secondary | ICD-10-CM

## 2017-06-13 LAB — CBC
HCT: 38.6 % (ref 35.0–47.0)
HEMOGLOBIN: 13 g/dL (ref 12.0–16.0)
MCH: 25.2 pg — AB (ref 26.0–34.0)
MCHC: 33.6 g/dL (ref 32.0–36.0)
MCV: 75.1 fL — ABNORMAL LOW (ref 80.0–100.0)
PLATELETS: 314 10*3/uL (ref 150–440)
RBC: 5.14 MIL/uL (ref 3.80–5.20)
RDW: 25.8 % — AB (ref 11.5–14.5)
WBC: 6.2 10*3/uL (ref 3.6–11.0)

## 2017-06-13 LAB — IRON AND TIBC
Iron: 96 ug/dL (ref 28–170)
SATURATION RATIOS: 28 % (ref 10.4–31.8)
TIBC: 342 ug/dL (ref 250–450)
UIBC: 246 ug/dL

## 2017-06-13 LAB — VITAMIN B12: Vitamin B-12: 661 pg/mL (ref 180–914)

## 2017-06-13 LAB — FOLATE: FOLATE: 13.8 ng/mL (ref >5.9)

## 2017-06-13 NOTE — Progress Notes (Signed)
[name], MD [address]  [place]  [place], [place] [address]  Main: [phone]  Fax: [phone]    Gastroenterology Consultation  Referring Provider:     Tracie Harrier, MD Primary Care Physician:  Tracie Harrier, MD Primary Gastroenterologist:  Dr. [name] Reason for Consultation:     Iron deficiency anemia        HPI:   Patricia Dudley is a 83 y.o. y/o female referred by Dr. Tracie Harrier, MD  for consultation & management of chronic iron deficiency anemia secondary to blood loss from bleeding AVMs. She has h/o stage 3 CKD, HTN, Afib, CVA, not on anti-coagulation, DM with known PUD, duodenal AVMs, chronic IDA, prior admissions with acute on chronic iron deficiency anemia. She is here as a hospital follow-up. I saw her when she got admitted to the hospital on 9/19 secondary to severe symptomatic iron deficiency anemia, hemoglobin 4.7 and melena. She received 3 u PRBCs. I did her upper endoscopy which revealed bleeding AVMs in duodenum and that were treated with APC and gold probe. She was discharged on 05/16/2017. Her hemoglobin at the time of discharge was 11, ferritin 6. She received IV iron in the hospital. She recently saw her PCP about 2 weeks ago and her hemoglobin was 11. She is currently taking oral iron 150 mg once daily. Her daughter increased it to twice daily and she reported constipation. She does not want to take MiraLAX. She denies any abdominal pain, black tarry stools. Her stools are formed, one bowel movement daily associated with some perianal itching. She denies any rectal bleeding, nausea, epigastric pain, hematemesis. Overall, she feels stronger with good energy levels. She stopped baby aspirin as recommended since her discharge from hospital.  Denies NSAID use Not on antiplt agents or anticoagulants  GI Procedures: 05/16/17 EGD: Actively bleeding AVM in second portion of duodenum. Fresh blood with clots in duodenum Multiple  non-bleeding AVMs in duodenal sweep and D2, bleeding on contact Hemostasis achieved with APC and gold probe cautery Bulb and D3 were normal Normal esophagus and stomach  Colonoscopy: Melena in entire colon Underlying mucosa of the colon was normal No tumor was identified  Normal retroflexion in rectum  EGD 08/08/2016 5 small Clean based ulcers in stomach antrum Normal esophagus and duodenum  EGD 02/28/2017 2 small AVMs in D2, APC performed  Colonoscopy 06/06/11 Diagnosis:  Part A: ASCENDING COLON POLYP COLD SNARED:  TUBULAR ADENOMA. NEGATIVE FOR HIGH GRADE DYSPLASIA AND MALIGNANCY.  .  Part B: SIGMOID COLON POLYP COLD SNARED:  TUBULAR ADENOMA. NEGATIVE FOR HIGH GRADE DYSPLASIA AND MALIGNANCY.   Past Medical History:  Diagnosis Date  . A-fib ([place])   . CVA (cerebral vascular accident) ([place])   . Diabetes mellitus without complication ([place])   . GI bleed   . Hyperlipemia   . Hypertension   . Thyroid disease     Past Surgical History:  Procedure Laterality Date  . COLONOSCOPY N/A 05/16/2017   Procedure: COLONOSCOPY;  Surgeon: Lin Landsman, MD;  Location: [facility] ENDOSCOPY;  Service: Gastroenterology;  Laterality: N/A;  . ESOPHAGOGASTRODUODENOSCOPY N/A 08/08/2016   Procedure: ESOPHAGOGASTRODUODENOSCOPY (EGD);  Surgeon: [name], MD;  Location: [facility] ENDOSCOPY;  Service: Endoscopy;  Laterality: N/A;  . ESOPHAGOGASTRODUODENOSCOPY N/A 05/16/2017   Procedure: ESOPHAGOGASTRODUODENOSCOPY (EGD);  Surgeon: Lin Landsman, MD;  Location: [facility] ENDOSCOPY;  Service: Gastroenterology;  Laterality: N/A;  . ESOPHAGOGASTRODUODENOSCOPY (EGD) WITH PROPOFOL N/A 02/28/2017   Procedure: ESOPHAGOGASTRODUODENOSCOPY (EGD) WITH PROPOFOL;  Surgeon: Allen Norris,  Darren, MD;  Location: [facility] ENDOSCOPY;  Service: Endoscopy;  Laterality: N/A;  . EYE SURGERY     left    Prior to Admission medications   Medication Sig Start Date End Date Taking? Authorizing Provider  fluticasone (FLONASE) 50  MCG/ACT nasal spray Place into the nose. 12/26/16 12/26/17 Yes [provider]  glipiZIDE (GLUCOTROL XL) 10 MG 24 hr tablet Take by mouth. 11/14/16 11/14/17 Yes [provider]  glucose blood (ONE TOUCH ULTRA TEST) test strip USE TO TEST BLOOD SUGAR TWICE DAILY 12/12/16  Yes [provider]  hydrocortisone 2.5 % cream Apply topically. 02/19/17 02/19/18 Yes [provider]  Jonetta Speak LANCETS 16X MISC USE TO TEST BLOOD SUGAR TWICE DAILY 12/12/16  Yes [provider]  amLODipine (NORVASC) 5 MG tablet Take 1 tablet (5 mg total) by mouth daily. 12/21/16   Hillary Bow, MD  aspirin EC 81 MG tablet Take by mouth.    [provider]  atorvastatin (LIPITOR) 10 MG tablet Take 1 tablet (10 mg total) by mouth daily. 12/19/16   Hillary Bow, MD  hydrOXYzine (ATARAX/VISTARIL) 25 MG tablet Take by mouth.    [provider]  iron polysaccharides (NIFEREX) 150 MG capsule Take 1 capsule (150 mg total) by mouth 2 (two) times daily. 05/16/17   [name], MD  latanoprost (XALATAN) 0.005 % ophthalmic solution Place 1 drop into both eyes at bedtime.    [provider]  levothyroxine (SYNTHROID, LEVOTHROID) 112 MCG tablet Take 112 mcg by mouth daily.    [provider]  lisinopril (PRINIVIL,ZESTRIL) 40 MG tablet Take 1 tablet (40 mg total) by mouth daily. 07/27/15   [name], MD  pantoprazole (PROTONIX) 40 MG tablet Take 1 tablet (40 mg total) by mouth 2 (two) times daily before a meal. 12/20/16   Hillary Bow, MD    Family History  Problem Relation Age of Onset  . Diabetes Mother   . CVA Mother   . Hypertension Mother   . Diabetes Sister   . Diabetes Brother      Social History  Substance Use Topics  . Smoking status: Never Smoker  . Smokeless tobacco: Never Used  . Alcohol use No    Allergies as of 06/13/2017 - Review Complete 05/16/2017  Allergen Reaction Noted  . Ezetimibe Other (See Comments) 02/22/2014  . Iodine Rash  07/26/2015    Review of Systems:    All systems reviewed and negative except where noted in HPI.   Physical Exam:  BP (!) 147/73 (BP Location: Left Arm, Patient Position: Sitting, Cuff Size: Normal)   Pulse (!) 56   Temp 97.6 F (36.4 C) (Oral)   Ht 5\' 5"  (1.651 m)   Wt 128 lb (58.1 kg)   BMI 21.30 kg/m  No LMP recorded. Patient is postmenopausal.  General:   Alert,  Well-developed, well-nourished, pleasant and cooperative in NAD Head:  Normocephalic and atraumatic. Eyes:  Sclera clear, no icterus.   Conjunctiva pink. Ears:  Normal auditory acuity. Nose:  No deformity, discharge, or lesions. Mouth:  No deformity or lesions,oropharynx pink & moist. Neck:  Supple; no masses or thyromegaly. Lungs:  Respirations even and unlabored.  Clear throughout to auscultation.   No wheezes, crackles, or rhonchi. No acute distress. Heart:  Regular rate and rhythm; no murmurs, clicks, rubs, or gallops. Abdomen:  Normal bowel sounds.  No bruits.  Soft, non-tender and non-distended without masses, hepatosplenomegaly or hernias noted.  No guarding or rebound tenderness.   Rectal: Nor performed  Msk:  Symmetrical without gross deformities. Good, equal movement & strength bilaterally. Pulses:  Normal pulses noted. Extremities:  No clubbing or edema.  No cyanosis. Neurologic:  Alert and oriented x3;  grossly normal neurologically. Skin:  Intact without significant lesions or rashes. No jaundice. Psych:  Alert and cooperative. Normal mood and affect.  Imaging Studies: No recent abdominal imaging  Assessment and Plan:   Patricia Dudley is a 83 y.o. female with  h/o stage 3 CKD, HTN, Afib, CVA, not on anti-coagulation, DM with known PUD, duodenal AVMs, chronic IDA, prior admissions with acute on chronic iron deficiency anemia. She is here as a hospital follow-up. She recently underwent EGD and was found to have actively bleeding AVM in duodenum which was controlled with cautery. Her hemoglobin has  been stable since admission. There is no evidence of active bleeding.  - Increase oral iron to 2 times daily, she is taking only 150 mg tablet per day. Take Colace or MiraLAX for constipation - Check ferritin, B12, folate and CBC today - continue to avoid NSAIDs including baby aspirin  Follow up in 2 months   [name], MD

## 2017-08-11 ENCOUNTER — Telehealth: Payer: Self-pay | Admitting: Gastroenterology

## 2017-08-11 NOTE — Telephone Encounter (Signed)
Patients daughter [name] has been advised that her mother can take an OTC Iron supplement 325mg  once a day.  [name] said she will go to [facility] to pick up some for her mother.  Thanks [employer]

## 2017-08-11 NOTE — Telephone Encounter (Signed)
Does this patient need to take the same iron pills or another kind (OTC)? Please call [name] # [phone].

## 2020-05-05 ENCOUNTER — Encounter: Payer: Self-pay | Admitting: Emergency Medicine

## 2020-05-05 ENCOUNTER — Other Ambulatory Visit: Payer: Self-pay

## 2020-05-05 ENCOUNTER — Emergency Department: Payer: Medicare PPO

## 2020-05-05 ENCOUNTER — Emergency Department
Admission: EM | Admit: 2020-05-05 | Discharge: 2020-05-05 | Disposition: A | Discharge status: Home / Self Care | Payer: Medicare PPO | Attending: Emergency Medicine | Admitting: Emergency Medicine

## 2020-05-05 DIAGNOSIS — E1121 Type 2 diabetes mellitus with diabetic nephropathy: Secondary | ICD-10-CM | POA: Diagnosis not present

## 2020-05-05 DIAGNOSIS — Z79899 Other long term (current) drug therapy: Secondary | ICD-10-CM | POA: Insufficient documentation

## 2020-05-05 DIAGNOSIS — I129 Hypertensive chronic kidney disease with stage 1 through stage 4 chronic kidney disease, or unspecified chronic kidney disease: Secondary | ICD-10-CM | POA: Diagnosis not present

## 2020-05-05 DIAGNOSIS — R079 Chest pain, unspecified: Secondary | ICD-10-CM | POA: Diagnosis present

## 2020-05-05 DIAGNOSIS — F0390 Unspecified dementia without behavioral disturbance: Secondary | ICD-10-CM | POA: Diagnosis not present

## 2020-05-05 DIAGNOSIS — E039 Hypothyroidism, unspecified: Secondary | ICD-10-CM | POA: Insufficient documentation

## 2020-05-05 DIAGNOSIS — E1122 Type 2 diabetes mellitus with diabetic chronic kidney disease: Secondary | ICD-10-CM | POA: Insufficient documentation

## 2020-05-05 DIAGNOSIS — N183 Chronic kidney disease, stage 3 unspecified: Secondary | ICD-10-CM | POA: Insufficient documentation

## 2020-05-05 HISTORY — DX: Unspecified dementia, unspecified severity, without behavioral disturbance, psychotic disturbance, mood disturbance, and anxiety: F03.90

## 2020-05-05 LAB — BASIC METABOLIC PANEL
Anion gap: 9 (ref 5–15)
BUN: 26 mg/dL — ABNORMAL HIGH (ref 8–23)
CO2: 26 mmol/L (ref 22–32)
Calcium: 8.8 mg/dL — ABNORMAL LOW (ref 8.9–10.3)
Chloride: 104 mmol/L (ref 98–111)
Creatinine, Ser: 1.68 mg/dL — ABNORMAL HIGH (ref 0.44–1.00)
GFR calc Af Amer: 31 mL/min — ABNORMAL LOW (ref >60)
GFR calc non Af Amer: 27 mL/min — ABNORMAL LOW (ref >60)
Glucose, Bld: 123 mg/dL — ABNORMAL HIGH (ref 70–99)
Potassium: 4.3 mmol/L (ref 3.5–5.1)
Sodium: 139 mmol/L (ref 135–145)

## 2020-05-05 LAB — CBC
HCT: 36.1 % (ref 36.0–46.0)
Hemoglobin: 12.6 g/dL (ref 12.0–15.0)
MCH: 27.5 pg (ref 26.0–34.0)
MCHC: 34.9 g/dL (ref 30.0–36.0)
MCV: 78.6 fL — ABNORMAL LOW (ref 80.0–100.0)
Platelets: 275 10*3/uL (ref 150–400)
RBC: 4.59 MIL/uL (ref 3.87–5.11)
RDW: 14.4 % (ref 11.5–15.5)
WBC: 6.2 10*3/uL (ref 4.0–10.5)
nRBC: 0 % (ref 0.0–0.2)

## 2020-05-05 LAB — TROPONIN I (HIGH SENSITIVITY)
Troponin I (High Sensitivity): 8 ng/L (ref <18)
Troponin I (High Sensitivity): 9 ng/L (ref <18)

## 2020-05-05 LAB — LIPASE, BLOOD: Lipase: 86 U/L — ABNORMAL HIGH (ref 11–51)

## 2020-05-05 NOTE — ED Triage Notes (Signed)
Pt states that she is having some left sided chest pain underneath her left breast, states that when she takes a deep breath she feels it, states that it started yesterday morning

## 2020-05-05 NOTE — Discharge Instructions (Addendum)
As we discussed one of the blood tests did suggest that you might have some inflammation around your pancrease and this could be the cause of your pain. Please follow up with your PCP about that issues. Also please follow up with your cardiologist. Please seek medical attention for any high fevers, chest pain, shortness of breath, change in behavior, persistent vomiting, bloody stool or any other new or concerning symptoms.

## 2020-05-05 NOTE — ED Provider Notes (Signed)
[facility] Emergency Department Provider Note  ____________________________________________   I have reviewed the triage vital signs and the nursing notes.   HISTORY  Chief Complaint Chest Pain   History limited by: Not Limited   HPI Patricia Dudley is a 86 y.o. female who presents to the emergency department today because of concern for chest pain. The patient states that the pain started yesterday morning. She noticed it when she first woke up. It then gradually got worse throughout the day. The patient states that the pain was located below her left chest. Today the patient feels that the pain has improved. She denies any shortness of breath with the pain. Denies any recent travel or immobility. No history of blood clots. No history of cancer. The patient says that she called her cardiologists office and they told her they were concerned for a blood clot so she should go the emergency department.    Records reviewed. Per medical record review patient has a history of CVA, DM, HLD, HTN.   Past Medical History:  Diagnosis Date  . A-fib ([place])   . CVA (cerebral vascular accident) ([place])   . Dementia ([place])   . Diabetes mellitus without complication ([place])   . GI bleed   . Hyperlipemia   . Hypertension   . Thyroid disease     Patient Active Problem List   Diagnosis Date Noted  . Anemia, unspecified 06/13/2017  . Diabetic nephropathy ([place]) 06/13/2017  . GERD (gastroesophageal reflux disease) 06/13/2017  . Hyperlipidemia 06/13/2017  . Hypertension 06/13/2017  . Osteoporosis 06/13/2017  . Acute posthemorrhagic anemia   . Gastritis without bleeding   . Angiodysplasia of stomach and duodenum with hemorrhage   . Acute GI bleeding 02/27/2017  . CVA (cerebral vascular accident) ([place]) 12/20/2016  . Hypertensive urgency 12/19/2016  . GI bleed 08/07/2016  . Type 2 diabetes mellitus with stage 3 chronic kidney disease, without long-term current use of  insulin ([place]) 11/01/2015  . Chronic a-fib ([place]) 09/01/2015  . 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 Renal insufficiency 07/26/2015  . TIA (transient ischemic attack) 07/26/2015  . Vertigo 07/26/2015  . Acquired hypothyroidism 07/04/2015    Past Surgical History:  Procedure Laterality Date  . COLONOSCOPY N/A 05/16/2017   Procedure: COLONOSCOPY;  Surgeon: Lin Landsman, MD;  Location: [facility] ENDOSCOPY;  Service: Gastroenterology;  Laterality: N/A;  . ESOPHAGOGASTRODUODENOSCOPY N/A 08/08/2016   Procedure: ESOPHAGOGASTRODUODENOSCOPY (EGD);  Surgeon: [name], MD;  Location: [facility] ENDOSCOPY;  Service: Endoscopy;  Laterality: N/A;  . ESOPHAGOGASTRODUODENOSCOPY N/A 05/16/2017   Procedure: ESOPHAGOGASTRODUODENOSCOPY (EGD);  Surgeon: Lin Landsman, MD;  Location: [facility] ENDOSCOPY;  Service: Gastroenterology;  Laterality: N/A;  . ESOPHAGOGASTRODUODENOSCOPY (EGD) WITH PROPOFOL N/A 02/28/2017   Procedure: ESOPHAGOGASTRODUODENOSCOPY (EGD) WITH PROPOFOL;  Surgeon: [name], MD;  Location: [facility] ENDOSCOPY;  Service: Endoscopy;  Laterality: N/A;  . EYE SURGERY     left    Prior to Admission medications   Medication Sig Start Date End Date Taking? Authorizing Provider  amLODipine (NORVASC) 5 MG tablet Take 1 tablet (5 mg total) by mouth daily. 12/21/16   Hillary Bow, MD  atorvastatin (LIPITOR) 10 MG tablet Take 1 tablet (10 mg total) by mouth daily. 12/19/16   Hillary Bow, MD  fluticasone (FLONASE) 50 MCG/ACT nasal spray Place into the nose. 12/26/16 12/26/17  [provider]  glucose blood (ONE TOUCH ULTRA TEST) test strip USE TO TEST BLOOD SUGAR TWICE DAILY 12/12/16   [provider]  hydrOXYzine (ATARAX/VISTARIL) 25  MG tablet Take by mouth.    [provider]  iron polysaccharides (NIFEREX) 150 MG capsule Take 1 capsule (150 mg total) by mouth 2 (two) times daily. 05/16/17   [name], MD  latanoprost (XALATAN) 0.005 % ophthalmic solution Place 1  drop into both eyes at bedtime.    [provider]  levothyroxine (SYNTHROID, LEVOTHROID) 112 MCG tablet Take 112 mcg by mouth daily.    [provider]  lisinopril (PRINIVIL,ZESTRIL) 40 MG tablet Take 1 tablet (40 mg total) by mouth daily. 07/27/15   [name], MD  ONETOUCH DELICA LANCETS 63Z MISC USE TO TEST BLOOD SUGAR TWICE DAILY 12/12/16   [provider]  pantoprazole (PROTONIX) 40 MG tablet Take 1 tablet (40 mg total) by mouth 2 (two) times daily before a meal. Patient not taking: Reported on 06/13/2017 12/20/16   Hillary Bow, MD    Allergies Ezetimibe and Iodine  Family History  Problem Relation Age of Onset  . Diabetes Mother   . CVA Mother   . Hypertension Mother   . Diabetes Sister   . Diabetes Brother     Social History Social History   Tobacco Use  . Smoking status: Never Smoker  . Smokeless tobacco: Never Used  Vaping Use  . Vaping Use: Never used  Substance Use Topics  . Alcohol use: No  . Drug use: No    Review of Systems Constitutional: No fever/chills Eyes: No visual changes. ENT: No sore throat. Cardiovascular: Positive for left lower chest pain. Respiratory: Denies shortness of breath. Gastrointestinal: No abdominal pain.  No nausea, no vomiting.  No diarrhea.   Genitourinary: Negative for dysuria. Musculoskeletal: Negative for back pain. Skin: Negative for rash. Neurological: Negative for headaches, focal weakness or numbness.  ____________________________________________   PHYSICAL EXAM:  VITAL SIGNS: ED Triage Vitals  Enc Vitals Group     BP 05/05/20 1336 (!) 174/71     Pulse Rate 05/05/20 1336 76     Resp 05/05/20 1336 20     Temp 05/05/20 1336 98.7 F (37.1 C)     Temp Source 05/05/20 1336 Oral     SpO2 05/05/20 1336 100 %     Weight 05/05/20 1339 120 lb (54.4 kg)     Height 05/05/20 1339 5\' 4"  (1.626 m)     Head Circumference --      Peak Flow --      Pain Score 05/05/20 1349 6   Constitutional:  Alert and oriented.  Eyes: Conjunctivae are normal.  ENT      Head: Normocephalic and atraumatic.      Nose: No congestion/rhinnorhea.      Mouth/Throat: Mucous membranes are moist.      Neck: No stridor. Hematological/Lymphatic/Immunilogical: No cervical lymphadenopathy. Cardiovascular: Normal rate, regular rhythm.  No murmurs, rubs, or gallops.  Respiratory: Normal respiratory effort without tachypnea nor retractions. Breath sounds are clear and equal bilaterally. No wheezes/rales/rhonchi. Gastrointestinal: Soft and non tender. No rebound. No guarding.  Genitourinary: Deferred Musculoskeletal: Normal range of motion in all extremities. No lower extremity edema. Neurologic:  Normal speech and language. No gross focal neurologic deficits are appreciated.  Skin:  Skin is warm, dry and intact. No rash noted. Psychiatric: Mood and affect are normal. Speech and behavior are normal. Patient exhibits appropriate insight and judgment.  ____________________________________________    LABS (pertinent positives/negatives)  Lipase 86 Trop hs 8 to 9 BMP na 139, k 4.3, cl 104, glu 123, cr 1.68 CBC wbc 6.2, hgb 12.6, plt  275  ____________________________________________   EKG  INance Pear, attending physician, personally viewed and interpreted this EKG  EKG Time: 1332 Rate: 77 Rhythm: sinus rhythm with sinus arrhythmia Axis: normal Intervals: qtc 425 QRS: narrow ST changes: no st elevation Impression: abnormal ekg  ____________________________________________    RADIOLOGY  CXR Lungs clear  ____________________________________________   PROCEDURES  Procedures  ____________________________________________   INITIAL IMPRESSION / ASSESSMENT AND PLAN / ED COURSE  Pertinent labs & imaging results that were available during my care of the patient were reviewed by me and considered in my medical decision making (see chart for details).   Patient presented to the  emergency department today because of concerns for chest pain and possible blood clot per cardiologist.  This point it is somewhat unclear why the cardiologist the blood clot might be a likely etiology.  Patient denies any risk factors, is not hypoxic or tachycardic.  Work-up prior to my evaluation was negative for troponin and chest x-ray was clear.  Blood work did show a slight elevation of the lipase.  Patient does state that her pain is getting better so I do wonder if she might of had a slight episode of pancreatitis.  However did discuss CT angio with patient and family.  It turns out patient has an allergy to iodine.  I did offer to do the 4-hour prep for the CT scan however patient and family felt comfortable deferring at this time and felt like they would like to go home.  I think this is completely reasonable.  We discussed blood clot return precautions.   ____________________________________________   FINAL CLINICAL IMPRESSION(S) / ED DIAGNOSES  Final diagnoses:  Nonspecific chest pain     Note: This dictation was prepared with Dragon dictation. Any transcriptional errors that result from this process are unintentional     Nance Pear, MD 05/05/20 1815

## 2020-05-10 ENCOUNTER — Other Ambulatory Visit: Payer: Self-pay

## 2020-05-10 ENCOUNTER — Ambulatory Visit
Admission: RE | Admit: 2020-05-10 | Discharge: 2020-05-10 | Disposition: A | Discharge status: Home / Self Care | Payer: Medicare PPO | Source: Ambulatory Visit | Attending: Internal Medicine | Admitting: Internal Medicine

## 2020-05-10 ENCOUNTER — Other Ambulatory Visit: Payer: Self-pay | Admitting: Internal Medicine

## 2020-05-10 DIAGNOSIS — R748 Abnormal levels of other serum enzymes: Secondary | ICD-10-CM | POA: Diagnosis not present

## 2020-05-10 DIAGNOSIS — R109 Unspecified abdominal pain: Secondary | ICD-10-CM | POA: Diagnosis not present

## 2021-06-08 ENCOUNTER — Emergency Department: Payer: Medicare PPO

## 2021-06-08 ENCOUNTER — Other Ambulatory Visit: Payer: Self-pay

## 2021-06-08 ENCOUNTER — Emergency Department
Admission: EM | Admit: 2021-06-08 | Discharge: 2021-06-08 | Disposition: A | Discharge status: Home / Self Care | Payer: Medicare PPO | Attending: Emergency Medicine | Admitting: Emergency Medicine

## 2021-06-08 ENCOUNTER — Encounter: Payer: Self-pay | Admitting: Emergency Medicine

## 2021-06-08 DIAGNOSIS — R791 Abnormal coagulation profile: Secondary | ICD-10-CM | POA: Insufficient documentation

## 2021-06-08 DIAGNOSIS — Z5321 Procedure and treatment not carried out due to patient leaving prior to being seen by health care provider: Secondary | ICD-10-CM | POA: Insufficient documentation

## 2021-06-08 DIAGNOSIS — R410 Disorientation, unspecified: Secondary | ICD-10-CM | POA: Insufficient documentation

## 2021-06-08 LAB — CBC WITH DIFFERENTIAL/PLATELET
Abs Immature Granulocytes: 0.03 10*3/uL (ref 0.00–0.07)
Basophils Absolute: 0 10*3/uL (ref 0.0–0.1)
Basophils Relative: 0 %
Eosinophils Absolute: 0.1 10*3/uL (ref 0.0–0.5)
Eosinophils Relative: 1 %
HCT: 38.1 % (ref 36.0–46.0)
Hemoglobin: 12.6 g/dL (ref 12.0–15.0)
Immature Granulocytes: 0 %
Lymphocytes Relative: 22 %
Lymphs Abs: 2.3 10*3/uL (ref 0.7–4.0)
MCH: 28.1 pg (ref 26.0–34.0)
MCHC: 33.1 g/dL (ref 30.0–36.0)
MCV: 85 fL (ref 80.0–100.0)
Monocytes Absolute: 0.6 10*3/uL (ref 0.1–1.0)
Monocytes Relative: 6 %
Neutro Abs: 7.1 10*3/uL (ref 1.7–7.7)
Neutrophils Relative %: 71 %
Platelets: 267 10*3/uL (ref 150–400)
RBC: 4.48 MIL/uL (ref 3.87–5.11)
RDW: 14 % (ref 11.5–15.5)
WBC: 10.1 10*3/uL (ref 4.0–10.5)
nRBC: 0 % (ref 0.0–0.2)

## 2021-06-08 LAB — TROPONIN I (HIGH SENSITIVITY): Troponin I (High Sensitivity): 11 ng/L (ref <18)

## 2021-06-08 LAB — COMPREHENSIVE METABOLIC PANEL
ALT: 12 U/L (ref 0–44)
AST: 21 U/L (ref 15–41)
Albumin: 3.9 g/dL (ref 3.5–5.0)
Alkaline Phosphatase: 64 U/L (ref 38–126)
Anion gap: 9 (ref 5–15)
BUN: 29 mg/dL — ABNORMAL HIGH (ref 8–23)
CO2: 23 mmol/L (ref 22–32)
Calcium: 9 mg/dL (ref 8.9–10.3)
Chloride: 106 mmol/L (ref 98–111)
Creatinine, Ser: 2.07 mg/dL — ABNORMAL HIGH (ref 0.44–1.00)
GFR, Estimated: 22 mL/min — ABNORMAL LOW (ref >60)
Glucose, Bld: 193 mg/dL — ABNORMAL HIGH (ref 70–99)
Potassium: 4.5 mmol/L (ref 3.5–5.1)
Sodium: 138 mmol/L (ref 135–145)
Total Bilirubin: 0.9 mg/dL (ref 0.3–1.2)
Total Protein: 7.9 g/dL (ref 6.5–8.1)

## 2021-06-08 LAB — PROTIME-INR
INR: 1.1 (ref 0.8–1.2)
Prothrombin Time: 13.7 seconds (ref 11.4–15.2)

## 2021-06-08 NOTE — ED Triage Notes (Signed)
Pt arrives to ED via POV, with daughter, she reports that pt got up last night and was disoriented. Last known well was last evening. Daughter reports that she is not herself, "It is like she has forgot how to do everything." She is not eating or drinking per normal.

## 2021-06-08 NOTE — ED Triage Notes (Signed)
Patient to ED via POV with daughter for AMS. Patient's last well known was approx 8pm last night. Daughter states she was having trouble eating, walking, and not talking like normal. Hx of strokes.

## 2021-06-08 NOTE — ED Provider Notes (Signed)
Emergency Medicine Provider Triage Evaluation Note  Patricia Dudley , a 87 y.o. female  was evaluated in triage.  Pt complains of altered mental status, weakness.  According to the daughter who is with the patient who is the primary historian, patient was her normal self last night, went to bed roughly around 830.  Patient got up during the middle of the night and another one of her daughters noticed that she appeared more confused than normal.  There did not appear to be any deficits and the patient was put back in bed.  This was approximately 530 this morning.  Throughout the day the patient has appeared more confused, she has been doing tasks that are atypical for the patient.  She has been confused, not answering questions appropriately.  There does not appear to be any facial droop or unilateral weakness according to the daughter.  Patient does have a history of a previous stroke 4 years ago.  Unsure whether this was hemorrhagic or ischemic.  She does not take blood thinners.  No reported recent illnesses.  Patient did not been complaining of anything last night or today.  No reported fevers, congestion, cough, chest pain, abdominal pain, nausea vomiting.  Review of Systems  Positive: Altered mental status, confusion Negative: No appreciable facial droop, unilateral weakness according to the daughter.  No reports of chest pain, shortness of breath, cough, emesis, diarrhea or urinary changes according to the daughter  Physical Exam  There were no vitals taken for this visit. Gen:   Awake, no distress   Resp:  Normal effort  MSK:   Moves extremities without difficulty  Other:  Altered mental status.  Patient's last known well was 8:30 PM yesterday.  Increased confusion, performing an appropriate task.  No reported unilateral weakness according to the daughter.  No recent illnesses.  Medical Decision Making  Medically screening exam initiated at 3:58 PM.  Appropriate orders placed.  VINCENT KUNG was informed that the remainder of the evaluation will be completed by another provider, this initial triage assessment does not replace that evaluation, and the importance of remaining in the ED until their evaluation is complete.  Patient presents with altered mental status, confusion, performing a typical task for her.  Last known well was 830 last night.  Patient does have a history of CVA.  No reported fevers, congestion, sore throat, cough, chest pain, abdominal pain, vomiting, diarrhea, urinary changes according to the daughter.  At this time patient will have labs, chest x-ray, EKG, CT scan of the head, urinalysis.  Given the fact that last known well was 20 hours ago we will not initiate a code stroke at this time.   Brynda Peon 06/08/21 1608    Blake Divine, MD 06/09/21 Shelah Lewandowsky

## 2021-08-14 ENCOUNTER — Other Ambulatory Visit: Payer: Self-pay | Admitting: Internal Medicine

## 2021-08-14 DIAGNOSIS — Z9181 History of falling: Secondary | ICD-10-CM

## 2021-08-14 DIAGNOSIS — R413 Other amnesia: Secondary | ICD-10-CM

## 2021-08-14 DIAGNOSIS — S0990XA Unspecified injury of head, initial encounter: Secondary | ICD-10-CM

## 2021-08-14 DIAGNOSIS — R2681 Unsteadiness on feet: Secondary | ICD-10-CM

## 2021-08-16 ENCOUNTER — Emergency Department: Payer: Medicare PPO

## 2021-08-16 ENCOUNTER — Encounter: Payer: Self-pay | Admitting: Emergency Medicine

## 2021-08-16 ENCOUNTER — Emergency Department
Admission: EM | Admit: 2021-08-16 | Discharge: 2021-08-16 | Disposition: A | Discharge status: Home / Self Care | Payer: Medicare PPO | Attending: Emergency Medicine | Admitting: Emergency Medicine

## 2021-08-16 ENCOUNTER — Other Ambulatory Visit: Payer: Self-pay

## 2021-08-16 DIAGNOSIS — E1122 Type 2 diabetes mellitus with diabetic chronic kidney disease: Secondary | ICD-10-CM | POA: Insufficient documentation

## 2021-08-16 DIAGNOSIS — E039 Hypothyroidism, unspecified: Secondary | ICD-10-CM | POA: Diagnosis not present

## 2021-08-16 DIAGNOSIS — I129 Hypertensive chronic kidney disease with stage 1 through stage 4 chronic kidney disease, or unspecified chronic kidney disease: Secondary | ICD-10-CM | POA: Insufficient documentation

## 2021-08-16 DIAGNOSIS — N183 Chronic kidney disease, stage 3 unspecified: Secondary | ICD-10-CM | POA: Insufficient documentation

## 2021-08-16 DIAGNOSIS — I4891 Unspecified atrial fibrillation: Secondary | ICD-10-CM | POA: Insufficient documentation

## 2021-08-16 DIAGNOSIS — U071 COVID-19: Secondary | ICD-10-CM | POA: Insufficient documentation

## 2021-08-16 DIAGNOSIS — Z79899 Other long term (current) drug therapy: Secondary | ICD-10-CM | POA: Diagnosis not present

## 2021-08-16 DIAGNOSIS — R531 Weakness: Secondary | ICD-10-CM | POA: Diagnosis present

## 2021-08-16 LAB — COMPREHENSIVE METABOLIC PANEL
ALT: 19 U/L (ref 0–44)
AST: 29 U/L (ref 15–41)
Albumin: 3.4 g/dL — ABNORMAL LOW (ref 3.5–5.0)
Alkaline Phosphatase: 62 U/L (ref 38–126)
Anion gap: 5 (ref 5–15)
BUN: 26 mg/dL — ABNORMAL HIGH (ref 8–23)
CO2: 24 mmol/L (ref 22–32)
Calcium: 8.4 mg/dL — ABNORMAL LOW (ref 8.9–10.3)
Chloride: 109 mmol/L (ref 98–111)
Creatinine, Ser: 2.18 mg/dL — ABNORMAL HIGH (ref 0.44–1.00)
GFR, Estimated: 21 mL/min — ABNORMAL LOW (ref >60)
Glucose, Bld: 179 mg/dL — ABNORMAL HIGH (ref 70–99)
Potassium: 4.1 mmol/L (ref 3.5–5.1)
Sodium: 138 mmol/L (ref 135–145)
Total Bilirubin: 0.7 mg/dL (ref 0.3–1.2)
Total Protein: 7.1 g/dL (ref 6.5–8.1)

## 2021-08-16 LAB — RESP PANEL BY RT-PCR (FLU A&B, COVID) ARPGX2
Influenza A by PCR: NEGATIVE
Influenza B by PCR: NEGATIVE
SARS Coronavirus 2 by RT PCR: POSITIVE — AB

## 2021-08-16 LAB — CBC WITH DIFFERENTIAL/PLATELET
Abs Immature Granulocytes: 0.03 10*3/uL (ref 0.00–0.07)
Basophils Absolute: 0 10*3/uL (ref 0.0–0.1)
Basophils Relative: 0 %
Eosinophils Absolute: 0 10*3/uL (ref 0.0–0.5)
Eosinophils Relative: 0 %
HCT: 34.9 % — ABNORMAL LOW (ref 36.0–46.0)
Hemoglobin: 11.5 g/dL — ABNORMAL LOW (ref 12.0–15.0)
Immature Granulocytes: 0 %
Lymphocytes Relative: 10 %
Lymphs Abs: 0.7 10*3/uL (ref 0.7–4.0)
MCH: 27.3 pg (ref 26.0–34.0)
MCHC: 33 g/dL (ref 30.0–36.0)
MCV: 82.9 fL (ref 80.0–100.0)
Monocytes Absolute: 1 10*3/uL (ref 0.1–1.0)
Monocytes Relative: 14 %
Neutro Abs: 5.2 10*3/uL (ref 1.7–7.7)
Neutrophils Relative %: 76 %
Platelets: 264 10*3/uL (ref 150–400)
RBC: 4.21 MIL/uL (ref 3.87–5.11)
RDW: 14.5 % (ref 11.5–15.5)
WBC: 6.9 10*3/uL (ref 4.0–10.5)
nRBC: 0 % (ref 0.0–0.2)

## 2021-08-16 MED ORDER — MOLNUPIRAVIR EUA 200MG CAPSULE: 4.0000 | ORAL_CAPSULE | Freq: Two times a day (BID) | ORAL | 0 refills | Status: AC

## 2021-08-16 MED ORDER — ONDANSETRON HCL 4 MG PO TABS: 4.0000 mg | ORAL_TABLET | Freq: Three times a day (TID) | ORAL | 0 refills | Status: DC | PRN

## 2021-08-16 NOTE — ED Provider Notes (Signed)
[facility] Emergency Department Provider Note   ____________________________________________   I have reviewed the triage vital signs and the nursing notes.   HISTORY  Chief Complaint Weakness   History limited by: Dementia, some history obtained from daughter at bedside   HPI Patricia Dudley is a 87 y.o. female who presents to the emergency department today accompanied by family because of concern for weakness. Family member states that the patient has been weak since a family get together the other day. The patient herself states that she has been feeling okay. The patient has not had any difficulty with breathing. Denies any vomiting, however family states that her oral intake has been less today than normal. Patient has had covid vaccines and boosters.    Records reviewed. Per medical record review patient has a history of dementia, HLD, HTN.  Past Medical History:  Diagnosis Date   A-fib [facility])    CVA (cerebral vascular accident) ([place])    Dementia ([place])    Diabetes mellitus without complication ([place])    GI bleed    Hyperlipemia    Hypertension    Thyroid disease     Patient Active Problem List   Diagnosis Date Noted   Anemia, unspecified 06/13/2017   Diabetic nephropathy ([place]) 06/13/2017   GERD (gastroesophageal reflux disease) 06/13/2017   Hyperlipidemia 06/13/2017   Hypertension 06/13/2017   Osteoporosis 06/13/2017   Acute posthemorrhagic anemia    Gastritis without bleeding    Angiodysplasia of stomach and duodenum with hemorrhage    Acute GI bleeding 02/27/2017   CVA (cerebral vascular accident) ([place]) 12/20/2016   Hypertensive urgency 12/19/2016   GI bleed 08/07/2016   Type 2 diabetes mellitus with stage 3 chronic kidney disease, without long-term current use of insulin ([place]) 11/01/2015   Chronic a-fib ([place]) 09/01/2015   Malignant essential hypertension 07/26/2015   Renal insufficiency 07/26/2015   TIA (transient ischemic  attack) 07/26/2015   Vertigo 07/26/2015   Acquired hypothyroidism 07/04/2015    Past Surgical History:  Procedure Laterality Date   COLONOSCOPY N/A 05/16/2017   Procedure: COLONOSCOPY;  Surgeon: Lin Landsman, MD;  Location: [place];  Service: Gastroenterology;  Laterality: N/A;   ESOPHAGOGASTRODUODENOSCOPY N/A 08/08/2016   Procedure: ESOPHAGOGASTRODUODENOSCOPY (EGD);  Surgeon: [name], MD;  Location: [facility] ENDOSCOPY;  Service: Endoscopy;  Laterality: N/A;   ESOPHAGOGASTRODUODENOSCOPY N/A 05/16/2017   Procedure: ESOPHAGOGASTRODUODENOSCOPY (EGD);  Surgeon: Lin Landsman, MD;  Location: [facility] ENDOSCOPY;  Service: Gastroenterology;  Laterality: N/A;   ESOPHAGOGASTRODUODENOSCOPY (EGD) WITH PROPOFOL N/A 02/28/2017   Procedure: ESOPHAGOGASTRODUODENOSCOPY (EGD) WITH PROPOFOL;  Surgeon: [name], MD;  Location: [facility] ENDOSCOPY;  Service: Endoscopy;  Laterality: N/A;   EYE SURGERY     left    Prior to Admission medications   Medication Sig Start Date End Date Taking? Authorizing Provider  alendronate (FOSAMAX) 70 MG tablet Take 1 tablet by mouth every 7 (seven) days. 11/28/20 11/28/21 Yes [provider]  glipiZIDE (GLUCOTROL XL) 10 MG 24 hr tablet Take 1 tablet by mouth daily. 06/17/19  Yes [provider]  amLODipine (NORVASC) 5 MG tablet Take 1 tablet (5 mg total) by mouth daily. 12/21/16   Hillary Bow, MD  atorvastatin (LIPITOR) 10 MG tablet Take 1 tablet (10 mg total) by mouth daily. 12/19/16   Hillary Bow, MD  escitalopram (LEXAPRO) 5 MG tablet Take 5 mg by mouth daily. 07/31/21   [provider]  fluticasone (FLONASE) 50 MCG/ACT nasal spray Place into the nose. 12/26/16 12/26/17  [provider]  glucose blood (ONE TOUCH ULTRA TEST) test strip USE TO TEST BLOOD SUGAR TWICE DAILY 12/12/16   [provider]  hydrOXYzine (ATARAX/VISTARIL) 25 MG tablet Take by mouth.    [provider]  iron polysaccharides (NIFEREX) 150  MG capsule Take 1 capsule (150 mg total) by mouth 2 (two) times daily. 05/16/17   [name], MD  latanoprost (XALATAN) 0.005 % ophthalmic solution Place 1 drop into both eyes at bedtime.    [provider]  levothyroxine (SYNTHROID, LEVOTHROID) 112 MCG tablet Take 112 mcg by mouth daily.    [provider]  lisinopril (PRINIVIL,ZESTRIL) 40 MG tablet Take 1 tablet (40 mg total) by mouth daily. 07/27/15   [name], MD  ONETOUCH DELICA LANCETS 23N MISC USE TO TEST BLOOD SUGAR TWICE DAILY 12/12/16   [provider]  pantoprazole (PROTONIX) 40 MG tablet Take 1 tablet (40 mg total) by mouth 2 (two) times daily before a meal. Patient not taking: Reported on 06/13/2017 12/20/16   Hillary Bow, MD    Allergies Ezetimibe and Iodine  Family History  Problem Relation Age of Onset   Diabetes Mother    CVA Mother    Hypertension Mother    Diabetes Sister    Diabetes Brother     Social History Social History   Tobacco Use   Smoking status: Never   Smokeless tobacco: Never  Vaping Use   Vaping Use: Never used  Substance Use Topics   Alcohol use: No   Drug use: No    Review of Systems Constitutional: No fever/chills. Positive for generalized weakness.  Eyes: No visual changes. ENT: No sore throat. Cardiovascular: Denies chest pain. Respiratory: Denies shortness of breath. Gastrointestinal: Positive for decreased oral intake.  Genitourinary: Negative for dysuria. Musculoskeletal: Negative for back pain. Skin: Negative for rash. Neurological: Negative for headaches, focal weakness or numbness.  ____________________________________________   PHYSICAL EXAM:  VITAL SIGNS: ED Triage Vitals  Enc Vitals Group     BP 08/16/21 1230 (!) 157/81     Pulse Rate 08/16/21 1230 96     Resp 08/16/21 1230 17     Temp 08/16/21 1230 99.3 F (37.4 C)     Temp Source 08/16/21 1230 Oral     SpO2 08/16/21 1230 95 %     Weight 08/16/21 1224 138 lb 0.1 oz (62.6 kg)      Height 08/16/21 1224 5\' 5"  (1.651 m)   Constitutional: Awake and alert. Not completely oriented.  Eyes: Conjunctivae are normal.  ENT      Head: Normocephalic and atraumatic.      Nose: No congestion/rhinnorhea.      Mouth/Throat: Mucous membranes are moist.      Neck: No stridor. Hematological/Lymphatic/Immunilogical: No cervical lymphadenopathy. Cardiovascular: Normal rate, regular rhythm.  No murmurs, rubs, or gallops.  Respiratory: Normal respiratory effort without tachypnea nor retractions. Breath sounds are clear and equal bilaterally. No wheezes/rales/rhonchi. Gastrointestinal: Soft and non tender. No rebound. No guarding.  Genitourinary: Deferred Musculoskeletal: Normal range of motion in all extremities. No lower extremity edema. Neurologic:  Dementia. Moving all extremities.  Skin:  Skin is warm, dry and intact. No rash noted. ____________________________________________    LABS (pertinent positives/negatives)  CBC wbc 6.9, hgb 11.5, plt 264 CMP na 138, k 4.1, glu 179, cr 2.18 COVID positive, influenza negative ____________________________________________   EKG  I, Nance Pear, attending physician, personally viewed and interpreted this EKG  EKG Time: 1237 Rate: 96 Rhythm: sinus rhythm with sinus arrythmia  Axis: normal Intervals: qtc 442 QRS: narrow ST changes: no st elevation Impression: abnormal ekg  ____________________________________________    RADIOLOGY  CXR Minimal right basilar atelectasis  ____________________________________________   PROCEDURES  Procedures  ____________________________________________   INITIAL IMPRESSION / ASSESSMENT AND PLAN / ED COURSE  Pertinent labs & imaging results that were available during my care of the patient were reviewed by me and considered in my medical decision making (see chart for details).   Patient presented to the emergency department today accompanied by daughter because of concern for  weakness.  Patient also had some decreased oral intake today.  Patient did test positive for COVID here.  At this time I think likely patient's weakness is secondary to the COVID.  Patient is not hypoxic.  Blood work without any concerning abnormalities.  Patient has baseline kidney dysfunction.  Did discuss diagnosis of COVID with patient and family.  Encouraged continued oral hydration.  Will give patient prescription for antiviral and antiemetic should she develop nausea or vomiting.  Also discussed family obtaining a portable pulse oximeter so they can monitor O2 levels at home.    ____________________________________________   FINAL CLINICAL IMPRESSION(S) / ED DIAGNOSES  Final diagnoses:  COVID-19     Note: This dictation was prepared with Dragon dictation. Any transcriptional errors that result from this process are unintentional     Nance Pear, MD 08/16/21 [phone]

## 2021-08-16 NOTE — ED Triage Notes (Signed)
Presents from home with weakness for about 1 week  tested positive for COVID at home yesterday

## 2021-08-16 NOTE — Discharge Instructions (Signed)
As we discussed you can purchase a portable pulse oximeter to measure the oxygen levels. If oxygen levels go below 90 please return to the emergency department. Please encourage hydration as best you can, anti nausea medication has been prescribed. Please seek medical attention for any high fevers, chest pain, shortness of breath, change in behavior, persistent vomiting, bloody stool or any other new or concerning symptoms.

## 2021-08-30 ENCOUNTER — Other Ambulatory Visit: Payer: Self-pay

## 2021-08-30 ENCOUNTER — Ambulatory Visit
Admission: RE | Admit: 2021-08-30 | Discharge: 2021-08-30 | Disposition: A | Discharge status: Home / Self Care | Payer: Medicare PPO | Source: Ambulatory Visit | Attending: Internal Medicine | Admitting: Internal Medicine

## 2021-08-30 DIAGNOSIS — S0990XA Unspecified injury of head, initial encounter: Secondary | ICD-10-CM | POA: Insufficient documentation

## 2021-08-30 DIAGNOSIS — R413 Other amnesia: Secondary | ICD-10-CM | POA: Insufficient documentation

## 2021-08-30 DIAGNOSIS — R2681 Unsteadiness on feet: Secondary | ICD-10-CM | POA: Insufficient documentation

## 2021-08-30 DIAGNOSIS — Z9181 History of falling: Secondary | ICD-10-CM | POA: Insufficient documentation

## 2021-09-12 ENCOUNTER — Other Ambulatory Visit: Payer: Self-pay

## 2021-09-12 ENCOUNTER — Emergency Department: Payer: Medicare PPO

## 2021-09-12 ENCOUNTER — Emergency Department
Admission: EM | Admit: 2021-09-12 | Discharge: 2021-09-12 | Disposition: A | Discharge status: Home / Self Care | Payer: Medicare PPO | Attending: Emergency Medicine | Admitting: Emergency Medicine

## 2021-09-12 ENCOUNTER — Encounter: Payer: Self-pay | Admitting: Emergency Medicine

## 2021-09-12 DIAGNOSIS — M25551 Pain in right hip: Secondary | ICD-10-CM | POA: Diagnosis not present

## 2021-09-12 DIAGNOSIS — I10 Essential (primary) hypertension: Secondary | ICD-10-CM | POA: Insufficient documentation

## 2021-09-12 DIAGNOSIS — G8911 Acute pain due to trauma: Secondary | ICD-10-CM | POA: Insufficient documentation

## 2021-09-12 DIAGNOSIS — W06XXXA Fall from bed, initial encounter: Secondary | ICD-10-CM | POA: Diagnosis not present

## 2021-09-12 DIAGNOSIS — E119 Type 2 diabetes mellitus without complications: Secondary | ICD-10-CM | POA: Diagnosis not present

## 2021-09-12 DIAGNOSIS — F0390 Unspecified dementia without behavioral disturbance: Secondary | ICD-10-CM | POA: Diagnosis not present

## 2021-09-12 NOTE — ED Triage Notes (Signed)
Pt presents from home via [facility] with c/o fall. Pt reports she slid down side of bed onto stool and now c/o right sided hip pain. Pt has hx of dementia, per ems pt at baseline cognitive status.

## 2021-09-12 NOTE — Discharge Instructions (Signed)
Your hip x-ray today is unremarkable.  Take Tylenol as needed for pain and follow-up with your doctor please

## 2021-09-12 NOTE — ED Provider Notes (Signed)
[facility] Provider Note    Event Date/Time   First MD Initiated Contact with Patient 09/12/21 1349     (approximate)   History   Fall   HPI  Patricia Dudley is a 88 y.o. female with a history of CVA, hypertension, atrial fibrillation, diabetes who was sent to the ED due to a fall.  She has a history of dementia and is reported to be at her baseline cognitive function.  At her residence, she slid off the side of her bed onto a stool and afterward had right hip pain.  She denies any other acute symptoms.  States that she feels fine.     Physical Exam   Triage Vital Signs: ED Triage Vitals  Enc Vitals Group     BP 09/12/21 1343 (!) 152/84     Pulse Rate 09/12/21 1343 75     Resp 09/12/21 1343 18     Temp 09/12/21 1343 98.1 F (36.7 C)     Temp Source 09/12/21 1343 Oral     SpO2 09/12/21 1343 99 %     Weight --      Height 09/12/21 1340 5\' 5"  (1.651 m)     Head Circumference --      Peak Flow --      Pain Score --      Pain Loc --      Pain Edu? --      Excl. in [place]? --     Most recent vital signs: Vitals:   09/12/21 1445 09/12/21 1500  BP:  (!) 143/82  Pulse: (!) 40 (!) 38  Resp: 16 14  Temp:    SpO2: 100% 100%     General: Awake, no distress.  CV:  Good peripheral perfusion.  Regular rate and rhythm.  Symmetric pulses Resp:  Normal effort.  Clear to auscultation bilaterally Abd:  No distention.  Soft and nontender Other:  Mild right hip tenderness posteriorly.  No limb shortening.  Intact passive range of motion.   ED Results / Procedures / Treatments   Labs (all labs ordered are listed, but only abnormal results are displayed) Labs Reviewed - No data to display   EKG     RADIOLOGY X-ray right hip and pelvis viewed and interpreted by me, negative for fracture.  Radiology report reviewed.    PROCEDURES:  Critical Care performed: No  Procedures   MEDICATIONS ORDERED IN ED: Medications - No data to  display   IMPRESSION / MDM / [place] / ED COURSE  I reviewed the triage vital signs and the nursing notes.                              Patient presents with right hip pain after sliding off the side of her bed.  She is nontoxic, reassuring exam and reassuring vitals.  Low suspicion of fracture or other acute injury.  X-ray of the pelvis and right hip are unremarkable.  She is stable for discharge home.           FINAL CLINICAL IMPRESSION(S) / ED DIAGNOSES   Final diagnoses:  Hip pain, acute, right     Rx / DC Orders   ED Discharge Orders     None        Note:  This document was prepared using Dragon voice recognition software and may include unintentional dictation errors.   Carrie Mew, MD 09/12/21  1546 ° °

## 2021-09-12 NOTE — ED Notes (Signed)
Pt ambulated in the room with walker with minimal assist. Pt c/o of mild right leg pain. Pt placed back in bed.

## 2021-09-12 NOTE — ED Notes (Addendum)
Pt up to bathroom with walker with assistance.  Family with pt.  Pt alert, iv in place

## 2021-12-04 DIAGNOSIS — I69351 Hemiplegia and hemiparesis following cerebral infarction affecting right dominant side: Secondary | ICD-10-CM

## 2021-12-24 ENCOUNTER — Inpatient Hospital Stay (HOSPITAL_COMMUNITY): Payer: Medicare PPO

## 2021-12-24 ENCOUNTER — Emergency Department (HOSPITAL_COMMUNITY): Payer: Medicare PPO

## 2021-12-24 ENCOUNTER — Other Ambulatory Visit: Payer: Self-pay

## 2021-12-24 ENCOUNTER — Inpatient Hospital Stay (HOSPITAL_COMMUNITY)
Admission: EM | Admit: 2021-12-24 | Discharge: 2022-01-03 | DRG: 064 | Disposition: A | Discharge status: Rehabilitation Facility | Payer: Medicare PPO | Attending: Internal Medicine | Admitting: Internal Medicine

## 2021-12-24 DIAGNOSIS — E861 Hypovolemia: Secondary | ICD-10-CM | POA: Diagnosis not present

## 2021-12-24 DIAGNOSIS — I6330 Cerebral infarction due to thrombosis of unspecified cerebral artery: Secondary | ICD-10-CM | POA: Insufficient documentation

## 2021-12-24 DIAGNOSIS — I6389 Other cerebral infarction: Secondary | ICD-10-CM | POA: Diagnosis not present

## 2021-12-24 DIAGNOSIS — I9589 Other hypotension: Secondary | ICD-10-CM | POA: Diagnosis not present

## 2021-12-24 DIAGNOSIS — Z6820 Body mass index (BMI) 20.0-20.9, adult: Secondary | ICD-10-CM | POA: Diagnosis not present

## 2021-12-24 DIAGNOSIS — Z993 Dependence on wheelchair: Secondary | ICD-10-CM

## 2021-12-24 DIAGNOSIS — I6381 Other cerebral infarction due to occlusion or stenosis of small artery: Principal | ICD-10-CM | POA: Diagnosis present

## 2021-12-24 DIAGNOSIS — Z20822 Contact with and (suspected) exposure to covid-19: Secondary | ICD-10-CM | POA: Diagnosis present

## 2021-12-24 DIAGNOSIS — F0390 Unspecified dementia without behavioral disturbance: Secondary | ICD-10-CM | POA: Diagnosis present

## 2021-12-24 DIAGNOSIS — E870 Hyperosmolality and hypernatremia: Secondary | ICD-10-CM | POA: Diagnosis present

## 2021-12-24 DIAGNOSIS — G9340 Encephalopathy, unspecified: Principal | ICD-10-CM

## 2021-12-24 DIAGNOSIS — Z79899 Other long term (current) drug therapy: Secondary | ICD-10-CM

## 2021-12-24 DIAGNOSIS — E785 Hyperlipidemia, unspecified: Secondary | ICD-10-CM | POA: Diagnosis present

## 2021-12-24 DIAGNOSIS — I950 Idiopathic hypotension: Secondary | ICD-10-CM | POA: Diagnosis not present

## 2021-12-24 DIAGNOSIS — N183 Chronic kidney disease, stage 3 unspecified: Secondary | ICD-10-CM | POA: Diagnosis not present

## 2021-12-24 DIAGNOSIS — J9811 Atelectasis: Secondary | ICD-10-CM | POA: Diagnosis present

## 2021-12-24 DIAGNOSIS — R29722 NIHSS score 22: Secondary | ICD-10-CM | POA: Diagnosis present

## 2021-12-24 DIAGNOSIS — Z833 Family history of diabetes mellitus: Secondary | ICD-10-CM

## 2021-12-24 DIAGNOSIS — Z823 Family history of stroke: Secondary | ICD-10-CM

## 2021-12-24 DIAGNOSIS — R400 Somnolence: Secondary | ICD-10-CM | POA: Diagnosis not present

## 2021-12-24 DIAGNOSIS — I472 Ventricular tachycardia, unspecified: Secondary | ICD-10-CM | POA: Diagnosis not present

## 2021-12-24 DIAGNOSIS — R1310 Dysphagia, unspecified: Secondary | ICD-10-CM | POA: Diagnosis present

## 2021-12-24 DIAGNOSIS — Z79890 Hormone replacement therapy (postmenopausal): Secondary | ICD-10-CM

## 2021-12-24 DIAGNOSIS — G9341 Metabolic encephalopathy: Secondary | ICD-10-CM | POA: Diagnosis present

## 2021-12-24 DIAGNOSIS — Z8249 Family history of ischemic heart disease and other diseases of the circulatory system: Secondary | ICD-10-CM

## 2021-12-24 DIAGNOSIS — I129 Hypertensive chronic kidney disease with stage 1 through stage 4 chronic kidney disease, or unspecified chronic kidney disease: Secondary | ICD-10-CM | POA: Diagnosis present

## 2021-12-24 DIAGNOSIS — H538 Other visual disturbances: Secondary | ICD-10-CM | POA: Diagnosis present

## 2021-12-24 DIAGNOSIS — Z66 Do not resuscitate: Secondary | ICD-10-CM | POA: Diagnosis present

## 2021-12-24 DIAGNOSIS — E039 Hypothyroidism, unspecified: Secondary | ICD-10-CM | POA: Diagnosis present

## 2021-12-24 DIAGNOSIS — J189 Pneumonia, unspecified organism: Secondary | ICD-10-CM | POA: Diagnosis present

## 2021-12-24 DIAGNOSIS — I4811 Longstanding persistent atrial fibrillation: Secondary | ICD-10-CM | POA: Diagnosis not present

## 2021-12-24 DIAGNOSIS — R32 Unspecified urinary incontinence: Secondary | ICD-10-CM | POA: Diagnosis present

## 2021-12-24 DIAGNOSIS — N1832 Chronic kidney disease, stage 3b: Secondary | ICD-10-CM | POA: Diagnosis present

## 2021-12-24 DIAGNOSIS — R410 Disorientation, unspecified: Secondary | ICD-10-CM | POA: Diagnosis not present

## 2021-12-24 DIAGNOSIS — N189 Chronic kidney disease, unspecified: Secondary | ICD-10-CM | POA: Diagnosis not present

## 2021-12-24 DIAGNOSIS — E860 Dehydration: Secondary | ICD-10-CM | POA: Diagnosis present

## 2021-12-24 DIAGNOSIS — K5520 Angiodysplasia of colon without hemorrhage: Secondary | ICD-10-CM | POA: Diagnosis present

## 2021-12-24 DIAGNOSIS — E1122 Type 2 diabetes mellitus with diabetic chronic kidney disease: Secondary | ICD-10-CM | POA: Diagnosis present

## 2021-12-24 DIAGNOSIS — R159 Full incontinence of feces: Secondary | ICD-10-CM | POA: Diagnosis present

## 2021-12-24 DIAGNOSIS — D500 Iron deficiency anemia secondary to blood loss (chronic): Secondary | ICD-10-CM | POA: Diagnosis not present

## 2021-12-24 DIAGNOSIS — Z8673 Personal history of transient ischemic attack (TIA), and cerebral infarction without residual deficits: Secondary | ICD-10-CM | POA: Diagnosis not present

## 2021-12-24 DIAGNOSIS — R609 Edema, unspecified: Secondary | ICD-10-CM | POA: Diagnosis not present

## 2021-12-24 DIAGNOSIS — I480 Paroxysmal atrial fibrillation: Secondary | ICD-10-CM | POA: Diagnosis present

## 2021-12-24 DIAGNOSIS — K922 Gastrointestinal hemorrhage, unspecified: Secondary | ICD-10-CM

## 2021-12-24 DIAGNOSIS — R4701 Aphasia: Secondary | ICD-10-CM | POA: Diagnosis present

## 2021-12-24 DIAGNOSIS — R29810 Facial weakness: Secondary | ICD-10-CM | POA: Diagnosis present

## 2021-12-24 DIAGNOSIS — Z91041 Radiographic dye allergy status: Secondary | ICD-10-CM

## 2021-12-24 DIAGNOSIS — E440 Moderate protein-calorie malnutrition: Secondary | ICD-10-CM | POA: Diagnosis present

## 2021-12-24 DIAGNOSIS — H409 Unspecified glaucoma: Secondary | ICD-10-CM | POA: Diagnosis present

## 2021-12-24 DIAGNOSIS — R627 Adult failure to thrive: Secondary | ICD-10-CM | POA: Diagnosis present

## 2021-12-24 DIAGNOSIS — J690 Pneumonitis due to inhalation of food and vomit: Secondary | ICD-10-CM | POA: Diagnosis present

## 2021-12-24 DIAGNOSIS — Z7189 Other specified counseling: Secondary | ICD-10-CM | POA: Diagnosis not present

## 2021-12-24 DIAGNOSIS — I10 Essential (primary) hypertension: Secondary | ICD-10-CM | POA: Diagnosis not present

## 2021-12-24 DIAGNOSIS — Z888 Allergy status to other drugs, medicaments and biological substances status: Secondary | ICD-10-CM

## 2021-12-24 DIAGNOSIS — I4729 Other ventricular tachycardia: Secondary | ICD-10-CM | POA: Diagnosis not present

## 2021-12-24 DIAGNOSIS — I639 Cerebral infarction, unspecified: Secondary | ICD-10-CM | POA: Diagnosis not present

## 2021-12-24 DIAGNOSIS — Z515 Encounter for palliative care: Secondary | ICD-10-CM | POA: Diagnosis not present

## 2021-12-24 DIAGNOSIS — N184 Chronic kidney disease, stage 4 (severe): Secondary | ICD-10-CM | POA: Diagnosis not present

## 2021-12-24 DIAGNOSIS — E875 Hyperkalemia: Secondary | ICD-10-CM | POA: Diagnosis not present

## 2021-12-24 DIAGNOSIS — I69320 Aphasia following cerebral infarction: Secondary | ICD-10-CM | POA: Diagnosis not present

## 2021-12-24 DIAGNOSIS — Z7401 Bed confinement status: Secondary | ICD-10-CM

## 2021-12-24 LAB — URINALYSIS, ROUTINE W REFLEX MICROSCOPIC
Bilirubin Urine: NEGATIVE
Glucose, UA: NEGATIVE mg/dL
Hgb urine dipstick: NEGATIVE
Ketones, ur: NEGATIVE mg/dL
Leukocytes,Ua: NEGATIVE
Nitrite: NEGATIVE
Protein, ur: 100 mg/dL — AB
Specific Gravity, Urine: 1.02 (ref 1.005–1.030)
pH: 5.5 (ref 5.0–8.0)

## 2021-12-24 LAB — URINALYSIS, MICROSCOPIC (REFLEX)

## 2021-12-24 LAB — CBC
HCT: 35.9 % — ABNORMAL LOW (ref 36.0–46.0)
Hemoglobin: 12.1 g/dL (ref 12.0–15.0)
MCH: 27.9 pg (ref 26.0–34.0)
MCHC: 33.7 g/dL (ref 30.0–36.0)
MCV: 82.7 fL (ref 80.0–100.0)
Platelets: 313 10*3/uL (ref 150–400)
RBC: 4.34 MIL/uL (ref 3.87–5.11)
RDW: 14 % (ref 11.5–15.5)
WBC: 16 10*3/uL — ABNORMAL HIGH (ref 4.0–10.5)
nRBC: 0 % (ref 0.0–0.2)

## 2021-12-24 LAB — COMPREHENSIVE METABOLIC PANEL
ALT: 11 U/L (ref 0–44)
AST: 25 U/L (ref 15–41)
Albumin: 3.6 g/dL (ref 3.5–5.0)
Alkaline Phosphatase: 56 U/L (ref 38–126)
Anion gap: 10 (ref 5–15)
BUN: 27 mg/dL — ABNORMAL HIGH (ref 8–23)
CO2: 23 mmol/L (ref 22–32)
Calcium: 9.1 mg/dL (ref 8.9–10.3)
Chloride: 111 mmol/L (ref 98–111)
Creatinine, Ser: 2.33 mg/dL — ABNORMAL HIGH (ref 0.44–1.00)
GFR, Estimated: 20 mL/min — ABNORMAL LOW (ref >60)
Glucose, Bld: 154 mg/dL — ABNORMAL HIGH (ref 70–99)
Potassium: 4.5 mmol/L (ref 3.5–5.1)
Sodium: 144 mmol/L (ref 135–145)
Total Bilirubin: 0.8 mg/dL (ref 0.3–1.2)
Total Protein: 7.1 g/dL (ref 6.5–8.1)

## 2021-12-24 LAB — DIFFERENTIAL
Abs Immature Granulocytes: 0.08 10*3/uL — ABNORMAL HIGH (ref 0.00–0.07)
Basophils Absolute: 0 10*3/uL (ref 0.0–0.1)
Basophils Relative: 0 %
Eosinophils Absolute: 0 10*3/uL (ref 0.0–0.5)
Eosinophils Relative: 0 %
Immature Granulocytes: 1 %
Lymphocytes Relative: 16 %
Lymphs Abs: 2.5 10*3/uL (ref 0.7–4.0)
Monocytes Absolute: 1.2 10*3/uL — ABNORMAL HIGH (ref 0.1–1.0)
Monocytes Relative: 8 %
Neutro Abs: 12.2 10*3/uL — ABNORMAL HIGH (ref 1.7–7.7)
Neutrophils Relative %: 75 %

## 2021-12-24 LAB — I-STAT CHEM 8, ED
BUN: 36 mg/dL — ABNORMAL HIGH (ref 8–23)
Calcium, Ion: 1.07 mmol/L — ABNORMAL LOW (ref 1.15–1.40)
Chloride: 112 mmol/L — ABNORMAL HIGH (ref 98–111)
Creatinine, Ser: 2.4 mg/dL — ABNORMAL HIGH (ref 0.44–1.00)
Glucose, Bld: 161 mg/dL — ABNORMAL HIGH (ref 70–99)
HCT: 36 % (ref 36.0–46.0)
Hemoglobin: 12.2 g/dL (ref 12.0–15.0)
Potassium: 4.5 mmol/L (ref 3.5–5.1)
Sodium: 146 mmol/L — ABNORMAL HIGH (ref 135–145)
TCO2: 25 mmol/L (ref 22–32)

## 2021-12-24 LAB — LACTIC ACID, PLASMA
Lactic Acid, Venous: 1.6 mmol/L (ref 0.5–1.9)
Lactic Acid, Venous: 1.4 mmol/L (ref 0.5–1.9)

## 2021-12-24 LAB — CBG MONITORING, ED: Glucose-Capillary: 154 mg/dL — ABNORMAL HIGH (ref 70–99)

## 2021-12-24 LAB — PROTIME-INR
INR: 1.1 (ref 0.8–1.2)
Prothrombin Time: 14.1 seconds (ref 11.4–15.2)

## 2021-12-24 LAB — RESP PANEL BY RT-PCR (FLU A&B, COVID) ARPGX2
Influenza A by PCR: NEGATIVE
Influenza B by PCR: NEGATIVE
SARS Coronavirus 2 by RT PCR: NEGATIVE

## 2021-12-24 LAB — TROPONIN I (HIGH SENSITIVITY): Troponin I (High Sensitivity): 27 ng/L — ABNORMAL HIGH (ref <18)

## 2021-12-24 LAB — AMMONIA: Ammonia: 23 umol/L (ref 9–35)

## 2021-12-24 LAB — APTT: aPTT: 24 seconds (ref 24–36)

## 2021-12-24 MED ORDER — SODIUM CHLORIDE 0.9% FLUSH: 3.0000 mL | Freq: Once | INTRAVENOUS | Status: DC

## 2021-12-24 MED ORDER — ACETAMINOPHEN 160 MG/5ML PO SOLN: 650.0000 mg | ORAL | Status: DC | PRN

## 2021-12-24 MED ORDER — SODIUM CHLORIDE 0.9 % IV SOLN
500.0000 mg | Freq: Once | INTRAVENOUS | Status: AC
  Administered 2021-12-24: 500 mg via INTRAVENOUS
  Filled 2021-12-24: qty 5

## 2021-12-24 MED ORDER — ONDANSETRON HCL 4 MG/2ML IJ SOLN
INTRAMUSCULAR | Status: AC
  Administered 2021-12-24: 4 mg via INTRAVENOUS
  Filled 2021-12-24: qty 2

## 2021-12-24 MED ORDER — LORAZEPAM 2 MG/ML IJ SOLN: 1.0000 mg | Freq: Once | INTRAMUSCULAR | Status: AC

## 2021-12-24 MED ORDER — ACETAMINOPHEN 325 MG PO TABS: 650.0000 mg | ORAL_TABLET | ORAL | Status: DC | PRN

## 2021-12-24 MED ORDER — HEPARIN SODIUM (PORCINE) 5000 UNIT/ML IJ SOLN
5000.0000 [IU] | Freq: Three times a day (TID) | INTRAMUSCULAR | Status: DC
  Administered 2021-12-24 – 2022-01-03 (×28): 5000 [IU] via SUBCUTANEOUS
  Filled 2021-12-24 (×28): qty 1

## 2021-12-24 MED ORDER — ONDANSETRON HCL 4 MG/2ML IJ SOLN
4.0000 mg | Freq: Once | INTRAMUSCULAR | Status: AC
Start: 2021-12-24 — End: 2021-12-24

## 2021-12-24 MED ORDER — SODIUM CHLORIDE 0.9 % IV SOLN
1.0000 g | Freq: Once | INTRAVENOUS | Status: AC
  Administered 2021-12-24: 1 g via INTRAVENOUS
  Filled 2021-12-24: qty 10

## 2021-12-24 MED ORDER — SODIUM CHLORIDE 0.9 % IV SOLN: INTRAVENOUS | Status: DC

## 2021-12-24 MED ORDER — STROKE: EARLY STAGES OF RECOVERY BOOK
Freq: Once | Status: AC
  Filled 2021-12-24: qty 1

## 2021-12-24 MED ORDER — LEVOTHYROXINE SODIUM 100 MCG/5ML IV SOLN: 50.0000 ug | Freq: Every day | INTRAVENOUS | Status: DC

## 2021-12-24 MED ORDER — SENNOSIDES-DOCUSATE SODIUM 8.6-50 MG PO TABS: 1.0000 | ORAL_TABLET | Freq: Every evening | ORAL | Status: DC | PRN

## 2021-12-24 MED ORDER — ASPIRIN 300 MG RE SUPP
300.0000 mg | Freq: Once | RECTAL | Status: AC
  Administered 2021-12-24: 300 mg via RECTAL
  Filled 2021-12-24: qty 1

## 2021-12-24 MED ORDER — SODIUM CHLORIDE 0.9 % IV BOLUS
1000.0000 mL | Freq: Once | INTRAVENOUS | Status: AC
  Administered 2021-12-24: 1000 mL via INTRAVENOUS

## 2021-12-24 MED ORDER — LORAZEPAM 2 MG/ML IJ SOLN
INTRAMUSCULAR | Status: AC
  Administered 2021-12-24: 1 mg via INTRAVENOUS
  Filled 2021-12-24: qty 1

## 2021-12-24 MED ORDER — ACETAMINOPHEN 650 MG RE SUPP: 650.0000 mg | RECTAL | Status: DC | PRN

## 2021-12-24 MED ORDER — SODIUM CHLORIDE 0.9 % IV SOLN
500.0000 mg | INTRAVENOUS | Status: DC
  Administered 2021-12-25 – 2021-12-27 (×3): 500 mg via INTRAVENOUS
  Filled 2021-12-24 (×3): qty 5

## 2021-12-24 MED ORDER — SODIUM CHLORIDE 0.9 % IV SOLN
2.0000 g | INTRAVENOUS | Status: DC
  Administered 2021-12-25 – 2021-12-28 (×4): 2 g via INTRAVENOUS
  Filled 2021-12-24 (×4): qty 20

## 2021-12-24 NOTE — Progress Notes (Signed)
EEG complete - results pending 

## 2021-12-24 NOTE — Procedures (Signed)
Patient Name: Patricia Dudley  ?MRN: [id number]  ?Epilepsy Attending: Lora Havens  ?Referring Physician/Provider: Rosalin Hawking, MD ?Date: 12/24/2021 ?Duration: 24.02 mins ? ?Patient history: 88 year old female with history of A-fib not on AC due to GI bleeding, GI AVM with history of GI bleeding, hypertension, hyperlipidemia, diabetes, dementia, stroke with right-sided residual, mostly wheelchair and bedbound at home presented to ED for aphasia, altered mental status, lethargy. EEG to evaluate for seizure ? ?Level of alertness: Awake ? ?AEDs during EEG study: None ? ?Technical aspects: This EEG study was done with scalp electrodes positioned according to the 10-20 International system of electrode placement. Electrical activity was acquired at a sampling rate of 500Hz  and reviewed with a high frequency filter of 70Hz  and a low frequency filter of 1Hz . EEG data were recorded continuously and digitally stored.  ? ?Description: The posterior dominant rhythm consists of 8 Hz activity of moderate voltage (25-35 uV) seen predominantly in posterior head regions, symmetric and reactive to eye opening and eye closing. EEG showed continuous generalized 3 to 6 Hz theta-delta slowing. Hyperventilation and photic stimulation were not performed.    ? ?ABNORMALITY ?- Continuous slow, generalized ? ?IMPRESSION: ?This study is suggestive of moderate diffuse encephalopathy, nonspecific etiology. No seizures or epileptiform discharges were seen throughout the recording. ? ?Lora Havens  ? ?

## 2021-12-24 NOTE — ED Triage Notes (Signed)
Patient LKW yesterday at 2000 patient has been deteriorating for pass couple days. WHen familyy went to check on her she was slumped over to the left.  Patient had vomited and sats 88% ?

## 2021-12-24 NOTE — Consult Note (Addendum)
Neurology Consultation ? ?Reason for Consult: Code stroke ?Referring Physician: Dr. Regenia Skeeter  ? ?CC: aphasia, left gaze and slurred speech  ? ?History is obtained from:EMS, medical record and daughter via phone  ? ?HPI: Patricia Dudley is a 88 y.o. female with past medical history of HTN, HLD, A fib not on AC, GI bleed, dementia, CVA, hypothyroidism and DM who presents to [facility] ED via EMS for aphasia, left gaze and slurred speech. Code stroke called by EMS. LKW 2000.Dr. Erlinda Hong spoke with daughter via phone since Saturday she has been less interactive she has been deteriorating and today was found slumped over to the left with little verbal output. Patient had vomited prior to arrival to the ED. NIHSS 22. CTH with no acute abnormality.   ? ? ?LKW: 12/23/2021 @ 2000 ?tpa given?: no, outside window  ?Premorbid modified Rankin scale (mRS):  ?4-Needs assistance to walk and tend to bodily needs ? ? ?ROS:  Unable to obtain due to altered mental status.  ? ?Past Medical History:  ?Diagnosis Date  ? A-fib ([place])   ? CVA (cerebral vascular accident) [facility])   ? Dementia ([place])   ? Diabetes mellitus without complication ([place])   ? GI bleed   ? Hyperlipemia   ? Hypertension   ? Thyroid disease   ? ? ? ?Essential (primary) hypertension and Type 2 diabetes mellitus w/o complications  ? ?Family History  ?Problem Relation Age of Onset  ? Diabetes Mother   ? CVA Mother   ? Hypertension Mother   ? Diabetes Sister   ? Diabetes Brother   ? ? ? ?Social History:  ? reports that she has never smoked. She has never used smokeless tobacco. She reports that she does not drink alcohol and does not use drugs. ? ?Medications ? ?Current Facility-Administered Medications:  ?  sodium chloride flush (NS) 0.9 % injection 3 mL, 3 mL, Intravenous, Once, Messick, Wallis Bamberg, MD ? ?Current Outpatient Medications:  ?  amLODipine (NORVASC) 5 MG tablet, Take 1 tablet (5 mg total) by mouth daily., Disp: 30 tablet, Rfl: 0 ?  atorvastatin (LIPITOR) 10 MG tablet, Take 1  tablet (10 mg total) by mouth daily., Disp: 30 tablet, Rfl: 0 ?  escitalopram (LEXAPRO) 5 MG tablet, Take 5 mg by mouth daily., Disp: , Rfl:  ?  fluticasone (FLONASE) 50 MCG/ACT nasal spray, Place into the nose., Disp: , Rfl:  ?  glipiZIDE (GLUCOTROL XL) 10 MG 24 hr tablet, Take 1 tablet by mouth daily., Disp: , Rfl:  ?  glucose blood (ONE TOUCH ULTRA TEST) test strip, USE TO TEST BLOOD SUGAR TWICE DAILY, Disp: , Rfl:  ?  hydrOXYzine (ATARAX/VISTARIL) 25 MG tablet, Take by mouth., Disp: , Rfl:  ?  iron polysaccharides (NIFEREX) 150 MG capsule, Take 1 capsule (150 mg total) by mouth 2 (two) times daily., Disp: 60 capsule, Rfl: 0 ?  latanoprost (XALATAN) 0.005 % ophthalmic solution, Place 1 drop into both eyes at bedtime., Disp: , Rfl:  ?  levothyroxine (SYNTHROID, LEVOTHROID) 112 MCG tablet, Take 112 mcg by mouth daily., Disp: , Rfl:  ?  lisinopril (PRINIVIL,ZESTRIL) 40 MG tablet, Take 1 tablet (40 mg total) by mouth daily., Disp: 30 tablet, Rfl: 0 ?  ondansetron (ZOFRAN) 4 MG tablet, Take 1 tablet (4 mg total) by mouth every 8 (eight) hours as needed., Disp: 20 tablet, Rfl: 0 ?  ONETOUCH DELICA LANCETS 82N MISC, USE TO TEST BLOOD SUGAR TWICE DAILY, Disp: , Rfl:  ?  pantoprazole (PROTONIX) 40  MG tablet, Take 1 tablet (40 mg total) by mouth 2 (two) times daily before a meal. (Patient not taking: Reported on 06/13/2017), Disp: 60 tablet, Rfl: 0 ? ? ?Exam: ?Current vital signs: ?Wt 59.8 kg   BMI 21.94 kg/m?  ?Vital signs in last 24 hours: ?Weight:  [59.8 kg] 59.8 kg (05/01 1437) ? ?GENERAL: Awake, alert in NAD ?HEENT: - Normocephalic and atraumatic, dry mm ?LUNGS - Clear to auscultation bilaterally with no wheezes ?CV - S1S2 RRR, no m/r/g, equal pulses bilaterally. ?ABDOMEN - Soft, nontender, nondistended with normoactive BS ?Ext: warm, well perfused, intact peripheral pulses, no edema ? ?NEURO:  ?Mental Status: AA&Ox1. Confused, not following commands ?Language: speech is slurred  ?Cranial Nerves: PERRL 22mm/brisk.  EOMI, visual fields with right hemianopia, left facial asymmetry, facial sensation intact, hearing intact, tongue/uvula/soft palate midline, normal sternocleidomastoid and trapezius muscle strength. No evidence of tongue atrophy or fibrillations ?Motor: left arm and left leg 3/5, right arm 4/5, right leg withdraws  ?Tone: is normal and bulk is normal ?Sensation- decreased  ?Coordination unable to assess ?Gait- deferred ? ?NIHSS ?1a Level of Conscious.: 0 ?1b LOC Questions: 2 ?1c LOC Commands: 2 ?2 Best Gaze: 1 ?3 Visual: 2 ?4 Facial Palsy: 1 ?5a Motor Arm - left: 0 ?5b Motor Arm - Right: 1 ?6a Motor Leg - Left: 2 ?6b Motor Leg - Right: 3 ?7 Limb Ataxia: 0 ?8 Sensory: 1 ?9 Best Language: 3 ?10 Dysarthria: 2 ?11 Extinct. and Inatten.: 2 ?TOTAL: 22  ? ?Labs ?I have reviewed labs in epic and the results pertinent to this consultation are: ?WBC 16.0 ?Cr 2.40 ?BUN 36 ?Na 146  ?CBG 154 ? ?Imaging ?I have reviewed the images obtained: ? ?Code stroke CT-head 5/1 ?1. No acute CT finding. Extensive chronic ischemic changes throughout the brain as outlined above. ?2. ASPECTS is 10. ?3. Chronic left sphenoid sinus inflammatory disease ? ?Assessment:  ?Patricia Dudley is a 88 y.o. female with past medical history of HTN, HLD, A fib not on AC, GI bleed, dementia, CVA, hypothyroidism and DM who presents to [facility] ED via EMS for left gaze and slurred speech. Code stroke called by EMS. LKW 2000 ? ?Recommendations: ?- HgbA1c, fasting lipid panel ?- MRI of the brain without contrast ?- Frequent neuro checks ?- Echocardiogram ?- MRA head routine ?- Prophylactic therapy-Antiplatelet med: Aspirin - dose 81mg   ?- Risk factor modification ?- Telemetry monitoring ?- PT consult, OT consult, Speech consult ?- Stroke team to follow  ? ?Beulah Gandy DNP, ACNPC-AG  ? ?ATTENDING NOTE: ?I reviewed above note and agree with the assessment and plan. Pt was seen and examined.  ? ?88 year old female with history of A-fib not on AC due to GI bleeding, GI  AVM with history of GI bleeding, hypertension, hyperlipidemia, diabetes, dementia, stroke with right-sided residual, mostly wheelchair and bedbound at home presented to ED for aphasia, altered mental status, lethargy.  Per daughter over the phone, she was not doing well since Saturday, more lethargic, sluggish, not moving much.  Over the weekend, patient seems further decline but still able to talk.  Today patient was found not talking and EMS was called.  On arrival, she was found to have both left and right sided weakness, not able to answer questions, not follow commands, code stroke activated. ? ?On exam, pt lethargic, able to tell me her first name and then not answer questions but occasionally perseverated to her first name. Able to follow some simple commands like open mouse, stick out  her tongue but in very delayed fashion with psychomotor slowing. Eyes midline, not tracking bilaterally, blinking to visual threat bilaterally but inconsistent.  Seems to have mild left facial droop, tongue midline.  Bilateral upper extremity 4/5 symmetrical, left lower extremity 3+/5, right lower extremity 2/5 on pain stimulation. Sensation, coordination not corporative and gait not tested. ? ?Etiology for patient symptoms not quite clear, more concerning for encephalopathy given her leukocytosis (WBC 16.0) and dehydration (AKI and hypernatremia).  Also concerning for UTI, will check UA, urine culture.  She may have aspiration as she vomited twice with EMS in the ER.  However, given her history of A-fib not on anticoagulation and previous stroke, stroke cannot be ruled out.  She is not a candidate for TNK given outside window, not IR candidate given poor status at baseline with mRS = 4.  Will admit to indomethacin, continue with stroke and encephalopathy work-up, recommend IV fluid.  Agree with antibiotics for UTI and possible aspiration. Put on ASA suppository. Will follow.  ? ?For detailed assessment and plan, please refer  to above as I have made changes wherever appropriate.  ? ?Rosalin Hawking, MD PhD ?Stroke Neurology ?12/24/2021 ?3:43 PM ? ? ?

## 2021-12-24 NOTE — ED Notes (Signed)
MRI arrives to transport- family needed at bedside. Daughters called to return to ED for MRI. Will call MRI when they return. ?

## 2021-12-24 NOTE — Code Documentation (Signed)
Stroke Response Nurse Documentation ?Code Documentation ? ?Patricia Dudley is a 88 y.o. female arriving to [facility]  via [facility] on 12/24/21 with past medical hx of CVA ('18), Afib (on no anticoagulation),dementia. On No antithrombotic. Code stroke was activated by EMS. BP 164/78, CBG 154.  ? ?Patient from home with daughter where she was LKW at 86 on 12/23/21 and now complaining of aphasia, facial droop, left gaze, hemianopia. Per daughter- pt has been deteriorating since Saturday and was "sluggish and less interactive." Family saw her normal before bed last night. When they went into her bedroom this afternoon they found her slumped to the left side and she had vomited. She was nonverbal. At baseline she requires assistance with ambulating, dressing, bathing. Her MRS is 4.   ? ?Stroke team at the bedside on patient arrival. Labs drawn and patient cleared for CT by Dr. Regenia Skeeter. Patient to CT with team. No CTA/CTP d/t elevated Cr. NIHSS 22, see documentation for details and code stroke times. Patient with disoriented, not following commands, left gaze preference , right hemianopia, right facial droop, right arm weakness, right leg weakness, right decreased sensation, Expressive aphasia , dysarthria , and Visual  neglect on exam. The following imaging was completed:  CT Head. Patient is not a candidate for IV Thrombolytic due to outside window for TNK. Patient is not not a candidate for IR due to MRS 4. Patient vomited for a second time when back in ED room. She was placed on a Nonrerbreather when the [place] did not bring her O2 past 88%. Zofran given for the nausea. EDP at bedside along with EMT.  ? ?Care Plan: Q2x12 then Q4 vitals/NIHSS, permissive HTN BP<220/120, MRI/MRA routine.   ? ?Bedside handoff with ED RN Jinny Blossom and EMT Vicente Males.   ? ?Giacomo Valone, Rande Brunt  ?Stroke Response RN ?  ?

## 2021-12-24 NOTE — ED Provider Notes (Signed)
Normally [facility] EMERGENCY DEPARTMENT Provider Note   CSN: [id number] Arrival date & time: 12/24/21  1430  An emergency department physician performed an initial assessment on this suspected stroke patient at 1432.  History  Chief Complaint  Patient presents with   Code Stroke    Patricia Dudley is a 88 y.o. female.  HPI 88 year old female presents via EMS with altered mental status.  EMS is no longer present when I am seeing the patient.  She was called a code stroke though seems like her last known well was last night.  She has been getting progressively worse for couple days.  Apparently vomited prior to me seeing her.  She was also having sats of around 88% on 6 L so she was changed to a nonrebreather.  History is unavailable from patient.  Daughter at the bedside after the initial work-up and indicates patient has been deteriorating for the last 36 hours or so.  She vomited a couple times at dinner last night.  She has been chronically getting worse from a mental status standpoint but much more worse over the last 24-48 hours.  Can speak to family and is not talking at all currently.  Last time she talked she seemed to have garbled speech.  Home Medications Prior to Admission medications   Medication Sig Start Date End Date Taking? Authorizing Provider  alendronate (FOSAMAX) 70 MG tablet Take 70 mg by mouth once a week. Take with a full glass of water on an empty stomach.   Yes [provider]  amLODipine (NORVASC) 5 MG tablet Take 1 tablet (5 mg total) by mouth daily. 12/21/16  Yes Sudini, Wardell Heath, MD  cholecalciferol (VITAMIN D3) 25 MCG (1000 UNIT) tablet Take 1,000 Units by mouth daily.   Yes [provider]  glipiZIDE (GLUCOTROL XL) 10 MG 24 hr tablet Take 1 tablet by mouth daily. 06/17/19  Yes [provider]  latanoprost (XALATAN) 0.005 % ophthalmic solution Place 1 drop into both eyes at bedtime.   Yes [provider]   levothyroxine (SYNTHROID, LEVOTHROID) 112 MCG tablet Take 112 mcg by mouth as directed. Take 1 tablet Mon-Fri   Yes [provider]  lisinopril (PRINIVIL,ZESTRIL) 40 MG tablet Take 1 tablet (40 mg total) by mouth daily. 07/27/15  Yes Mody, Patricia Pesa, MD  torsemide (DEMADEX) 20 MG tablet Take 20 mg by mouth 2 (two) times daily as needed (swelling). 10/11/21  Yes [provider]  vitamin B-12 (CYANOCOBALAMIN) 100 MCG tablet Take 100 mcg by mouth daily.   Yes [provider]  atorvastatin (LIPITOR) 10 MG tablet Take 1 tablet (10 mg total) by mouth daily. 12/19/16   Milagros Loll, MD  escitalopram (LEXAPRO) 5 MG tablet Take 5 mg by mouth daily. 07/31/21   [provider]  fluticasone (FLONASE) 50 MCG/ACT nasal spray Place into the nose. 12/26/16 12/26/17  [provider]  glucose blood (ONE TOUCH ULTRA TEST) test strip USE TO TEST BLOOD SUGAR TWICE DAILY 12/12/16   [provider]  hydrOXYzine (ATARAX/VISTARIL) 25 MG tablet Take by mouth.    [provider]  iron polysaccharides (NIFEREX) 150 MG capsule Take 1 capsule (150 mg total) by mouth 2 (two) times daily. 05/16/17   Adrian Saran, MD  ondansetron (ZOFRAN) 4 MG tablet Take 1 tablet (4 mg total) by mouth every 8 (eight) hours as needed. 08/16/21   Phineas Semen, MD  [facility] DELICA LANCETS 33G MISC USE TO TEST BLOOD SUGAR TWICE DAILY 12/12/16  [provider]  pantoprazole (PROTONIX) 40 MG tablet Take 1 tablet (40 mg total) by mouth 2 (two) times daily before a meal. Patient not taking: Reported on 06/13/2017 12/20/16   Milagros Loll, MD      Allergies    Ezetimibe and Iodine    Review of Systems   Review of Systems  Unable to perform ROS: Mental status change   Physical Exam Updated Vital Signs BP (!) 147/69   Pulse (!) 49   Temp 98.4 F (36.9 C) (Rectal)   Resp (!) 22   Ht 5\' 5"  (1.651 m)   Wt 59.4 kg   SpO2 100%   BMI 21.80 kg/m  Physical Exam Vitals and nursing  note reviewed.  Constitutional:      Appearance: She is well-developed.  HENT:     Head: Normocephalic and atraumatic.  Eyes:     Pupils: Pupils are equal, round, and reactive to light.  Cardiovascular:     Rate and Rhythm: Normal rate and regular rhythm.     Heart sounds: Normal heart sounds.  Pulmonary:     Effort: Pulmonary effort is normal.     Breath sounds: Rhonchi present.  Abdominal:     General: There is no distension.     Palpations: Abdomen is soft.     Tenderness: There is no abdominal tenderness.  Skin:    General: Skin is warm and dry.  Neurological:     Mental Status: She is alert.     Comments: Patient is awake and seems to acknowledge that I am talking to her but does not speak.  She does weakly move all 4 extremities and appears symmetric.    ED Results / Procedures / Treatments   Labs (all labs ordered are listed, but only abnormal results are displayed) Labs Reviewed  CBC - Abnormal; Notable for the following components:      Result Value   WBC 16.0 (*)    HCT 35.9 (*)    All other components within normal limits  DIFFERENTIAL - Abnormal; Notable for the following components:   Neutro Abs 12.2 (*)    Monocytes Absolute 1.2 (*)    Abs Immature Granulocytes 0.08 (*)    All other components within normal limits  COMPREHENSIVE METABOLIC PANEL - Abnormal; Notable for the following components:   Glucose, Bld 154 (*)    BUN 27 (*)    Creatinine, Ser 2.33 (*)    GFR, Estimated 20 (*)    All other components within normal limits  URINALYSIS, ROUTINE W REFLEX MICROSCOPIC - Abnormal; Notable for the following components:   APPearance CLOUDY (*)    Protein, ur 100 (*)    All other components within normal limits  URINALYSIS, MICROSCOPIC (REFLEX) - Abnormal; Notable for the following components:   Bacteria, UA RARE (*)    All other components within normal limits  CBG MONITORING, ED - Abnormal; Notable for the following components:   Glucose-Capillary 154  (*)    All other components within normal limits  I-STAT CHEM 8, ED - Abnormal; Notable for the following components:   Sodium 146 (*)    Chloride 112 (*)    BUN 36 (*)    Creatinine, Ser 2.40 (*)    Glucose, Bld 161 (*)    Calcium, Ion 1.07 (*)    All other components within normal limits  CULTURE, BLOOD (ROUTINE X 2)  CULTURE, BLOOD (ROUTINE X 2)  RESP PANEL BY RT-PCR (FLU A&B, COVID) ARPGX2  URINE CULTURE  PROTIME-INR  APTT  LACTIC ACID, PLASMA  LACTIC ACID, PLASMA  AMMONIA  CBG MONITORING, ED    EKG None  Radiology DG Chest Portable 1 View  Result Date: 12/24/2021 CLINICAL DATA:  Hypoxia EXAM: PORTABLE CHEST 1 VIEW COMPARISON:  08/16/2021 FINDINGS: Probable skin fold artifact over the right lower chest. Patchy airspace disease at left base. Normal cardiac size. No pneumothorax IMPRESSION: 1. Mild airspace disease at the left base, favor atelectasis. Electronically Signed   By: Jasmine Pang M.D.   On: 12/24/2021 15:25   CT HEAD CODE STROKE WO CONTRAST  Result Date: 12/24/2021 CLINICAL DATA:  Code stroke. Neuro deficit, acute, stroke suspected. Slurred speech. Gaze disturbance. EXAM: CT HEAD WITHOUT CONTRAST TECHNIQUE: Contiguous axial images were obtained from the base of the skull through the vertex without intravenous contrast. RADIATION DOSE REDUCTION: This exam was performed according to the departmental dose-optimization program which includes automated exposure control, adjustment of the mA and/or kV according to patient size and/or use of iterative reconstruction technique. COMPARISON:  MRI 08/30/2021.  CT 06/08/2021. FINDINGS: Brain: Numerous old cerebellar infarctions. Chronic small-vessel ischemic changes affect pons. Old right parieto-occipital cortical and subcortical infarction. Chronic small-vessel ischemic changes of the thalami and basal ganglia. Chronic small-vessel ischemic changes throughout the cerebral hemispheric white matter. Old right parietal vertex cortical  and subcortical infarction. Vascular: There is atherosclerotic calcification of the major vessels at the base of the brain. Skull: Negative Sinuses/Orbits: Chronic inflammatory change of the left division of the sphenoid sinus. Other sinuses are clear. Orbits negative. Other: None ASPECTS (Alberta Stroke Program Early CT Score) - Ganglionic level infarction (caudate, lentiform nuclei, internal capsule, insula, M1-M3 cortex): 7 - Supraganglionic infarction (M4-M6 cortex): 3 Total score (0-10 with 10 being normal): 10 IMPRESSION: 1. No acute CT finding. Extensive chronic ischemic changes throughout the brain as outlined above. 2. ASPECTS is 10. 3. Chronic left sphenoid sinus inflammatory disease. 4. These results were communicated to Dr. Roda Shutters at 2:47 pm on 12/24/2021 by text page via the [facility] messaging system. Electronically Signed   By: Paulina Fusi M.D.   On: 12/24/2021 14:48    Procedures Procedures    Medications Ordered in ED Medications  sodium chloride flush (NS) 0.9 % injection 3 mL (has no administration in time range)  azithromycin (ZITHROMAX) 500 mg in sodium chloride 0.9 % 250 mL IVPB (500 mg Intravenous New Bag/Given 12/24/21 1538)  aspirin suppository 300 mg (has no administration in time range)  0.9 %  sodium chloride infusion (has no administration in time range)  ondansetron (ZOFRAN) injection 4 mg (4 mg Intravenous Given 12/24/21 1502)  cefTRIAXone (ROCEPHIN) 1 g in sodium chloride 0.9 % 100 mL IVPB (0 g Intravenous Stopped 12/24/21 1623)  sodium chloride 0.9 % bolus 1,000 mL (1,000 mLs Intravenous New Bag/Given 12/24/21 1534)    ED Course/ Medical Decision Making/ A&P                           Medical Decision Making Amount and/or Complexity of Data Reviewed Labs: ordered. Radiology: ordered.  Risk Prescription drug management. Decision regarding hospitalization.   Patient with acute altered mental status.  Unclear if this is truly from a stroke but she is well outside the TNK  window.  No focal deficits besides speech problems and is more like she is not talking.  She does follow some commands.  She was quite hypoxic upon my arrival with her being on a  nonrebreather but we were able to wean this down into the nasal cannula of around 3 L/min.  However, x-ray was viewed and there is some atelectasis but with her clinical picture I think this is probably more likely pneumonia or aspiration.  Given IV antibiotics.  CT head images viewed by myself and there is no obvious head bleed.  Neurology will follow and she will need work-up for stroke but no emergent intervention.  Labs show a leukocytosis that is probably reactive but could be indicative of infection.  Overall will need admission to the hospital service and I have consulted Dr. Mahala Menghini.        Final Clinical Impression(s) / ED Diagnoses Final diagnoses:  Acute encephalopathy    Rx / DC Orders ED Discharge Orders     None         Pricilla Loveless, MD 12/24/21 [phone]

## 2021-12-24 NOTE — ED Notes (Signed)
[phone] Daughter ?

## 2021-12-24 NOTE — ED Notes (Signed)
Patient transported to MRI 

## 2021-12-24 NOTE — H&P (Signed)
? ?HPI ? ?Patricia Dudley LHT:[id number] DOB: [birth year] DOA: 12/24/2021 ? ?PCP: Tracie Harrier, MD  ? ?Chief Complaint: Lethargy confusion risk of stroke ? 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 ?Review of systems unobtainable secondary to lethargy and medical condition  ?Discussion with daughter [name]-HCPOA-about 8 to 10 weeks ago her PCP ordered increased home health therapy because of dementia and some failure to thrive that patient has been experiencing over the past year-at baseline she is somewhat coherent is able to function and was doing well with therapy that was ordered and was ambulating a little better  ?she has 10 children-8 girls-she lives with another daughter [name]) she had a fall 4/29 but awoke on 4/30 had breakfast no coughing spells no other issues-she was kind of sleepy after the fall but no loss of consciousness ? ?Awoke a.m. 5/1 altered aphasia and left gaze slurred speech--She had vomited and her sats were in the 88 percentile ? ?Came to ED work-up obtained Tmax 98 4 respirations 22 was on 100% ? ?Review of Systems:  ?Level 5 caveat ? ?Pertinent +'s: ?Pertinent -"s: ? ?ED Course: started antibiotics, fluids, aspirin suppository 300 given ?Neurology consulted who ordered separate orders per their note ? ? ?Past Medical History:  ?Diagnosis Date  ? A-fib ([place])   ? CVA (cerebral vascular accident) [facility])   ? Dementia ([place])   ? Diabetes mellitus without complication ([place])   ? GI bleed   ? Hyperlipemia   ? Hypertension   ? Thyroid disease   ? ?Past Surgical History:  ?Procedure Laterality Date  ? COLONOSCOPY N/A 05/16/2017  ? Procedure: COLONOSCOPY;  Surgeon: Lin Landsman, MD;  Location: [facility] ENDOSCOPY;  Service: Gastroenterology;  Laterality: N/A;  ? ESOPHAGOGASTRODUODENOSCOPY N/A 08/08/2016  ? Procedure:  ESOPHAGOGASTRODUODENOSCOPY (EGD);  Surgeon: [name], MD;  Location: [facility] ENDOSCOPY;  Service: Endoscopy;  Laterality: N/A;  ? ESOPHAGOGASTRODUODENOSCOPY N/A 05/16/2017  ? Procedure: ESOPHAGOGASTRODUODENOSCOPY (EGD);  Surgeon: Lin Landsman, MD;  Location: [facility] ENDOSCOPY;  Service: Gastroenterology;  Laterality: N/A;  ? ESOPHAGOGASTRODUODENOSCOPY (EGD) WITH PROPOFOL N/A 02/28/2017  ? Procedure: ESOPHAGOGASTRODUODENOSCOPY (EGD) WITH PROPOFOL;  Surgeon: [name], MD;  Location[facility] ENDOSCOPY;  Service: Endoscopy;  Laterality: N/A;  ? EYE SURGERY    ? left  ? ? reports that she has never smoked. She has never used smokeless tobacco. She reports that she does not drink alcohol and does not use drugs. ?Retired Radio producer ?Never smoker drinker ?Currently resides with one of her daughters ?Mobility: Relatively independent until about 1 year ago currently uses walking aid ? ?Allergies  ?Allergen Reactions  ? Ezetimibe Other (See Comments)  ?  Unspecified ?Other reaction(s): Other (see comments) ?Other reaction(s): Other (See Comments) ?Unspecified ?Unspecified  ? Iodine Rash  ? ?Family History  ?Problem Relation Age of Onset  ? Diabetes Mother   ? CVA Mother   ? Hypertension Mother   ? Diabetes Sister   ? Diabetes Brother   ? ?Prior to Admission medications   ?Medication Sig Start Date End Date Taking? Authorizing Provider  ?alendronate (FOSAMAX) 70 MG tablet Take 70 mg by mouth once a week. Take with a full glass of water on an empty stomach.   Yes [provider]  ?amLODipine (NORVASC) 5 MG tablet Take 1 tablet (5 mg total) by mouth daily.  12/21/16  Yes Sudini, Alveta Heimlich, MD  ?cholecalciferol (VITAMIN D3) 25 MCG (1000 UNIT) tablet Take 1,000 Units by mouth daily.   Yes [provider]  ?glipiZIDE (GLUCOTROL XL) 10 MG 24 hr tablet Take 1 tablet by mouth daily. 06/17/19  Yes [provider]  ?latanoprost (XALATAN) 0.005 % ophthalmic solution Place 1 drop into both eyes at bedtime.    Yes [provider]  ?levothyroxine (SYNTHROID, LEVOTHROID) 112 MCG tablet Take 112 mcg by mouth as directed. Take 1 tablet Mon-Fri   Yes [provider]  ?lisinopril (PRINIVIL,ZESTRIL) 40 MG tablet Take 1 tablet (40 mg total) by mouth daily. 07/27/15  Yes [name], MD  ?torsemide (DEMADEX) 20 MG tablet Take 20 mg by mouth 2 (two) times daily as needed (swelling). 10/11/21  Yes [provider]  ?vitamin B-12 (CYANOCOBALAMIN) 100 MCG tablet Take 100 mcg by mouth daily.   Yes [provider]  ?atorvastatin (LIPITOR) 10 MG tablet Take 1 tablet (10 mg total) by mouth daily. ?Patient not taking: Reported on 12/24/2021 12/19/16   Hillary Bow, MD  ?fluticasone (FLONASE) 50 MCG/ACT nasal spray Place into the nose. 12/26/16 12/26/17  [provider]  ?glucose blood test strip USE TO TEST BLOOD SUGAR TWICE DAILY 12/12/16   [provider]  ?iron polysaccharides (NIFEREX) 150 MG capsule Take 1 capsule (150 mg total) by mouth 2 (two) times daily. ?Patient not taking: Reported on 12/24/2021 05/16/17   [name], MD  ?ondansetron (ZOFRAN) 4 MG tablet Take 1 tablet (4 mg total) by mouth every 8 (eight) hours as needed. ?Patient not taking: Reported on 12/24/2021 08/16/21   Nance Pear, MD  ?Jonetta Speak LANCETS 40N MISC USE TO TEST BLOOD SUGAR TWICE DAILY 12/12/16   [provider]  ?pantoprazole (PROTONIX) 40 MG tablet Take 1 tablet (40 mg total) by mouth 2 (two) times daily before a meal. ?Patient not taking: Reported on 06/13/2017 12/20/16   Hillary Bow, MD  ? ? ?Physical Exam: ? ?Vitals:  ? 12/24/21 1501 12/24/21 1600  ?BP:  (!) 147/69  ?Pulse:  (!) 49  ?Resp:  (!) 22  ?Temp: 98.4 ?F (36.9 ?C)   ?SpO2:  100%  ? ? ?Awake but incoherent black female Arcus senilis bilaterally postop changes to right eye (has had an ocular stroke) ?Some right hemineglect-mouth on the right angle seems a little bit drooped ?Mucosa is somewhat moist-she has moderate dentition with  partial denture ?S1-S2 slight tachycardia cannot appreciate murmur ?Mild JVD ?Chest posterior laterally decreased air sounds-poor exam given inability of patient to turn for me to listen ?Abdomen is soft no rebound no guarding  ?Babinski is upgoing--she tracks until midline--withdraws right leg tone is normal bulk is diminished sensory unable to fully appreciate ? ?Turn it off and turn it back on and remove some of your abscess and clean some of your pictures around ?I have personally reviewed following labs and imaging studies ? ?Labs:  ?White count 16 BUNs/creatinine 27/2.3 (BL 1.6) sodium 146 chloride 112 ?Lactic acid 1.6 ?UA showed rare bacteria 100 protein cloudy appearance ? ?Imaging studies:  ?CXR = possible left-sided airspace disease    ? ?Medical tests:  ?EKG independently reviewed: Sinus rhythm PR interval 0.08 ST-T wave elevations V2 through V4 with no reciprocal changes compared to prior EKG performed 09/12/2021 this is a change ? ?Test discussed with performing physician: ?Discussed with ED physician Dr. Verner Chol ? ?Decision to obtain old records:  ?Yes ? ?Review and summation of old records:  ?Yes ? ?  Principal Problem: ?  PNA (pneumonia) ?Active Problems: ?  Cerebral thrombosis with cerebral infarction ? ? ?Assessment/Plan ? 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 lipid panel A1c although unlikely to change overall prognosis ?Appreciate neurology input ?Swallow eval etc. as per stroke order set ?Probable risk of aspiration,?  UTI ?Keep n.p.o. until more awake coherent-not safe to take oral meds at this time given amount of lethargy-we will narrow home meds to only essentials and then if mentation improves can discuss the same ?SLP to assess the patient, continue ceftriaxone and azithromycin at this time-May need to  narrow to Zosyn versus Unasyn ?AIN/ATN 2/2 poor p.o. admission, prep prior to admission ACE, Demadex ?Continue IVF 150 cc/H for the next 14 hours-expect will improve ?Bladder scan every shift ?If no better will

## 2021-12-24 NOTE — ED Notes (Signed)
Family back at bedside. MRI called. ?

## 2021-12-25 ENCOUNTER — Inpatient Hospital Stay (HOSPITAL_COMMUNITY): Payer: Medicare PPO

## 2021-12-25 DIAGNOSIS — J189 Pneumonia, unspecified organism: Secondary | ICD-10-CM | POA: Diagnosis not present

## 2021-12-25 DIAGNOSIS — I6389 Other cerebral infarction: Secondary | ICD-10-CM | POA: Diagnosis not present

## 2021-12-25 DIAGNOSIS — Z8673 Personal history of transient ischemic attack (TIA), and cerebral infarction without residual deficits: Secondary | ICD-10-CM

## 2021-12-25 DIAGNOSIS — G9340 Encephalopathy, unspecified: Secondary | ICD-10-CM | POA: Diagnosis not present

## 2021-12-25 DIAGNOSIS — I6330 Cerebral infarction due to thrombosis of unspecified cerebral artery: Secondary | ICD-10-CM

## 2021-12-25 LAB — ECHOCARDIOGRAM COMPLETE
AR max vel: 1.87 cm2
AV Area VTI: 1.59 cm2
AV Area mean vel: 1.8 cm2
AV Mean grad: 7 mmHg
AV Peak grad: 12.5 mmHg
Ao pk vel: 1.77 m/s
Area-P 1/2: 5.54 cm2
Calc EF: 59.6 %
Height: 65 in
MV VTI: 2.04 cm2
S' Lateral: 2 cm
Single Plane A2C EF: 61.8 %
Single Plane A4C EF: 55.4 %
Weight: 2096 oz

## 2021-12-25 LAB — LIPID PANEL
Cholesterol: 185 mg/dL (ref 0–200)
HDL: 52 mg/dL (ref >40)
LDL Cholesterol: 110 mg/dL — ABNORMAL HIGH (ref 0–99)
Total CHOL/HDL Ratio: 3.6 RATIO
Triglycerides: 117 mg/dL (ref <150)
VLDL: 23 mg/dL (ref 0–40)

## 2021-12-25 LAB — URINE CULTURE: Culture: NO GROWTH

## 2021-12-25 LAB — BASIC METABOLIC PANEL
Anion gap: 5 (ref 5–15)
BUN: 21 mg/dL (ref 8–23)
CO2: 24 mmol/L (ref 22–32)
Calcium: 8.2 mg/dL — ABNORMAL LOW (ref 8.9–10.3)
Chloride: 120 mmol/L — ABNORMAL HIGH (ref 98–111)
Creatinine, Ser: 2.05 mg/dL — ABNORMAL HIGH (ref 0.44–1.00)
GFR, Estimated: 23 mL/min — ABNORMAL LOW (ref >60)
Glucose, Bld: 129 mg/dL — ABNORMAL HIGH (ref 70–99)
Potassium: 4.2 mmol/L (ref 3.5–5.1)
Sodium: 149 mmol/L — ABNORMAL HIGH (ref 135–145)

## 2021-12-25 LAB — CBC
HCT: 32 % — ABNORMAL LOW (ref 36.0–46.0)
Hemoglobin: 10.5 g/dL — ABNORMAL LOW (ref 12.0–15.0)
MCH: 27.8 pg (ref 26.0–34.0)
MCHC: 32.8 g/dL (ref 30.0–36.0)
MCV: 84.7 fL (ref 80.0–100.0)
Platelets: 251 10*3/uL (ref 150–400)
RBC: 3.78 MIL/uL — ABNORMAL LOW (ref 3.87–5.11)
RDW: 14.3 % (ref 11.5–15.5)
WBC: 18.4 10*3/uL — ABNORMAL HIGH (ref 4.0–10.5)
nRBC: 0 % (ref 0.0–0.2)

## 2021-12-25 LAB — HEMOGLOBIN A1C
Hgb A1c MFr Bld: 6.5 % — ABNORMAL HIGH (ref 4.8–5.6)
Mean Plasma Glucose: 139.85 mg/dL

## 2021-12-25 LAB — CBG MONITORING, ED
Glucose-Capillary: 119 mg/dL — ABNORMAL HIGH (ref 70–99)
Glucose-Capillary: 148 mg/dL — ABNORMAL HIGH (ref 70–99)

## 2021-12-25 LAB — GLUCOSE, CAPILLARY: Glucose-Capillary: 126 mg/dL — ABNORMAL HIGH (ref 70–99)

## 2021-12-25 LAB — STREP PNEUMONIAE URINARY ANTIGEN: Strep Pneumo Urinary Antigen: NEGATIVE

## 2021-12-25 MED ORDER — DEXTROSE IN LACTATED RINGERS 5 % IV SOLN: INTRAVENOUS | Status: DC

## 2021-12-25 MED ORDER — ASPIRIN EC 81 MG PO TBEC
81.0000 mg | DELAYED_RELEASE_TABLET | Freq: Every day | ORAL | Status: DC
  Administered 2021-12-25: 81 mg via ORAL
  Filled 2021-12-25 (×2): qty 1

## 2021-12-25 MED ORDER — ROSUVASTATIN CALCIUM 5 MG PO TABS
10.0000 mg | ORAL_TABLET | Freq: Every day | ORAL | Status: DC
  Administered 2021-12-26 – 2022-01-03 (×5): 10 mg via ORAL
  Filled 2021-12-25 (×10): qty 2

## 2021-12-25 MED ORDER — ENSURE ENLIVE PO LIQD
237.0000 mL | Freq: Two times a day (BID) | ORAL | Status: DC
  Administered 2021-12-26 – 2022-01-03 (×12): 237 mL via ORAL

## 2021-12-25 NOTE — Evaluation (Signed)
Physical Therapy Evaluation ?Patient Details ?Name: Patricia Dudley ?MRN: [id number] ?DOB: Jan 24, 1930 ?Today's Date: 12/25/2021 ? ?History of Present Illness ? Pt is a 88 y/o female presenting on 5/1 with aphasia, L gaze and slurred speech. MRI 5/1 with "positive for acute small vessel infarct at the left internal capsule". CXR 5/1: "Mild airspace disease at the left base, favor atelectasis."PMH includes: afib, CVA, dementia, DM, HTN, GI bleed.  ?Clinical Impression ? Pt was seen for mobility on bed with sitting balance control, including reduction in tendency to push to L side with RUE.  Pt is able to actively move both legs, though she is struggling with control of the movement due to suspected vision and attention changes.  Pt is recommended to progress to CIR if possible for rehab, with daughter in agreement to try for this.  Pt is going to be admitted, and will need to be on a lower height surface with two person help to safely attempt to stand and take steps.  Follow acutely for poc as outlined below, and will leave pt at 4x per week unless CIR is not approved.  Default to SNF if this is not possible, and will adjust her schedule accordingly. ?   ? ?Recommendations for follow up therapy are one component of a multi-disciplinary discharge planning process, led by the attending physician.  Recommendations may be updated based on patient status, additional functional criteria and insurance authorization. ? ?Follow Up Recommendations Acute inpatient rehab (3hours/day) ? ?  ?Assistance Recommended at Discharge Frequent or constant Supervision/Assistance  ?Patient can return home with the following ? Two people to help with walking and/or transfers;A lot of help with bathing/dressing/bathroom;Assistance with cooking/housework;Assistance with feeding;Direct supervision/assist for medications management;Direct supervision/assist for financial management;Assist for transportation;Help with stairs or ramp for  entrance ? ?  ?Equipment Recommendations None recommended by PT  ?Recommendations for Other Services ? Rehab consult  ?  ?Functional Status Assessment Patient has had a recent decline in their functional status and demonstrates the ability to make significant improvements in function in a reasonable and predictable amount of time.  ? ?  ?Precautions / Restrictions Precautions ?Precautions: Fall ?Restrictions ?Weight Bearing Restrictions: No  ? ?  ? ?Mobility ? Bed Mobility ?Overal bed mobility: Needs Assistance ?Bed Mobility: Supine to Sit, Sit to Supine ?  ?  ?Supine to sit: Total assist ?Sit to supine: Total assist, +2 for physical assistance ?  ?General bed mobility comments: total assist to move legs, pt is not intiating much until finally lifting head a bit upon roll to sit on side of bed ?  ? ?Transfers ?Overall transfer level: Needs assistance ?  ?  ?  ?  ?  ?  ?  ?  ?General transfer comment: did not attempt as pt is too weak to control core for initation of this task ?  ? ?Ambulation/Gait ?  ?  ?  ?  ?  ?  ?  ?General Gait Details: unsafe to attempt ? ?Stairs ?  ?  ?  ?  ?  ? ?Wheelchair Mobility ?  ? ?Modified Rankin (Stroke Patients Only) ?Modified Rankin (Stroke Patients Only) ?Pre-Morbid Rankin Score: Moderate disability ?Modified Rankin: Severe disability ? ?  ? ?Balance Overall balance assessment: Needs assistance ?Sitting-balance support: Bilateral upper extremity supported, Feet supported ?Sitting balance-Leahy Scale: Poor ?  ?  ?  ?  ?  ?  ?  ?  ?  ?  ?  ?  ?  ?  ?  ?  ?   ? ? ? ?  Pertinent Vitals/Pain Pain Assessment ?Pain Assessment: Faces ?Pain Score: 0-No pain  ? ? ?Home Living Family/patient expects to be discharged to:: Private residence ?Living Arrangements: Children ?Available Help at Discharge: Family ?Type of Home: House ?Home Access: Level entry ?  ?  ?  ?Home Layout: One level ?Home Equipment: Rollator (4 wheels);Shower seat;Transport chair;BSC/3in1;Other (comment) (bedrails) ?Additional  Comments: daughter in attendance to give history pt is not able to deliver  ?  ?Prior Function Prior Level of Function : Needs assist;Other (comment) (daughter reports pt walked alone on rollator) ?  ?  ?  ?Physical Assist : Mobility (physical) ?Mobility (physical): Gait;Transfers ?  ?Mobility Comments: HHPT was seeing for Rollator walking, family helps with personal care ?ADLs Comments: pt reports daughter assist her with dressing ?  ? ? ?Hand Dominance  ? Dominant Hand: Right ? ?  ?Extremity/Trunk Assessment  ? Upper Extremity Assessment ?Upper Extremity Assessment: Defer to OT evaluation ?  ? ?Lower Extremity Assessment ?Lower Extremity Assessment: Generalized weakness ?  ? ?Cervical / Trunk Assessment ?Cervical / Trunk Assessment: Kyphotic  ?Communication  ? Communication: Receptive difficulties;Expressive difficulties  ?Cognition Arousal/Alertness: Lethargic ?Behavior During Therapy: Flat affect ?Overall Cognitive Status: History of cognitive impairments - at baseline ?Area of Impairment: Awareness, Following commands, Safety/judgement, Attention ?  ?  ?  ?  ?  ?  ?  ?  ?  ?Current Attention Level: Selective ?Memory: Decreased short-term memory ?Following Commands: Follows one step commands inconsistently, Follows one step commands with increased time ?Safety/Judgement: Decreased awareness of safety, Decreased awareness of deficits ?Awareness: Intellectual ?Problem Solving: Slow processing, Decreased initiation, Difficulty sequencing, Requires verbal cues, Requires tactile cues ?General Comments: with mobility quicly fatigues and then is tiring with pushing over to L side with RUE ?  ?  ? ?  ?General Comments General comments (skin integrity, edema, etc.): pt is unable to reliably control midline, and had two episodes of HR elevation up to 154, then 162 briefly during session ? ?  ?Exercises    ? ?Assessment/Plan  ?  ?PT Assessment Patient needs continued PT services  ?PT Problem List Decreased  strength;Decreased activity tolerance;Decreased balance;Decreased mobility;Decreased coordination;Cardiopulmonary status limiting activity;Decreased safety awareness ? ?   ?  ?PT Treatment Interventions DME instruction;Functional mobility training;Therapeutic activities;Therapeutic exercise;Balance training;Neuromuscular re-education;Patient/family education;Gait training   ? ?PT Goals (Current goals can be found in the Care Plan section)  ?Acute Rehab PT Goals ?Patient Stated Goal: none stated, fairly non verbal ?PT Goal Formulation: With family ?Time For Goal Achievement: 01/08/22 ?Potential to Achieve Goals: Good ? ?  ?Frequency Min 4X/week ?  ? ? ?Co-evaluation   ?  ?  ?  ?  ? ? ?  ?AM-PAC PT "6 Clicks" Mobility  ?Outcome Measure Help needed turning from your back to your side while in a flat bed without using bedrails?: A Lot ?Help needed moving from lying on your back to sitting on the side of a flat bed without using bedrails?: A Lot ?Help needed moving to and from a bed to a chair (including a wheelchair)?: A Lot ?Help needed standing up from a chair using your arms (e.g., wheelchair or bedside chair)?: Total ?Help needed to walk in hospital room?: Total ?Help needed climbing 3-5 steps with a railing? : Total ?6 Click Score: 9 ? ?  ?End of Session   ?Activity Tolerance: Patient limited by fatigue;Treatment limited secondary to medical complications (Comment);Patient limited by lethargy ?Patient left: in bed;with call bell/phone within reach;with family/visitor present ?Nurse Communication: Mobility  status ?PT Visit Diagnosis: Muscle weakness (generalized) (M62.81);Other abnormalities of gait and mobility (R26.89);Hemiplegia and hemiparesis ?Hemiplegia - Right/Left: Right ?Hemiplegia - dominant/non-dominant: Dominant ?Hemiplegia - caused by: Cerebral infarction ?  ? ?Time: 5093-2671 ?PT Time Calculation (min) (ACUTE ONLY): 28 min ? ? ?Charges:   PT Evaluation ?$PT Eval Moderate Complexity: 1 Mod ?PT  Treatments ?$Therapeutic Activity: 8-22 mins ?  ?   ? ?Ramond Dial ?12/25/2021, 1:56 PM ? ?Mee Hives, PT PhD ?Acute Rehab Dept. Number: [facility] [phone] and [place] [phone] ? ?

## 2021-12-25 NOTE — Evaluation (Signed)
Occupational Therapy Evaluation ?Patient Details ?Name: Patricia Dudley ?MRN: [id number] ?DOB: Apr 02, 1930 ?Today's Date: 12/25/2021 ? ? ?History of Present Illness Pt is a 88 y/o female presenting on 5/1 with aphasia, L gaze and slurred speech. MRI 5/1 with "positive for acute small vessel infarct at the left internal capsule". CXR 5/1: "Mild airspace disease at the left base, favor atelectasis."PMH includes: afib, CVA, dementia, DM, HTN, GI bleed.  ? ?Clinical Impression ?  ?Patient admitted for above and presents with problem list below, including impaired cognition, lethargy, generalized weakness, impaired vision, and decreased activity tolerance.  Pt able to state her first name, select place when given 2 choices but otherwise disoriented; she requires increased time for processing with poor initiation, attention and sequencing. She follows simple commands inconsistently with multimodal cueing, for example: "hold my hand", smile. Patient requires total assist to transition into long sitting with heavy posterior lean, requires total assist for all self care at this time.  Pt poor historian, but responds "yes" to living with family and to daughter assisting with dressing.  Based on performance today, pt will benefit from further OT services acutely and after dc at SNF level to decrease burden of care and optimize return to PLOF.  Will follow.  ?   ? ?Recommendations for follow up therapy are one component of a multi-disciplinary discharge planning process, led by the attending physician.  Recommendations may be updated based on patient status, additional functional criteria and insurance authorization.  ? ?Follow Up Recommendations ? Skilled nursing-short term rehab (<3 hours/day)  ?  ?Assistance Recommended at Discharge Frequent or constant Supervision/Assistance  ?Patient can return home with the following Two people to help with walking and/or transfers;Two people to help with  bathing/dressing/bathroom;Assistance with cooking/housework;Assistance with feeding;Direct supervision/assist for financial management;Direct supervision/assist for medications management;Assist for transportation;Help with stairs or ramp for entrance ? ?  ?Functional Status Assessment ? Patient has had a recent decline in their functional status and demonstrates the ability to make significant improvements in function in a reasonable and predictable amount of time.  ?Equipment Recommendations ? Other (comment) (defer)  ?  ?Recommendations for Other Services PT consult;Speech consult ? ? ?  ?Precautions / Restrictions Precautions ?Precautions: Fall ?Restrictions ?Weight Bearing Restrictions: No  ? ?  ? ?Mobility Bed Mobility ?Overal bed mobility: Needs Assistance ?Bed Mobility: Supine to Sit ?  ?  ?Supine to sit: Total assist, HOB elevated ?  ?  ?General bed mobility comments: total assist to transition to long sitting on ED stretcher, total assist to maintain upright position.  Repositonied in bed with total assist. ?  ? ?Transfers ?  ?  ?  ?  ?  ?  ?  ?  ?  ?  ?  ? ?  ?Balance Overall balance assessment: Needs assistance ?Sitting-balance support: No upper extremity supported ?Sitting balance-Leahy Scale: Zero ?Sitting balance - Comments: long sitting in bed with total assist to maintain upright position ?Postural control: Posterior lean ?  ?  ?  ?  ?  ?  ?  ?  ?  ?  ?  ?  ?  ?  ?   ? ?ADL either performed or assessed with clinical judgement  ? ?ADL Overall ADL's : Needs assistance/impaired ?  ?  ?  ?  ?  ?  ?  ?  ?  ?  ?  ?  ?  ?  ?  ?  ?  ?  ?Functional mobility during ADLs: Total assistance ?General  ADL Comments: total assist for all self care at this time  ? ? ? ?Vision   ?Vision Assessment?: Vision impaired- to be further tested in functional context ?Additional Comments: Limited L eye opening, able to scan therapist to L and R but limited assessment due to lethargy.  ?   ?Perception   ?  ?Praxis   ?   ? ?Pertinent Vitals/Pain Pain Assessment ?Pain Assessment: Faces ?Faces Pain Scale: No hurt ?Pain Intervention(s): Monitored during session  ? ? ? ?Hand Dominance   ?  ?Extremity/Trunk Assessment Upper Extremity Assessment ?Upper Extremity Assessment: Generalized weakness;Difficult to assess due to impaired cognition (able to squeeze her hands, AAROM shoulder flexion to 90* limited by cognition) ?  ?Lower Extremity Assessment ?Lower Extremity Assessment: Defer to PT evaluation ?  ?  ?  ?Communication Communication ?Communication: Receptive difficulties;Expressive difficulties ?  ?Cognition Arousal/Alertness: Lethargic ?Behavior During Therapy: Flat affect ?Overall Cognitive Status: History of cognitive impairments - at baseline ?Area of Impairment: Attention, Orientation, Memory, Following commands, Problem solving ?  ?  ?  ?  ?  ?  ?  ?  ?Orientation Level: Disoriented to, Time, Situation ?Current Attention Level: Focused ?Memory: Decreased recall of precautions, Decreased short-term memory ?Following Commands: Follows one step commands inconsistently, Follows one step commands with increased time ?  ?  ?Problem Solving: Slow processing, Decreased initiation, Difficulty sequencing, Requires verbal cues, Requires tactile cues ?General Comments: pt able to voice her name and select correct location out of 2 choices (home vs hospital), she follows simple 1 step commands but requires mulitmodal cueing and is very inconsistent. She has slowed processing and decreased initation. ?  ?  ?General Comments  VSS on RA ? ?  ?Exercises   ?  ?Shoulder Instructions    ? ? ?Home Living Family/patient expects to be discharged to:: Private residence ?Living Arrangements: Children ?Available Help at Discharge: Family ?Type of Home: House ?Home Access: Level entry ?  ?  ?Home Layout: One level ?  ?  ?Bathroom Shower/Tub: Walk-in shower ?  ?Bathroom Toilet: Standard ?  ?  ?  ?  ?Additional Comments: pt unable to reports home setup,  per chart review. ?  ? ?  ?Prior Functioning/Environment Prior Level of Function : Needs assist;Patient poor historian/Family not available ?  ?  ?  ?  ?  ?  ?Mobility Comments: reports daughter helps her transfer, per chart minimal ambulation with HHPT ?ADLs Comments: pt reports daughter assist her with dressing ?  ? ?  ?  ?OT Problem List: Decreased strength;Decreased activity tolerance;Impaired balance (sitting and/or standing);Decreased cognition;Decreased coordination;Impaired vision/perception;Decreased safety awareness;Decreased knowledge of use of DME or AE;Decreased knowledge of precautions ?  ?   ?OT Treatment/Interventions: Self-care/ADL training;Neuromuscular education;DME and/or AE instruction;Therapeutic activities;Cognitive remediation/compensation;Patient/family education;Balance training  ?  ?OT Goals(Current goals can be found in the care plan section) Acute Rehab OT Goals ?Patient Stated Goal: none stated ?OT Goal Formulation: Patient unable to participate in goal setting ?Time For Goal Achievement: 01/08/22 ?Potential to Achieve Goals: Fair  ?OT Frequency: Min 2X/week ?  ? ?Co-evaluation   ?  ?  ?  ?  ? ?  ?AM-PAC OT "6 Clicks" Daily Activity     ?Outcome Measure Help from another person eating meals?: Total ?Help from another person taking care of personal grooming?: Total ?Help from another person toileting, which includes using toliet, bedpan, or urinal?: Total ?Help from another person bathing (including washing, rinsing, drying)?: Total ?Help from another person to put on and  taking off regular upper body clothing?: Total ?Help from another person to put on and taking off regular lower body clothing?: Total ?6 Click Score: 6 ?  ?End of Session Nurse Communication: Mobility status ? ?Activity Tolerance: Patient limited by fatigue ?Patient left: in bed;with call bell/phone within reach ? ?OT Visit Diagnosis: Other abnormalities of gait and mobility (R26.89);Muscle weakness (generalized)  (M62.81);Other symptoms and signs involving cognitive function;Cognitive communication deficit (R41.841) ?Symptoms and signs involving cognitive functions: Cerebral infarction  ?              ?Time: 1101-1117 ?OT Time Calculation (min): 16 min ?Charges

## 2021-12-25 NOTE — Progress Notes (Signed)
?PROGRESS NOTE ? ? ?Patricia Dudley  DJM:[id number] DOB: August 08, 1930 DOA: 12/24/2021 ?PCP: Tracie Harrier, MD  ?Brief Narrative:  ?47 blk fem comm dwelling ?CKD iiiib, hypothyroid ?Prior GI Bleed 04/2017 2/2 AVM duodenum [tx 3 u PRBC] ?PAF chad2vasc2>4 not on AC 2/2 GI bleed ?Glaucaoma ?DM tyii ?Htn ?CVA 12/20/2016 with 3 small infarcts ?  ?Review of systems unobtainable secondary to lethargy and medical condition  ?Discussion with daughter [name]-HCPOA-about 8 to 10 weeks ago her PCP ordered increased home health therapy because of dementia and some failure to thrive that patient has been experiencing over the past year-at baseline she is somewhat coherent is able to function and was doing well with therapy that was ordered and was ambulating a little better  ?awoke on 4/30 had breakfast no coughing spells no other issues-she was kind of sleepy after the fall but no loss of consciousness ? ?Awoke a.m. 5/1 altered aphasia and left gaze slurred speech--She had vomited and her sats were in the 88 percentile ?  ?Admitted with metabolic encephalopathy?  Stroke?  Aspiration ? ?Hospital-Problem based course ? ?Possible recurrent stroke/prior CVA 12/20/2016 with 3 small infarcts ?Underlying A-fib not on anticoagulation secondary to GI bleeds ?MRI over read by Dr. Erlinda Hong shows tiny basal ganglia infarct ?Appreciate neurology input--- no aggressive measures follow consultant input ?Speech has cleared the patient for dysphagia 1 diet with liquids ?Probable risk of aspiration,?  UTI ?Liquids as above ?continue ceftriaxone and azithromycin at this time ?Urine culture 5/1 negative, blood culture negative ?Obtain repeat 2 view CXR in a.m. given slightly rising white count ?Toxic metabolic encephalopathy on admission secondary to AKI ?Mentation has not really improved much but she received some Ativan-hold all oral meds ?Dysphagia diet if she is fully awake-can go to floor and hopefully her mentation will clear ?Ammonia was  negative--would not work-up further ?AIN/ATN 2/2 poor p.o. admission, prep prior to admission ACE, Demadex ?Hypernatremia developing secondary to fluid resuscitation ?Change IVF D5 LR 75 cc/H which will give tonicity but also will decrease the sodium ?Repeat labs in the morning ?Do not think she can keep up adequate free water given her mentation at this time ?Abnormal EKG ?Troponin minimally elevated ST-T wave changes seem to have resolved no overt chest pain ?Follow echo ?lisinopril 40, amlodipine 5 held on admission ?Start as needed metoprolol every 4 as needed blood pressure >222 systolic  ?Prior GI bleed 2018 secondary to AVMs ?Poor candidacy for anticoagulation may be a candidate for antiplatelet-defer to neurology ?Hypothyroid ?Change Synthroid to IV dose 50 mcg ?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 ?DVT prophylaxis: Heparin ?Code Status: DO NOT RESUSCITATE ?Family Communication: Discussed in detail with the patient's daughter at the bedside who has been there since yesterday ?Have explained to the daughter that if the patient does not improve, that this may be a terminal admission-would consult palliative care if she does not more awake and alert in the morning ?Disposition:  ?Status is: Inpatient ?Remains inpatient appropriate because:  ? ?Needs further work-up and hopefully will clear her encephalopathy ?  ?Consultants:  ?Neurology ? ?Procedures: MRI ? ?Antimicrobials: Ceftriaxone azithromycin ending on 5/6 ? ? ?Subjective: ?Still lethargic-might have received some Ativan for MRI ?Daughter still at the bedside in the emergency room ?She has not really  woken up much ? ?I discussed with Dr. Erlinda Hong the plan of care ? ?Objective: ?Vitals:  ? 12/25/21 1145 12/25/21 1200  12/25/21 1307 12/25/21 1315  ?BP: 133/69 (!) 164/69 (!) 170/141 (!) 168/91  ?Pulse: (!) 43 (!) 43 (!) 51 81  ?Resp: 17 18 20  (!) 22  ?Temp:      ?TempSrc:      ?SpO2: 97% 97% 93% 96%  ?Weight:      ?Height:      ? ? ?Intake/Output Summary (Last 24 hours) at 12/25/2021 1357 ?Last data filed at 12/25/2021 4098 ?Gross per 24 hour  ?Intake 1885 ml  ?Output 500 ml  ?Net 1385 ml  ? ?Filed Weights  ? 12/24/21 1437 12/24/21 1453  ?Weight: 59.8 kg 59.4 kg  ? ? ?Examination: ?44 black female no distress very frail ?Scleral icterus absent no pallor arcus senilis present ?Poor dentition ?Neck soft supple ?S1-S2 no murmur slight tachycardic ?Abdomen soft no rebound ?No lower extremity edema ?ROM intact power 5/5 ? ? ?Data Reviewed: personally reviewed  ? ?CBC ?   ?Component Value Date/Time  ? WBC 18.4 (H) 12/25/2021 0530  ? RBC 3.78 (L) 12/25/2021 0530  ? HGB 10.5 (L) 12/25/2021 0530  ? HCT 32.0 (L) 12/25/2021 0530  ? PLT 251 12/25/2021 0530  ? MCV 84.7 12/25/2021 0530  ? MCH 27.8 12/25/2021 0530  ? MCHC 32.8 12/25/2021 0530  ? RDW 14.3 12/25/2021 0530  ? LYMPHSABS 2.5 12/24/2021 1436  ? MONOABS 1.2 (H) 12/24/2021 1436  ? EOSABS 0.0 12/24/2021 1436  ? BASOSABS 0.0 12/24/2021 1436  ? ? ?  Latest Ref Rng & Units 12/25/2021  ?  5:30 AM 12/24/2021  ?  2:38 PM 12/24/2021  ?  2:36 PM  ?CMP  ?Glucose 70 - 99 mg/dL 129   161   154    ?BUN 8 - 23 mg/dL 21   36   27    ?Creatinine 0.44 - 1.00 mg/dL 2.05   2.40   2.33    ?Sodium 135 - 145 mmol/L 149   146   144    ?Potassium 3.5 - 5.1 mmol/L 4.2   4.5   4.5    ?Chloride 98 - 111 mmol/L 120   112   111    ?CO2 22 - 32 mmol/L 24    23    ?Calcium 8.9 - 10.3 mg/dL 8.2    9.1    ?Total Protein 6.5 - 8.1 g/dL   7.1    ?Total Bilirubin 0.3 - 1.2 mg/dL   0.8    ?Alkaline Phos 38 - 126 U/L   56    ?AST 15 - 41 U/L   25    ?ALT 0 - 44 U/L   11    ? ? ? ?Radiology Studies: ?MR ANGIO HEAD WO CONTRAST ? ?Addendum Date: 12/25/2021   ?ADDENDUM REPORT: 12/25/2021 07:51 ADDENDUM: Dr. Erlinda Hong called for second  opinion on diffusion imaging which is positive for acute small vessel infarct at the left internal capsule. Electronically Signed   By: Jorje Guild M.D.   On: 12/25/2021 07:51  ? ?Result Date: 12/25/2021 ?CLINICAL DATA:  Stroke follow-up EXAM: MRI HEAD WITHOUT CONTRAST MRA HEAD WITHOUT CONTRAST TECHNIQUE: Multiplanar, multi-echo pulse sequences of the brain and surrounding structures were acquired without intravenous contrast. Angiographic images of the Circle of Willis were acquired using MRA technique without intravenous contrast. COMPARISON:  No pertinent prior exam. FINDINGS: MRI HEAD FINDINGS Brain: No acute infarct, mass effect or  extra-axial collection. Numerous chronic microhemorrhages in a mixed central and peripheral distribution. Old infarcts of the right occipital lobe and bilateral cerebellar hemispheres. There is confluent hyperintense T2-weighted signal within the white matter. Generalized cerebral volume loss. The midline structures are normal. Vascular: Major flow voids are preserved. Skull and upper cervical spine: Normal calvarium and skull base. Visualized upper cervical spine and soft tissues are normal. Sinuses/Orbits:No paranasal sinus fluid levels or advanced mucosal thickening. No mastoid or middle ear effusion. Normal orbits. MRA HEAD FINDINGS POSTERIOR CIRCULATION: --Vertebral arteries: Normal --Inferior cerebellar arteries: Normal. --Basilar artery: Normal. --Superior cerebellar arteries: Normal. --Posterior cerebral arteries: Normal. ANTERIOR CIRCULATION: --Intracranial internal carotid arteries: Normal. --Anterior cerebral arteries (ACA): Normal. --Middle cerebral arteries (MCA): Normal. ANATOMIC VARIANTS: None IMPRESSION: 1. No acute intracranial abnormality. 2. Old infarcts of the right occipital lobe and bilateral cerebellar hemispheres. Confluent chronic small vessel disease. 3. Numerous chronic microhemorrhages in a mixed central and peripheral distribution. This pattern is most  consistent with chronic hypertensive angiopathy. 4. Normal intracranial MRA. Electronically Signed: By: Ulyses Jarred M.D. On: 12/25/2021 00:14  ? ?MR BRAIN WO CONTRAST ? ?Addendum Date: 12/25/2021   ?ADDENDUM REPORT:

## 2021-12-25 NOTE — Progress Notes (Signed)
STROKE TEAM PROGRESS NOTE  ? ?SUBJECTIVE (INTERVAL HISTORY) ?Her daughter is at the bedside.  Overall her condition is stable.  Patient is already, not answer questions or follow commands.  Moving bilateral upper extremities, repeatedly prompt, withdrawal to lower extremities on pain stimulation.  MRI showed small left BG/internal capsule infarct.  MRA unremarkable. ? ? ?OBJECTIVE ?Temp:  [98.2 ?F (36.8 ?C)-98.7 ?F (37.1 ?C)] 98.7 ?F (37.1 ?C) (05/02 1543) ?Pulse Rate:  [39-109] 91 (05/02 1543) ?Cardiac Rhythm: Atrial fibrillation (05/02 1538) ?Resp:  [15-24] 20 (05/02 1543) ?BP: (118-183)/(53-141) 178/64 (05/02 1543) ?SpO2:  [93 %-99 %] 96 % (05/02 1543) ?Weight:  [56.8 kg] 56.8 kg (05/02 1446) ? ?Recent Labs  ?Lab 12/24/21 ?1433 12/25/21 ?1062 12/25/21 ?6948 12/25/21 ?1515  ?GLUCAP 154* 148* 119* 126*  ? ?Recent Labs  ?Lab 12/24/21 ?1436 12/24/21 ?1438 12/25/21 ?0530  ?NA 144 146* 149*  ?K 4.5 4.5 4.2  ?CL 111 112* 120*  ?CO2 23  --  24  ?GLUCOSE 154* 161* 129*  ?BUN 27* 36* 21  ?CREATININE 2.33* 2.40* 2.05*  ?CALCIUM 9.1  --  8.2*  ? ?Recent Labs  ?Lab 12/24/21 ?1436  ?AST 25  ?ALT 11  ?ALKPHOS 56  ?BILITOT 0.8  ?PROT 7.1  ?ALBUMIN 3.6  ? ?Recent Labs  ?Lab 12/24/21 ?1436 12/24/21 ?1438 12/25/21 ?0530  ?WBC 16.0*  --  18.4*  ?NEUTROABS 12.2*  --   --   ?HGB 12.1 12.2 10.5*  ?HCT 35.9* 36.0 32.0*  ?MCV 82.7  --  84.7  ?PLT 313  --  251  ? ?No results for input(s): CKTOTAL, CKMB, CKMBINDEX, TROPONINI in the last 168 hours. ?Recent Labs  ?  12/24/21 ?1436  ?LABPROT 14.1  ?INR 1.1  ? ?Recent Labs  ?  12/24/21 ?1608  ?COLORURINE YELLOW  ?LABSPEC 1.020  ?PHURINE 5.5  ?GLUCOSEU NEGATIVE  ?HGBUR NEGATIVE  ?BILIRUBINUR NEGATIVE  ?KETONESUR NEGATIVE  ?PROTEINUR 100*  ?NITRITE NEGATIVE  ?LEUKOCYTESUR NEGATIVE  ?  ?   ?Component Value Date/Time  ? CHOL 185 12/25/2021 0530  ? TRIG 117 12/25/2021 0530  ? HDL 52 12/25/2021 0530  ? CHOLHDL 3.6 12/25/2021 0530  ? VLDL 23 12/25/2021 0530  ? LDLCALC 110 (H) 12/25/2021 0530  ? ?Lab  Results  ?Component Value Date  ? HGBA1C 6.5 (H) 12/25/2021  ? ?No results found for: LABOPIA, COCAINSCRNUR, [place], [place], [place], [place]  ?No results for input(s): ETH in the last 168 hours. ? ?I have personally reviewed the radiological images below and agree with the radiology interpretations. ? ?MR ANGIO HEAD WO CONTRAST ? ?Addendum Date: 12/25/2021   ?ADDENDUM REPORT: 12/25/2021 07:51 ADDENDUM: Dr. Erlinda Hong called for second opinion on diffusion imaging which is positive for acute small vessel infarct at the left internal capsule. Electronically Signed   By: Jorje Guild M.D.   On: 12/25/2021 07:51  ? ?Result Date: 12/25/2021 ?CLINICAL DATA:  Stroke follow-up EXAM: MRI HEAD WITHOUT CONTRAST MRA HEAD WITHOUT CONTRAST TECHNIQUE: Multiplanar, multi-echo pulse sequences of the brain and surrounding structures were acquired without intravenous contrast. Angiographic images of the Circle of Willis were acquired using MRA technique without intravenous contrast. COMPARISON:  No pertinent prior exam. FINDINGS: MRI HEAD FINDINGS Brain: No acute infarct, mass effect or extra-axial collection. Numerous chronic microhemorrhages in a mixed central and peripheral distribution. Old infarcts of the right occipital lobe and bilateral cerebellar hemispheres. There is confluent hyperintense T2-weighted signal within the white matter. Generalized cerebral volume loss. The midline structures are normal. Vascular: Major flow voids  are preserved. Skull and upper cervical spine: Normal calvarium and skull base. Visualized upper cervical spine and soft tissues are normal. Sinuses/Orbits:No paranasal sinus fluid levels or advanced mucosal thickening. No mastoid or middle ear effusion. Normal orbits. MRA HEAD FINDINGS POSTERIOR CIRCULATION: --Vertebral arteries: Normal --Inferior cerebellar arteries: Normal. --Basilar artery: Normal. --Superior cerebellar arteries: Normal. --Posterior cerebral arteries: Normal. ANTERIOR CIRCULATION:  --Intracranial internal carotid arteries: Normal. --Anterior cerebral arteries (ACA): Normal. --Middle cerebral arteries (MCA): Normal. ANATOMIC VARIANTS: None IMPRESSION: 1. No acute intracranial abnormality. 2. Old infarcts of the right occipital lobe and bilateral cerebellar hemispheres. Confluent chronic small vessel disease. 3. Numerous chronic microhemorrhages in a mixed central and peripheral distribution. This pattern is most consistent with chronic hypertensive angiopathy. 4. Normal intracranial MRA. Electronically Signed: By: Ulyses Jarred M.D. On: 12/25/2021 00:14  ? ?MR BRAIN WO CONTRAST ? ?Addendum Date: 12/25/2021   ?ADDENDUM REPORT: 12/25/2021 07:51 ADDENDUM: Dr. Erlinda Hong called for second opinion on diffusion imaging which is positive for acute small vessel infarct at the left internal capsule. Electronically Signed   By: Jorje Guild M.D.   On: 12/25/2021 07:51  ? ?Result Date: 12/25/2021 ?CLINICAL DATA:  Stroke follow-up EXAM: MRI HEAD WITHOUT CONTRAST MRA HEAD WITHOUT CONTRAST TECHNIQUE: Multiplanar, multi-echo pulse sequences of the brain and surrounding structures were acquired without intravenous contrast. Angiographic images of the Circle of Willis were acquired using MRA technique without intravenous contrast. COMPARISON:  No pertinent prior exam. FINDINGS: MRI HEAD FINDINGS Brain: No acute infarct, mass effect or extra-axial collection. Numerous chronic microhemorrhages in a mixed central and peripheral distribution. Old infarcts of the right occipital lobe and bilateral cerebellar hemispheres. There is confluent hyperintense T2-weighted signal within the white matter. Generalized cerebral volume loss. The midline structures are normal. Vascular: Major flow voids are preserved. Skull and upper cervical spine: Normal calvarium and skull base. Visualized upper cervical spine and soft tissues are normal. Sinuses/Orbits:No paranasal sinus fluid levels or advanced mucosal thickening. No mastoid or  middle ear effusion. Normal orbits. MRA HEAD FINDINGS POSTERIOR CIRCULATION: --Vertebral arteries: Normal --Inferior cerebellar arteries: Normal. --Basilar artery: Normal. --Superior cerebellar arteries: Normal. --Posterior cerebral arteries: Normal. ANTERIOR CIRCULATION: --Intracranial internal carotid arteries: Normal. --Anterior cerebral arteries (ACA): Normal. --Middle cerebral arteries (MCA): Normal. ANATOMIC VARIANTS: None IMPRESSION: 1. No acute intracranial abnormality. 2. Old infarcts of the right occipital lobe and bilateral cerebellar hemispheres. Confluent chronic small vessel disease. 3. Numerous chronic microhemorrhages in a mixed central and peripheral distribution. This pattern is most consistent with chronic hypertensive angiopathy. 4. Normal intracranial MRA. Electronically Signed: By: Ulyses Jarred M.D. On: 12/25/2021 00:14  ? ?DG Chest Portable 1 View ? ?Result Date: 12/24/2021 ?CLINICAL DATA:  Hypoxia EXAM: PORTABLE CHEST 1 VIEW COMPARISON:  08/16/2021 FINDINGS: Probable skin fold artifact over the right lower chest. Patchy airspace disease at left base. Normal cardiac size. No pneumothorax IMPRESSION: 1. Mild airspace disease at the left base, favor atelectasis. Electronically Signed   By: Donavan Foil M.D.   On: 12/24/2021 15:25  ? ?EEG adult ? ?Result Date: 12/24/2021 ?Lora Havens, MD     12/24/2021  8:43 PM Patient Name: DAFINA SUK MRN: [id number] Epilepsy Attending: Lora Havens Referring Physician/Provider: Rosalin Hawking, MD Date: 12/24/2021 Duration: 24.02 mins Patient history: 88 year old female with history of A-fib not on AC due to GI bleeding, GI AVM with history of GI bleeding, hypertension, hyperlipidemia, diabetes, dementia, stroke with right-sided residual, mostly wheelchair and bedbound at home presented to ED for aphasia, altered mental status, lethargy.  EEG to evaluate for seizure Level of alertness: Awake AEDs during EEG study: None Technical aspects: This EEG study  was done with scalp electrodes positioned according to the 10-20 International system of electrode placement. Electrical activity was acquired at a sampling rate of 500Hz  and reviewed with a high frequency filter of

## 2021-12-25 NOTE — ED Notes (Signed)
Speech at bedside

## 2021-12-25 NOTE — ED Notes (Signed)
Echo at bedside

## 2021-12-25 NOTE — Evaluation (Signed)
Clinical/Bedside Swallow Evaluation ?Patient Details  ?Name: Patricia Dudley ?MRN: [id number] ?Date of Birth: Dec 09, 1929 ? ?Today's Date: 12/25/2021 ?Time: SLP Start Time (ACUTE ONLY): 4970 SLP Stop Time (ACUTE ONLY): 0950 ?SLP Time Calculation (min) (ACUTE ONLY): 15 min ? ?Past Medical History:  ?Past Medical History:  ?Diagnosis Date  ? A-fib ([place])   ? CVA (cerebral vascular accident) [facility])   ? Dementia ([place])   ? Diabetes mellitus without complication ([place])   ? GI bleed   ? Hyperlipemia   ? Hypertension   ? Thyroid disease   ? ?Past Surgical History:  ?Past Surgical History:  ?Procedure Laterality Date  ? COLONOSCOPY N/A 05/16/2017  ? Procedure: COLONOSCOPY;  Surgeon: Lin Landsman, MD;  Location: [facility] ENDOSCOPY;  Service: Gastroenterology;  Laterality: N/A;  ? ESOPHAGOGASTRODUODENOSCOPY N/A 08/08/2016  ? Procedure: ESOPHAGOGASTRODUODENOSCOPY (EGD);  Surgeon: [name], MD;  Location: [facility] ENDOSCOPY;  Service: Endoscopy;  Laterality: N/A;  ? ESOPHAGOGASTRODUODENOSCOPY N/A 05/16/2017  ? Procedure: ESOPHAGOGASTRODUODENOSCOPY (EGD);  Surgeon: Lin Landsman, MD;  Location: [facility] ENDOSCOPY;  Service: Gastroenterology;  Laterality: N/A;  ? ESOPHAGOGASTRODUODENOSCOPY (EGD) WITH PROPOFOL N/A 02/28/2017  ? Procedure: ESOPHAGOGASTRODUODENOSCOPY (EGD) WITH PROPOFOL;  Surgeon: [name], MD;  Location: [facility] ENDOSCOPY;  Service: Endoscopy;  Laterality: N/A;  ? EYE SURGERY    ? left  ? ?HPI:  ?Patricia Dudley is a 88 y.o. female who presented to [facility] ED via EMS for aphasia, left gaze and slurred speech. Pt with recent episondes of vomiting at home and in ED. MRI 5/2: "positive for acute small vessel infarct at the left internal capsule." CXR 5/1: "Mild airspace disease at the left base, favor atelectasis."  Pt  with past medical history of HTN, HLD, A fib not on AC, GI bleed, dementia, CVA, hypothyroidism and DM.  ?  ?Assessment / Plan / Recommendation  ?Clinical Impression ? Pt presents with clinical s/s of  aspiration. Pt was very drowsy during this assessment 2/2 sedative medication given overnight for MRI. Pt did not open eyes throughout eval and was not able to participate in OME. There was cough x1 following trial of puree.  Pt required consistent encouragement and verbal, visual, and tactile cuing to attend to POs.  There was some oral holding of puree, but with cuing, pt was able to transit.  There was mild oral residue.  With ice chip trial pt demonstrated oral response initially, but became sleepy and ice chip was removed from oral cavity by digital sweep.  Pt tolerated thin liquid by spoon and straw.  Multiple swallows noted at times, but no cough or change in vocal quality/breath sounds.  Family at bedside reports pt consumes a regular texture diet, but eats very slowly and have noted some coughing with PO intake, particularly liquids.  ? ?Recommend initiating puree diet with thin liquid when pt is awake/alert.  SLP to reassess for diet advancement and/or need for instrumental when pt is more alert. ? ?SLP Visit Diagnosis: Dysphagia, unspecified (R13.10) ?   ?Aspiration Risk ? Mild aspiration risk  ?  ?Diet Recommendation Thin liquid;Dysphagia 1 (Puree)  ? ?Liquid Administration via: Spoon;Straw ?Medication Administration: Crushed with puree (As tolerated) ?Supervision: Staff to assist with self feeding (1:1 feeding assistance) ?Compensations: Slow rate;Small sips/bites ?Postural Changes: Seated upright at 90 degrees  ?  ?Other  Recommendations Oral Care Recommendations: Oral care BID   ? ?Recommendations for follow up therapy are one component of a multi-disciplinary discharge planning process, led by the attending physician.  Recommendations may  be updated based on patient status, additional functional criteria and insurance authorization. ? ?Follow up Recommendations Follow physician's recommendations for discharge plan and follow up therapies (ST at next level of care at present)  ? ? ?  ?Assistance  Recommended at Discharge Frequent or constant Supervision/Assistance  ?Functional Status Assessment Patient has had a recent decline in their functional status and demonstrates the ability to make significant improvements in function in a reasonable and predictable amount of time.  ?Frequency and Duration min 2x/week  ?2 weeks ?  ?   ? ?Prognosis Prognosis for Safe Diet Advancement: Good  ? ?  ? ?Swallow Study   ?General Date of Onset: 12/24/21 ?HPI: Patricia Dudley is a 89 y.o. female who presented to [facility] ED via EMS for aphasia, left gaze and slurred speech. Pt with recent episondes of vomiting at home and in ED. MRI 5/2: "positive for acute small vessel infarct at the left internal capsule." CXR 5/1: "Mild airspace disease at the left base, favor atelectasis."  Pt  with past medical history of HTN, HLD, A fib not on AC, GI bleed, dementia, CVA, hypothyroidism and DM. ?Type of Study: Bedside Swallow Evaluation ?Previous Swallow Assessment: None ?Diet Prior to this Study: NPO ?Temperature Spikes Noted: No ?History of Recent Intubation: No ?Behavior/Cognition: Lethargic/Drowsy;Doesn't follow directions ?Oral Cavity Assessment: Within Functional Limits ?Oral Cavity - Dentition: Dentures, top;Dentures, bottom (Partials) ?Self-Feeding Abilities: Total assist ?Patient Positioning: Upright in bed ?Baseline Vocal Quality: Not observed ?Volitional Cough: Cognitively unable to elicit ?Volitional Swallow: Unable to elicit  ?  ?Oral/Motor/Sensory Function Overall Oral Motor/Sensory Function:  (Cognitively unable)   ?Ice Chips Ice chips: Impaired ?Oral Phase Impairments: Poor awareness of bolus;Impaired mastication ?Oral Phase Functional Implications: Oral holding   ?Thin Liquid Thin Liquid: Impaired ?Presentation: Cup;Spoon;Straw ?Pharyngeal  Phase Impairments: Multiple swallows  ?  ?Nectar Thick Nectar Thick Liquid: Not tested   ?Honey Thick Honey Thick Liquid: Not tested   ?Puree Puree: Impaired ?Oral Phase Impairments:  Poor awareness of bolus ?Oral Phase Functional Implications: Oral residue ?Pharyngeal Phase Impairments: Cough - Immediate   ?Solid ? ? ?  Solid: Not tested  ? ?  ? ?Patricia Edison E Telly Broberg, MA, CCC-SLP ?Acute Rehabilitation Services ?Office: [phone] ?12/25/2021,10:06 AM ? ? ? ? ?

## 2021-12-25 NOTE — Plan of Care (Incomplete)
On exam, pt lethargic, did not answer questions, intangible words intermittently. Did not follow simple commands today, could be from sedative meds for her MRI last night. Eyes able to track on the right but not crossing midline, blinking to visual threat bilaterally but inconsistent.  Seems to have mild left facial droop, tongue protrusion not cooperative.  Bilateral upper extremity 3/5 symmetrical when forced rising up, BLEs mild withdraw to pain. Sensation, coordination not corporative and gait not tested. ? ?

## 2021-12-25 NOTE — ED Notes (Signed)
Pt resting comfortably at this time. Visible rise and fall of chest noted. Pt appears in NAD.  

## 2021-12-25 NOTE — Plan of Care (Signed)
6:18 PM ?Seen and reassessed-more coherent ?No fever no chills ?Some tachy ?She recognizes her daughter ?She coughs while on puree ? ?Re-start po meds tomorrow if no further worsening of cough and aspiration on po dsy 1 ? ?Discussed with daughter [name] [poa]hopeful for the best, and continue to treat treatable within parameters of her Goals ? ?15 min ? ?Verneita Griffes, MD ?[facility] ?6:20 PM ? ?

## 2021-12-25 NOTE — Progress Notes (Signed)
Carotid duplex bilateral study completed.   Please see CV Proc for preliminary results.   Annamae Shivley, RDMS, RVT  

## 2021-12-25 NOTE — Progress Notes (Signed)
Inpatient Rehab Admissions Coordinator:  ? ?Per therapy recommendations, patient was screened for CIR candidacy by Clemens Catholic, MS, CCC-SLP. At this time, Pt. is not tolerating OOB yet and I don't think she is at a level to tolerate intensity of CIR; however,  Pt. may have potential to progress to becoming a potential CIR candidate, so CIR admissions team will follow and monitor for progress and participation with therapies and place consult order if Pt. appears to be an appropriate candidate. Please contact me with any questions.  ? ?Clemens Catholic, MS, CCC-SLP ?Rehab Admissions Coordinator  ?[phone] (celll) ?[phone] (office) ? ? ?

## 2021-12-26 ENCOUNTER — Inpatient Hospital Stay (HOSPITAL_COMMUNITY): Payer: Medicare PPO

## 2021-12-26 DIAGNOSIS — I6330 Cerebral infarction due to thrombosis of unspecified cerebral artery: Secondary | ICD-10-CM | POA: Diagnosis not present

## 2021-12-26 DIAGNOSIS — J189 Pneumonia, unspecified organism: Secondary | ICD-10-CM | POA: Diagnosis not present

## 2021-12-26 LAB — COMPREHENSIVE METABOLIC PANEL
ALT: 23 U/L (ref 0–44)
AST: 38 U/L (ref 15–41)
Albumin: 2.8 g/dL — ABNORMAL LOW (ref 3.5–5.0)
Alkaline Phosphatase: 49 U/L (ref 38–126)
Anion gap: 9 (ref 5–15)
BUN: 15 mg/dL (ref 8–23)
CO2: 23 mmol/L (ref 22–32)
Calcium: 8.3 mg/dL — ABNORMAL LOW (ref 8.9–10.3)
Chloride: 116 mmol/L — ABNORMAL HIGH (ref 98–111)
Creatinine, Ser: 1.86 mg/dL — ABNORMAL HIGH (ref 0.44–1.00)
GFR, Estimated: 26 mL/min — ABNORMAL LOW (ref >60)
Glucose, Bld: 109 mg/dL — ABNORMAL HIGH (ref 70–99)
Potassium: 3.7 mmol/L (ref 3.5–5.1)
Sodium: 148 mmol/L — ABNORMAL HIGH (ref 135–145)
Total Bilirubin: 0.6 mg/dL (ref 0.3–1.2)
Total Protein: 6.2 g/dL — ABNORMAL LOW (ref 6.5–8.1)

## 2021-12-26 LAB — GLUCOSE, CAPILLARY
Glucose-Capillary: 108 mg/dL — ABNORMAL HIGH (ref 70–99)
Glucose-Capillary: 151 mg/dL — ABNORMAL HIGH (ref 70–99)
Glucose-Capillary: 156 mg/dL — ABNORMAL HIGH (ref 70–99)
Glucose-Capillary: 168 mg/dL — ABNORMAL HIGH (ref 70–99)

## 2021-12-26 LAB — CBC
HCT: 33.2 % — ABNORMAL LOW (ref 36.0–46.0)
Hemoglobin: 10.7 g/dL — ABNORMAL LOW (ref 12.0–15.0)
MCH: 27.2 pg (ref 26.0–34.0)
MCHC: 32.2 g/dL (ref 30.0–36.0)
MCV: 84.3 fL (ref 80.0–100.0)
Platelets: 238 10*3/uL (ref 150–400)
RBC: 3.94 MIL/uL (ref 3.87–5.11)
RDW: 14.4 % (ref 11.5–15.5)
WBC: 11.8 10*3/uL — ABNORMAL HIGH (ref 4.0–10.5)
nRBC: 0 % (ref 0.0–0.2)

## 2021-12-26 MED ORDER — ADULT MULTIVITAMIN W/MINERALS CH
1.0000 | ORAL_TABLET | Freq: Every day | ORAL | Status: DC
  Administered 2021-12-27 – 2022-01-03 (×7): 1 via ORAL
  Filled 2021-12-26 (×8): qty 1

## 2021-12-26 MED ORDER — ASPIRIN EC 81 MG PO TBEC
81.0000 mg | DELAYED_RELEASE_TABLET | Freq: Every day | ORAL | Status: DC
  Administered 2021-12-26 – 2022-01-02 (×8): 81 mg via ORAL
  Filled 2021-12-26 (×8): qty 1

## 2021-12-26 MED ORDER — DEXTROSE-NACL 5-0.45 % IV SOLN: INTRAVENOUS | Status: DC

## 2021-12-26 NOTE — Progress Notes (Signed)
?PROGRESS NOTE ? ? ? Patricia Dudley  YIR:[id number] DOB: 12-Oct-1929 DOA: 12/24/2021 ?PCP: Tracie Harrier, MD  ? ?Chief Complaint  ?Patient presents with  ? Code Stroke  ? ? ?Brief Narrative:  ? 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 ?  ? ?Assessment & Plan: ?  ?Principal Problem: ?  PNA (pneumonia) ?Active Problems: ?  Cerebral thrombosis with cerebral infarction ? ?Acute CVA ?- left BG/PLIC small infarct secondary to small vessel disease source given the patient, however cardioembolic cannot be completely ruled out given history of A-fib not on [facility] ?-MRI brain significant for left basal ganglia/intracapsular small infarct ?-MRA unremarkable ?-Carotid Doppler unremarkable ?-2D echo with EF 60 to 65% ?-LDL at 110-A1c at 6.5 ?-Not on any antithrombotic therapy prior to admission, now on aspirin 81 mg daily, unfortunately she is not a good candidate for dual antiplatelet therapy due to history of severe GI bleed. ?-Recommendation for CIR. ?-Permissive hypertension ? ? ?Underlying A-fib  ?- not on anticoagulation secondary to GI bleeds ?-Started on aspirin due to CVA. ? ?Aspiration of pneumonia ?-Chest x-ray significant for left lung base pneumonia ?-Continue with IV Rocephin and azithromycin ? ?Acute metabolic encephalopathy due to above ?-Followed closely by PT/OT/SLP ? ?Acute kidney injury ?-Due to hypovolemia/prerenal, given dehydration with ACEs/Demadex use ?-Improving with hydration ? ?Hypernatremia  ?-Change fluid to D5 half-normal saline. ? ?Prior GI bleed 2018 secondary to AVMs ?Poor candidacy for anticoagulation may be a candidate for antiplatelet-defer to  neurology ? ?Hypothyroid ?-Continue with Synthroid, she remains on IV, change to p.o. when oral intake is more reliable ? 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 ?Hypertension ?-Continue to hold meds to allow for permissive hypertension ? ?Hyperlipidemia ?-LDL is 110, started on Crestor ? ? ?  ? ?DVT prophylaxis: [place] heparin ?Code Status: DNR ?Family Communication: Daughter at bedside ?Disposition:  ? ?Status is: Inpatient ?Remains inpatient appropriate  ?  ?Consultants:  ?Neurology ? ?Subjective: ? ?Patient unable to provide any reliable complaints, daughter at bedside reports patient had a poor night sleep due to pure wick issues. ? ?Objective: ?Vitals:  ? 12/25/21 2000 12/25/21 2319 12/26/21 0332 12/26/21 0900  ?BP: (!) 143/65 (!) 178/77 (!) 167/88 (!) 145/70  ?Pulse: (!) 103 (!) 127 87 74  ?Resp: 20 18 (!) 24 17  ?Temp: 99.9 ?F (37.7 ?C) 99.2 ?F (37.3 ?C) 98.1 ?F (36.7 ?C)   ?TempSrc: Axillary Axillary Axillary   ?SpO2: 97% 97% 97% 97%  ?Weight:      ?Height:      ? ? ?Intake/Output Summary (Last 24 hours) at 12/26/2021 1034 ?Last data filed at 12/25/2021 2311 ?Gross per 24 hour  ?Intake 250 ml  ?Output 100 ml  ?Net 150 ml  ? ?Filed Weights  ? 12/24/21 1437 12/24/21 1453 12/25/21 1446  ?Weight: 59.8 kg 59.4 kg 56.8 kg  ? ? ?Examination: ? ?Somnolent, confused, frail, chronically ill-appearing ?Symmetrical Chest wall movement, Good air movement bilaterally, CTAB ?RRR,No Gallops,Rubs or new Murmurs, No Parasternal Heave ?+ve B.Sounds, Abd Soft, No tenderness, No rebound - guarding or rigidity. ?No Cyanosis, Clubbing or edema,  No new Rash or bruise   ? ? ? ? ?Data Reviewed: I have personally reviewed following labs and imaging studies ? ?CBC: ?Recent Labs  ?Lab 12/24/21 ?1436 12/24/21 ?1438 12/25/21 ?0530 12/26/21 ?0106  ?WBC 16.0*  --  18.4* 11.8*  ?NEUTROABS 12.2*  --   --   --   ?HGB 12.1 12.2 10.5* 10.7*   ?HCT 35.9* 36.0 32.0* 33.2*  ?MCV 82.7  --  84.7 84.3  ?PLT 313  --  251 238  ? ? ?Basic Metabolic Panel: ?Recent Labs  ?Lab 12/24/21 ?1436 12/24/21 ?1438 12/25/21 ?0530 12/26/21 ?0106  ?NA 144 146* 149* 148*  ?K 4.5 4.5 4.2 3.7  ?CL 111 112* 120* 116*  ?CO2 23  --  24 23  ?GLUCOSE 154* 161* 129* 109*  ?BUN 27* 36* 21 15  ?CREATININE 2.33* 2.40* 2.05* 1.86*  ?CALCIUM 9.1  --  8.2* 8.3*  ? ? ?GFR: ?Estimated Creatinine Clearance: 18.4 mL/min (A) (by C-G formula based on SCr of 1.86 mg/dL (H)). ? ?Liver Function Tests: ?Recent Labs  ?Lab 12/24/21 ?1436 12/26/21 ?0106  ?AST 25 38  ?ALT 11 23  ?ALKPHOS 56 49  ?BILITOT 0.8 0.6  ?PROT 7.1 6.2*  ?ALBUMIN 3.6 2.8*  ? ? ?CBG: ?Recent Labs  ?Lab 12/24/21 ?1433 12/25/21 ?3382 12/25/21 ?5053 12/25/21 ?1515 12/26/21 ?0909  ?GLUCAP 154* 148* 119* 126* 108*  ? ? ? ?Recent Results (from the past 240 hour(s))  ?Resp Panel by RT-PCR (Flu A&B, Covid) Peripheral     Status: None  ? Collection Time: 12/24/21  2:30 PM  ? Specimen: Peripheral; Nasopharyngeal(NP) swabs in vial transport medium  ?Result Value Ref Range Status  ? SARS Coronavirus 2 by RT PCR NEGATIVE NEGATIVE Final  ?  Comment: (NOTE) ?SARS-CoV-2 target nucleic acids are NOT DETECTED. ? ?The SARS-CoV-2 RNA is generally detectable in upper respiratory ?specimens during the acute phase of infection. The lowest ?concentration of SARS-CoV-2 viral copies this assay can detect is ?138 copies/mL. A negative result does not preclude SARS-Cov-2 ?infection and should not be used as the sole basis for treatment or ?other patient management decisions. A negative result may occur with  ?improper specimen collection/handling, submission of specimen other ?than nasopharyngeal swab, presence of viral mutation(s) within the ?areas targeted by this assay, and inadequate number of viral ?copies(<138 copies/mL). A negative result must be combined with ?clinical observations, patient history, and epidemiological ?information. The expected  result is Negative. ? ?Fact Sheet for Patients:  ?EntrepreneurPulse.com.au ? ?Fact Sheet for Healthcare Providers:  ?IncredibleEmployment.be ? ?This test is no t yet approved or cleared by the [place] FDA and  ?has been authorized for detection and/or diagnosis of SARS-CoV-2 by ?FDA under an Emergency Use Authorization (EUA). This EUA will remain  ?in effect (meaning this test can be used) for the duration of the ?COVID-19 declaration under Section 564(b)(1) of the Act, 21 ?U.S.C.section 360bbb-3(b)(1), unless the authorization is terminated  ?or revoked sooner.  ? ? ?  ? Influenza A by PCR NEGATIVE NEGATIVE Final  ? Influenza B by PCR NEGATIVE NEGATIVE Final  ?  Comment: (NOTE) ?The Xpert Xpress SARS-CoV-2/FLU/RSV plus assay is intended as an aid ?in the diagnosis of influenza from Nasopharyngeal swab specimens and ?should not be used as a sole basis for treatment. Nasal washings and ?aspirates are unacceptable for Xpert Xpress SARS-CoV-2/FLU/RSV ?testing. ? ?Fact Sheet for Patients: ?EntrepreneurPulse.com.au ? ?Fact Sheet for Healthcare Providers: ?IncredibleEmployment.be ? ?This test is not yet approved or cleared by the [place]  States FDA and ?has been authorized for detection and/or diagnosis of SARS-CoV-2 by ?FDA under an Emergency Use Authorization (EUA). This EUA will remain ?in effect (meaning this test can be used) for the duration of the ?COVID-19 declaration under Section 564(b)(1) of the Act, 21 U.S.C. ?section 360bbb-3(b)(1), unless the authorization is terminated or ?revoked. ? ?Performed at [facility], [place] [address] [place], [place] ?[address] ?  ?Urine Culture     Status: None  ? Collection Time: 12/24/21  3:07 PM  ? Specimen: Urine, Catheterized  ?Result Value Ref Range Status  ? Specimen Description URINE, CATHETERIZED  Final  ? Special Requests NONE  Final  ? Culture   Final  ?  NO GROWTH ?Performed at [facility], [place] [address] [place],  [address] ?  ? Report Status 12/25/2021 FINAL  Final  ?Blood culture (routine x 2)     Status: None (Preliminary result)  ? Collection Time: 12/24/21  3:13 PM  ? Specimen:

## 2021-12-26 NOTE — Progress Notes (Signed)
Physical Therapy Treatment ?Patient Details ?Name: Patricia Dudley ?MRN: [id number] ?DOB: 1930-05-10 ?Today's Date: 12/26/2021 ? ? ?History of Present Illness Pt is a 88 y/o female presenting on 5/1 with aphasia, L gaze and slurred speech. MRI 5/1 with "positive for acute small vessel infarct at the left internal capsule". CXR 5/1: "Mild airspace disease at the left base, favor atelectasis."PMH includes: afib, CVA, dementia, DM, HTN, GI bleed. ? ?  ?PT Comments  ? ? Patient able to progress OOB to chair this session. Patient continues to be limited by cognitive deficits and limited command following. Patient required min-maxA for sitting balance due to L lateral and posterior lean. Patient requires maxA+2 to stand from EOB and take pivotal steps towards recliner.  Daughter present and supportive. Continue to recommend acute inpatient rehab (AIR) for post-acute therapy needs.  ?  ?Recommendations for follow up therapy are one component of a multi-disciplinary discharge planning process, led by the attending physician.  Recommendations may be updated based on patient status, additional functional criteria and insurance authorization. ? ?Follow Up Recommendations ? Acute inpatient rehab (3hours/day) ?  ?  ?Assistance Recommended at Discharge Frequent or constant Supervision/Assistance  ?Patient can return home with the following Two people to help with walking and/or transfers;A lot of help with bathing/dressing/bathroom;Assistance with cooking/housework;Assistance with feeding;Direct supervision/assist for medications management;Direct supervision/assist for financial management;Assist for transportation;Help with stairs or ramp for entrance ?  ?Equipment Recommendations ? None recommended by PT  ?  ?Recommendations for Other Services   ? ? ?  ?Precautions / Restrictions Precautions ?Precautions: Fall ?Restrictions ?Weight Bearing Restrictions: No  ?  ? ?Mobility ? Bed Mobility ?Overal bed mobility: Needs  Assistance ?Bed Mobility: Supine to Sit ?  ?  ?Supine to sit: Total assist, +2 for physical assistance, +2 for safety/equipment ?  ?  ?General bed mobility comments: totalA for all aspects of bed mobility. Little to no initiation by patient and not following commands consistently ?  ? ?Transfers ?Overall transfer level: Needs assistance ?Equipment used: 2 person hand held assist ?Transfers: Sit to/from Stand, Bed to chair/wheelchair/BSC ?Sit to Stand: Max assist, +2 physical assistance, +2 safety/equipment ?  ?Step pivot transfers: Max assist, +2 physical assistance, +2 safety/equipment ?  ?  ?  ?General transfer comment: able to stand from EOB with maxA+2 then she was able to take pivotal steps towards recliner with maxA+2 for balance and sequencing. ?  ? ?Ambulation/Gait ?  ?  ?  ?  ?  ?  ?  ?  ? ? ?Stairs ?  ?  ?  ?  ?  ? ? ?Wheelchair Mobility ?  ? ?Modified Rankin (Stroke Patients Only) ?  ? ? ?  ?Balance Overall balance assessment: Needs assistance ?Sitting-balance support: Bilateral upper extremity supported, Feet supported ?Sitting balance-Leahy Scale: Poor ?Sitting balance - Comments: L lateral lean in sitting with up to maxA to maintain sitting at times but primarily minA for sitting balance with single UE support ?Postural control: Posterior lean, Left lateral lean ?Standing balance support: Bilateral upper extremity supported, Reliant on assistive device for balance ?Standing balance-Leahy Scale: Zero ?Standing balance comment: maxA+2 ?  ?  ?  ?  ?  ?  ?  ?  ?  ?  ?  ?  ? ?  ?Cognition Arousal/Alertness: Awake/alert ?Behavior During Therapy: Flat affect ?Overall Cognitive Status: History of cognitive impairments - at baseline ?Area of Impairment: Awareness, Following commands, Safety/judgement, Attention ?  ?  ?  ?  ?  ?  ?  ?  ?  ?  Current Attention Level: Focused ?Memory: Decreased short-term memory ?Following Commands: Follows one step commands inconsistently, Follows one step commands with increased  time ?Safety/Judgement: Decreased awareness of safety, Decreased awareness of deficits ?Awareness: Intellectual ?Problem Solving: Slow processing, Decreased initiation, Difficulty sequencing, Requires verbal cues, Requires tactile cues ?  ?  ?  ? ?  ?Exercises   ? ?  ?General Comments General comments (skin integrity, edema, etc.): VSS on RA ?  ?  ? ?Pertinent Vitals/Pain Pain Assessment ?Pain Assessment: Faces ?Faces Pain Scale: No hurt ?Pain Intervention(s): Monitored during session  ? ? ?Home Living   ?  ?  ?  ?  ?  ?  ?  ?  ?  ?   ?  ?Prior Function    ?  ?  ?   ? ?PT Goals (current goals can now be found in the care plan section) Acute Rehab PT Goals ?Patient Stated Goal: none stated, fairly non verbal ?PT Goal Formulation: With family ?Time For Goal Achievement: 01/08/22 ?Potential to Achieve Goals: Good ?Progress towards PT goals: Progressing toward goals ? ?  ?Frequency ? ? ? Min 4X/week ? ? ? ?  ?PT Plan Current plan remains appropriate  ? ? ?Co-evaluation   ?  ?  ?  ?  ? ?  ?AM-PAC PT "6 Clicks" Mobility   ?Outcome Measure ? Help needed turning from your back to your side while in a flat bed without using bedrails?: Total ?Help needed moving from lying on your back to sitting on the side of a flat bed without using bedrails?: Total ?Help needed moving to and from a bed to a chair (including a wheelchair)?: Total ?Help needed standing up from a chair using your arms (e.g., wheelchair or bedside chair)?: Total ?Help needed to walk in hospital room?: Total ?Help needed climbing 3-5 steps with a railing? : Total ?6 Click Score: 6 ? ?  ?End of Session Equipment Utilized During Treatment: Gait belt ?Activity Tolerance: Patient tolerated treatment well ?Patient left: in chair;with call bell/phone within reach;with chair alarm set;with family/visitor present ?Nurse Communication: Mobility status ?PT Visit Diagnosis: Muscle weakness (generalized) (M62.81);Other abnormalities of gait and mobility (R26.89);Hemiplegia  and hemiparesis ?Hemiplegia - Right/Left: Right ?Hemiplegia - dominant/non-dominant: Dominant ?Hemiplegia - caused by: Cerebral infarction ?  ? ? ?Time: 4599-7741 ?PT Time Calculation (min) (ACUTE ONLY): 25 min ? ?Charges:  $Therapeutic Activity: 23-37 mins          ?          ? ?Ashlye Oviedo A. Gilford Rile, PT, DPT ?Acute Rehabilitation Services ?Pager [phone] ?Office [phone] ? ? ? ?Kaydan Wong A Dillard Pascal ?12/26/2021, 4:56 PM ? ?

## 2021-12-26 NOTE — Progress Notes (Signed)
Initial Nutrition Assessment ? ?DOCUMENTATION CODES:  ? ?Non-severe (moderate) malnutrition in context of chronic illness ? ?INTERVENTION:  ? ?Multivitamin w/ minerals daily ?Continue Ensure Enlive po BID, each supplement provides 350 kcal and 20 grams of protein. ?Feeding assist with all meals ?Encourage good PO intake  ? ?NUTRITION DIAGNOSIS:  ? ?Moderate Malnutrition related to chronic illness as evidenced by mild muscle depletion, mild fat depletion, percent weight loss. ? ?GOAL:  ? ?Patient will meet greater than or equal to 90% of their needs ? ?MONITOR:  ? ?PO intake, Supplement acceptance, Labs, Weight trends ? ?REASON FOR ASSESSMENT:  ? ?Malnutrition Screening Tool ?  ? ?ASSESSMENT:  ? ?88 y.o. female presented to the ED with aphasia and L gaze, slurred speech. PMH includes TIA, CVA, GERD, HTN, T2DM, and A. Fib. Pt admitted with L BG/PLIC small infarct.  ? ?Pt sitting up in chair, family at bedside. Family also feeding pt lunch at time of RD visit.  ? ?Family reports that pt appetite was good and normal. States that the past 4 days - week, pt was eating less and she would eat very slow. States that she was not drinking any ONS at home. ? ?Family denies any weight loss recently. Pt ambulate with a walker at home. Per EMR, pt has had a 9% weight loss within 4 months, which is clinically significant for time frame.  ? ?Discussed the use to ONS here and at home if pt appetite is not great and she is not eating a lot. Family agreeable to ONS. ? ?Medications reviewed and include: IV antibiotics  ?Labs reviewed: Sodium 148, Creatinine 1.86  ? ?NUTRITION - FOCUSED PHYSICAL EXAM: ? ?Flowsheet Row Most Recent Value  ?Orbital Region Mild depletion  ?Upper Arm Region Mild depletion  ?Thoracic and Lumbar Region Mild depletion  ?Buccal Region Mild depletion  ?Temple Region No depletion  ?Clavicle Bone Region Mild depletion  ?Clavicle and Acromion Bone Region Mild depletion  ?Scapular Bone Region Mild depletion  ?Dorsal  Hand Unable to assess  ?Patellar Region Unable to assess  ?Anterior Thigh Region Unable to assess  ?Posterior Calf Region Unable to assess  ?Edema (RD Assessment) None  ?Hair Reviewed  ?Eyes Reviewed  ?Mouth Reviewed  ?Skin Reviewed  ?Nails Reviewed  ? ?Diet Order:   ?Diet Order   ? ?       ?  DIET - DYS 1 Room service appropriate? Yes; Fluid consistency: Thin  Diet effective now       ?  ? ?  ?  ? ?  ? ?EDUCATION NEEDS:  ? ?No education needs have been identified at this time ? ?Skin:  Skin Assessment: Reviewed RN Assessment ? ?Last BM:  Prior to admission ? ?Height:  ?Ht Readings from Last 1 Encounters:  ?12/25/21 5\' 5"  (1.651 m)  ? ?Weight:  ?Wt Readings from Last 1 Encounters:  ?12/25/21 56.8 kg  ? ?Ideal Body Weight:  56.8 kg ? ?BMI:  Body mass index is 20.83 kg/m?. ? ?Estimated Nutritional Needs:  ?Kcal:  1600-1800 ?Protein:  80-95 grams ?Fluid:  >/= 1.6 L ? ? ? ?Hermina Barters RD, LDN ?Clinical Dietitian ?See AMiON for contact information.  ? ?

## 2021-12-26 NOTE — Progress Notes (Addendum)
Daughter in rm asked NT if pt was going to get any meds to help her relax because she's picking at things and the only thing she seen her nurse give her was a shot. She also wanted the equipment taken off because it was bothering the pt. Daughter was sitting in the rm when I gave her mother EC ASA in chocolate pudding and I explained it to both and what it was for. The daughter said earlier today they put her medicine in something and crushed it up. I explained I couldn't do that with this med because it was EC so they have a protective coating on them so they don't irritate the stomach and is a slowly released. But I will msg the doctor and see if starting tomorrow we can give her something different that we can crush. The daughter then suggested giving her some water to see if she'd swallow it better. I explained this to the NT and said if the daughter wanted to talk to me to let me know. The NT informed the daughter the equipment couldn't be taken off because that is how we monitor her. ?

## 2021-12-26 NOTE — Progress Notes (Signed)
Speech Language Pathology Treatment: Dysphagia  ?Patient Details ?Name: Patricia Dudley ?MRN: [id number] ?DOB: 1930/01/10 ?Today's Date: 12/26/2021 ?Time: 5997-7414 ?SLP Time Calculation (min) (ACUTE ONLY): 25 min ? ?Assessment / Plan / Recommendation ?Clinical Impression ? Patient seen by SLP for skilled treatment session focused on dysphagia goals. Her two daughters were at bedside and SLP did provide them with education and brief demonstration on how to use thickener packets. Patient was awake and alert, sitting in recliner. She was very delayed with verbal responses and was not able to make a selection when presented with choices but did better with yes/no. SLP observed patient with PO intake of Dys 2 solids (diced canned peaches), thin liquids and nectar thick liquids. She exhibited prolonged mastication with dys 2 solids, suspected delayed swallow initiation with thin liquids and nectar thick liquids and immediate and delayed cough with thin liquids. No cough response observed with nectar thick liquids via straw sips. SLP is recommending change liquids from thin to nectar thick at this time and will follow for diet toleration, ability to upgrade versus needing objective swallow evaluation. ? ?  ?HPI HPI: ALAN RILES is a 88 y.o. female who presented to [facility] ED via EMS for aphasia, left gaze and slurred speech. Pt with recent episondes of vomiting at home and in ED. MRI 5/2: "positive for acute small vessel infarct at the left internal capsule." CXR 5/1: "Mild airspace disease at the left base, favor atelectasis."  Pt  with past medical history of HTN, HLD, A fib not on AC, GI bleed, dementia, CVA, hypothyroidism and DM. ?  ?   ?SLP Plan ? Continue with current plan of care ? ?  ?  ?Recommendations for follow up therapy are one component of a multi-disciplinary discharge planning process, led by the attending physician.  Recommendations may be updated based on patient status, additional functional criteria  and insurance authorization. ?  ? ?Recommendations  ?Diet recommendations: Dysphagia 1 (puree);Nectar-thick liquid ?Liquids provided via: Cup;Straw ?Medication Administration: Crushed with puree ?Supervision: Patient able to self feed;Full supervision/cueing for compensatory strategies;Staff to assist with self feeding ?Compensations: Slow rate;Small sips/bites;Minimize environmental distractions ?Postural Changes and/or Swallow Maneuvers: Seated upright 90 degrees  ?   ?    ?   ? ? ? ? Oral Care Recommendations: Oral care BID ?Follow Up Recommendations: Follow physician's recommendations for discharge plan and follow up therapies ?Assistance recommended at discharge: Frequent or constant Supervision/Assistance ?SLP Visit Diagnosis: Dysphagia, unspecified (R13.10) ?Plan: Continue with current plan of care ? ? ? ? ?  ?  ? ? ?Sonia Baller, MA, CCC-SLP ?Speech Therapy ? ?

## 2021-12-26 NOTE — Progress Notes (Signed)
Pt has problems swallowing whole meds even in pudding. Has EC ASA ordered at HS. ? Something different or can we crush it although it's EC?? ?

## 2021-12-27 DIAGNOSIS — J189 Pneumonia, unspecified organism: Secondary | ICD-10-CM | POA: Diagnosis not present

## 2021-12-27 DIAGNOSIS — E440 Moderate protein-calorie malnutrition: Secondary | ICD-10-CM | POA: Insufficient documentation

## 2021-12-27 DIAGNOSIS — I6330 Cerebral infarction due to thrombosis of unspecified cerebral artery: Secondary | ICD-10-CM | POA: Diagnosis not present

## 2021-12-27 LAB — CBC
HCT: 30.3 % — ABNORMAL LOW (ref 36.0–46.0)
Hemoglobin: 10 g/dL — ABNORMAL LOW (ref 12.0–15.0)
MCH: 27.6 pg (ref 26.0–34.0)
MCHC: 33 g/dL (ref 30.0–36.0)
MCV: 83.7 fL (ref 80.0–100.0)
Platelets: 218 10*3/uL (ref 150–400)
RBC: 3.62 MIL/uL — ABNORMAL LOW (ref 3.87–5.11)
RDW: 14 % (ref 11.5–15.5)
WBC: 8.5 10*3/uL (ref 4.0–10.5)
nRBC: 0 % (ref 0.0–0.2)

## 2021-12-27 LAB — LEGIONELLA PNEUMOPHILA SEROGP 1 UR AG: L. pneumophila Serogp 1 Ur Ag: NEGATIVE

## 2021-12-27 LAB — BASIC METABOLIC PANEL
Anion gap: 7 (ref 5–15)
BUN: 14 mg/dL (ref 8–23)
CO2: 23 mmol/L (ref 22–32)
Calcium: 8.2 mg/dL — ABNORMAL LOW (ref 8.9–10.3)
Chloride: 113 mmol/L — ABNORMAL HIGH (ref 98–111)
Creatinine, Ser: 1.92 mg/dL — ABNORMAL HIGH (ref 0.44–1.00)
GFR, Estimated: 25 mL/min — ABNORMAL LOW (ref >60)
Glucose, Bld: 144 mg/dL — ABNORMAL HIGH (ref 70–99)
Potassium: 3.6 mmol/L (ref 3.5–5.1)
Sodium: 143 mmol/L (ref 135–145)

## 2021-12-27 LAB — GLUCOSE, CAPILLARY
Glucose-Capillary: 149 mg/dL — ABNORMAL HIGH (ref 70–99)
Glucose-Capillary: 178 mg/dL — ABNORMAL HIGH (ref 70–99)
Glucose-Capillary: 160 mg/dL — ABNORMAL HIGH (ref 70–99)

## 2021-12-27 MED ORDER — LEVOTHYROXINE SODIUM 112 MCG PO TABS
112.0000 ug | ORAL_TABLET | Freq: Every day | ORAL | Status: DC
  Administered 2021-12-27 – 2022-01-03 (×8): 112 ug via ORAL
  Filled 2021-12-27 (×8): qty 1

## 2021-12-27 MED ORDER — LATANOPROST 0.005 % OP SOLN
1.0000 [drp] | Freq: Every day | OPHTHALMIC | Status: DC
  Administered 2021-12-28 – 2022-01-02 (×6): 1 [drp] via OPHTHALMIC
  Filled 2021-12-27 (×2): qty 2.5

## 2021-12-27 MED ORDER — METOPROLOL TARTRATE 12.5 MG HALF TABLET
12.5000 mg | ORAL_TABLET | Freq: Two times a day (BID) | ORAL | Status: DC
  Administered 2021-12-27 – 2021-12-28 (×3): 12.5 mg via ORAL
  Filled 2021-12-27 (×3): qty 1

## 2021-12-27 MED ORDER — VITAMIN B-12 100 MCG PO TABS
100.0000 ug | ORAL_TABLET | Freq: Every day | ORAL | Status: DC
  Administered 2021-12-27 – 2021-12-31 (×5): 100 ug via ORAL
  Filled 2021-12-27 (×9): qty 1

## 2021-12-27 NOTE — Progress Notes (Addendum)
?PROGRESS NOTE ? ? ? Patricia Dudley  TOI:[id number] DOB: [birth year] DOA: 12/24/2021 ?PCP: Tracie Harrier, MD  ? ?Chief Complaint  ?Patient presents with  ? Code Stroke  ? ? ?Brief Narrative:  ? 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 ?  ? ?Assessment & Plan: ?  ?Principal Problem: ?  PNA (pneumonia) ?Active Problems: ?  Cerebral thrombosis with cerebral infarction ?  Malnutrition of moderate degree ? ?Acute CVA ?- left BG/PLIC small infarct secondary to small vessel disease source given the patient, however cardioembolic cannot be completely ruled out given history of A-fib not on [facility] ?-MRI brain significant for left basal ganglia/intracapsular small infarct ?-MRA unremarkable ?-Carotid Doppler unremarkable ?-2D echo with EF 60 to 65% ?-LDL at 110-A1c at 6.5 ?-Not on any antithrombotic therapy prior to admission, now on aspirin 81 mg daily, unfortunately she is not a good candidate for dual antiplatelet therapy due to history of severe GI bleed. ?-Recommendation for CIR. ?-Allow for permissive hypertension ? ? ?Underlying A-fib  ?- not on anticoagulation secondary to GI bleeds ?-Started on aspirin due to CVA. ?-As well she is started on low-dose metoprolol for heart rate control ? ?Aspiration  pneumonia ?-Chest x-ray significant for left lung base pneumonia ?-Continue with IV Rocephin and azithromycin ? ?Acute metabolic encephalopathy due to above ?-Followed closely by PT/OT/SLP ?-Patient has been gradually improving, as well her dysphagia, she was advanced to dysphagia 2 with thin liquid ? ?Acute kidney injury ?-Due to hypovolemia/prerenal, given dehydration with  ACEs/Demadex use ?-Improving with hydration ? ?Hypernatremia  ?-Change fluid to D5 half-normal saline. ? ?Prior GI bleed 2018 secondary to AVMs ?Poor candidacy for anticoagulation may be a candidate for antiplatelet-defer to neurology ? ?Hypothyroid ?-Continue with Synthroid, she remains on IV, change to p.o. when oral intake is more reliable ? 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 ?Hypertension ?-Continue to hold meds to allow for permissive hypertension ? ?Hyperlipidemia ?-LDL is 110, started on Crestor ? ? ?  ? ?DVT prophylaxis: [place] heparin ?Code Status: DNR ?Family Communication: Daughter at bedside ?Disposition: CIR ? ?Status is: Inpatient ?Remains inpatient appropriate ?  ?Consultants:  ?Neurology ? ?Subjective: ? ?The events overnight as discussed with staff, daughter at bedside, patient denies any complaints today.   ? ? ? ?Objective: ?Vitals:  ? 12/27/21 0400 12/27/21 0734 12/27/21 0824 12/27/21 1148  ?BP: (!) 153/75 (!) 180/67 (!) 157/66 (!) 163/52  ?Pulse: 99 85 82 79  ?Resp: 18 17  17   ?Temp:  98.1 ?F (36.7 ?C)  98.1 ?F (36.7 ?C)  ?TempSrc:  Oral  Oral  ?SpO2: 98% 98%  97%  ?Weight:      ?Height:      ? ? ?Intake/Output Summary (Last 24 hours) at 12/27/2021 1158 ?Last data filed at 12/26/2021 1608 ?Gross per 24 hour  ?Intake 2297.68 ml  ?Output --  ?Net 2297.68 ml  ? ? ?Filed Weights  ? 12/24/21 1437 12/24/21 1453 12/25/21 1446  ?Weight: 59.8 kg 59.4 kg 56.8 kg  ? ? ?Examination: ? ?She is more awake and appropriate today, frail, deconditioned, mildly confused  but able to follow simple commands ?Symmetrical Chest wall movement, Good air movement bilaterally, CTAB ?RRR,No Gallops,Rubs or new Murmurs, No Parasternal Heave ?+ve B.Sounds, Abd Soft, No tenderness, No rebound - guarding or rigidity. ?No Cyanosis, Clubbing or edema, No new Rash or bruise   ? ? ? ? ? ?Data Reviewed: I have personally  reviewed following labs and imaging studies ? ?CBC: ?Recent Labs  ?Lab 12/24/21 ?1436 12/24/21 ?1438 12/25/21 ?0530 12/26/21 ?0106 12/27/21 ?2725  ?WBC 16.0*  --  18.4* 11.8* 8.5  ?NEUTROABS 12.2*  --   --   --   --   ?HGB 12.1 12.2 10.5* 10.7* 10.0*  ?HCT 35.9* 36.0 32.0* 33.2* 30.3*  ?MCV 82.7  --  84.7 84.3 83.7  ?PLT 313  --  251 238 218  ? ? ? ?Basic Metabolic Panel: ?Recent Labs  ?Lab 12/24/21 ?1436 12/24/21 ?1438 12/25/21 ?0530 12/26/21 ?0106 12/27/21 ?3664  ?NA 144 146* 149* 148* 143  ?K 4.5 4.5 4.2 3.7 3.6  ?CL 111 112* 120* 116* 113*  ?CO2 23  --  24 23 23   ?GLUCOSE 154* 161* 129* 109* 144*  ?BUN 27* 36* 21 15 14   ?CREATININE 2.33* 2.40* 2.05* 1.86* 1.92*  ?CALCIUM 9.1  --  8.2* 8.3* 8.2*  ? ? ? ?GFR: ?Estimated Creatinine Clearance: 17.8 mL/min (A) (by C-G formula based on SCr of 1.92 mg/dL (H)). ? ?Liver Function Tests: ?Recent Labs  ?Lab 12/24/21 ?1436 12/26/21 ?0106  ?AST 25 38  ?ALT 11 23  ?ALKPHOS 56 49  ?BILITOT 0.8 0.6  ?PROT 7.1 6.2*  ?ALBUMIN 3.6 2.8*  ? ? ? ?CBG: ?Recent Labs  ?Lab 12/26/21 ?1254 12/26/21 ?1641 12/26/21 ?2337 12/27/21 ?0737 12/27/21 ?1146  ?GLUCAP 151* 156* 168* 149* 178*  ? ? ? ? ?Recent Results (from the past 240 hour(s))  ?Resp Panel by RT-PCR (Flu A&B, Covid) Peripheral     Status: None  ? Collection Time: 12/24/21  2:30 PM  ? Specimen: Peripheral; Nasopharyngeal(NP) swabs in vial transport medium  ?Result Value Ref Range Status  ? SARS Coronavirus 2 by RT PCR NEGATIVE NEGATIVE Final  ?  Comment: (NOTE) ?SARS-CoV-2 target nucleic acids are NOT DETECTED. ? ?The SARS-CoV-2 RNA is generally detectable in upper respiratory ?specimens during the acute phase of infection. The lowest ?concentration of SARS-CoV-2 viral copies this assay can detect is ?138 copies/mL. A negative result does not preclude SARS-Cov-2 ?infection and should not be used as the sole basis for treatment or ?other patient management decisions. A negative result may occur with  ?improper specimen  collection/handling, submission of specimen other ?than nasopharyngeal swab, presence of viral mutation(s) within the ?areas targeted by this assay, and inadequate number of viral ?copies(<138 copies/mL). A negative result must be combined with ?clinical observations, patient history, and epidemiological ?information. The expected result is Negative. ? ?Fact Sheet for Patients:  ?EntrepreneurPulse.com.au ? ?Fact Sheet for Healthcare Providers:  ?IncredibleEmployment.be ? ?This test is no t yet approved or cleared by the [place] FDA and  ?has been authorized for detection and/or diagnosis of SARS-CoV-2 by ?FDA under an Emergency Use Authorization (EUA). This EUA will remain  ?in effect (meaning this test can be used) for the duration of the ?COVID-19 declaration under Section 564(b)(1) of the Act, 21 ?U.S.C.section 360bbb-3(b)(1), unless the authorization is terminated  ?or revoked sooner.  ? ? ?  ? Influenza A by PCR NEGATIVE NEGATIVE Final  ? Influenza B by PCR NEGATIVE NEGATIVE Final  ?  Comment: (NOTE) ?The Xpert  Xpress SARS-CoV-2/FLU/RSV plus assay is intended as an aid ?in the diagnosis of influenza from Nasopharyngeal swab specimens and ?should not be used as a sole basis for treatment. Nasal washings and ?aspirates are unacceptable for Xpert Xpress SARS-CoV-2/FLU/RSV ?testing. ? ?Fact Sheet for Patients: ?EntrepreneurPulse.com.au ? ?Fact Sheet for Healthcare Providers: ?IncredibleEmployment.be ? ?This test is not yet approved or cleared by the [place] FDA and ?has been authorized for detection and/or diagnosis of SARS-CoV-2 by ?FDA under an Emergency Use Authorization (EUA). This EUA will remain ?in effect (meaning this test can be used) for the duration of the ?COVID-19 declaration under Section 564(b)(1) of the Act, 21 U.S.C. ?section 360bbb-3(b)(1), unless the authorization is terminated or ?revoked. ? ?Performed at [facility], [place] [address] [place], [place] ?[address] ?  ?Urine Culture     Status: None  ? Collection Time: 12/24/21  3:07 PM  ? Specimen: Urine, Catheterized  ?Result Value Ref Range Status  ? Specimen Description URINE, CA

## 2021-12-27 NOTE — Progress Notes (Signed)
Inpatient Rehab Admissions Coordinator:  ? ?I will place a rehab consult per our protocol and an [facility] will follow up for full assessment.  ? ?Shann Medal, PT, DPT ?Admissions Coordinator ?[phone] ?12/27/21  ?11:30 AM ? ?

## 2021-12-27 NOTE — TOC Initial Note (Signed)
Transition of Care (TOC) - Initial/Assessment Note  ? ? ?Patient Details  ?Name: Patricia Dudley ?MRN: [id number] ?Date of Birth: [birth year] ? ?Transition of Care (TOC) CM/SW Contact:    ?Benard Halsted, LCSW ?Phone Number: ?12/27/2021, 4:21 PM ? ?Clinical Narrative:                 ?CSW spoke with patient's daughter, [name], at bedside. CSW explained secondary option of SNF in the event that patient's insurance does not approve CIR. Daughter expressed understanding. CSW will continue to follow.  ? ?Expected Discharge Plan: [place] ?Barriers to Discharge: Insurance Authorization ? ? ?Patient Goals and CMS Choice ?Patient states their goals for this hospitalization and ongoing recovery are:: Rehab ?CMS Medicare.gov Compare Post Acute Care list provided to:: Patient Represenative (must comment) ?Choice offered to / list presented to : Adult Children ? ?Expected Discharge Plan and Services ?Expected Discharge Plan: [place] ?In-house Referral: Clinical Social Work ?  ?Post Acute Care Choice: IP Rehab ?Living arrangements for the past 2 months: [place] ?                ?  ?  ?  ?  ?  ?  ?  ?  ?  ?  ? ?Prior Living Arrangements/Services ?Living arrangements for the past 2 months: [place] ?Lives with:: Adult Children ?Patient language and need for interpreter reviewed:: Yes ?Do you feel safe going back to the place where you live?: Yes      ?Need for Family Participation in Patient Care: Yes (Comment) ?Care giver support system in place?: Yes (comment) ?  ?Criminal Activity/Legal Involvement Pertinent to Current Situation/Hospitalization: No - Comment as needed ? ?Activities of Daily Living ?Home Assistive Devices/Equipment: Eyeglasses, Hearing aid, Gilford Rile (specify type) ?ADL Screening (condition at time of admission) ?Patient's cognitive ability adequate to safely complete daily activities?: No ?Is the patient deaf or have difficulty hearing?: Yes ?Does the patient have difficulty  seeing, even when wearing glasses/contacts?: No ?Does the patient have difficulty concentrating, remembering, or making decisions?: Yes ?Patient able to express need for assistance with ADLs?: Yes ?Does the patient have difficulty dressing or bathing?: Yes ?Independently performs ADLs?: No ?Communication: Independent ?Dressing (OT): Needs assistance ?Is this a change from baseline?: Pre-admission baseline ?Grooming: Needs assistance ?Is this a change from baseline?: Pre-admission baseline ?Feeding: Independent ?Bathing: Needs assistance ?Is this a change from baseline?: Pre-admission baseline ?Toileting: Needs assistance ?Is this a change from baseline?: Pre-admission baseline ?In/Out Bed: Needs assistance ?Is this a change from baseline?: Pre-admission baseline ?Walks in Home: Needs assistance ?Is this a change from baseline?: Pre-admission baseline ?Does the patient have difficulty walking or climbing stairs?: Yes ?Weakness of Legs: Both ?Weakness of Arms/Hands: None ? ?Permission Sought/Granted ?Permission sought to share information with : Facility Sport and exercise psychologist, Family Supports ?Permission granted to share information with : Yes, Verbal Permission Granted ? Share Information with NAME: [name] ?   ? Permission granted to share info w Relationship: Daugher ? Permission granted to share info w Contact Information: [phone] ? ?Emotional Assessment ?Appearance:: Appears stated age ?Attitude/Demeanor/Rapport: Gracious ?Affect (typically observed): Pleasant, Quiet ?Orientation: : Oriented to Self, Oriented to Place ?Alcohol / Substance Use: Not Applicable ?Psych Involvement: No (comment) ? ?Admission diagnosis:  Acute encephalopathy [G93.40] ?PNA (pneumonia) [J18.9] ?Patient Active Problem List  ? Diagnosis Date Noted  ? Malnutrition of moderate degree 12/27/2021  ? PNA (pneumonia) 12/24/2021  ? Cerebral thrombosis with cerebral infarction 12/24/2021  ? Anemia,  unspecified 06/13/2017  ? Diabetic  nephropathy ([place]) 06/13/2017  ? GERD (gastroesophageal reflux disease) 06/13/2017  ? Hyperlipidemia 06/13/2017  ? Hypertension 06/13/2017  ? Osteoporosis 06/13/2017  ? Acute posthemorrhagic anemia   ? Gastritis without bleeding   ? Angiodysplasia of stomach and duodenum with hemorrhage   ? Acute GI bleeding 02/27/2017  ? CVA (cerebral vascular accident) ([place]) 12/20/2016  ? Hypertensive urgency 12/19/2016  ? GI bleed 08/07/2016  ? Type 2 diabetes mellitus with stage 3 chronic kidney disease, without long-term current use of insulin ([place]) 11/01/2015  ? Chronic a-fib ([place]) 09/01/2015  ? Malignant essential hypertension 07/26/2015  ? Renal insufficiency 07/26/2015  ? TIA (transient ischemic attack) 07/26/2015  ? Vertigo 07/26/2015  ? Acquired hypothyroidism 07/04/2015  ? ?PCP:  Tracie Harrier, MD ?Pharmacy:   ?[place], [place] [facility] ?[place] [address] ?Phone: [phone] Fax: [phone] ? ? ? ? ?Social Determinants of Health (SDOH) Interventions ?  ? ?Readmission Risk Interventions ?   ? View : No data to display.  ?  ?  ?  ? ? ? ?

## 2021-12-27 NOTE — Progress Notes (Signed)
Physical Therapy Treatment ?Patient Details ?Name: Patricia Dudley ?MRN: [id number] ?DOB: [birth year] ?Today's Date: 12/27/2021 ? ? ?History of Present Illness Pt is a 88 y/o female presenting on 5/1 with aphasia, L gaze and slurred speech. MRI 5/1 with "positive for acute small vessel infarct at the left internal capsule". CXR 5/1: "Mild airspace disease at the left base, favor atelectasis."PMH includes: afib, CVA, dementia, DM, HTN, GI bleed. ? ?  ?PT Comments  ? ? Pt demos improved ability to follow 1 step commands and inc strength; able to assist with transfer to chair with only 1 person mod A today.  Pt. Maintains difficulty with postural control with heavy L sided lean, more pronounced in sitting which interferes with independent sitting balance.  Pt demos good tolerance to initiation of LE therex, able to follow TC/VC's to participate minimally with heel slides and ankle pumps.  Less participation is noted with other therex.  Family encouraged to allow pt to sit in chair for at least 1 hr if not longer and discussed importance of OOB.  Family in agreement.   ?Recommendations for follow up therapy are one component of a multi-disciplinary discharge planning process, led by the attending physician.  Recommendations may be updated based on patient status, additional functional criteria and insurance authorization. ? ?Follow Up Recommendations ? Acute inpatient rehab (3hours/day) (If not approved, SNF placement) ?  ?  ?Assistance Recommended at Discharge Frequent or constant Supervision/Assistance  ?Patient can return home with the following A lot of help with walking and/or transfers;A lot of help with bathing/dressing/bathroom;Assistance with feeding;Assist for transportation;Assistance with cooking/housework;Direct supervision/assist for financial management;Direct supervision/assist for medications management;Help with stairs or ramp for entrance ?  ?Equipment Recommendations ?    ?  ?Recommendations for Other  Services   ? ? ?  ?Precautions / Restrictions Precautions ?Precautions: Fall  ?  ? ?Mobility ? Bed Mobility ?Overal bed mobility: Needs Assistance ?Bed Mobility: Supine to Sit ?  ?  ?Supine to sit: Mod assist ?  ?  ?General bed mobility comments: Pt. transfers from sup > sitting EOB with mod A for trunk support and LE negotiation and use of chuck pad to scoot pelvis forward to EOB.  Poor sitting balance with inc L sided lean with inc VC/TC to correct.  Needs max A in sitting to prevent LOB. ?Patient Response: Cooperative ? ?Transfers ?Overall transfer level: Needs assistance ?  ?Transfers: Sit to/from Stand, Bed to chair/wheelchair/BSC ?Sit to Stand: Mod assist ?Stand pivot transfers: Mod assist ?Step pivot transfers: Mod assist ?  ?  ?  ?General transfer comment: Pt. performs sit > stand with mod A with PT standing in front blocking knees and use of gait belt.  Pt req's assist to position arms around PT and has difficulty maintaining UE positioning. ?  ? ?Ambulation/Gait ?  ?  ?  ?  ?  ?  ?  ?  ? ? ?Stairs ?  ?  ?  ?  ?  ? ? ?Wheelchair Mobility ?  ? ?Modified Rankin (Stroke Patients Only) ?  ? ? ?  ?Balance Overall balance assessment: Needs assistance ?Sitting-balance support: Bilateral upper extremity supported, Feet supported ?Sitting balance-Leahy Scale: Poor ?  ?Postural control: Left lateral lean ?Standing balance support: During functional activity, Bilateral upper extremity supported ?Standing balance-Leahy Scale: Poor ?  ?  ?  ?  ?  ?  ?  ?  ?  ?  ?  ?  ?  ? ?  ?Cognition Arousal/Alertness: Awake/alert ?  Behavior During Therapy: [facility] for tasks assessed/performed ?Overall Cognitive Status: Difficult to assess (Pt with hx of cognitive impairments and now with CVA.) ?  ?  ?  ?  ?  ?  ?  ?  ?  ?  ?  ?  ?Following Commands: Follows one step commands inconsistently ?  ?  ?  ?  ?  ?  ? ?  ?Exercises General Exercises - Lower Extremity ?Ankle Circles/Pumps: Both, 10 reps, AAROM ?Heel Slides: AAROM, Both, 20  reps ?Hip ABduction/ADduction: AAROM, Both, 10 reps ?Straight Leg Raises: AAROM, Both, 10 reps ? ?  ?General Comments   ?  ?  ? ?Pertinent Vitals/Pain Pain Assessment ?Pain Assessment: 0-10 ?Pain Score: 0-No pain  ? ? ?Home Living   ?  ?  ?  ?  ?  ?  ?  ?  ?  ?   ?  ?Prior Function    ?  ?  ?   ? ?PT Goals (current goals can now be found in the care plan section) Progress towards PT goals: Progressing toward goals ? ?  ?Frequency ? ? ?   ? ? ? ?  ?PT Plan Current plan remains appropriate  ? ? ?Co-evaluation   ?  ?  ?  ?  ? ?  ?AM-PAC PT "6 Clicks" Mobility   ?Outcome Measure ? Help needed turning from your back to your side while in a flat bed without using bedrails?: A Lot ?Help needed moving from lying on your back to sitting on the side of a flat bed without using bedrails?: A Lot ?Help needed moving to and from a bed to a chair (including a wheelchair)?: A Lot ?Help needed standing up from a chair using your arms (e.g., wheelchair or bedside chair)?: A Lot ?Help needed to walk in hospital room?: Total ?Help needed climbing 3-5 steps with a railing? : Total ?6 Click Score: 10 ? ?  ?End of Session Equipment Utilized During Treatment: Gait belt ?Activity Tolerance: Patient tolerated treatment well ?Patient left: in chair;with call bell/phone within reach;with chair alarm set;with nursing/sitter in room;with family/visitor present ?Nurse Communication: Mobility status ?  ?  ? ? ?Time: 9528-4132 ?PT Time Calculation (min) (ACUTE ONLY): 18 min ? ?Charges:  $Therapeutic Activity: 8-22 mins          ?          ?Patricia Dudley A. Talal Fritchman, PT, DPT ?Acute Rehabilitation Services ?Office: [phone]  ? ? ?[place] ?12/27/2021, 11:41 AM ? ?

## 2021-12-27 NOTE — Care Management Important Message (Signed)
Important Message ? ?Patient Details  ?Name: Patricia Dudley ?MRN: [id number] ?Date of Birth: 05/16/1930 ? ? ?Medicare Important Message Given:  Yes ? ? ? ? ?[name] ?12/27/2021, 3:51 PM ?

## 2021-12-27 NOTE — Progress Notes (Signed)
Speech Language Pathology Treatment: Dysphagia  ?Patient Details ?Name: Patricia Dudley ?MRN: [id number] ?DOB: 05-19-1930 ?Today's Date: 12/27/2021 ?Time: 9935-7017 ?SLP Time Calculation (min) (ACUTE ONLY): 36 min ? ?Assessment / Plan / Recommendation ?Clinical Impression ? Patient seen for skilled SLP today to determine indication for MBS and po tolerance. Pt's daughter, Mardene Celeste, in room with her and advised pt just started to go to sleep.  Pt allowed repositioning with SLP assist and was able to swallow secretions *minimal retained* per SLP verbal cue.  Set up po of graham crackers, applesauce, thin water intake - assisting pt hand over hand with spooon - which clinically judged to improve proprioception and timeliness of swallowing.  Pt able to hold her own cup today and self feed cracker *though tends to place toward left side*.   ? ?Pt observed consuming sequential boluses of thin - delayed eructation noted but no indication of aspiration.  Recommend to advance diet to dys3/pureed meats and thin liquids with precautions. At this time, pt does not need instrumental evaluation in this SLP's opinion.  Suspect her pneumonia may have been due to her N/V prior to admit.  ?Daughter reports pt continues to say no without intention.  ? ?Educated daughter to general precautions, gustatory changes and oral holding associated with progressive cognitive decline.  Daughter reports pt was holding food at home - advised using food/drinks with more sensory input.  Will follow up for dysphagia management and language/cognitive evaluation and treatement.  Using teach back, daughter educated and agreeable.  ?HPI HPI: Patricia Dudley is a 88 y.o. female who [facility] to [facility] ED via EMS for aphasia, left gaze and slurred speech. Pt with recent episondes of vomiting at home and in ED. MRI 5/2: "positive for acute small vessel infarct at the left internal capsule." CXR 5/1: "Mild airspace disease at the left base, favor atelectasis."   Pt  with past medical history of HTN, HLD, A fib not on AC, GI bleed, dementia, CVA, hypothyroidism and DM. ?  ?   ?SLP Plan ? Continue with current plan of care ? ?  ?  ?Recommendations for follow up therapy are one component of a multi-disciplinary discharge planning process, led by the attending physician.  Recommendations may be updated based on patient status, additional functional criteria and insurance authorization. ?  ? ?Recommendations  ?Diet recommendations: Dysphagia 3 (mechanical soft);Thin liquid ?Liquids provided via: Cup;Straw ?Medication Administration: Whole meds with puree (can try whole with puree or ice cream) ?Supervision: Patient able to self feed;Full supervision/cueing for compensatory strategies;Staff to assist with self feeding ?Compensations: Slow rate;Small sips/bites;Minimize environmental distractions ?Postural Changes and/or Swallow Maneuvers: Seated upright 90 degrees;Upright 30-60 min after meal  ?   ?    ?   ? ? ? ? Oral Care Recommendations: Oral care BID ?Follow Up Recommendations: Follow physician's recommendations for discharge plan and follow up therapies ?Assistance recommended at discharge: Frequent or constant Supervision/Assistance ?SLP Visit Diagnosis: Dysphagia, unspecified (R13.10);Dysphagia, oral phase (R13.11) ?Plan: Continue with current plan of care ? ? ? ? ?  ?  ? ?Kathleen Lime, MS CCC SLP ?Acute Rehab Services ?Office [phone] ?Pager [phone] ? ?Macario Golds ? ?12/27/2021, 8:59 AM ?

## 2021-12-27 NOTE — Progress Notes (Signed)
Inpatient Rehab Admissions Coordinator:  ? ?Met with patient and her family at bedside to discuss CIR recommendations and goals/expectations of rehab stay.  Reviewed 3 hrs/day of therapy, average length of stay 2 weeks, goals of supervision to min assist, and physician follow up.  Family provides 24/7 supervision/min assist for pt at baseline and are prepared to continue this level of support at discharge.  I reviewed insurance auth process and I will open request today.  ? ?Shann Medal, PT, DPT ?Admissions Coordinator ?[phone] ?12/27/21  ?1:16 PM ? ?

## 2021-12-27 NOTE — Evaluation (Signed)
Occupational Therapy Evaluation ?Patient Details ?Name: Patricia Dudley ?MRN: [id number] ?DOB: 1930/07/02 ?Today's Date: 12/27/2021 ? ? ?History of Present Illness Pt is a 88 y/o female presenting on 5/1 with aphasia, L gaze and slurred speech. MRI 5/1 with "positive for acute small vessel infarct at the left internal capsule". CXR 5/1: "Mild airspace disease at the left base, favor atelectasis."PMH includes: afib, CVA, dementia, DM, HTN, GI bleed.  ? ?Clinical Impression ?  ?Pt seated in recliner upon entry and agreeable to OT session. Engaged in grooming with min assist, pt perseverating on washing face when asked to wash hands- requires hand over hand support to initiate tasks. LB bathing simulated by applying lotion, requires assist to open bottle, squeeze out lotion and hand over hand to initiate applying to [place] as well as increasing ROM to complete task. Demonstrating L lateral lean dynamically when seated, sit to stand with mod assist and poor ability to initiate stepping. She is progressing well, remains limited by decreased cognition and generalized weakness.  Updated dc plan to AIR to optimize return to PLOF and decrease burden of care upon return home with daughter.  ?   ? ?Recommendations for follow up therapy are one component of a multi-disciplinary discharge planning process, led by the attending physician.  Recommendations may be updated based on patient status, additional functional criteria and insurance authorization.  ? ?Follow Up Recommendations ? Acute inpatient rehab (3hours/day)  ?  ?Assistance Recommended at Discharge Frequent or constant Supervision/Assistance  ?Patient can return home with the following Two people to help with walking and/or transfers;Two people to help with bathing/dressing/bathroom;Assistance with cooking/housework;Assistance with feeding;Direct supervision/assist for financial management;Direct supervision/assist for medications management;Assist for transportation;Help  with stairs or ramp for entrance ? ?  ?Functional Status Assessment ?    ?Equipment Recommendations ? Other (comment) (defer)  ?  ?Recommendations for Other Services   ? ? ?  ?Precautions / Restrictions Precautions ?Precautions: Fall ?Restrictions ?Weight Bearing Restrictions: No  ? ?  ? ?Mobility Bed Mobility ?  ?  ?  ?  ?  ?  ?  ?  ?  ? ?Transfers ?Overall transfer level: Needs assistance ?  ?Transfers: Sit to/from Stand ?Sit to Stand: Mod assist ?  ?  ?  ?  ?  ?General transfer comment: mod assist to power up and steady, pt with difficulty moving feet with BUE support ?  ? ?  ?Balance Overall balance assessment: Needs assistance ?Sitting-balance support: No upper extremity supported, Feet supported ?Sitting balance-Leahy Scale: Poor ?Sitting balance - Comments: L lateral lean dynamically in recliner during LB ADLs ?Postural control: Left lateral lean ?Standing balance support: Bilateral upper extremity supported, During functional activity ?Standing balance-Leahy Scale: Poor ?Standing balance comment: relies on external and UE support ?  ?  ?  ?  ?  ?  ?  ?  ?  ?  ?  ?   ? ?ADL either performed or assessed with clinical judgement  ? ?ADL Overall ADL's : Needs assistance/impaired ?  ?  ?Grooming: Wash/dry hands;Wash/dry face;Minimal assistance;Sitting ?  ?  ?  ?Lower Body Bathing: Maximal assistance;Sit to/from stand ?Lower Body Bathing Details (indicate cue type and reason): simulated applying lotion to BLEs with mod assist to complete upper legs, hand over hand to sequence to complete task.  Overall max assist to complete full task ?  ?  ?  ?  ?  ?Toilet Transfer Details (indicate cue type and reason): deferred, in recliner upon entry.  Mod assist  sit to stand from recliner ?  ?  ?  ?  ?Functional mobility during ADLs: Moderate assistance ?   ? ? ? ?Vision   ?Additional Comments: daughter reports hx of L eye "droop", pt able to read clock close up but reports blurry at distance  ?   ?Perception   ?  ?Praxis   ?   ? ?Pertinent Vitals/Pain Pain Assessment ?Pain Assessment: No/denies pain  ? ? ? ?Hand Dominance   ?  ?Extremity/Trunk Assessment Upper Extremity Assessment ?Upper Extremity Assessment: Generalized weakness ?  ?  ?  ?  ?  ?Communication   ?  ?Cognition Arousal/Alertness: Awake/alert ?Behavior During Therapy: Flat affect ?Overall Cognitive Status: History of cognitive impairments - at baseline ?  ?  ?  ?  ?  ?  ?  ?  ?  ?  ?  ?  ?  ?  ?  ?  ?General Comments: pt disoriented to place and time, baseline cognitive deficits.  She is able to follow 1 step commands but tends to perseverate on prior task. Demonstrates poor awareness to deficits and slow processing. ?  ?  ?General Comments  daughter present and supportive. ? ?  ?Exercises   ?  ?Shoulder Instructions    ? ? ?Home Living   ?  ?  ?  ?  ?  ?  ?  ?  ?  ?  ?  ?  ?  ?  ?  ?  ?  ?  ? ?  ?Prior Functioning/Environment   ?  ?  ?  ?  ?  ?  ?  ?  ?  ? ?  ?  ?OT Problem List:   ?  ?   ?OT Treatment/Interventions:    ?  ?OT Goals(Current goals can be found in the care plan section) Acute Rehab OT Goals ?Patient Stated Goal: none stated ?OT Goal Formulation: Patient unable to participate in goal setting ?Time For Goal Achievement: 01/08/22 ?Potential to Achieve Goals: Fair  ?OT Frequency: Min 2X/week ?  ? ?Co-evaluation   ?  ?  ?  ?  ? ?  ?AM-PAC OT "6 Clicks" Daily Activity     ?Outcome Measure Help from another person eating meals?: A Lot ?Help from another person taking care of personal grooming?: A Little ?Help from another person toileting, which includes using toliet, bedpan, or urinal?: Total ?Help from another person bathing (including washing, rinsing, drying)?: A Lot ?Help from another person to put on and taking off regular upper body clothing?: A Lot ?Help from another person to put on and taking off regular lower body clothing?: Total ?6 Click Score: 11 ?  ?End of Session Nurse Communication: Mobility status ? ?Activity Tolerance: Patient tolerated treatment  well ?Patient left: in chair;with call bell/phone within reach;with chair alarm set;with family/visitor present ? ?OT Visit Diagnosis: Other abnormalities of gait and mobility (R26.89);Muscle weakness (generalized) (M62.81);Other symptoms and signs involving cognitive function;Cognitive communication deficit (R41.841) ?Symptoms and signs involving cognitive functions: Cerebral infarction  ?              ?Time: 6063-0160 ?OT Time Calculation (min): 26 min ?Charges:  OT General Charges ?$OT Visit: 1 Visit ?OT Treatments ?$Self Care/Home Management : 23-37 mins ? ?Jolaine Artist, OT ?Acute Rehabilitation Services ?Pager [phone] ?Office [phone] ? ? ?[place] ?12/27/2021, 1:59 PM ?

## 2021-12-28 DIAGNOSIS — J189 Pneumonia, unspecified organism: Secondary | ICD-10-CM | POA: Diagnosis not present

## 2021-12-28 DIAGNOSIS — I6330 Cerebral infarction due to thrombosis of unspecified cerebral artery: Secondary | ICD-10-CM | POA: Diagnosis not present

## 2021-12-28 LAB — GLUCOSE, CAPILLARY
Glucose-Capillary: 135 mg/dL — ABNORMAL HIGH (ref 70–99)
Glucose-Capillary: 171 mg/dL — ABNORMAL HIGH (ref 70–99)
Glucose-Capillary: 159 mg/dL — ABNORMAL HIGH (ref 70–99)

## 2021-12-28 MED ORDER — AMOXICILLIN-POT CLAVULANATE 500-125 MG PO TABS
500.0000 mg | ORAL_TABLET | Freq: Two times a day (BID) | ORAL | Status: DC
  Administered 2021-12-28 – 2021-12-29 (×4): 500 mg via ORAL
  Filled 2021-12-28 (×5): qty 1

## 2021-12-28 MED ORDER — METOPROLOL TARTRATE 25 MG PO TABS
25.0000 mg | ORAL_TABLET | Freq: Two times a day (BID) | ORAL | Status: DC
  Administered 2021-12-28 – 2021-12-29 (×3): 25 mg via ORAL
  Filled 2021-12-28 (×3): qty 1

## 2021-12-28 NOTE — Progress Notes (Signed)
Physical Therapy Treatment ?Patient Details ?Name: Patricia Dudley ?MRN: [id number] ?DOB: 1930-07-06 ?Today's Date: 12/28/2021 ? ? ?History of Present Illness Pt is a 88 y/o female presenting on 5/1 with aphasia, L gaze and slurred speech. MRI 5/1 with "positive for acute small vessel infarct at the left internal capsule". CXR 5/1: "Mild airspace disease at the left base, favor atelectasis."PMH includes: afib, CVA, dementia, DM, HTN, GI bleed. ? ?  ?PT Comments  ? ? Pt demos improved sitting balance today and is able to successfully stand with RW and mod A.  Unfortunately, pt remains unable to coordinate taking steps and requires mod A with PT standing in front of pt in order to pivot to chair.  Participation in therex remains variable, with pt following some TC/VC's with certain exercises and not participating at all for other exercises.  Pt remains nonverbal throughout entirety of session despite communicating with PT intermittently yesterday.  Plan will remain for CIR if approved by insurance.   ?Recommendations for follow up therapy are one component of a multi-disciplinary discharge planning process, led by the attending physician.  Recommendations may be updated based on patient status, additional functional criteria and insurance authorization. ? ?Follow Up Recommendations ? Acute inpatient rehab (3hours/day) (If not approved, SNF placement) ?  ?  ?Assistance Recommended at Discharge Frequent or constant Supervision/Assistance  ?Patient can return home with the following A lot of help with walking and/or transfers;A lot of help with bathing/dressing/bathroom;Assistance with feeding;Assistance with cooking/housework;Assist for transportation;Direct supervision/assist for financial management;Help with stairs or ramp for entrance;Direct supervision/assist for medications management ?  ?Equipment Recommendations ? Rolling walker (2 wheels);BSC/3in1 (May need w/c depending on level of function at conclusion of  rehab)  ?  ?Recommendations for Other Services   ? ? ?  ?Precautions / Restrictions Precautions ?Precautions: Fall ?Restrictions ?Weight Bearing Restrictions: No  ?  ? ?Mobility ? Bed Mobility ?Overal bed mobility: Needs Assistance ?Bed Mobility: Supine to Sit ?  ?  ?Supine to sit: Mod assist ?  ?  ?General bed mobility comments: Pt transfers from supine to sitting EOB with mod A for trunk support and LE negotiation.  Use of chuck pad to scoot forward to EOB.  Demos improved sitting balance today with B feet supported on floor and dec L lateral lean.  Able to maintain positioning independently for longer periods of time, including breifly lifting B UEs or LEs off of floor with out LOB.  Does demo posterior LOB x 2; posterior LOB appears more related to fatigue in positioning than related to any UE/LE movement. ?  ? ?Transfers ?Overall transfer level: Needs assistance ?Equipment used: Rolling walker (2 wheels) ?Transfers: Sit to/from Stand ?Sit to Stand: Mod assist ?Stand pivot transfers: Max assist ?  ?  ?  ?  ?General transfer comment: Pt performs sit > stand x 2 today.  1st attempt with a RW and mod A.  Pt demos fair standing balance.  PT encourages pt to take steps, but pt only able to minimally lift each extremity lacking full foot clearance and unable to advance either foot forward.  PT has pt return to sitting EOB.  PT then has pt perform 2nd attempt with PT in front in order to complete pivot to chair.  Pt requires max A today secondary to pt grabbing chair handle and refusing to let go resulting in inc difficulty with pivot transfer. ?  ? ?Ambulation/Gait ?  ?  ?  ?  ?  ?  ?  ?  ? ? ?  Stairs ?  ?  ?  ?  ?  ? ? ?Wheelchair Mobility ?  ? ?Modified Rankin (Stroke Patients Only) ?  ? ? ?  ?Balance Overall balance assessment: Needs assistance ?  ?Sitting balance-Leahy Scale: Fair ?  ?Postural control: Posterior lean ?Standing balance support: Bilateral upper extremity supported, During functional  activity ?Standing balance-Leahy Scale: Poor ?  ?  ?  ?  ?  ?  ?  ?  ?  ?  ?  ?  ?  ? ?  ?Cognition Arousal/Alertness: Lethargic ?Behavior During Therapy: Flat affect ?Overall Cognitive Status: Difficult to assess ?  ?  ?  ?  ?  ?  ?  ?  ?  ?  ?  ?  ?  ?  ?  ?  ?General Comments: Pt is supine in bed sleeping soundly when PT arrives.  Difficult to arouse, will open eyes and then drift back off to sleep.  Daughter is present and agreeable for PT to attempt mobilizing pt despite pt fatigue.  Pt remains nonverbal during session today (was able to communicate breifly yesterday) ?  ?  ? ?  ?Exercises General Exercises - Lower Extremity ?Ankle Circles/Pumps: AROM, Both, 20 reps ?Heel Slides: AAROM, Both, 20 reps ?Hip ABduction/ADduction: AAROM, Both, 20 reps ?Straight Leg Raises: AAROM, Both, 20 reps ?Other Exercises ?Other Exercises: Pt demos independence with small range ankle pumps and LAQs with inc TC/VCs.  Participates minimally with heel slides but does not demo active mm contraction with remaining therex despite VC/TCs. ? ?  ?General Comments   ?  ?  ? ?Pertinent Vitals/Pain Pain Assessment ?Pain Assessment: Faces ?Faces Pain Scale: No hurt  ? ? ?Home Living   ?  ?Available Help at Discharge: Family ?Type of Home: House ?  ?  ?  ?  ?  ?  ?   ?  ?Prior Function    ?  ?  ?   ? ?PT Goals (current goals can now be found in the care plan section) Progress towards PT goals: Progressing toward goals ? ?  ?Frequency ? ? ?   ? ? ? ?  ?PT Plan Current plan remains appropriate  ? ? ?Co-evaluation   ?  ?  ?  ?  ? ?  ?AM-PAC PT "6 Clicks" Mobility   ?Outcome Measure ? Help needed turning from your back to your side while in a flat bed without using bedrails?: A Lot ?Help needed moving from lying on your back to sitting on the side of a flat bed without using bedrails?: A Lot ?Help needed moving to and from a bed to a chair (including a wheelchair)?: A Lot ?Help needed standing up from a chair using your arms (e.g., wheelchair or  bedside chair)?: A Lot ?Help needed to walk in hospital room?: Total ?Help needed climbing 3-5 steps with a railing? : Total ?6 Click Score: 10 ? ?  ?End of Session Equipment Utilized During Treatment: Gait belt ?Activity Tolerance: Patient tolerated treatment well ?Patient left: in chair;with family/visitor present;with chair alarm set ?  ?  ?  ? ? ?Time: 2778-2423 ?PT Time Calculation (min) (ACUTE ONLY): 26 min ? ?Charges:  $Therapeutic Exercise: 8-22 mins ?$Therapeutic Activity: 8-22 mins          ?          ? ?Esabella Stockinger A. Bishop Vanderwerf, PT, DPT ?Acute Rehabilitation Services ?Office: [phone]  ? ? ?[place] ?12/28/2021, 11:13 AM ? ?

## 2021-12-28 NOTE — Progress Notes (Signed)
Speech Language Pathology Treatment: Dysphagia  ?Patient Details ?Name: Patricia Dudley ?MRN: [id number] ?DOB: Dec 28, 1929 ?Today's Date: 12/28/2021 ?Time: 5284-1324 ?SLP Time Calculation (min) (ACUTE ONLY): 20 min ? ?Assessment / Plan / Recommendation ?Clinical Impression ? Pt seen for skilled SLP regarding po tolerance and indication for diet modifications or instrumental evaluation.  Meal tray has not arrived yet, thus she was observed with liquids.  Today pt able to hold her cup to take juice via straw and water from daughter via straw.  She needs supervision to assure she takes small boluses -  cough observed post swallow of thin water - that was not observed with applejuice. Suspect slightly thicker liquid improves tolerance. Continued eructation noted - but did not elicit cough following.  Daughter, Patricia Dudley, reports pt tolerated her solid breakfast well yesterday without issue, oral pocketing, excessive cough, etc.   Recommend continue diet - have family order meals for pt to assure puree meats/extra gravy/sauces be sent- wrote sign in room to trigger family.  Also advised pt consume slightly thicker drinks with meals, medications,etc if decreases cough and increases comfort. Will follow up briefly on acute re: swallowing function, abilities, family education, etc.  Pt has made good progress with intake/po tolerance and family education ongoing. ?  ?HPI HPI: Patricia Dudley is a 88 y.o. female who presented to [facility] ED via EMS for aphasia, left gaze and slurred speech. Pt with recent episondes of vomiting at home and in ED. MRI 5/2: "positive for acute small vessel infarct at the left internal capsule." CXR 5/1: "Mild airspace disease at the left base, favor atelectasis."  Pt  with past medical history of HTN, HLD, A fib not on AC, GI bleed, dementia, CVA, hypothyroidism and DM. ?  ?   ?SLP Plan ? Continue with current plan of care ? ?Patient needs continued Speech Lanaguage Pathology Services ?   ?Recommendations for follow up therapy are one component of a multi-disciplinary discharge planning process, led by the attending physician.  Recommendations may be updated based on patient status, additional functional criteria and insurance authorization. ?  ? ?Recommendations  ?Diet recommendations: Dysphagia 3 (mechanical soft);Thin liquid ?Liquids provided via: Cup;Straw ?Medication Administration: Whole meds with puree (can try whole with puree or ice cream) ?Supervision: Patient able to self feed;Full supervision/cueing for compensatory strategies;Staff to assist with self feeding ?Compensations: Slow rate;Small sips/bites;Minimize environmental distractions ?Postural Changes and/or Swallow Maneuvers: Seated upright 90 degrees;Upright 30-60 min after meal  ?   ?    ?   ? ? ? ? Oral Care Recommendations: Oral care BID ?Follow Up Recommendations: Follow physician's recommendations for discharge plan and follow up therapies ?Assistance recommended at discharge: Frequent or constant Supervision/Assistance ?SLP Visit Diagnosis: Dysphagia, unspecified (R13.10);Dysphagia, oral phase (R13.11) ?Plan: Continue with current plan of care ? ? ? ? ?  ?  ?Kathleen Lime, MS CCC SLP ?Acute Rehab Services ?Office [phone] ?Pager [phone] ? ? ?Macario Golds ? ?12/28/2021, 10:08 AM ?

## 2021-12-28 NOTE — Progress Notes (Signed)
?PROGRESS NOTE ? ? ? Patricia Dudley  MGQ:[id number] DOB: 03-09-1930 DOA: 12/24/2021 ?PCP: Tracie Harrier, MD  ? ?Chief Complaint  ?Patient presents with  ? Code Stroke  ? ? ?Brief Narrative:  ? ?14 female with past medical history of CKD stage III B, hypothyroidism, paroxysmal A-fib on anticoagulation due to to GI bleed, diabetes mellitus, hypertension, Hypothyroid, Prior GI Bleed 04/2017 2/2 AVM duodenum [tx 3 u PRBC], diabetes mellitus, CVA , she presents with aphasia, left gaze, slurred speech, and vomiting, work-up significant for acute CVA, admitted for further work-up. ?  ? ?Assessment & Plan: ?  ?Principal Problem: ?  PNA (pneumonia) ?Active Problems: ?  Cerebral thrombosis with cerebral infarction ?  Malnutrition of moderate degree ? ?Acute CVA ?- left BG/PLIC small infarct secondary to small vessel disease source given the patient, however cardioembolic cannot be completely ruled out given history of A-fib not on [facility] ?-MRI brain significant for left basal ganglia/intracapsular small infarct ?-MRA unremarkable ?-Carotid Doppler unremarkable ?-2D echo with EF 60 to 65% ?-LDL at 110-A1c at 6.5 ?-Not on any antithrombotic therapy prior to admission, now on aspirin 81 mg daily, unfortunately she is not a good candidate for dual antiplatelet therapy due to history of severe GI bleed. ?-Recommendation for CIR. ?-Allow for permissive hypertension initially, she is being resumed gradually on her meds ? ? ?Underlying A-fib  ?- not on anticoagulation secondary to GI bleeds ?-Started on aspirin due to CVA. ?-As well she is started on low-dose metoprolol for heart rate control, dose has been increased today given elevated heart rate. ? ?Aspiration  pneumonia ?-Chest x-ray significant for left lung base pneumonia ?-Continue with IV Rocephin and azithromycin ?-Off on dysphagia 3 diet, SLP evaluation greatly appreciated, she has been downgraded to dysphagia 1 with thin liquid. ? ?Acute metabolic encephalopathy due to  above ?-Followed closely by PT/OT/SLP ?-Patient has been gradually improving, as well her dysphagia, she was advanced to dysphagia 2 with thin liquid ? ?Acute kidney injury ?-Due to hypovolemia/prerenal, given dehydration with ACEs/Demadex use ?-Improving with hydration ? ?Hypernatremia  ?-Change fluid to D5 half-normal saline. ? ?Prior GI bleed 2018 secondary to AVMs ?Poor candidacy for anticoagulation may be a candidate for antiplatelet-defer to neurology ? ?Hypothyroid ?-Continue with Synthroid, she remains on IV, change to p.o. when oral intake is more reliable ? 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 ?Hypertension ?-Continue to hold meds to allow for permissive hypertension ? ?Hyperlipidemia ?-LDL is 110, started on Crestor ? ? ?  ? ?DVT prophylaxis: [place] heparin ?Code Status: DNR ?Family Communication: Daughter at bedside ?Disposition: CIR ? ?Status is: Inpatient ?Remains inpatient appropriate ?  ?Consultants:  ?Neurology ? ?Subjective: ? ?The events overnight as discussed with staff, daughter at bedside, patient denies any complaints today.  Daughter reports appetite has improved. ? ? ? ?Objective: ?Vitals:  ? 12/28/21 0327 12/28/21 0843 12/28/21 1245 12/28/21 1500  ?BP: (!) 176/68 (!) 189/78 (!) 194/73 (!) 168/92  ?Pulse: 91 91 81 85  ?Resp: 20 (!) 24 18 14   ?Temp: 97.8 ?F (36.6 ?C) (!) 97.3 ?F (36.3 ?C) 97.6 ?F (36.4 ?C) 97.6 ?F (36.4 ?C)  ?TempSrc: Axillary Axillary Axillary Axillary  ?SpO2: 98% 97% 92% 97%  ?Weight:      ?Height:      ? ? ?Intake/Output Summary (Last 24 hours) at 12/28/2021 1625 ?Last data filed at 12/28/2021 0019 ?Gross per 24  hour  ?Intake --  ?Output 800 ml  ?Net -800 ml  ? ?Filed Weights  ? 12/24/21 1437 12/24/21 1453 12/25/21 1446  ?Weight: 59.8 kg 59.4 kg 56.8 kg  ? ? ?Examination: ? ?She is more awake and appropriate today, frail, deconditioned, follows simple commands,  answering yes/no questions, remains confused ?Symmetrical Chest wall movement, Good air movement bilaterally, CTAB ?RRR,No Gallops,Rubs or new Murmurs, No Parasternal Heave ?+ve B.Sounds, Abd Soft, No tenderness, No rebound - guarding or rigidity. ?No Cyanosis, Clubbing or edema, No new Rash or bruise   ? ? ? ? ? ? ?Data Reviewed: I have personally reviewed following labs and imaging studies ? ?CBC: ?Recent Labs  ?Lab 12/24/21 ?1436 12/24/21 ?1438 12/25/21 ?0530 12/26/21 ?0106 12/27/21 ?1017  ?WBC 16.0*  --  18.4* 11.8* 8.5  ?NEUTROABS 12.2*  --   --   --   --   ?HGB 12.1 12.2 10.5* 10.7* 10.0*  ?HCT 35.9* 36.0 32.0* 33.2* 30.3*  ?MCV 82.7  --  84.7 84.3 83.7  ?PLT 313  --  251 238 218  ? ? ?Basic Metabolic Panel: ?Recent Labs  ?Lab 12/24/21 ?1436 12/24/21 ?1438 12/25/21 ?0530 12/26/21 ?0106 12/27/21 ?5102  ?NA 144 146* 149* 148* 143  ?K 4.5 4.5 4.2 3.7 3.6  ?CL 111 112* 120* 116* 113*  ?CO2 23  --  24 23 23   ?GLUCOSE 154* 161* 129* 109* 144*  ?BUN 27* 36* 21 15 14   ?CREATININE 2.33* 2.40* 2.05* 1.86* 1.92*  ?CALCIUM 9.1  --  8.2* 8.3* 8.2*  ? ? ?GFR: ?Estimated Creatinine Clearance: 17.8 mL/min (A) (by C-G formula based on SCr of 1.92 mg/dL (H)). ? ?Liver Function Tests: ?Recent Labs  ?Lab 12/24/21 ?1436 12/26/21 ?0106  ?AST 25 38  ?ALT 11 23  ?ALKPHOS 56 49  ?BILITOT 0.8 0.6  ?PROT 7.1 6.2*  ?ALBUMIN 3.6 2.8*  ? ? ?CBG: ?Recent Labs  ?Lab 12/27/21 ?5852 12/27/21 ?1146 12/27/21 ?1612 12/28/21 ?7782 12/28/21 ?1259  ?GLUCAP 149* 178* 160* 135* 171*  ? ? ? ?Recent Results (from the past 240 hour(s))  ?Resp Panel by RT-PCR (Flu A&B, Covid) Peripheral     Status: None  ? Collection Time: 12/24/21  2:30 PM  ? Specimen: Peripheral; Nasopharyngeal(NP) swabs in vial transport medium  ?Result Value Ref Range Status  ? SARS Coronavirus 2 by RT PCR NEGATIVE NEGATIVE Final  ?  Comment: (NOTE) ?SARS-CoV-2 target nucleic acids are NOT DETECTED. ? ?The SARS-CoV-2 RNA is generally detectable in upper respiratory ?specimens during  the acute phase of infection. The lowest ?concentration of SARS-CoV-2 viral copies this assay can detect is ?138 copies/mL. A negative result does not preclude SARS-Cov-2 ?infection and should not be used as the sole basis for treatment or ?other patient management decisions. A negative result may occur with  ?improper specimen collection/handling, submission of specimen other ?than nasopharyngeal swab, presence of viral mutation(s) within the ?areas targeted by this assay, and inadequate number of viral ?copies(<138 copies/mL). A negative result must be combined with ?clinical observations, patient history, and epidemiological ?information. The expected result is Negative. ? ?Fact Sheet for Patients:  ?EntrepreneurPulse.com.au ? ?Fact Sheet for Healthcare Providers:  ?IncredibleEmployment.be ? ?This test is no t yet approved or cleared by the [place] FDA and  ?has been authorized for detection and/or diagnosis of SARS-CoV-2 by ?FDA under an Emergency Use Authorization (EUA). This EUA will remain  ?in effect (meaning this test can be used) for the duration of the ?COVID-19 declaration under Section  564(b)(1) of the Act, 21 ?U.S.C.section 360bbb-3(b)(1), unless the authorization is terminated  ?or revoked sooner.  ? ? ?  ? Influenza A by PCR NEGATIVE NEGATIVE Final  ? Influenza B by PCR NEGATIVE NEGATIVE Final  ?  Comment: (NOTE) ?The Xpert Xpress SARS-CoV-2/FLU/RSV plus assay is intended as an aid ?in the diagnosis of influenza from Nasopharyngeal swab specimens and ?should not be used as a sole basis for treatment. Nasal washings and ?aspirates are unacceptable for Xpert Xpress SARS-CoV-2/FLU/RSV ?testing. ? ?Fact Sheet for Patients: ?EntrepreneurPulse.com.au ? ?Fact Sheet for Healthcare Providers: ?IncredibleEmployment.be ? ?This test is not yet approved or cleared by the [place] FDA and ?has been authorized for detection and/or  diagnosis of SARS-CoV-2 by ?FDA under an Emergency Use Authorization (EUA). This EUA will remain ?in effect (meaning this test can be used) for the duration of the ?COVID-19 declaration under Section 564(b)(1) of t

## 2021-12-28 NOTE — Progress Notes (Signed)
Inpatient Rehab Admissions Coordinator:  ? ?Awaiting determination from [facility] regarding CIR prior auth request.   ? ?Shann Medal, PT, DPT ?Admissions Coordinator ?[phone] ?12/28/21  ?10:00 AM ? ?

## 2021-12-28 NOTE — Evaluation (Signed)
Speech Language Pathology Evaluation ?Patient Details ?Name: Patricia Dudley ?MRN: [id number] ?DOB: [birth year] ?Today's Date: 12/28/2021 ?Time: 2993-7169 ?SLP Time Calculation (min) (ACUTE ONLY): 25 min ? ?Problem List:  ?Patient Active Problem List  ? Diagnosis Date Noted  ? Malnutrition of moderate degree 12/27/2021  ? PNA (pneumonia) 12/24/2021  ? Cerebral thrombosis with cerebral infarction 12/24/2021  ? Anemia, unspecified 06/13/2017  ? Diabetic nephropathy ([place]) 06/13/2017  ? GERD (gastroesophageal reflux disease) 06/13/2017  ? Hyperlipidemia 06/13/2017  ? Hypertension 06/13/2017  ? Osteoporosis 06/13/2017  ? Acute posthemorrhagic anemia   ? Gastritis without bleeding   ? Angiodysplasia of stomach and duodenum with hemorrhage   ? Acute GI bleeding 02/27/2017  ? CVA (cerebral vascular accident) ([place]) 12/20/2016  ? Hypertensive urgency 12/19/2016  ? GI bleed 08/07/2016  ? Type 2 diabetes mellitus with stage 3 chronic kidney disease, without long-term current use of insulin ([place]) 11/01/2015  ? Chronic a-fib ([place]) 09/01/2015  ? Malignant essential hypertension 07/26/2015  ? Renal insufficiency 07/26/2015  ? TIA (transient ischemic attack) 07/26/2015  ? Vertigo 07/26/2015  ? Acquired hypothyroidism 07/04/2015  ? ?Past Medical History:  ?Past Medical History:  ?Diagnosis Date  ? A-fib ([place])   ? CVA (cerebral vascular accident) [facility])   ? Dementia ([place])   ? Diabetes mellitus without complication ([place])   ? GI bleed   ? Hyperlipemia   ? Hypertension   ? Thyroid disease   ? ?Past Surgical History:  ?Past Surgical History:  ?Procedure Laterality Date  ? COLONOSCOPY N/A 05/16/2017  ? Procedure: COLONOSCOPY;  Surgeon: Lin Landsman, MD;  Location: [facility] ENDOSCOPY;  Service: Gastroenterology;  Laterality: N/A;  ? ESOPHAGOGASTRODUODENOSCOPY N/A 08/08/2016  ? Procedure: ESOPHAGOGASTRODUODENOSCOPY (EGD);  Surgeon: [name], MD;  Location: [facility] ENDOSCOPY;  Service: Endoscopy;  Laterality: N/A;  ?  ESOPHAGOGASTRODUODENOSCOPY N/A 05/16/2017  ? Procedure: ESOPHAGOGASTRODUODENOSCOPY (EGD);  Surgeon: Lin Landsman, MD;  Location: [facility] ENDOSCOPY;  Service: Gastroenterology;  Laterality: N/A;  ? ESOPHAGOGASTRODUODENOSCOPY (EGD) WITH PROPOFOL N/A 02/28/2017  ? Procedure: ESOPHAGOGASTRODUODENOSCOPY (EGD) WITH PROPOFOL;  Surgeon: [name], MD;  Location: [facility] ENDOSCOPY;  Service: Endoscopy;  Laterality: N/A;  ? EYE SURGERY    ? left  ? ?HPI:  ?Patricia Dudley is a 88 y.o. female who presented to [facility] ED via EMS for aphasia, left gaze and slurred speech. Pt with recent episondes of vomiting at home and in ED. MRI 5/2: "positive for acute small vessel infarct at the left internal capsule." CXR 5/1: "Mild airspace disease at the left base, favor atelectasis."  Pt  with past medical history of HTN, HLD, A fib not on AC, GI bleed, dementia, CVA, hypothyroidism and DM.  ? ?Assessment / Plan / Recommendation ?Clinical Impression ? Cognitive linguistic evaluation conducted per daughter's desire - [name] does report that pt does not verbalize much at home as her needs are often anticipated.  Today, pt often stares ahead to SLP without consistent participation- when she does attempt, responses are significantly delayed up to 30 seconds- inconsistently benefiting from repetition and visual cues.  Per daughter, a nephew was killed a few years ago *who lived with pt* , which caused pt to communicate less.  Severe cognitive deficits suspected exacerbating potential language deficits.  She only answered SLP in single words and only responded approx 20% of opportunities.  Articulation is clear when pt speaks.  Yes/no questions answered correctly for name but not relation to daughter nor location.  Advised daughter to recommendation for trial SLP given her  report of new changes with this event.   Reviewed goal of establishing accurate yes/no to assure pt's needs are met and using multimodal communication with and from patient.   Recommend follow up at next venue of care re: language/cognition to maximize rehab and decrease caregiver burden. ?   ?SLP Assessment ? SLP Recommendation/Assessment: Patient needs continued [place] [facility] Services ?SLP Visit Diagnosis: Cognitive communication deficit (R41.841)  ?  ?Recommendations for follow up therapy are one component of a multi-disciplinary discharge planning process, led by the attending physician.  Recommendations may be updated based on patient status, additional functional criteria and insurance authorization. ?   ?Follow Up Recommendations ? Follow physician's recommendations for discharge plan and follow up therapies  ?  ?Assistance Recommended at Discharge ? Frequent or constant Supervision/Assistance  ?Functional Status Assessment Patient has had a recent decline in their functional status and/or demonstrates limited ability to make significant improvements in function in a reasonable and predictable amount of time  ?Frequency and Duration    ? Follow up for language/cog at next venue of care ?  ?   ?SLP Evaluation ?Cognition ? Overall Cognitive Status: Impaired/Different from baseline ?Arousal/Alertness: Awake/alert ?Orientation Level: Oriented to person;Disoriented to place;Disoriented to time;Disoriented to situation ?Problem Solving: Impaired ?Safety/Judgment: Impaired  ?  ?   ?Comprehension ? Auditory Comprehension ?Overall Auditory Comprehension: Impaired ?Yes/No Questions: Impaired ?Basic Biographical Questions: 51-75% accurate ?Basic Immediate Environment Questions: 25-49% accurate ?Commands: Impaired ?One Step Basic Commands: 0-24% accurate ?Interfering Components: Attention;Motor planning;Working memory ?EffectiveTechniques: Extra processing time ?Reading Comprehension ?Reading Status: Impaired (pt able to read single words and correctly identify item name from field of 2) ?Word level: Impaired ?Sentence Level: Not tested ?Paragraph Level: Not tested ?Functional  Environmental (signs, name badge): Not tested ?Interfering Components: Attention ?Effective Techniques: Large print;Eye glasses  ?  ?Expression Expression ?Primary Mode of Expression: Verbal ?Verbal Expression ?Overall Verbal Expression: Impaired ?Initiation: Impaired ?Automatic Speech: Singing;Name ?Repetition: Impaired ?Level of Impairment: Word level ?Naming: Impairment ?Effective Techniques: Sentence completion ?Non-Verbal Means of Communication: Not applicable ?Written Expression ?Dominant Hand: Right ?Written Expression: Not tested   ?Oral / Motor ? Oral Motor/Sensory Function ?Overall Oral Motor/Sensory Function:  (did not follow directions but able to form labial seal on straw and spoon; appears with right facial asymmetry) ?Motor Speech ?Overall Motor Speech: Appears within functional limits for tasks assessed ?Respiration: Within functional limits ?Phonation: Normal ?Resonance: Within functional limits ?Articulation: Within functional limitis (when pt verbalizes) ?Intelligibility: Intelligible (when pt speaks) ?Motor Planning: Witnin functional limits   ? Kathleen Lime, MS CCC SLP ?Acute Rehab Services ?Office [phone] ?Pager [phone] ?       ? ?Macario Golds ?12/28/2021, 9:58 AM ? ?

## 2021-12-28 NOTE — Progress Notes (Signed)
Speech Language Pathology Treatment: Dysphagia  ?Patient Details ?Name: Patricia Dudley ?MRN: [id number] ?DOB: [birth year] ?Today's Date: 12/28/2021 ?Time: 6861-6837 ?SLP Time Calculation (min) (ACUTE ONLY): 14 min ? ?Assessment / Plan / Recommendation ?Clinical Impression ? New orders received for swallow assessment given pt's daughters' concerns regarding her coughing with dysphagia 3 diet today.  Pt was sleeping soundly and her family expressed preference that she be left to rest. They reported that she had difficulty chewing her food today; we discussed the advantages of downgrading to dysphagia 1 for now and they acknowledged their preference for that diet.  Sign changed at [facility] and new orders written.   ? ?Pt for potential for D/C to AIR. Will benefit from f/u SLP in next venue of care. ? ?  ?HPI HPI: Patricia Dudley is a 88 y.o. female who presented to [facility] ED via EMS for aphasia, left gaze and slurred speech. Pt with recent episondes of vomiting at home and in ED. MRI 5/2: "positive for acute small vessel infarct at the left internal capsule." CXR 5/1: "Mild airspace disease at the left base, favor atelectasis."  Pt  with past medical history of HTN, HLD, A fib not on AC, GI bleed, dementia, CVA, hypothyroidism and DM. ?  ?   ?SLP Plan ? Continue with current plan of care ? ?  ?  ?Recommendations for follow up therapy are one component of a multi-disciplinary discharge planning process, led by the attending physician.  Recommendations may be updated based on patient status, additional functional criteria and insurance authorization. ?  ? ?Recommendations  ?Diet recommendations: Dysphagia 1 (puree);Thin liquid ?Liquids provided via: Cup;Straw ?Medication Administration: Crushed with puree ?Supervision: Staff to assist with self feeding ?Compensations: Slow rate;Small sips/bites;Minimize environmental distractions ?Postural Changes and/or Swallow Maneuvers: Seated upright 90 degrees;Upright 30-60 min after meal   ?   ?    ?   ? ? ? ? Oral Care Recommendations: Oral care BID ?Follow Up Recommendations: Follow physician's recommendations for discharge plan and follow up therapies ?Assistance recommended at discharge: Frequent or constant Supervision/Assistance ?SLP Visit Diagnosis: Dysphagia, unspecified (R13.10) ?Plan: Continue with current plan of care ? ? ? ? ?  ?  ? ?Patricia Yazdani L. Patricia Lemar, MA CCC/SLP ?Acute Rehabilitation Services ?Office number [phone] ?Pager [phone] ? ?Patricia Dudley ? ?12/28/2021, 4:46 PM ?

## 2021-12-29 DIAGNOSIS — I4729 Other ventricular tachycardia: Secondary | ICD-10-CM

## 2021-12-29 DIAGNOSIS — I6330 Cerebral infarction due to thrombosis of unspecified cerebral artery: Secondary | ICD-10-CM | POA: Diagnosis not present

## 2021-12-29 DIAGNOSIS — I639 Cerebral infarction, unspecified: Secondary | ICD-10-CM

## 2021-12-29 DIAGNOSIS — J189 Pneumonia, unspecified organism: Secondary | ICD-10-CM | POA: Diagnosis not present

## 2021-12-29 LAB — CULTURE, BLOOD (ROUTINE X 2)
Culture: NO GROWTH
Culture: NO GROWTH
Special Requests: ADEQUATE

## 2021-12-29 LAB — BASIC METABOLIC PANEL
Anion gap: 5 (ref 5–15)
BUN: 15 mg/dL (ref 8–23)
CO2: 26 mmol/L (ref 22–32)
Calcium: 8.6 mg/dL — ABNORMAL LOW (ref 8.9–10.3)
Chloride: 109 mmol/L (ref 98–111)
Creatinine, Ser: 1.76 mg/dL — ABNORMAL HIGH (ref 0.44–1.00)
GFR, Estimated: 27 mL/min — ABNORMAL LOW (ref >60)
Glucose, Bld: 122 mg/dL — ABNORMAL HIGH (ref 70–99)
Potassium: 3.8 mmol/L (ref 3.5–5.1)
Sodium: 140 mmol/L (ref 135–145)

## 2021-12-29 LAB — GLUCOSE, CAPILLARY
Glucose-Capillary: 139 mg/dL — ABNORMAL HIGH (ref 70–99)
Glucose-Capillary: 188 mg/dL — ABNORMAL HIGH (ref 70–99)
Glucose-Capillary: 161 mg/dL — ABNORMAL HIGH (ref 70–99)

## 2021-12-29 LAB — MAGNESIUM: Magnesium: 1.8 mg/dL (ref 1.7–2.4)

## 2021-12-29 MED ORDER — MAGNESIUM SULFATE IN D5W 1-5 GM/100ML-% IV SOLN
1.0000 g | Freq: Once | INTRAVENOUS | Status: AC
  Administered 2021-12-29: 1 g via INTRAVENOUS
  Filled 2021-12-29 (×2): qty 100

## 2021-12-29 MED ORDER — POTASSIUM CHLORIDE CRYS ER 20 MEQ PO TBCR
40.0000 meq | EXTENDED_RELEASE_TABLET | Freq: Once | ORAL | Status: AC
  Administered 2021-12-29: 40 meq via ORAL
  Filled 2021-12-29: qty 2

## 2021-12-29 MED ORDER — AMLODIPINE BESYLATE 5 MG PO TABS
5.0000 mg | ORAL_TABLET | Freq: Every day | ORAL | Status: DC
Start: 2021-12-29 — End: 2021-12-30
  Administered 2021-12-29: 5 mg via ORAL
  Filled 2021-12-29: qty 1

## 2021-12-29 NOTE — Progress Notes (Signed)
Pt had a 14 run beat of Vtach, pt is sleeping, current vital signs SBP 164/80 HR 77, RR 28 SPO2 95% on RA. Provider Inda Merlin MD notified. No new orders.  ?

## 2021-12-29 NOTE — Progress Notes (Signed)
?PROGRESS NOTE ? ? ? Patricia Dudley  MVE:[id number] DOB: 1929/09/10 DOA: 12/24/2021 ?PCP: Tracie Harrier, MD  ? ?Chief Complaint  ?Patient presents with  ? Code Stroke  ? ? ?Brief Narrative:  ? 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 ?  ? ?Assessment & Plan: ?  ?Principal Problem: ?  PNA (pneumonia) ?Active Problems: ?  Cerebral thrombosis with cerebral infarction ?  Malnutrition of moderate degree ? ?Acute CVA ?- left BG/PLIC small infarct secondary to small vessel disease source given the patient, however cardioembolic cannot be completely ruled out given history of A-fib not on [facility] ?-MRI brain significant for left basal ganglia/intracapsular small infarct ?-MRA unremarkable ?-Carotid Doppler unremarkable ?-2D echo with EF 60 to 65% ?-LDL at 110-A1c at 6.5 ?-Not on any antithrombotic therapy prior to admission, now on aspirin 81 mg daily, unfortunately she is not a good candidate for dual antiplatelet therapy due to history of severe GI bleed. ?-Recommendation for CIR. ?-Allow for permissive hypertension initially, she is being resumed gradually on her meds ? ? ?Underlying A-fib  ?- not on anticoagulation secondary to GI bleeds ?-Started on aspirin due to CVA. ?-As well she is started on low-dose metoprolol for heart rate control, has been uptitrated gradually, heart rate is currently controlled.  ? ?Aspiration  pneumonia ?-Chest x-ray significant for left lung base pneumonia ?-Treated  with IV Rocephin and azithromycin ?-Off on dysphagia 3 diet, SLP evaluation greatly appreciated, she has been downgraded to dysphagia 1 with thin liquid. ? ?Acute metabolic  encephalopathy due to above ?-Followed closely by PT/OT/SLP ?-Patient has been gradually improving, as well her dysphagia, she was advanced to dysphagia 2 with thin liquid ? ?Acute kidney injury ?-Due to hypovolemia/prerenal, given dehydration with ACEs/Demadex use ?-Improving with hydration ? ?Hypernatremia  ?-Change fluid to D5 half-normal saline. ? ?Prior GI bleed 2018 secondary to AVMs ?Poor candidacy for anticoagulation may be a candidate for antiplatelet-defer to neurology ? ?Hypothyroid ?-Continue with Synthroid, she remains on IV, change to p.o. when oral intake is more reliable ? 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 ?Hypertension ?-Antihypertensive medication has been reintroduced gradually ?-Blood pressure remains elevated, will resume back on home dose VASc ? ?NSVT ?-Patient with 14 beats of nonsustained V. tach overnight, keep potassium >4 , Mg > 2, already on beta-blockers, recent echo with a preserved EF. ? ?Hyperlipidemia ?-LDL is 110, started on Crestor ? ? ?  ? ?DVT prophylaxis: [place] heparin ?Code Status: DNR ?Family Communication: Daughter at bedside ?Disposition: CIR ? ?Status is: Inpatient ?Remains inpatient appropriate ?  ?Consultants:  ?Neurology ? ?Subjective: ? ?The events overnight as discussed with staff, daughter at bedside, patient denies any complaints today.  Daughter reports appetite has improved. ? ? ? ?Objective: ?Vitals:  ? 12/29/21 0107 12/29/21 0332 12/29/21 0800 12/29/21 1212  ?BP:  (!) 164/69 126/81 (!) 158/92  ?Pulse: 85 91 86 73  ?Resp: 17 20 18 14   ?Temp:  97.8 ?F (36.6 ?C) (!) 97.5 ?F (36.4 ?C) 98 ?F (36.7 ?C)  ?TempSrc:  Oral Axillary Oral  ?SpO2: 98% 97% 98% 96%  ?  Weight:      ?Height:      ? ? ?Intake/Output Summary (Last 24 hours) at 12/29/2021 1243 ?Last data filed at 12/29/2021 1145 ?Gross per 24 hour  ?Intake 240 ml  ?Output 950 ml  ?Net -710 ml  ? ?Filed  Weights  ? 12/24/21 1437 12/24/21 1453 12/25/21 1446  ?Weight: 59.8 kg 59.4 kg 56.8 kg  ? ? ?Examination: ? ?Sleeping Comfortably, in no apparent distress ?Symmetrical Chest wall movement, Good air movement bilaterally, CTAB ?RRR,No Gallops,Rubs or new Murmurs, No Parasternal Heave ?+ve B.Sounds, Abd Soft, No tenderness, No rebound - guarding or rigidity. ?No Cyanosis, Clubbing or edema, No new Rash or bruise   ? ? ? ? ? ? ?Data Reviewed: I have personally reviewed following labs and imaging studies ? ?CBC: ?Recent Labs  ?Lab 12/24/21 ?1436 12/24/21 ?1438 12/25/21 ?0530 12/26/21 ?0106 12/27/21 ?3149  ?WBC 16.0*  --  18.4* 11.8* 8.5  ?NEUTROABS 12.2*  --   --   --   --   ?HGB 12.1 12.2 10.5* 10.7* 10.0*  ?HCT 35.9* 36.0 32.0* 33.2* 30.3*  ?MCV 82.7  --  84.7 84.3 83.7  ?PLT 313  --  251 238 218  ? ? ?Basic Metabolic Panel: ?Recent Labs  ?Lab 12/24/21 ?1436 12/24/21 ?1438 12/25/21 ?0530 12/26/21 ?0106 12/27/21 ?0238 12/29/21 ?0100  ?NA 144 146* 149* 148* 143 140  ?K 4.5 4.5 4.2 3.7 3.6 3.8  ?CL 111 112* 120* 116* 113* 109  ?CO2 23  --  24 23 23 26   ?GLUCOSE 154* 161* 129* 109* 144* 122*  ?BUN 27* 36* 21 15 14 15   ?CREATININE 2.33* 2.40* 2.05* 1.86* 1.92* 1.76*  ?CALCIUM 9.1  --  8.2* 8.3* 8.2* 8.6*  ?MG  --   --   --   --   --  1.8  ? ? ?GFR: ?Estimated Creatinine Clearance: 19.4 mL/min (A) (by C-G formula based on SCr of 1.76 mg/dL (H)). ? ?Liver Function Tests: ?Recent Labs  ?Lab 12/24/21 ?1436 12/26/21 ?0106  ?AST 25 38  ?ALT 11 23  ?ALKPHOS 56 49  ?BILITOT 0.8 0.6  ?PROT 7.1 6.2*  ?ALBUMIN 3.6 2.8*  ? ? ?CBG: ?Recent Labs  ?Lab 12/27/21 ?1612 12/28/21 ?7026 12/28/21 ?1259 12/28/21 ?2105 12/29/21 ?3785  ?GLUCAP 160* 135* 171* 159* 139*  ? ? ? ?Recent Results (from the past 240 hour(s))  ?Resp Panel by RT-PCR (Flu A&B, Covid) Peripheral     Status: None  ? Collection Time: 12/24/21  2:30 PM  ? Specimen: Peripheral; Nasopharyngeal(NP) swabs in vial transport medium  ?Result Value Ref Range Status  ? SARS Coronavirus  2 by RT PCR NEGATIVE NEGATIVE Final  ?  Comment: (NOTE) ?SARS-CoV-2 target nucleic acids are NOT DETECTED. ? ?The SARS-CoV-2 RNA is generally detectable in upper respiratory ?specimens during the acute phase of infection. The lowest ?concentration of SARS-CoV-2 viral copies this assay can detect is ?138 copies/mL. A negative result does not preclude SARS-Cov-2 ?infection and should not be used as the sole basis for treatment or ?other patient management decisions. A negative result may occur with  ?improper specimen collection/handling, submission of specimen other ?than nasopharyngeal swab, presence of viral mutation(s) within the ?areas targeted by this assay, and inadequate number of viral ?copies(<138 copies/mL). A negative result must be combined with ?clinical observations, patient history, and epidemiological ?information. The expected result is Negative. ? ?Fact Sheet for Patients:  ?EntrepreneurPulse.com.au ? ?Fact Sheet for Healthcare Providers:  ?IncredibleEmployment.be ? ?This test is no t yet  approved or cleared by the [place] and  ?has been authorized for detection and/or diagnosis of SARS-CoV-2 by ?FDA under an Emergency Use Authorization (EUA). This EUA will remain  ?in effect (meaning this test can be used) for the duration of the ?COVID-19 declaration under Section 564(b)(1) of the Act, 21 ?U.S.C.section 360bbb-3(b)(1), unless the authorization is terminated  ?or revoked sooner.  ? ? ?  ? Influenza A by PCR NEGATIVE NEGATIVE Final  ? Influenza B by PCR NEGATIVE NEGATIVE Final  ?  Comment: (NOTE) ?The Xpert Xpress SARS-CoV-2/FLU/RSV plus assay is intended as an aid ?in the diagnosis of influenza from Nasopharyngeal swab specimens and ?should not be used as a sole basis for treatment. Nasal washings and ?aspirates are unacceptable for Xpert Xpress SARS-CoV-2/FLU/RSV ?testing. ? ?Fact Sheet for Patients: ?EntrepreneurPulse.com.au ? ?Fact  Sheet for Healthcare Providers: ?IncredibleEmployment.be ? ?This test is not yet approved or cleared by the [place] FDA and ?has been authorized for detection and/or diagnosis of SARS-CoV-2 by ?FDA

## 2021-12-30 DIAGNOSIS — I10 Essential (primary) hypertension: Secondary | ICD-10-CM

## 2021-12-30 DIAGNOSIS — I6330 Cerebral infarction due to thrombosis of unspecified cerebral artery: Secondary | ICD-10-CM | POA: Diagnosis not present

## 2021-12-30 DIAGNOSIS — J189 Pneumonia, unspecified organism: Secondary | ICD-10-CM | POA: Diagnosis not present

## 2021-12-30 LAB — GLUCOSE, CAPILLARY
Glucose-Capillary: 153 mg/dL — ABNORMAL HIGH (ref 70–99)
Glucose-Capillary: 222 mg/dL — ABNORMAL HIGH (ref 70–99)
Glucose-Capillary: 158 mg/dL — ABNORMAL HIGH (ref 70–99)

## 2021-12-30 MED ORDER — LOPERAMIDE HCL 2 MG PO CAPS
2.0000 mg | ORAL_CAPSULE | ORAL | Status: DC | PRN
  Administered 2021-12-30 – 2022-01-03 (×5): 2 mg via ORAL
  Filled 2021-12-30 (×5): qty 1

## 2021-12-30 MED ORDER — AMLODIPINE BESYLATE 10 MG PO TABS
10.0000 mg | ORAL_TABLET | Freq: Every day | ORAL | Status: DC
  Administered 2021-12-30 – 2022-01-03 (×4): 10 mg via ORAL
  Filled 2021-12-30 (×5): qty 1

## 2021-12-30 MED ORDER — METOPROLOL TARTRATE 50 MG PO TABS
50.0000 mg | ORAL_TABLET | Freq: Two times a day (BID) | ORAL | Status: DC
  Administered 2021-12-30 – 2022-01-03 (×8): 50 mg via ORAL
  Filled 2021-12-30 (×9): qty 1

## 2021-12-30 NOTE — Progress Notes (Signed)
?PROGRESS NOTE ? ? ? Patricia Dudley  TWS:[id number] DOB: 1930/03/18 DOA: 12/24/2021 ?PCP: Tracie Harrier, MD  ? ?Chief Complaint  ?Patient presents with  ? Code Stroke  ? ? ?Brief Narrative:  ? 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 ?  ? ?Assessment & Plan: ?  ?Principal Problem: ?  PNA (pneumonia) ?Active Problems: ?  Cerebral thrombosis with cerebral infarction ?  Malnutrition of moderate degree ? ?Acute CVA ?- left BG/PLIC small infarct secondary to small vessel disease source given the patient, however cardioembolic cannot be completely ruled out given history of A-fib not on [facility] ?-MRI brain significant for left basal ganglia/intracapsular small infarct ?-MRA unremarkable ?-Carotid Doppler unremarkable ?-2D echo with EF 60 to 65% ?-LDL at 110-A1c at 6.5 ?-Not on any antithrombotic therapy prior to admission, now on aspirin 81 mg daily, unfortunately she is not a good candidate for dual antiplatelet therapy due to history of severe GI bleed. ?-Recommendation for CIR. ?-Allow for permissive hypertension initially, she is being resumed gradually on her meds ? ? ?Underlying A-fib  ?- not on anticoagulation secondary to GI bleeds ?-Started on aspirin due to CVA. ?-As well she is started on low-dose metoprolol for heart rate control, has been uptitrated gradually, heart rate is currently controlled.  ? ?Aspiration  pneumonia ?-Chest x-ray significant for left lung base pneumonia ?-Treated  with IV Rocephin and azithromycin, transition to Augmentin, no further need for antibiotics, will DC especially with diarrhea. ?-Off on dysphagia 3 diet, SLP evaluation greatly  appreciated, she has been downgraded to dysphagia 1 with thin liquid. ? ?Acute metabolic encephalopathy due to above ?-Followed closely by PT/OT/SLP ?-Patient has been gradually improving, as well her dysphagia, she was advanced to dysphagia 2 with thin liquid ? ?Acute kidney injury ?-Due to hypovolemia/prerenal, given dehydration with ACEs/Demadex use ?-Improving with hydration ? ?Hypernatremia  ?-Change fluid to D5 half-normal saline. ? ?Prior GI bleed 2018 secondary to AVMs ?Poor candidacy for anticoagulation may be a candidate for antiplatelet-defer to neurology ? ?Hypothyroid ?-Continue with Synthroid, she remains on IV, change to p.o. when oral intake is more reliable ? 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 ?Hypertension ?-Antihypertensive medication has been reintroduced gradually ?-Blood pressure remains elevated despite resuming on home dose Norvasc, will increase the dose of both Norvasc and metoprolol. ? ?NSVT ?-Patient with 14 beats of nonsustained V. tach overnight, keep potassium >4 , Mg > 2, already on beta-blockers, recent echo with a preserved EF. ? ?Hyperlipidemia ?-LDL is 110, started on Crestor ? ? ?  ? ?DVT prophylaxis: [place] heparin ?Code Status: DNR ?Family Communication: 3 daughters at bedside ?Disposition: CIR ? ?Status is: Inpatient ?Remains inpatient appropriate ?  ?Consultants:  ?Neurology ? ?Subjective: ? ?Daughter at bedside reports mother had 3 soft BM overnight which interrupted her sleep, otherwise no significant complaints. ? ? ? ?Objective: ?Vitals:  ? 12/30/21 0335 12/30/21 0400 12/30/21 0808 12/30/21 1210  ?BP: (!) 194/71 (!) 155/84 (!) 165/88 (!) 157/75  ?Pulse: 93 80 84 60  ?Resp: 20 16 17 20   ?Temp: 97.8 ?F (36.6 ?C)  97.6 ?  F (36.4 ?C) 98.1 ?F (36.7 ?C)  ?TempSrc: Oral  Oral Oral  ?SpO2: 93% 98% 97% 97%  ?Weight:      ?Height:      ? ? ?Intake/Output Summary (Last 24  hours) at 12/30/2021 1334 ?Last data filed at 12/30/2021 1100 ?Gross per 24 hour  ?Intake 6400.55 ml  ?Output 975 ml  ?Net 5425.55 ml  ? ?Filed Weights  ? 12/24/21 1437 12/24/21 1453 12/25/21 1446  ?Weight: 59.8 kg 59.4 kg 56.8 kg  ? ? ?Examination: ? ?Sleeping Comfortably, in no apparent distress ?Symmetrical Chest wall movement, Good air movement bilaterally, CTAB ?RRR,No Gallops,Rubs or new Murmurs, No Parasternal Heave ?+ve B.Sounds, Abd Soft, No tenderness, No rebound - guarding or rigidity. ?No Cyanosis, Clubbing or edema, No new Rash or bruise   ? ? ? ? ? ? ? ?Data Reviewed: I have personally reviewed following labs and imaging studies ? ?CBC: ?Recent Labs  ?Lab 12/24/21 ?1436 12/24/21 ?1438 12/25/21 ?0530 12/26/21 ?0106 12/27/21 ?8099  ?WBC 16.0*  --  18.4* 11.8* 8.5  ?NEUTROABS 12.2*  --   --   --   --   ?HGB 12.1 12.2 10.5* 10.7* 10.0*  ?HCT 35.9* 36.0 32.0* 33.2* 30.3*  ?MCV 82.7  --  84.7 84.3 83.7  ?PLT 313  --  251 238 218  ? ? ?Basic Metabolic Panel: ?Recent Labs  ?Lab 12/24/21 ?1436 12/24/21 ?1438 12/25/21 ?0530 12/26/21 ?0106 12/27/21 ?0238 12/29/21 ?0100  ?NA 144 146* 149* 148* 143 140  ?K 4.5 4.5 4.2 3.7 3.6 3.8  ?CL 111 112* 120* 116* 113* 109  ?CO2 23  --  24 23 23 26   ?GLUCOSE 154* 161* 129* 109* 144* 122*  ?BUN 27* 36* 21 15 14 15   ?CREATININE 2.33* 2.40* 2.05* 1.86* 1.92* 1.76*  ?CALCIUM 9.1  --  8.2* 8.3* 8.2* 8.6*  ?MG  --   --   --   --   --  1.8  ? ? ?GFR: ?Estimated Creatinine Clearance: 19.4 mL/min (A) (by C-G formula based on SCr of 1.76 mg/dL (H)). ? ?Liver Function Tests: ?Recent Labs  ?Lab 12/24/21 ?1436 12/26/21 ?0106  ?AST 25 38  ?ALT 11 23  ?ALKPHOS 56 49  ?BILITOT 0.8 0.6  ?PROT 7.1 6.2*  ?ALBUMIN 3.6 2.8*  ? ? ?CBG: ?Recent Labs  ?Lab 12/28/21 ?2105 12/29/21 ?0858 12/29/21 ?1308 12/29/21 ?1723 12/30/21 ?8338  ?GLUCAP 159* 139* 188* 161* 153*  ? ? ? ?Recent Results (from the past 240 hour(s))  ?Resp Panel by RT-PCR (Flu A&B, Covid) Peripheral     Status: None  ? Collection Time:  12/24/21  2:30 PM  ? Specimen: Peripheral; Nasopharyngeal(NP) swabs in vial transport medium  ?Result Value Ref Range Status  ? SARS Coronavirus 2 by RT PCR NEGATIVE NEGATIVE Final  ?  Comment: (NOTE) ?SARS-CoV-2 target nucleic acids are NOT DETECTED. ? ?The SARS-CoV-2 RNA is generally detectable in upper respiratory ?specimens during the acute phase of infection. The lowest ?concentration of SARS-CoV-2 viral copies this assay can detect is ?138 copies/mL. A negative result does not preclude SARS-Cov-2 ?infection and should not be used as the sole basis for treatment or ?other patient management decisions. A negative result may occur with  ?improper specimen collection/handling, submission of specimen other ?than nasopharyngeal swab, presence of viral mutation(s) within the ?areas targeted by this assay, and inadequate number of viral ?copies(<138 copies/mL). A negative result must be combined with ?clinical observations, patient history, and epidemiological ?information. The expected result is Negative. ? ?  Fact Sheet for Patients:  ?EntrepreneurPulse.com.au ? ?Fact Sheet for Healthcare Providers:  ?IncredibleEmployment.be ? ?This test is no t yet approved or cleared by the [place] FDA and  ?has been authorized for detection and/or diagnosis of SARS-CoV-2 by ?FDA under an Emergency Use Authorization (EUA). This EUA will remain  ?in effect (meaning this test can be used) for the duration of the ?COVID-19 declaration under Section 564(b)(1) of the Act, 21 ?U.S.C.section 360bbb-3(b)(1), unless the authorization is terminated  ?or revoked sooner.  ? ? ?  ? Influenza A by PCR NEGATIVE NEGATIVE Final  ? Influenza B by PCR NEGATIVE NEGATIVE Final  ?  Comment: (NOTE) ?The Xpert Xpress SARS-CoV-2/FLU/RSV plus assay is intended as an aid ?in the diagnosis of influenza from Nasopharyngeal swab specimens and ?should not be used as a sole basis for treatment. Nasal washings and ?aspirates  are unacceptable for Xpert Xpress SARS-CoV-2/FLU/RSV ?testing. ? ?Fact Sheet for Patients: ?EntrepreneurPulse.com.au ? ?Fact Sheet for Healthcare Providers: ?RingConnections.si

## 2021-12-31 DIAGNOSIS — I6330 Cerebral infarction due to thrombosis of unspecified cerebral artery: Secondary | ICD-10-CM | POA: Diagnosis not present

## 2021-12-31 DIAGNOSIS — J189 Pneumonia, unspecified organism: Secondary | ICD-10-CM | POA: Diagnosis not present

## 2021-12-31 LAB — GLUCOSE, CAPILLARY
Glucose-Capillary: 136 mg/dL — ABNORMAL HIGH (ref 70–99)
Glucose-Capillary: 143 mg/dL — ABNORMAL HIGH (ref 70–99)
Glucose-Capillary: 175 mg/dL — ABNORMAL HIGH (ref 70–99)

## 2021-12-31 MED ORDER — LISINOPRIL 20 MG PO TABS
20.0000 mg | ORAL_TABLET | Freq: Every day | ORAL | Status: DC
  Administered 2021-12-31 – 2022-01-03 (×3): 20 mg via ORAL
  Filled 2021-12-31 (×4): qty 1

## 2021-12-31 MED ORDER — LACTATED RINGERS IV SOLN: INTRAVENOUS | Status: DC

## 2021-12-31 NOTE — Progress Notes (Signed)
Inpatient Rehab Admissions Coordinator:  ? ?Awaiting determination from [facility] regarding CIR prior auth request.   ? ?Shann Medal, PT, DPT ?Admissions Coordinator ?[phone] ?12/31/21  ?10:22 AM ? ?

## 2021-12-31 NOTE — Progress Notes (Signed)
Occupational Therapy Treatment ?Patient Details ?Name: Patricia Dudley ?MRN: [id number] ?DOB: August 06, 1930 ?Today's Date: 12/31/2021 ? ? ?History of present illness Pt is a 88 y/o female presenting on 5/1 with aphasia, L gaze and slurred speech. MRI 5/1 with "positive for acute small vessel infarct at the left internal capsule". CXR 5/1: "Mild airspace disease at the left base, favor atelectasis."PMH includes: afib, CVA, dementia, DM, HTN, GI bleed. ?  ?OT comments ? Pt awakened for therapy, smiling when spoken to, but non verbal throughout session and needing multimodal cues to follow simple commands. Pt needing mod assist for bed mobility and +2 min to mod assist for OOB to [facility] and chair with RW. Pt with decreased initiation. Total assist for toileting. Pt remained up in chair with family at end of session. VSS.  ? ?Recommendations for follow up therapy are one component of a multi-disciplinary discharge planning process, led by the attending physician.  Recommendations may be updated based on patient status, additional functional criteria and insurance authorization. ?   ?Follow Up Recommendations ? Acute inpatient rehab (3hours/day)  ?  ?Assistance Recommended at Discharge Frequent or constant Supervision/Assistance  ?Patient can return home with the following ? Two people to help with walking and/or transfers;Two people to help with bathing/dressing/bathroom;Assistance with cooking/housework;Assistance with feeding;Direct supervision/assist for financial management;Direct supervision/assist for medications management;Assist for transportation;Help with stairs or ramp for entrance ?  ?Equipment Recommendations ? Other (comment) (defer to next venue)  ?  ?Recommendations for Other Services   ? ?  ?Precautions / Restrictions Precautions ?Precautions: Fall ?Restrictions ?Weight Bearing Restrictions: No  ? ? ?  ? ?Mobility Bed Mobility ?Overal bed mobility: Needs Assistance ?Bed Mobility: Supine to Sit ?  ?  ?Supine to  sit: Mod assist ?  ?  ?General bed mobility comments: assist for LEs over EOB, pt pulled up on therapist's hand to raise trunk, assist of bed pad for hips to EOB ?  ? ?Transfers ?Overall transfer level: Needs assistance ?Equipment used: Rolling walker (2 wheels) ?Transfers: Sit to/from Stand, Bed to chair/wheelchair/BSC ?Sit to Stand: +2 physical assistance, Min assist ?  ?  ?Step pivot transfers: +2 physical assistance, Mod assist ?  ?  ?General transfer comment: bed>BSC>chair, pt with hands on walker, assist to rise and steady, requires assist to weight shift and guide walker ?  ?  ?Balance Overall balance assessment: Needs assistance ?Sitting-balance support: No upper extremity supported, Feet supported ?Sitting balance-Leahy Scale: Fair ?  ?Postural control: Right lateral lean ?Standing balance support: Bilateral upper extremity supported, During functional activity ?Standing balance-Leahy Scale: Poor ?Standing balance comment: B UE support of walker and min to mod assist ?  ?  ?  ?  ?  ?  ?  ?  ?  ?  ?  ?   ? ?ADL either performed or assessed with clinical judgement  ? ?ADL Overall ADL's : Needs assistance/impaired ?  ?  ?  ?  ?  ?  ?  ?  ?  ?  ?Lower Body Dressing: Total assistance;Bed level ?  ?Toilet Transfer: +2 for physical assistance;Moderate assistance;Stand-pivot;Rolling walker (2 wheels);BSC/3in1 ?  ?Toileting- Clothing Manipulation and Hygiene: +2 for physical assistance;Total assistance;Sit to/from stand ?  ?  ?  ?  ?General ADL Comments: pt unaware of bowel incontinence, removed depends, sat on commode and assisted for pericare ?  ? ?Extremity/Trunk Assessment   ?  ?  ?  ?  ?  ? ?Vision   ?  ?  ?Perception   ?  ?  Praxis   ?  ? ?Cognition Arousal/Alertness: Awake/alert ?Behavior During Therapy: [facility] for tasks assessed/performed (smiles) ?Overall Cognitive Status: Difficult to assess ?Area of Impairment: Following commands, Problem solving, Safety/judgement ?  ?  ?  ?  ?  ?  ?  ?  ?  ?  ?  ?Following  Commands: Follows one step commands inconsistently (with multimodal cues) ?Safety/Judgement: Decreased awareness of safety, Decreased awareness of deficits ?  ?Problem Solving: Decreased initiation, Requires verbal cues, Requires tactile cues ?General Comments: pt non verbal, has hearing aids ?  ?  ?   ?Exercises   ? ?  ?Shoulder Instructions   ? ? ?  ?General Comments    ? ? ?Pertinent Vitals/ Pain       Pain Assessment ?Pain Assessment: Faces ?Faces Pain Scale: No hurt ? ?Home Living   ?  ?  ?  ?  ?  ?  ?  ?  ?  ?  ?  ?  ?  ?  ?  ?  ?  ?  ? ?  ?Prior Functioning/Environment    ?  ?  ?  ?   ? ?Frequency ? Min 2X/week  ? ? ? ? ?  ?Progress Toward Goals ? ?OT Goals(current goals can now be found in the care plan section) ? Progress towards OT goals: Progressing toward goals ? ?Acute Rehab OT Goals ?OT Goal Formulation: Patient unable to participate in goal setting ?Time For Goal Achievement: 01/08/22 ?Potential to Achieve Goals: Fair  ?Plan Frequency remains appropriate;Discharge plan needs to be updated   ? ?Co-evaluation ? ? ? PT/OT/SLP Co-Evaluation/Treatment: Yes ?Reason for Co-Treatment: For patient/therapist safety ?  ?OT goals addressed during session: ADL's and self-care ?  ? ?  ?AM-PAC OT "6 Clicks" Daily Activity     ?Outcome Measure ? ? Help from another person eating meals?: Total ?Help from another person taking care of personal grooming?: Total ?Help from another person toileting, which includes using toliet, bedpan, or urinal?: Total ?Help from another person bathing (including washing, rinsing, drying)?: Total ?Help from another person to put on and taking off regular upper body clothing?: Total ?Help from another person to put on and taking off regular lower body clothing?: Total ?6 Click Score: 6 ? ?  ?End of Session Equipment Utilized During Treatment: Gait belt;Rolling walker (2 wheels) ? ?OT Visit Diagnosis: Other abnormalities of gait and mobility (R26.89);Muscle weakness (generalized)  (M62.81);Other symptoms and signs involving cognitive function;Cognitive communication deficit (R41.841) ?Symptoms and signs involving cognitive functions: Cerebral infarction ?  ?Activity Tolerance Patient tolerated treatment well ?  ?Patient Left in chair;with call bell/phone within reach;with chair alarm set;with family/visitor present ?  ?Nurse Communication Mobility status;Other (comment) (NT replace purewick) ?  ? ?   ? ?Time: 7741-2878 ?OT Time Calculation (min): 26 min ? ?Charges: OT General Charges ?$OT Visit: 1 Visit ?OT Treatments ?$Self Care/Home Management : 8-22 mins ? ?Nestor Lewandowsky, OTR/L ?Acute Rehabilitation Services ?Pager: [phone] ?Office: [phone]  ? ?Patricia Dudley ?12/31/2021, 10:31 AM ?

## 2021-12-31 NOTE — [place] FL2 (Signed)
?[place] MEDICAID FL2 LEVEL OF CARE SCREENING TOOL  ?  ? ?IDENTIFICATION  ?Patient Name: ?Patricia Dudley Birthdate: [birth year] Sex: female Admission Date (Current Location): ?12/24/2021  ?[place] and [place] Number: ? [place] ?  Facility and Address:  ?[facility], [place] [address], [place], [place] [address] ?     Provider Number: ?[id number]  ?Attending Physician Name and Address:  ?Elgergawy, Silver Huguenin, MD ? Relative Name and Phone Number:  ?  ?   ?Current Level of Care: ?Hospital Recommended Level of Care: ?[place] Prior Approval Number: ?  ? ?Date Approved/Denied: ?  PASRR Number: ?[id number] A ? ?Discharge Plan: ?SNF ?  ? ?Current Diagnoses: ?Patient Active Problem List  ? Diagnosis Date Noted  ? Malnutrition of moderate degree 12/27/2021  ? PNA (pneumonia) 12/24/2021  ? Cerebral thrombosis with cerebral infarction 12/24/2021  ? Anemia, unspecified 06/13/2017  ? Diabetic nephropathy ([place]) 06/13/2017  ? GERD (gastroesophageal reflux disease) 06/13/2017  ? Hyperlipidemia 06/13/2017  ? Hypertension 06/13/2017  ? Osteoporosis 06/13/2017  ? Acute posthemorrhagic anemia   ? Gastritis without bleeding   ? Angiodysplasia of stomach and duodenum with hemorrhage   ? Acute GI bleeding 02/27/2017  ? CVA (cerebral vascular accident) ([place]) 12/20/2016  ? Hypertensive urgency 12/19/2016  ? GI bleed 08/07/2016  ? Type 2 diabetes mellitus with stage 3 chronic kidney disease, without long-term current use of insulin ([place]) 11/01/2015  ? Chronic a-fib ([place]) 09/01/2015  ? Malignant essential hypertension 07/26/2015  ? Renal insufficiency 07/26/2015  ? TIA (transient ischemic attack) 07/26/2015  ? Vertigo 07/26/2015  ? Acquired hypothyroidism 07/04/2015  ? ? ?Orientation RESPIRATION BLADDER Height & Weight   ?  ? (Disoriented x4) ? Normal Incontinent, External catheter Weight: 125 lb 3.2 oz (56.8 kg) ?Height:  5\' 5"  (165.1 cm)  ?BEHAVIORAL SYMPTOMS/MOOD NEUROLOGICAL BOWEL NUTRITION STATUS  ?     Incontinent Diet (See dc summary)  ?AMBULATORY STATUS COMMUNICATION OF NEEDS Skin   ?Extensive Assist Verbally Normal ?  ?  ?  ?    ?     ?     ? ? ?Personal Care Assistance Level of Assistance  ?Bathing, Feeding, Dressing Bathing Assistance: Maximum assistance ?Feeding assistance: Limited assistance ?Dressing Assistance: Limited assistance ?   ? ?Functional Limitations Info  ?    ?  ?   ? ? ?SPECIAL CARE FACTORS FREQUENCY  ?PT (By licensed PT), OT (By licensed OT)   ?  ?PT Frequency: 5x/week ?OT Frequency: 5x/week ?  ?  ?  ?   ? ? ?Contractures Contractures Info: Not present  ? ? ?Additional Factors Info  ?Code Status, Allergies Code Status Info: DNR ?Allergies Info: Ezetimibe, Iodine ?  ?  ?  ?   ? ?Current Medications (12/31/2021):  This is the current hospital active medication list ?Current Facility-Administered Medications  ?Medication Dose Route Frequency Provider Last Rate Last Admin  ? acetaminophen (TYLENOL) tablet 650 mg  650 mg Oral Q4H PRN Nita Sells, MD      ? Or  ? acetaminophen (TYLENOL) 160 MG/5ML solution 650 mg  650 mg Per Tube Q4H PRN Nita Sells, MD      ? Or  ? acetaminophen (TYLENOL) suppository 650 mg  650 mg Rectal Q4H PRN Nita Sells, MD      ? amLODipine (NORVASC) tablet 10 mg  10 mg Oral Daily Elgergawy, Silver Huguenin, MD   10 mg at 12/31/21 2841  ? aspirin EC tablet 81 mg  81 mg  Oral Daily Reome, Craig Guess, RPH   81 mg at 12/30/21 2109  ? feeding supplement (ENSURE ENLIVE / ENSURE PLUS) liquid 237 mL  237 mL Oral BID BM Nita Sells, MD   237 mL at 12/30/21 1111  ? heparin injection 5,000 Units  5,000 Units Subcutaneous Q8H Nita Sells, MD   5,000 Units at 12/31/21 0519  ? latanoprost (XALATAN) 0.005 % ophthalmic solution 1 drop  1 drop Both Eyes QHS Elgergawy, Silver Huguenin, MD   1 drop at 12/30/21 2110  ? levothyroxine (SYNTHROID) tablet 112 mcg  112 mcg Oral Q0600 Elgergawy, Silver Huguenin, MD   112 mcg at 12/31/21 1583  ? loperamide (IMODIUM) capsule 2 mg   2 mg Oral PRN Elgergawy, Silver Huguenin, MD   2 mg at 12/30/21 1109  ? metoprolol tartrate (LOPRESSOR) tablet 50 mg  50 mg Oral BID Elgergawy, Silver Huguenin, MD   50 mg at 12/31/21 0940  ? multivitamin with minerals tablet 1 tablet  1 tablet Oral Daily Elgergawy, Silver Huguenin, MD   1 tablet at 12/31/21 7680  ? rosuvastatin (CRESTOR) tablet 10 mg  10 mg Oral Daily Rosalin Hawking, MD   10 mg at 12/31/21 8811  ? senna-docusate (Senokot-S) tablet 1 tablet  1 tablet Oral QHS PRN Nita Sells, MD      ? sodium chloride flush (NS) 0.9 % injection 3 mL  3 mL Intravenous Once Sherwood Gambler, MD      ? vitamin B-12 (CYANOCOBALAMIN) tablet 100 mcg  100 mcg Oral Daily Elgergawy, Silver Huguenin, MD   100 mcg at 12/31/21 0315  ? ? ? ?Discharge Medications: ?Please see discharge summary for a list of discharge medications. ? ?Relevant Imaging Results: ? ?Relevant Lab Results: ? ? ?Additional Information ?SSN: [id number]. Pfizer vaccines 10/07/19, 11/01/19, 06/08/20 ? ?Lissa Morales Cortni Tays, LCSW ? ? ? ? ?

## 2021-12-31 NOTE — Progress Notes (Signed)
Physical Therapy Treatment ?Patient Details ?Name: Patricia Dudley ?MRN: [id number] ?DOB: 02/02/1930 ?Today's Date: 12/31/2021 ? ? ?History of Present Illness Pt is a 88 y/o female presenting on 5/1 with aphasia, L gaze and slurred speech. MRI 5/1 with "positive for acute small vessel infarct at the left internal capsule". CXR 5/1: "Mild airspace disease at the left base, favor atelectasis."PMH includes: afib, CVA, dementia, DM, HTN, GI bleed. ? ?  ?PT Comments  ? ? Patient easily awakened and smiled throughout session. Non-verbal throughout, however at end of session daughter arrived and put her hearing aides in and she answered "mm hm" to daughter's greeting. Overall her standing balance and tolerance was improved--standing with min assist for up to 3 minutes (during peri-care due to incontinence of bowels). Even with transfers toward her left, she had great difficulty advancing/following with RLE and needed assist to advance.   ?  ?Recommendations for follow up therapy are one component of a multi-disciplinary discharge planning process, led by the attending physician.  Recommendations may be updated based on patient status, additional functional criteria and insurance authorization. ? ?Follow Up Recommendations ? Acute inpatient rehab (3hours/day) (If not approved, SNF placement) ?  ?  ?Assistance Recommended at Discharge Frequent or constant Supervision/Assistance  ?Patient can return home with the following A lot of help with bathing/dressing/bathroom;Assistance with feeding;Assistance with cooking/housework;Assist for transportation;Direct supervision/assist for financial management;Help with stairs or ramp for entrance;Direct supervision/assist for medications management;Two people to help with walking and/or transfers ?  ?Equipment Recommendations ? Rolling walker (2 wheels);BSC/3in1 (May need w/c depending on level of function at conclusion of rehab)  ?  ?Recommendations for Other Services   ? ? ?   ?Precautions / Restrictions Precautions ?Precautions: Fall ?Restrictions ?Weight Bearing Restrictions: No  ?  ? ?Mobility ? Bed Mobility ?Overal bed mobility: Needs Assistance ?Bed Mobility: Supine to Sit ?  ?  ?Supine to sit: Mod assist ?  ?  ?General bed mobility comments: assist for LEs over EOB, pt pulled up on therapist's hand to raise trunk, assist of bed pad for hips to EOB ?  ? ?Transfers ?Overall transfer level: Needs assistance ?Equipment used: Rolling walker (2 wheels) ?Transfers: Sit to/from Stand, Bed to chair/wheelchair/BSC ?Sit to Stand: +2 physical assistance, Min assist ?  ?Step pivot transfers: +2 physical assistance, Mod assist ?  ?  ?  ?General transfer comment: bed>BSC>chair, pt with hands on walker, assist to rise and steady, requires assist to weight shift, advance RLE, and guide walker ?  ? ?Ambulation/Gait ?  ?  ?  ?  ?  ?  ?  ?General Gait Details: pt with incontinence of bowels upon standing; session focused on toileting and then transfer to chair ? ? ?Stairs ?  ?  ?  ?  ?  ? ? ?Wheelchair Mobility ?  ? ?Modified Rankin (Stroke Patients Only) ?Modified Rankin (Stroke Patients Only) ?Pre-Morbid Rankin Score: Moderate disability ?Modified Rankin: Severe disability ? ? ?  ?Balance Overall balance assessment: Needs assistance ?Sitting-balance support: No upper extremity supported, Feet supported ?Sitting balance-Leahy Scale: Fair ?  ?Postural control: Right lateral lean ?Standing balance support: Bilateral upper extremity supported, During functional activity ?Standing balance-Leahy Scale: Poor ?Standing balance comment: B UE support of walker and min to mod assist ?  ?  ?  ?  ?  ?  ?  ?  ?  ?  ?  ?  ? ?  ?Cognition Arousal/Alertness: Awake/alert ?Behavior During Therapy: [facility] for tasks assessed/performed (smiles) ?Overall Cognitive  Status: Difficult to assess ?Area of Impairment: Following commands, Problem solving, Safety/judgement ?  ?  ?  ?  ?  ?  ?  ?  ?  ?  ?  ?Following Commands:  Follows one step commands inconsistently (with multimodal cues) ?Safety/Judgement: Decreased awareness of safety, Decreased awareness of deficits ?  ?Problem Solving: Decreased initiation, Requires verbal cues, Requires tactile cues ?General Comments: pt non verbal, has hearing aids ?  ?  ? ?  ?Exercises   ? ?  ?General Comments   ?  ?  ? ?Pertinent Vitals/Pain Pain Assessment ?Pain Assessment: Faces ?Faces Pain Scale: No hurt ?Breathing: normal ?Negative Vocalization: none ?Facial Expression: smiling or inexpressive ?Body Language: relaxed ?Consolability: no need to console ?PAINAD Score: 0  ? ? ?Home Living   ?  ?  ?  ?  ?  ?  ?  ?  ?  ?   ?  ?Prior Function    ?  ?  ?   ? ?PT Goals (current goals can now be found in the care plan section) Acute Rehab PT Goals ?Patient Stated Goal: none stated, fairly non verbal ?Time For Goal Achievement: 01/08/22 ?Potential to Achieve Goals: Good ?Progress towards PT goals: Progressing toward goals ? ?  ?Frequency ? ? ? Min 4X/week ? ? ? ?  ?PT Plan Current plan remains appropriate  ? ? ?Co-evaluation PT/OT/SLP Co-Evaluation/Treatment: Yes ?Reason for Co-Treatment: For patient/therapist safety;To address functional/ADL transfers ?PT goals addressed during session: Mobility/safety with mobility;Balance;Proper use of DME ?OT goals addressed during session: ADL's and self-care ?  ? ?  ?AM-PAC PT "6 Clicks" Mobility   ?Outcome Measure ? Help needed turning from your back to your side while in a flat bed without using bedrails?: A Lot ?Help needed moving from lying on your back to sitting on the side of a flat bed without using bedrails?: A Lot ?Help needed moving to and from a bed to a chair (including a wheelchair)?: A Lot ?Help needed standing up from a chair using your arms (e.g., wheelchair or bedside chair)?: A Lot ?Help needed to walk in hospital room?: Total ?Help needed climbing 3-5 steps with a railing? : Total ?6 Click Score: 10 ? ?  ?End of Session Equipment Utilized  During Treatment: Gait belt ?Activity Tolerance: Patient tolerated treatment well ?Patient left: in chair;with family/visitor present;with chair alarm set;with nursing/sitter in room ?Nurse Communication: Mobility status ?PT Visit Diagnosis: Muscle weakness (generalized) (M62.81);Other abnormalities of gait and mobility (R26.89);Hemiplegia and hemiparesis ?Hemiplegia - Right/Left: Right ?Hemiplegia - dominant/non-dominant: Dominant ?Hemiplegia - caused by: Cerebral infarction ?  ? ? ?Time: 2585-2778 ?PT Time Calculation (min) (ACUTE ONLY): 23 min ? ?Charges:  $Therapeutic Activity: 8-22 mins          ?          ? ? ?Arby Barrette, PT ?Acute Rehabilitation Services  ?Pager [phone] ?Office [phone] ? ? ? ?Jeanie Cooks Deatra Mcmahen ?12/31/2021, 10:44 AM ? ?

## 2021-12-31 NOTE — Progress Notes (Signed)
?PROGRESS NOTE ? ? ? Patricia Dudley  MOQ:[id number] DOB: [birth year] DOA: 12/24/2021 ?PCP: Tracie Harrier, MD  ? ?Chief Complaint  ?Patient presents with  ? Code Stroke  ? ? ?Brief Narrative:  ? ?78 female with past medical history of CKD stage III B, hypothyroidism, paroxysmal A-fib on anticoagulation due to to GI bleed, diabetes mellitus, hypertension, Hypothyroid, Prior GI Bleed 04/2017 2/2 AVM duodenum [tx 3 u PRBC], diabetes mellitus, CVA , she presents with aphasia, left gaze, slurred speech, and vomiting, work-up significant for acute CVA, admitted for further work-up. ?  ? ?Assessment & Plan: ?  ?Principal Problem: ?  PNA (pneumonia) ?Active Problems: ?  Cerebral thrombosis with cerebral infarction ?  Malnutrition of moderate degree ? ?Acute CVA ?- left BG/PLIC small infarct secondary to small vessel disease source given the patient, however cardioembolic cannot be completely ruled out given history of A-fib not on [facility] ?-MRI brain significant for left basal ganglia/intracapsular small infarct ?-MRA unremarkable ?-Carotid Doppler unremarkable ?-2D echo with EF 60 to 65% ?-LDL at 110-A1c at 6.5 ?-Not on any antithrombotic therapy prior to admission, now on aspirin 81 mg daily, unfortunately she is not a good candidate for dual antiplatelet therapy due to history of severe GI bleed. ?-Recommendation for CIR. ?-Allow for permissive hypertension initially, she is being resumed gradually on her meds ? ? ?Underlying A-fib  ?- not on anticoagulation secondary to GI bleeds ?-Started on aspirin due to CVA. ?-As well she is started on low-dose metoprolol for heart rate control, has been uptitrated gradually, heart rate is currently controlled.  ? ?Aspiration  pneumonia ?-Chest x-ray significant for left lung base pneumonia ?-Treated  with IV Rocephin and azithromycin, transition to Augmentin, no further need for antibiotics, will DC especially with diarrhea. ?-Off on dysphagia 3 diet, SLP evaluation greatly  appreciated, she has been downgraded to dysphagia 1 with thin liquid. ? ?Acute metabolic encephalopathy due to above ?-Followed closely by PT/OT/SLP ?-Patient has been gradually improving, as well her dysphagia, she was advanced to dysphagia 2 with thin liquid ? ?Acute kidney injury ?-Due to hypovolemia/prerenal, given dehydration with ACEs/Demadex use ?-Improving with hydration ? ?Hypernatremia  ?-Change fluid to D5 half-normal saline. ? ?Prior GI bleed 2018 secondary to AVMs ?Poor candidacy for anticoagulation may be a candidate for antiplatelet-defer to neurology ? ?Hypothyroid ?-Continue with Synthroid, she remains on IV, change to p.o. when oral intake is more reliable ? 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 ?Hypertension ?-Antihypertensive medication has been reintroduced gradually ?-Blood pressure meds adjusted frequently, remains elevated on Norvasc and metoprolol, will add lisinopril 20 mg daily. ? ?NSVT ?-Patient with 14 beats of nonsustained V. tach overnight, keep potassium >4 , Mg > 2, already on beta-blockers, recent echo with a preserved EF. ? ?Hyperlipidemia ?-LDL is 110, started on Crestor ? ? ?  ? ?DVT prophylaxis: [place] heparin ?Code Status: DNR ?Family Communication: none at bedside ?Disposition: CIR ? ?Status is: Inpatient ?Remains inpatient appropriate ?  ?Consultants:  ?Neurology ? ?Subjective: ? ?No significant events overnight as discussed with staff, patient cannot provide any complaints. ? ? ?Objective: ?Vitals:  ? 12/31/21 0322 12/31/21 0400 12/31/21 0500 12/31/21 0724  ?BP: (!) 182/84 (!) 173/94  (!) 179/58  ?Pulse: (!) 113 87 86   ?Resp: (!) 24 (!) 21 13   ?Temp: 97.8 ?F (36.6 ?C)   98 ?F (36.7 ?C)  ?TempSrc: Axillary  Axillary  ?SpO2: 94% 96% 98%   ?Weight:      ?Height:      ? ? ?Intake/Output Summary (Last 24 hours) at 12/31/2021 1131 ?Last data filed at 12/31/2021 0940 ?Gross  per 24 hour  ?Intake 609.37 ml  ?Output 400 ml  ?Net 209.37 ml  ? ?Filed Weights  ? 12/24/21 1437 12/24/21 1453 12/25/21 1446  ?Weight: 59.8 kg 59.4 kg 56.8 kg  ? ? ?Examination: ? ?Awake, in no apparent distress, minimally interactive ?Symmetrical Chest wall movement, Good air movement bilaterally, CTAB ?RRR,No Gallops,Rubs or new Murmurs, No Parasternal Heave ?+ve B.Sounds, Abd Soft, No tenderness, No rebound - guarding or rigidity. ? ? ? ? ? ? ? ? ? ?Data Reviewed: I have personally reviewed following labs and imaging studies ? ?CBC: ?Recent Labs  ?Lab 12/24/21 ?1436 12/24/21 ?1438 12/25/21 ?0530 12/26/21 ?0106 12/27/21 ?4098  ?WBC 16.0*  --  18.4* 11.8* 8.5  ?NEUTROABS 12.2*  --   --   --   --   ?HGB 12.1 12.2 10.5* 10.7* 10.0*  ?HCT 35.9* 36.0 32.0* 33.2* 30.3*  ?MCV 82.7  --  84.7 84.3 83.7  ?PLT 313  --  251 238 218  ? ? ?Basic Metabolic Panel: ?Recent Labs  ?Lab 12/24/21 ?1436 12/24/21 ?1438 12/25/21 ?0530 12/26/21 ?0106 12/27/21 ?0238 12/29/21 ?0100  ?NA 144 146* 149* 148* 143 140  ?K 4.5 4.5 4.2 3.7 3.6 3.8  ?CL 111 112* 120* 116* 113* 109  ?CO2 23  --  24 23 23 26   ?GLUCOSE 154* 161* 129* 109* 144* 122*  ?BUN 27* 36* 21 15 14 15   ?CREATININE 2.33* 2.40* 2.05* 1.86* 1.92* 1.76*  ?CALCIUM 9.1  --  8.2* 8.3* 8.2* 8.6*  ?MG  --   --   --   --   --  1.8  ? ? ?GFR: ?Estimated Creatinine Clearance: 19.4 mL/min (A) (by C-G formula based on SCr of 1.76 mg/dL (H)). ? ?Liver Function Tests: ?Recent Labs  ?Lab 12/24/21 ?1436 12/26/21 ?0106  ?AST 25 38  ?ALT 11 23  ?ALKPHOS 56 49  ?BILITOT 0.8 0.6  ?PROT 7.1 6.2*  ?ALBUMIN 3.6 2.8*  ? ? ?CBG: ?Recent Labs  ?Lab 12/29/21 ?1723 12/30/21 ?1191 12/30/21 ?1349 12/30/21 ?2101 12/31/21 ?0727  ?GLUCAP 161* 153* 222* 158* 136*  ? ? ? ?Recent Results (from the past 240 hour(s))  ?Resp Panel by RT-PCR (Flu A&B, Covid) Peripheral     Status: None  ? Collection Time: 12/24/21  2:30 PM  ? Specimen: Peripheral; Nasopharyngeal(NP) swabs in vial transport medium  ?Result Value Ref  Range Status  ? SARS Coronavirus 2 by RT PCR NEGATIVE NEGATIVE Final  ?  Comment: (NOTE) ?SARS-CoV-2 target nucleic acids are NOT DETECTED. ? ?The SARS-CoV-2 RNA is generally detectable in upper respiratory ?specimens during the acute phase of infection. The lowest ?concentration of SARS-CoV-2 viral copies this assay can detect is ?138 copies/mL. A negative result does not preclude SARS-Cov-2 ?infection and should not be used as the sole basis for treatment or ?other patient management decisions. A negative result may occur with  ?improper specimen collection/handling, submission of specimen other ?than nasopharyngeal swab, presence of viral mutation(s) within the ?areas targeted by this assay, and inadequate number of viral ?copies(<138 copies/mL). A negative result must be combined with ?clinical observations, patient history, and epidemiological ?information. The expected result is Negative. ? ?Fact Sheet for Patients:  ?EntrepreneurPulse.com.au ? ?Fact Sheet for Healthcare Providers:  ?IncredibleEmployment.be ? ?This test is no t yet  approved or cleared by the [place] and  ?has been authorized for detection and/or diagnosis of SARS-CoV-2 by ?FDA under an Emergency Use Authorization (EUA). This EUA will remain  ?in effect (meaning this test can be used) for the duration of the ?COVID-19 declaration under Section 564(b)(1) of the Act, 21 ?U.S.C.section 360bbb-3(b)(1), unless the authorization is terminated  ?or revoked sooner.  ? ? ?  ? Influenza A by PCR NEGATIVE NEGATIVE Final  ? Influenza B by PCR NEGATIVE NEGATIVE Final  ?  Comment: (NOTE) ?The Xpert Xpress SARS-CoV-2/FLU/RSV plus assay is intended as an aid ?in the diagnosis of influenza from Nasopharyngeal swab specimens and ?should not be used as a sole basis for treatment. Nasal washings and ?aspirates are unacceptable for Xpert Xpress SARS-CoV-2/FLU/RSV ?testing. ? ?Fact Sheet for  Patients: ?EntrepreneurPulse.com.au ? ?Fact Sheet for Healthcare Providers: ?IncredibleEmployment.be ? ?This test is not yet approved or cleared by the [place] and ?has been authorized for detection

## 2022-01-01 DIAGNOSIS — J189 Pneumonia, unspecified organism: Secondary | ICD-10-CM | POA: Diagnosis not present

## 2022-01-01 DIAGNOSIS — I6330 Cerebral infarction due to thrombosis of unspecified cerebral artery: Secondary | ICD-10-CM | POA: Diagnosis not present

## 2022-01-01 NOTE — Progress Notes (Signed)
Speech Language Pathology Treatment: Dysphagia  ?Patient Details ?Name: Patricia Dudley ?MRN: [id number] ?DOB: 1930-08-05 ?Today's Date: 01/01/2022 ?Time: 1555-1610 ?SLP Time Calculation (min) (ACUTE ONLY): 15 min ? ?Assessment / Plan / Recommendation ?Clinical Impression ? Patient seen by SLP for skilled treatment session focused on dysphagia goals. SLP spoke with one of patient's daughters, Patricia Dudley prior to entering the room while PT finished up with patient. Patricia Dudley reported that patient is still only taking very small bites of foods and mostly drinking liquids as far as PO's. Upon entering room, patient was awake and alert and agreeable to having some bites of diced canned peaches. Although SLP attempted via [facility], patient unable to demonstrate adequate grasp and use of plastic spoon. Patient exhibited prolonged mastication and oral transit of Dys 2 solids but with full oral clearance post swallows. After 3-4 bites, she started to indicate she did not want anymore (keeping her mouth closed, and pulling away slightly) and in addition she appeared to be getting tired. SLP discussed plan to keep her on puree solids but encourage her to eat foods that are naturally pureed texture anyways to promote improved PO intake as Patricia Dudley and her sister have reported patient does not like the pureed meats. SLP will continue to follow patient for dysphagia however she is expected to discharge to AIR when bed available (should be 1-2 days) . ?  ?HPI HPI: Patricia Dudley is a 88 y.o. female who presented to [facility] ED via EMS for aphasia, left gaze and slurred speech. Pt with recent episondes of vomiting at home and in ED. MRI 5/2: "positive for acute small vessel infarct at the left internal capsule." CXR 5/1: "Mild airspace disease at the left base, favor atelectasis."  Pt  with past medical history of HTN, HLD, A fib not on AC, GI bleed, dementia, CVA, hypothyroidism and DM. ?  ?   ?SLP Plan ? Continue with current plan of care ? ?  ?   ?Recommendations for follow up therapy are one component of a multi-disciplinary discharge planning process, led by the attending physician.  Recommendations may be updated based on patient status, additional functional criteria and insurance authorization. ?  ? ?Recommendations  ?Diet recommendations: Dysphagia 1 (puree);Thin liquid ?Liquids provided via: Cup;Straw ?Medication Administration: Crushed with puree ?Supervision: Staff to assist with self feeding;Full supervision/cueing for compensatory strategies ?Compensations: Slow rate;Small sips/bites;Minimize environmental distractions ?Postural Changes and/or Swallow Maneuvers: Seated upright 90 degrees;Upright 30-60 min after meal  ?   ?    ?   ? ? ? ? Oral Care Recommendations: Oral care BID ?Follow Up Recommendations: Acute inpatient rehab (3hours/day) ?Assistance recommended at discharge: Frequent or constant Supervision/Assistance ?SLP Visit Diagnosis: Dysphagia, unspecified (R13.10) ?Plan: Continue with current plan of care ? ? ? ? ?  ?  ? ? ?Sonia Baller, MA, CCC-SLP ?Speech Therapy ? ?

## 2022-01-01 NOTE — Progress Notes (Signed)
Inpatient Rehab Admissions Coordinator:  ? ?I have insurance approval for CIR.  Unfortunately I do not have a bed available for this patient today.  I will follow for admit (hopefully in the next 1-2 days) pending bed availability.   ? ?Shann Medal, PT, DPT ?Admissions Coordinator ?[phone] ?01/01/22  ?12:43 PM ? ?

## 2022-01-01 NOTE — Progress Notes (Signed)
Physical Therapy Treatment ?Patient Details ?Name: Patricia Dudley ?MRN: [id number] ?DOB: 07/04/1930 ?Today's Date: 01/01/2022 ? ? ?History of Present Illness Pt is a 88 y/o female presenting on 5/1 with aphasia, L gaze and slurred speech. MRI 5/1 with "positive for acute small vessel infarct at the left internal capsule". CXR 5/1: "Mild airspace disease at the left base, favor atelectasis."PMH includes: afib, CVA, dementia, DM, HTN, GI bleed. ? ?  ?PT Comments  ? ? Patient continues to require multi-modal cues and facilitation to assist pt with following simple commands. She was able to progress to standing and transferring with one person assist this session. Continues to be limited by rt sided weakness with decr coordination resulting in decreased balance and ability to mobilize safely.  ?   ?Recommendations for follow up therapy are one component of a multi-disciplinary discharge planning process, led by the attending physician.  Recommendations may be updated based on patient status, additional functional criteria and insurance authorization. ? ?Follow Up Recommendations ? Acute inpatient rehab (3hours/day) (If not approved, SNF placement) ?  ?  ?Assistance Recommended at Discharge Frequent or constant Supervision/Assistance  ?Patient can return home with the following A lot of help with bathing/dressing/bathroom;Assistance with feeding;Assistance with cooking/housework;Assist for transportation;Direct supervision/assist for financial management;Help with stairs or ramp for entrance;Direct supervision/assist for medications management;Two people to help with walking and/or transfers ?  ?Equipment Recommendations ? Rolling walker (2 wheels);BSC/3in1 (May need w/c depending on level of function at conclusion of rehab)  ?  ?Recommendations for Other Services   ? ? ?  ?Precautions / Restrictions Precautions ?Precautions: Fall ?Restrictions ?Weight Bearing Restrictions: No  ?  ? ?Mobility ? Bed Mobility ?Overal bed  mobility: Needs Assistance ?Bed Mobility: Sit to Supine ?  ?  ?  ?Sit to supine: Total assist, +2 for physical assistance ?  ?  ?  ? ?Transfers ?Overall transfer level: Needs assistance ?Equipment used: 2 person hand held assist ?Transfers: Sit to/from Stand, Bed to chair/wheelchair/BSC ?Sit to Stand: Mod assist ?  ?Step pivot transfers: Mod assist ?  ?  ?  ?General transfer comment: stood from recliner and noted to have had a BM; stood again from recliner for pericare (stood ~3 minutes); step-pivoted to bed with pt able to advance RLE without assist ?  ? ?Ambulation/Gait ?  ?  ?  ?  ?  ?  ?  ?General Gait Details: pt with incontinence of bowels; session focused on standing and transfer back to bed for thorough cleaning ? ? ?Stairs ?  ?  ?  ?  ?  ? ? ?Wheelchair Mobility ?  ? ?Modified Rankin (Stroke Patients Only) ?Modified Rankin (Stroke Patients Only) ?Pre-Morbid Rankin Score: Moderate disability ?Modified Rankin: Severe disability ? ? ?  ?Balance Overall balance assessment: Needs assistance ?Sitting-balance support: No upper extremity supported, Feet supported ?Sitting balance-Leahy Scale: Fair ?Sitting balance - Comments: L lateral lean;posterior ?Postural control: Posterior lean ?Standing balance support: Bilateral upper extremity supported, During functional activity ?Standing balance-Leahy Scale: Poor ?Standing balance comment: B UE support with strong posterior lean ?  ?  ?  ?  ?  ?  ?  ?  ?  ?  ?  ?  ? ?  ?Cognition Arousal/Alertness: Awake/alert ?Behavior During Therapy: [facility] for tasks assessed/performed (smiles) ?Overall Cognitive Status: Difficult to assess ?Area of Impairment: Following commands, Problem solving, Safety/judgement ?  ?  ?  ?  ?  ?  ?  ?  ?  ?  ?  ?Following  Commands: Follows one step commands inconsistently (with multimodal cues) ?  ?  ?Problem Solving: Decreased initiation, Requires verbal cues, Requires tactile cues ?General Comments: pt non verbal, has hearing aids ?  ?  ? ?   ?Exercises   ? ?  ?General Comments   ?  ?  ? ?Pertinent Vitals/Pain Pain Assessment ?Pain Assessment: PAINAD ?Faces Pain Scale: No hurt ?Breathing: normal ?Negative Vocalization: none ?Facial Expression: smiling or inexpressive ?Body Language: relaxed ?Consolability: no need to console ?PAINAD Score: 0  ? ? ?Home Living   ?  ?  ?  ?  ?  ?  ?  ?  ?  ?   ?  ?Prior Function    ?  ?  ?   ? ?PT Goals (current goals can now be found in the care plan section) Acute Rehab PT Goals ?Patient Stated Goal: none stated, fairly non verbal ?Time For Goal Achievement: 01/08/22 ?Potential to Achieve Goals: Good ?Progress towards PT goals: Progressing toward goals ? ?  ?Frequency ? ? ? Min 4X/week ? ? ? ?  ?PT Plan Current plan remains appropriate  ? ? ?Co-evaluation   ?  ?  ?  ?  ? ?  ?AM-PAC PT "6 Clicks" Mobility   ?Outcome Measure ? Help needed turning from your back to your side while in a flat bed without using bedrails?: A Lot ?Help needed moving from lying on your back to sitting on the side of a flat bed without using bedrails?: A Lot ?Help needed moving to and from a bed to a chair (including a wheelchair)?: A Lot ?Help needed standing up from a chair using your arms (e.g., wheelchair or bedside chair)?: A Lot ?Help needed to walk in hospital room?: Total ?Help needed climbing 3-5 steps with a railing? : Total ?6 Click Score: 10 ? ?  ?End of Session Equipment Utilized During Treatment: Gait belt ?Activity Tolerance: Patient tolerated treatment well ?Patient left: with family/visitor present;in bed;with call bell/phone within reach;with bed alarm set ?Nurse Communication: Mobility status;Other (comment) (incontinent of bowels) ?PT Visit Diagnosis: Muscle weakness (generalized) (M62.81);Other abnormalities of gait and mobility (R26.89);Hemiplegia and hemiparesis ?Hemiplegia - Right/Left: Right ?Hemiplegia - dominant/non-dominant: Dominant ?Hemiplegia - caused by: Cerebral infarction ?  ? ? ?Time: 1610-9604 ?PT Time  Calculation (min) (ACUTE ONLY): 32 min ? ?Charges:  $Gait Training: 23-37 mins          ?          ? ? ?Arby Barrette, PT ?Acute Rehabilitation Services  ?Pager [phone] ?Office [phone] ? ? ? ?Jeanie Cooks Sabriyah Wilcher ?01/01/2022, 5:05 PM ? ?

## 2022-01-01 NOTE — Progress Notes (Signed)
Nutrition Follow-up ? ?DOCUMENTATION CODES:  ? ?Non-severe (moderate) malnutrition in context of chronic illness ? ?INTERVENTION:  ? ?Multivitamin w/ minerals daily ?Continue Ensure Enlive po BID, each supplement provides 350 kcal and 20 grams of protein. ?Feeding assist with all meals ?Encourage good PO intake  ? ?NUTRITION DIAGNOSIS:  ? ?Moderate Malnutrition related to chronic illness as evidenced by mild muscle depletion, mild fat depletion, percent weight loss. - Ongoing  ? ?GOAL:  ? ?Patient will meet greater than or equal to 90% of their needs - Ongoing ? ?MONITOR:  ? ?PO intake, Supplement acceptance, Labs, Weight trends ? ?REASON FOR ASSESSMENT:  ? ?Malnutrition Screening Tool ?  ? ?ASSESSMENT:  ? ?88 y.o. female presented to the ED with aphasia and L gaze, slurred speech. PMH includes TIA, CVA, GERD, HTN, T2DM, and A. Fib. Pt admitted with L BG/PLIC small infarct.  ? ?Pt was able to advance diet to Dysphagia 3, but started coughing with intakes. Diet has now been downgraded back to Dysphagia 1 on 5/5. ? ?Pt sleeping at time of RD visit, spoke with pt daughter. ?Daughter reports that pt has been eating better, states that pt is still needing full assist with meals.  ?Per EMR, pt intake includes: ?5/06: Breakfast 75%, Dinner 50% ?5/07: Breakfast 50% ?5/08: Breakfast 50% ? ?Reports that she is drinking some Ensure's; daughter inquiring if Ensure can cause diarrhea. Discussed that they are suitable for lactose intolerance, but there is a chance that they can. RD reviewed pt BM report; pt with Type 4 to Type 6 stools recorded, no recordings of a Type 7.  ? ?Possible discharge to CIR; pending insurance authorization.  ? ?Daughter with no other questions or concerns at this time.  ? ?Medications reviewed and include: MVI, Vitamin B12  ?Labs reviewed:24 hr CBG 136-175 ? ?Diet Order:   ?Diet Order   ? ?       ?  DIET - DYS 1 Room service appropriate? Yes with Assist; Fluid consistency: Thin  Diet effective now        ?  ? ?  ?  ? ?  ? ?EDUCATION NEEDS:  ? ?Not appropriate for education at this time ? ?Skin:  Skin Assessment: Reviewed RN Assessment ? ?Last BM:  5/8 - Type 6 ? ?Height:  ?Ht Readings from Last 1 Encounters:  ?12/25/21 5\' 5"  (1.651 m)  ? ?Weight:  ?Wt Readings from Last 1 Encounters:  ?12/25/21 56.8 kg  ? ?Ideal Body Weight:  56.8 kg ? ?BMI:  Body mass index is 20.83 kg/m?. ? ?Estimated Nutritional Needs:  ?Kcal:  1600-1800 ?Protein:  80-95 grams ?Fluid:  >/= 1.6 L ? ? ? ?Hermina Barters RD, LDN ?Clinical Dietitian ?See AMiON for contact information.  ? ?

## 2022-01-01 NOTE — Progress Notes (Signed)
?PROGRESS NOTE ? ? ? Patricia Dudley  RWE:[id number] DOB: [birth year] DOA: 12/24/2021 ?PCP: Tracie Harrier, MD  ? ?Chief Complaint  ?Patient presents with  ? Code Stroke  ? ? ?Brief Narrative:  ? 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 ?  ? ?Assessment & Plan: ?  ?Principal Problem: ?  PNA (pneumonia) ?Active Problems: ?  Cerebral thrombosis with cerebral infarction ?  Malnutrition of moderate degree ? ?Acute CVA ?- left BG/PLIC small infarct secondary to small vessel disease source given the patient, however cardioembolic cannot be completely ruled out given history of A-fib not on [facility] ?-MRI brain significant for left basal ganglia/intracapsular small infarct ?-MRA unremarkable ?-Carotid Doppler unremarkable ?-2D echo with EF 60 to 65% ?-LDL at 110-A1c at 6.5 ?-Not on any antithrombotic therapy prior to admission, now on aspirin 81 mg daily, unfortunately she is not a good candidate for dual antiplatelet therapy due to history of severe GI bleed. ?-Recommendation for CIR. ?-Allow for permissive hypertension initially, she is being resumed gradually on her meds ? ? ?Underlying A-fib  ?- not on anticoagulation secondary to GI bleeds ?-Started on aspirin due to CVA. ?-As well she is started on low-dose metoprolol for heart rate control, has been uptitrated gradually, heart rate is currently controlled.  ? ?Aspiration  pneumonia ?-Chest x-ray significant for left lung base pneumonia ?-Treated  with IV Rocephin and azithromycin, transition to Augmentin, no further need for antibiotics, Augmentin has been discontinued given diarrhea, diarrhea currently has resolved.    ?-Initially started on dysphagia 3 diet, SLP evaluation greatly appreciated, she has been downgraded to dysphagia 1 with thin liquid. ? ?Acute metabolic encephalopathy due to above ?-Followed closely by PT/OT/SLP ?-Patient has been gradually improving, as well her dysphagia, she was advanced to dysphagia 1 with thin liquid ? ?Acute kidney injury ?-Due to hypovolemia/prerenal, given dehydration with ACEs/Demadex use ?-Improving with hydration, creatinine back to baseline ? ?Hypernatremia  ?-Change fluid to D5 half-normal saline. ? ?Prior GI bleed 2018 secondary to AVMs ?Poor candidacy for anticoagulation may be a candidate for antiplatelet-defer to neurology ? ?Hypothyroid ?-Continue with Synthroid, she remains on IV, change to p.o. when oral intake is more reliable ? 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 ?Hypertension ?-Antihypertensive medication has been reintroduced gradually ?-Blood pressure meds adjusted frequently, remains elevated on Norvasc and metoprolol, has been started as well.  ? ?NSVT ?-Patient with 14 beats of nonsustained V. tach overnight, keep potassium >4 , Mg > 2, already on beta-blockers, recent echo with a preserved EF. ? ?Hyperlipidemia ?-LDL is 110, started on Crestor ? ? ?  ? ?DVT prophylaxis: Altha heparin ?Code Status: DNR ?Family Communication: Discussed with daughter at bedside ?Disposition: CIR ? ?Status is: Inpatient ?Remains inpatient appropriate, patient is medically stable for discharge to CIR once bed is available ?  ?Consultants:  ?Neurology ? ?Subjective: ? ?No significant events overnight as discussed with staff, patient cannot provide any complaints. ? ? ?Objective: ?Vitals:  ? 12/31/21 2308 01/01/22 0306 01/01/22 0751 01/01/22 1147  ?BP: (!) 155/68 (!) 152/71 (!) 154/74 138/67  ?Pulse: 81 93  61 77  ?Resp: 17 19 17 16   ?Temp:   98 ?F (36.7 ?C) 98.2 ?F (36.8 ?C)   ?TempSrc:   Oral Oral  ?SpO2: 96% 95% 95% 99%  ?Weight:      ?Height:      ? ? ?Intake/Output Summary (Last 24 hours) at 01/01/2022 1406 ?Last data filed at 01/01/2022 0900 ?Gross per 24 hour  ?Intake 75 ml  ?Output --  ?Net 75 ml  ? ?Filed Weights  ? 12/24/21 1437 12/24/21 1453 12/25/21 1446  ?Weight: 59.8 kg 59.4 kg 56.8 kg  ? ? ?Examination: ? ?Awake pleasant, open eyes, minimally interactive ?Symmetrical Chest wall movement, Good air movement bilaterally, CTAB ?Irregular irregular,No Gallops,Rubs or new Murmurs, No Parasternal Heave ?+ve B.Sounds, Abd Soft, No tenderness, No rebound - guarding or rigidity. ?No Cyanosis, Clubbing or edema, No new Rash or bruise   ? ? ? ? ? ? ? ? ? ?Data Reviewed: I have personally reviewed following labs and imaging studies ? ?CBC: ?Recent Labs  ?Lab 12/26/21 ?0106 12/27/21 ?6283  ?WBC 11.8* 8.5  ?HGB 10.7* 10.0*  ?HCT 33.2* 30.3*  ?MCV 84.3 83.7  ?PLT 238 218  ? ? ?Basic Metabolic Panel: ?Recent Labs  ?Lab 12/26/21 ?0106 12/27/21 ?6629 12/29/21 ?0100  ?NA 148* 143 140  ?K 3.7 3.6 3.8  ?CL 116* 113* 109  ?CO2 23 23 26   ?GLUCOSE 109* 144* 122*  ?BUN 15 14 15   ?CREATININE 1.86* 1.92* 1.76*  ?CALCIUM 8.3* 8.2* 8.6*  ?MG  --   --  1.8  ? ? ?GFR: ?Estimated Creatinine Clearance: 19.4 mL/min (A) (by C-G formula based on SCr of 1.76 mg/dL (H)). ? ?Liver Function Tests: ?Recent Labs  ?Lab 12/26/21 ?0106  ?AST 38  ?ALT 23  ?ALKPHOS 49  ?BILITOT 0.6  ?PROT 6.2*  ?ALBUMIN 2.8*  ? ? ?CBG: ?Recent Labs  ?Lab 12/30/21 ?1349 12/30/21 ?2101 12/31/21 ?0727 12/31/21 ?1152 12/31/21 ?1553  ?GLUCAP 222* 158* 136* 143* 175*  ? ? ? ?Recent Results (from the past 240 hour(s))  ?Resp Panel by RT-PCR (Flu A&B, Covid) Peripheral     Status: None  ? Collection Time: 12/24/21  2:30 PM  ? Specimen: Peripheral; Nasopharyngeal(NP) swabs in vial transport medium  ?Result Value Ref Range Status  ? SARS Coronavirus 2 by RT PCR NEGATIVE NEGATIVE Final  ?  Comment: (NOTE) ?SARS-CoV-2 target nucleic acids are NOT  DETECTED. ? ?The SARS-CoV-2 RNA is generally detectable in upper respiratory ?specimens during the acute phase of infection. The lowest ?concentration of SARS-CoV-2 viral copies this assay can detect is ?138 copies/mL. A negative result does not preclude SARS-Cov-2 ?infection and should not be used as the sole basis for treatment or ?other patient management decisions. A negative result may occur with  ?improper specimen collection/handling, submission of specimen other ?than nasopharyngeal swab, presence of viral mutation(s) within the ?areas targeted by this assay, and inadequate number of viral ?copies(<138 copies/mL). A negative result must be combined with ?clinical observations, patient history, and epidemiological ?information. The expected result is Negative. ? ?Fact Sheet for Patients:  ?EntrepreneurPulse.com.au ? ?Fact Sheet for Healthcare Providers:  ?IncredibleEmployment.be ? ?This test is no t yet approved or cleared by the [place] FDA and  ?has been authorized for detection and/or diagnosis of SARS-CoV-2 by ?FDA under an Emergency Use Authorization (EUA). This EUA will remain  ?in effect (meaning this test can be used) for the duration of the ?COVID-19 declaration under Section 564(b)(1) of the Act, 21 ?U.S.C.section 360bbb-3(b)(1), unless  the authorization is terminated  ?or revoked sooner.  ? ? ?  ? Influenza A by PCR NEGATIVE NEGATIVE Final  ? Influenza B by PCR NEGATIVE NEGATIVE Final  ?  Comment: (NOTE) ?The Xpert Xpress SARS-CoV-2/FLU/RSV plus assay is intended as an aid ?in the diagnosis of influenza from Nasopharyngeal swab specimens and ?should not be used as a sole basis for treatment. Nasal washings and ?aspirates are unacceptable for Xpert Xpress SARS-CoV-2/FLU/RSV ?testing. ? ?Fact Sheet for Patients: ?EntrepreneurPulse.com.au ? ?Fact Sheet for Healthcare Providers: ?IncredibleEmployment.be ? ?This test is not yet  approved or cleared by the [place] FDA and ?has been authorized for detection and/or diagnosis of SARS-CoV-2 by ?FDA under an Emergency Use Authorization (EUA). This EUA will remain ?in effect (meaning this test can be

## 2022-01-02 DIAGNOSIS — I6330 Cerebral infarction due to thrombosis of unspecified cerebral artery: Secondary | ICD-10-CM | POA: Diagnosis not present

## 2022-01-02 DIAGNOSIS — E440 Moderate protein-calorie malnutrition: Secondary | ICD-10-CM | POA: Diagnosis not present

## 2022-01-02 DIAGNOSIS — G9340 Encephalopathy, unspecified: Secondary | ICD-10-CM | POA: Diagnosis not present

## 2022-01-02 DIAGNOSIS — J189 Pneumonia, unspecified organism: Secondary | ICD-10-CM | POA: Diagnosis not present

## 2022-01-02 NOTE — Progress Notes (Signed)
Inpatient Rehab Admissions Coordinator:  ? ?I have no beds available for this patient to admit to CIR today.  Will continue to follow for timing of potential admission pending bed availability.   ? ?Shann Medal, PT, DPT ?Admissions Coordinator ?[phone] ?01/02/22  ?12:11 PM ? ?

## 2022-01-02 NOTE — Progress Notes (Signed)
Physical Therapy Treatment ?Patient Details ?Name: Patricia Dudley ?MRN: [id number] ?DOB: 02-Sep-1929 ?Today's Date: 01/02/2022 ? ? ?History of Present Illness Pt is a 88 y/o female presenting on 5/1 with aphasia, L gaze and slurred speech. MRI 5/1 with "positive for acute small vessel infarct at the left internal capsule". CXR 5/1: "Mild airspace disease at the left base, favor atelectasis."PMH includes: afib, CVA, dementia, DM, HTN, GI bleed. ? ?  ?PT Comments  ? ? Patient requires multi-modal cues and at times hand-over-hand guidance to follow commands for functional mobility. Able to stand with +1 assist, however could not progress to gait or to chair due to continuous bowel movement and required cleaning with return to bed (pt continued to be incontinent each time we stood). RN made aware and provided pt with immodium.  ?   ?Recommendations for follow up therapy are one component of a multi-disciplinary discharge planning process, led by the attending physician.  Recommendations may be updated based on patient status, additional functional criteria and insurance authorization. ? ?Follow Up Recommendations ? Acute inpatient rehab (3hours/day) (If not approved, SNF placement) ?  ?  ?Assistance Recommended at Discharge Frequent or constant Supervision/Assistance  ?Patient can return home with the following A lot of help with bathing/dressing/bathroom;Assistance with feeding;Assistance with cooking/housework;Assist for transportation;Direct supervision/assist for financial management;Help with stairs or ramp for entrance;Direct supervision/assist for medications management;Two people to help with walking and/or transfers ?  ?Equipment Recommendations ? Rolling walker (2 wheels);BSC/3in1 (May need w/c depending on level of function at conclusion of rehab)  ?  ?Recommendations for Other Services   ? ? ?  ?Precautions / Restrictions Precautions ?Precautions: Fall ?Restrictions ?Weight Bearing Restrictions: No  ?   ? ?Mobility ? Bed Mobility ?Overal bed mobility: Needs Assistance ?Bed Mobility: Rolling, Sidelying to Sit, Sit to Sidelying ?Rolling: Max assist ?Sidelying to sit: Max assist ?  ?  ?Sit to sidelying: Total assist ?General bed mobility comments: assist for LEs over EOB, pt assisted with push up to raise trunk, assist of bed pad for hips to EOB; to sidelying pt requird guidance for trunk and total assist to raise legs ?  ? ?Transfers ?Overall transfer level: Needs assistance ?Equipment used: None ?Transfers: Sit to/from Stand ?Sit to Stand: Mod assist ?  ?  ?  ?  ?  ?General transfer comment: stood from EOB x 3 reps with each time improving upright posture and time standing (max 90 seconds). Pt incontinent of stool and NT in to assist with cleaning. Pt continuing to go and returned to bed for more complete pericare. ?  ? ?Ambulation/Gait ?  ?  ?  ?  ?  ?  ?  ?General Gait Details: pt with incontinence of bowels; session focused on standing and transfer back to bed for thorough cleaning ? ? ?Stairs ?  ?  ?  ?  ?  ? ? ?Wheelchair Mobility ?  ? ?Modified Rankin (Stroke Patients Only) ?Modified Rankin (Stroke Patients Only) ?Pre-Morbid Rankin Score: Moderate disability ?Modified Rankin: Severe disability ? ? ?  ?Balance Overall balance assessment: Needs assistance ?Sitting-balance support: No upper extremity supported, Feet supported ?Sitting balance-Leahy Scale: Fair ?Sitting balance - Comments: L lateral lean;posterior ?Postural control: Posterior lean ?Standing balance support: Bilateral upper extremity supported, During functional activity ?Standing balance-Leahy Scale: Poor ?Standing balance comment: B UE support with strong posterior lean ?  ?  ?  ?  ?  ?  ?  ?  ?  ?  ?  ?  ? ?  ?  Cognition Arousal/Alertness: Awake/alert ?Behavior During Therapy: [facility] for tasks assessed/performed (smiles) ?Overall Cognitive Status: Difficult to assess ?Area of Impairment: Following commands, Problem solving ?  ?  ?  ?  ?  ?  ?  ?  ?   ?  ?  ?Following Commands: Follows one step commands inconsistently (with multimodal cues) ?  ?  ?Problem Solving: Decreased initiation, Requires verbal cues, Requires tactile cues ?General Comments: pt non verbal, has hearing aids ?  ?  ? ?  ?Exercises   ? ?  ?General Comments   ?  ?  ? ?Pertinent Vitals/Pain Pain Assessment ?Faces Pain Scale: No hurt ?Breathing: normal ?Negative Vocalization: none ?Facial Expression: smiling or inexpressive ?Body Language: relaxed ?Consolability: no need to console ?PAINAD Score: 0  ? ? ?Home Living   ?  ?  ?  ?  ?  ?  ?  ?  ?  ?   ?  ?Prior Function    ?  ?  ?   ? ?PT Goals (current goals can now be found in the care plan section) Acute Rehab PT Goals ?Patient Stated Goal: non verbal ?Time For Goal Achievement: 01/08/22 ?Potential to Achieve Goals: Good ?Progress towards PT goals: Not progressing toward goals - comment (limited by continued diarrhea) ? ?  ?Frequency ? ? ? Min 4X/week ? ? ? ?  ?PT Plan Current plan remains appropriate  ? ? ?Co-evaluation   ?  ?  ?  ?  ? ?  ?AM-PAC PT "6 Clicks" Mobility   ?Outcome Measure ? Help needed turning from your back to your side while in a flat bed without using bedrails?: A Lot ?Help needed moving from lying on your back to sitting on the side of a flat bed without using bedrails?: Total ?Help needed moving to and from a bed to a chair (including a wheelchair)?: A Lot ?Help needed standing up from a chair using your arms (e.g., wheelchair or bedside chair)?: A Lot ?Help needed to walk in hospital room?: Total ?Help needed climbing 3-5 steps with a railing? : Total ?6 Click Score: 9 ? ?  ?End of Session Equipment Utilized During Treatment: Gait belt ?Activity Tolerance: Treatment limited secondary to medical complications (Comment) (limited by diarrhea) ?Patient left: in bed;with call bell/phone within reach;with bed alarm set ?Nurse Communication: Mobility status;Other (comment) (incontinent of bowels, diarrhea) ?PT Visit Diagnosis:  Muscle weakness (generalized) (M62.81);Other abnormalities of gait and mobility (R26.89);Hemiplegia and hemiparesis ?Hemiplegia - Right/Left: Right ?Hemiplegia - dominant/non-dominant: Dominant ?Hemiplegia - caused by: Cerebral infarction ?  ? ? ?Time: 5329-9242 ?PT Time Calculation (min) (ACUTE ONLY): 36 min ? ?Charges:  $Gait Training: 8-22 mins ?$Therapeutic Activity: 8-22 mins          ?          ? ? ?Arby Barrette, PT ?Acute Rehabilitation Services  ?Pager [phone] ?Office [phone] ? ? ? ?Jeanie Cooks Osbaldo Mark ?01/02/2022, 1:06 PM ? ?

## 2022-01-02 NOTE — Progress Notes (Signed)
?PROGRESS NOTE ? ? ? CAREEN MAUCH  DXA:[id number] DOB: Feb 03, 1930 DOA: 12/24/2021 ?PCP: Tracie Harrier, MD  ? ?Chief Complaint  ?Patient presents with  ? Code Stroke  ? ? ?Brief Narrative:  ?83 female with past medical history of CKD stage III B, hypothyroidism, paroxysmal A-fib not on anticoagulation due to to GI bleed (AVM duodenum-3 units PRBC-September 2018), diabetes mellitus, hypertension, Hypothyroid-presented to the hospital with aphasia, left gaze preference, slurred speech-upon further evaluation-she was found to have acute CVA.    ? ?Assessment & Plan: ?Acute CVA: Left internal capsule infarct on MRI-likely due to small vessel disease, but given history of A-fib-possibly cardioembolic-but felt to be unlikely.  Remains aphasic-but awake-awaiting CIR bed at this point.  MRI brain without any significant stenosis.  Carotid Doppler without any significant stenosis, echo with preserved EF, LDL 110, A1c 6.5.  Given prior history of severe GI bleeding-recommendations are to continue aspirin.  ? ?Aspiration PNA: Has completed a course of antimicrobial therapy. ? ?Acute metabolic encephalopathy: Due to combination of PNA/CVA-she is improved. ? ?AKI: Hemodynamically mediated-improved with supportive care-creatinine back to baseline. ? ?Hypernatremia: Resolved with IVF. ? ?PAF: Maintaining sinus rhythm-on metoprolol-not felt to be a appropriate anticoagulation candidate given history of prior severe GI bleeding requiring PRBC transfusion. ? ?HTN: BP stable-continue metoprolol ? ?HLD: Continue statin ? ?Hypothyroidism: Continue Synthroid ? ?History of GI bleeding secondary to AVMs: No obvious bleeding ? ?Nutrition Status: ?Nutrition Problem: Moderate Malnutrition ?Etiology: chronic illness ?Signs/Symptoms: mild muscle depletion, mild fat depletion, percent weight loss ?Percent weight loss: 9 % ?Interventions: Ensure Enlive (each supplement provides 350kcal and 20 grams of protein), MVI ?  ? ?DVT prophylaxis:  [place] heparin ?Code Status: DNR ?Family Communication: none present ?Disposition: CIR ? ?Status is: Inpatient ?Remains inpatient appropriate, patient is medically stable for discharge to CIR once bed is available ?  ?Consultants:  ?Neurology ? ?Subjective: ?Awake-smiles-but does not speak. ? ?Objective: ?Vitals:  ? 01/01/22 2000 01/01/22 2310 01/02/22 2094 01/02/22 0809  ?BP: (!) 151/66 139/77 (!) 162/65 (!) 121/55  ?Pulse: 80 97 79 87  ?Resp: 16 16 19 19   ?Temp: 99 ?F (37.2 ?C) 99.1 ?F (37.3 ?C) 98.3 ?F (36.8 ?C) 98.1 ?F (36.7 ?C)  ?TempSrc: Oral Oral Oral Oral  ?SpO2:  96% 93% 99%  ?Weight:      ?Height:      ? ?No intake or output data in the 24 hours ending 01/02/22 1122 ? ?Filed Weights  ? 12/24/21 1437 12/24/21 1453 12/25/21 1446  ?Weight: 59.8 kg 59.4 kg 56.8 kg  ? ? ?Examination: ?Gen Exam:Alert awake-not in any distress ?HEENT:atraumatic, normocephalic ?Chest: B/L clear to auscultation anteriorly ?CVS:S1S2 regular ?Abdomen:soft non tender, non distended ?Extremities:no edema ?Skin: no rash  ? ? ?Data Reviewed: I have personally reviewed following labs and imaging studies ? ?CBC: ?Recent Labs  ?Lab 12/27/21 ?0238  ?WBC 8.5  ?HGB 10.0*  ?HCT 30.3*  ?MCV 83.7  ?PLT 218  ? ? ? ?Basic Metabolic Panel: ?Recent Labs  ?Lab 12/27/21 ?0238 12/29/21 ?0100  ?NA 143 140  ?K 3.6 3.8  ?CL 113* 109  ?CO2 23 26  ?GLUCOSE 144* 122*  ?BUN 14 15  ?CREATININE 1.92* 1.76*  ?CALCIUM 8.2* 8.6*  ?MG  --  1.8  ? ? ?GFR: ?Estimated Creatinine Clearance: 19.4 mL/min (A) (by C-G formula based on SCr of 1.76 mg/dL (H)). ? ?Liver Function Tests: ?No results for input(s): AST, ALT, ALKPHOS, BILITOT, PROT, ALBUMIN in the last 168 hours. ? ?CBG: ?Recent Labs  ?  Lab 12/30/21 ?1349 12/30/21 ?2101 12/31/21 ?0727 12/31/21 ?1152 12/31/21 ?1553  ?GLUCAP 222* 158* 136* 143* 175*  ? ? ? ?Recent Results (from the past 240 hour(s))  ?Resp Panel by RT-PCR (Flu A&B, Covid) Peripheral     Status: None  ? Collection Time: 12/24/21  2:30 PM  ? Specimen:  Peripheral; Nasopharyngeal(NP) swabs in vial transport medium  ?Result Value Ref Range Status  ? SARS Coronavirus 2 by RT PCR NEGATIVE NEGATIVE Final  ?  Comment: (NOTE) ?SARS-CoV-2 target nucleic acids are NOT DETECTED. ? ?The SARS-CoV-2 RNA is generally detectable in upper respiratory ?specimens during the acute phase of infection. The lowest ?concentration of SARS-CoV-2 viral copies this assay can detect is ?138 copies/mL. A negative result does not preclude SARS-Cov-2 ?infection and should not be used as the sole basis for treatment or ?other patient management decisions. A negative result may occur with  ?improper specimen collection/handling, submission of specimen other ?than nasopharyngeal swab, presence of viral mutation(s) within the ?areas targeted by this assay, and inadequate number of viral ?copies(<138 copies/mL). A negative result must be combined with ?clinical observations, patient history, and epidemiological ?information. The expected result is Negative. ? ?Fact Sheet for Patients:  ?EntrepreneurPulse.com.au ? ?Fact Sheet for Healthcare Providers:  ?IncredibleEmployment.be ? ?This test is no t yet approved or cleared by the [place] FDA and  ?has been authorized for detection and/or diagnosis of SARS-CoV-2 by ?FDA under an Emergency Use Authorization (EUA). This EUA will remain  ?in effect (meaning this test can be used) for the duration of the ?COVID-19 declaration under Section 564(b)(1) of the Act, 21 ?U.S.C.section 360bbb-3(b)(1), unless the authorization is terminated  ?or revoked sooner.  ? ? ?  ? Influenza A by PCR NEGATIVE NEGATIVE Final  ? Influenza B by PCR NEGATIVE NEGATIVE Final  ?  Comment: (NOTE) ?The Xpert Xpress SARS-CoV-2/FLU/RSV plus assay is intended as an aid ?in the diagnosis of influenza from Nasopharyngeal swab specimens and ?should not be used as a sole basis for treatment. Nasal washings and ?aspirates are unacceptable for Xpert  Xpress SARS-CoV-2/FLU/RSV ?testing. ? ?Fact Sheet for Patients: ?EntrepreneurPulse.com.au ? ?Fact Sheet for Healthcare Providers: ?IncredibleEmployment.be ? ?This test is not yet approved or cleared by the [place] FDA and ?has been authorized for detection and/or diagnosis of SARS-CoV-2 by ?FDA under an Emergency Use Authorization (EUA). This EUA will remain ?in effect (meaning this test can be used) for the duration of the ?COVID-19 declaration under Section 564(b)(1) of the Act, 21 U.S.C. ?section 360bbb-3(b)(1), unless the authorization is terminated or ?revoked. ? ?Performed at [facility], [place] [address], [place], [place] ?[address] ?  ?Urine Culture     Status: None  ? Collection Time: 12/24/21  3:07 PM  ? Specimen: Urine, Catheterized  ?Result Value Ref Range Status  ? Specimen Description URINE, CATHETERIZED  Final  ? Special Requests NONE  Final  ? Culture   Final  ?  NO GROWTH ?Performed at [facility], [place] [address], [place], [place] [address] ?  ? Report Status 12/25/2021 FINAL  Final  ?Blood culture (routine x 2)     Status: None  ? Collection Time: 12/24/21  3:13 PM  ? Specimen: BLOOD  ?Result Value Ref Range Status  ? Specimen Description BLOOD LEFT ANTECUBITAL  Final  ? Special Requests   Final  ?  BOTTLES DRAWN AEROBIC AND ANAEROBIC Blood Culture adequate volume  ? Culture   Final  ?  NO GROWTH 5  DAYS ?Performed at [facility], [place] [address] [place], [place] [address] ?  ? Report Status 12/29/2021 FINAL  Final  ?Blood culture (routine x 2)     Status: None  ? Collection Time: 12/24/21  5:00 PM  ? Specimen: BLOOD  ?Result Value Ref Range Status  ? Specimen Description BLOOD LEFT ANTECUBITAL  Final  ? Special Requests   Final  ?  BOTTLES DRAWN AEROBIC AND ANAEROBIC Blood Culture results may not be optimal due to an inadequate volume of blood received in culture bottles  ? Culture   Final  ?  NO GROWTH 5 DAYS ?Performed at [facility], [place] [address] [place], [place] [address] ?  ? Report Status 12/29/2021 FINAL  Final  ? ?  ? ?Radiology Studies: ?No results found. ? ? ?Scheduled Meds: ? amLODipine  10 mg Oral Daily  ? aspirin EC  81 m

## 2022-01-02 NOTE — Progress Notes (Signed)
Occupational Therapy Treatment ?Patient Details ?Name: Patricia Dudley ?MRN: [id number] ?DOB: 1930-06-25 ?Today's Date: 01/02/2022 ? ? ?History of present illness Pt is a 88 y/o female presenting on 5/1 with aphasia, L gaze and slurred speech. MRI 5/1 with "positive for acute small vessel infarct at the left internal capsule". CXR 5/1: "Mild airspace disease at the left base, favor atelectasis."PMH includes: afib, CVA, dementia, DM, HTN, GI bleed. ?  ?OT comments ? Focus of session on bed mobility, sitting balance at EOB and self feeding. Pt requiring max assist for feeding. Demonstrated LOB posteriorly intermittently at EOB. Pt with urinary incontinence, assisted to change linens and clothing. Making slow gains. Supportive daughters at bedside.   ? ?Recommendations for follow up therapy are one component of a multi-disciplinary discharge planning process, led by the attending physician.  Recommendations may be updated based on patient status, additional functional criteria and insurance authorization. ?   ?Follow Up Recommendations ? Acute inpatient rehab (3hours/day)  ?  ?Assistance Recommended at Discharge Frequent or constant Supervision/Assistance  ?Patient can return home with the following ? Two people to help with walking and/or transfers;Two people to help with bathing/dressing/bathroom;Assistance with cooking/housework;Assistance with feeding;Direct supervision/assist for financial management;Direct supervision/assist for medications management;Assist for transportation;Help with stairs or ramp for entrance ?  ?Equipment Recommendations ? Other (comment) (defer to next venue)  ?  ?Recommendations for Other Services   ? ?  ?Precautions / Restrictions Precautions ?Precautions: Fall ?Restrictions ?Weight Bearing Restrictions: No  ? ? ?  ? ?Mobility Bed Mobility ?Overal bed mobility: Needs Assistance ?Bed Mobility: Supine to Sit, Sit to Supine, Rolling ?Rolling: Max assist ?  ?Supine to sit: Max assist ?Sit to  supine: Total assist ?  ?  ?  ? ?Transfers ?  ?  ?  ?  ?  ?  ?  ?  ?  ?  ?  ?  ?Balance Overall balance assessment: Needs assistance ?Sitting-balance support: No upper extremity supported, Feet supported ?Sitting balance-Leahy Scale: Poor ?Sitting balance - Comments: posterior LOB at times ?  ?  ?  ?  ?  ?  ?  ?  ?  ?  ?  ?  ?  ?  ?  ?   ? ?ADL either performed or assessed with clinical judgement  ? ?ADL Overall ADL's : Needs assistance/impaired ?Eating/Feeding: Maximal assistance;Sitting ?Eating/Feeding Details (indicate cue type and reason): worked on self feeding and drinking at EOB ?Grooming: Wash/dry hands;Sitting;Total assistance ?  ?  ?  ?Lower Body Bathing: Total assistance;Bed level ?  ?Upper Body Dressing : Total assistance;Bed level ?  ?Lower Body Dressing: Total assistance;Bed level ?  ?  ?  ?Toileting- Clothing Manipulation and Hygiene: Total assistance;Bed level ?  ?  ?  ?  ?General ADL Comments: urinary incontinence ?  ? ?Extremity/Trunk Assessment   ?  ?  ?  ?  ?  ? ?Vision   ?  ?  ?Perception   ?  ?Praxis   ?  ? ?Cognition Arousal/Alertness: Awake/alert ?Behavior During Therapy: [facility] for tasks assessed/performed (smiles) ?Overall Cognitive Status: Impaired/Different from baseline ?Area of Impairment: Following commands, Problem solving, Attention, Memory ?  ?  ?  ?  ?  ?  ?  ?  ?  ?Current Attention Level: Focused ?Memory: Decreased short-term memory (baseline memory deficits) ?Following Commands:  (no true command following noted) ?Safety/Judgement: Decreased awareness of safety, Decreased awareness of deficits ?  ?Problem Solving: Decreased initiation, Requires verbal cues, Requires tactile cues ?General Comments:  non verbal ?  ?  ?   ?Exercises   ? ?  ?Shoulder Instructions   ? ? ?  ?General Comments    ? ? ?Pertinent Vitals/ Pain       Pain Assessment ?Pain Assessment: Faces ?Faces Pain Scale: No hurt ? ?Home Living   ?  ?  ?  ?  ?  ?  ?  ?  ?  ?  ?  ?  ?  ?  ?  ?  ?  ?  ? ?  ?Prior  Functioning/Environment    ?  ?  ?  ?   ? ?Frequency ? Min 2X/week  ? ? ? ? ?  ?Progress Toward Goals ? ?OT Goals(current goals can now be found in the care plan section) ? Progress towards OT goals: Progressing toward goals ? ?Acute Rehab OT Goals ?OT Goal Formulation: Patient unable to participate in goal setting ?Time For Goal Achievement: 01/08/22 ?Potential to Achieve Goals: Fair  ?Plan Frequency remains appropriate;Discharge plan remains appropriate   ? ?Co-evaluation ? ? ?   ?  ?  ?  ?  ? ?  ?AM-PAC OT "6 Clicks" Daily Activity     ?Outcome Measure ? ? Help from another person eating meals?: A Lot ?Help from another person taking care of personal grooming?: Total ?Help from another person toileting, which includes using toliet, bedpan, or urinal?: Total ?Help from another person bathing (including washing, rinsing, drying)?: Total ?Help from another person to put on and taking off regular upper body clothing?: Total ?Help from another person to put on and taking off regular lower body clothing?: Total ?6 Click Score: 7 ? ?  ?End of Session   ? ?OT Visit Diagnosis: Other abnormalities of gait and mobility (R26.89);Muscle weakness (generalized) (M62.81);Other symptoms and signs involving cognitive function;Cognitive communication deficit (R41.841) ?Symptoms and signs involving cognitive functions: Cerebral infarction ?  ?Activity Tolerance Patient tolerated treatment well ?  ?Patient Left in bed;with call bell/phone within reach;with bed alarm set;with family/visitor present ?  ?Nurse Communication   ?  ? ?   ? ?Time: 4081-4481 ?OT Time Calculation (min): 60 min ? ?Charges: OT General Charges ?$OT Visit: 1 Visit ?OT Treatments ?$Self Care/Home Management : 53-67 mins ? ?Nestor Lewandowsky, OTR/L ?Acute Rehabilitation Services ?Pager: [phone] ?Office: [phone]  ? ?Malka So ?01/02/2022, 4:00 PM ?

## 2022-01-03 ENCOUNTER — Encounter (HOSPITAL_COMMUNITY): Payer: Self-pay | Admitting: Physical Medicine and Rehabilitation

## 2022-01-03 ENCOUNTER — Encounter (HOSPITAL_COMMUNITY): Payer: Self-pay | Admitting: Family Medicine

## 2022-01-03 ENCOUNTER — Other Ambulatory Visit: Payer: Self-pay

## 2022-01-03 ENCOUNTER — Inpatient Hospital Stay (HOSPITAL_COMMUNITY)
Admission: RE | Admit: 2022-01-03 | Discharge: 2022-01-31 | DRG: 057 | Disposition: A | Discharge status: Skilled Nursing Facility | Payer: Medicare PPO | Source: Intra-hospital | Attending: Physical Medicine and Rehabilitation | Admitting: Physical Medicine and Rehabilitation

## 2022-01-03 DIAGNOSIS — I69391 Dysphagia following cerebral infarction: Secondary | ICD-10-CM | POA: Diagnosis not present

## 2022-01-03 DIAGNOSIS — I480 Paroxysmal atrial fibrillation: Secondary | ICD-10-CM | POA: Diagnosis present

## 2022-01-03 DIAGNOSIS — Z66 Do not resuscitate: Secondary | ICD-10-CM | POA: Diagnosis present

## 2022-01-03 DIAGNOSIS — E785 Hyperlipidemia, unspecified: Secondary | ICD-10-CM | POA: Diagnosis present

## 2022-01-03 DIAGNOSIS — I10 Essential (primary) hypertension: Secondary | ICD-10-CM | POA: Diagnosis not present

## 2022-01-03 DIAGNOSIS — N179 Acute kidney failure, unspecified: Secondary | ICD-10-CM | POA: Diagnosis not present

## 2022-01-03 DIAGNOSIS — R627 Adult failure to thrive: Secondary | ICD-10-CM | POA: Diagnosis present

## 2022-01-03 DIAGNOSIS — Z888 Allergy status to other drugs, medicaments and biological substances status: Secondary | ICD-10-CM

## 2022-01-03 DIAGNOSIS — Z6820 Body mass index (BMI) 20.0-20.9, adult: Secondary | ICD-10-CM

## 2022-01-03 DIAGNOSIS — N189 Chronic kidney disease, unspecified: Secondary | ICD-10-CM | POA: Diagnosis not present

## 2022-01-03 DIAGNOSIS — Z823 Family history of stroke: Secondary | ICD-10-CM

## 2022-01-03 DIAGNOSIS — E875 Hyperkalemia: Secondary | ICD-10-CM | POA: Diagnosis not present

## 2022-01-03 DIAGNOSIS — Z91041 Radiographic dye allergy status: Secondary | ICD-10-CM

## 2022-01-03 DIAGNOSIS — I129 Hypertensive chronic kidney disease with stage 1 through stage 4 chronic kidney disease, or unspecified chronic kidney disease: Secondary | ICD-10-CM | POA: Diagnosis present

## 2022-01-03 DIAGNOSIS — Z79899 Other long term (current) drug therapy: Secondary | ICD-10-CM | POA: Diagnosis not present

## 2022-01-03 DIAGNOSIS — R1310 Dysphagia, unspecified: Secondary | ICD-10-CM | POA: Diagnosis present

## 2022-01-03 DIAGNOSIS — D649 Anemia, unspecified: Secondary | ICD-10-CM | POA: Diagnosis present

## 2022-01-03 DIAGNOSIS — N184 Chronic kidney disease, stage 4 (severe): Secondary | ICD-10-CM | POA: Diagnosis not present

## 2022-01-03 DIAGNOSIS — R001 Bradycardia, unspecified: Secondary | ICD-10-CM | POA: Diagnosis not present

## 2022-01-03 DIAGNOSIS — F0390 Unspecified dementia without behavioral disturbance: Secondary | ICD-10-CM | POA: Diagnosis present

## 2022-01-03 DIAGNOSIS — R159 Full incontinence of feces: Secondary | ICD-10-CM | POA: Diagnosis present

## 2022-01-03 DIAGNOSIS — E871 Hypo-osmolality and hyponatremia: Secondary | ICD-10-CM | POA: Diagnosis not present

## 2022-01-03 DIAGNOSIS — Z7189 Other specified counseling: Secondary | ICD-10-CM | POA: Diagnosis not present

## 2022-01-03 DIAGNOSIS — N183 Chronic kidney disease, stage 3 unspecified: Secondary | ICD-10-CM | POA: Diagnosis not present

## 2022-01-03 DIAGNOSIS — Z8249 Family history of ischemic heart disease and other diseases of the circulatory system: Secondary | ICD-10-CM | POA: Diagnosis not present

## 2022-01-03 DIAGNOSIS — I639 Cerebral infarction, unspecified: Secondary | ICD-10-CM | POA: Diagnosis not present

## 2022-01-03 DIAGNOSIS — E870 Hyperosmolality and hypernatremia: Secondary | ICD-10-CM | POA: Diagnosis present

## 2022-01-03 DIAGNOSIS — E1122 Type 2 diabetes mellitus with diabetic chronic kidney disease: Secondary | ICD-10-CM | POA: Diagnosis present

## 2022-01-03 DIAGNOSIS — E559 Vitamin D deficiency, unspecified: Secondary | ICD-10-CM | POA: Diagnosis present

## 2022-01-03 DIAGNOSIS — Z7982 Long term (current) use of aspirin: Secondary | ICD-10-CM

## 2022-01-03 DIAGNOSIS — I950 Idiopathic hypotension: Secondary | ICD-10-CM | POA: Diagnosis not present

## 2022-01-03 DIAGNOSIS — N1832 Chronic kidney disease, stage 3b: Secondary | ICD-10-CM | POA: Diagnosis present

## 2022-01-03 DIAGNOSIS — E440 Moderate protein-calorie malnutrition: Secondary | ICD-10-CM | POA: Diagnosis present

## 2022-01-03 DIAGNOSIS — N319 Neuromuscular dysfunction of bladder, unspecified: Secondary | ICD-10-CM | POA: Diagnosis present

## 2022-01-03 DIAGNOSIS — Z515 Encounter for palliative care: Secondary | ICD-10-CM | POA: Diagnosis not present

## 2022-01-03 DIAGNOSIS — R609 Edema, unspecified: Secondary | ICD-10-CM | POA: Diagnosis not present

## 2022-01-03 DIAGNOSIS — D500 Iron deficiency anemia secondary to blood loss (chronic): Secondary | ICD-10-CM | POA: Diagnosis not present

## 2022-01-03 DIAGNOSIS — I6330 Cerebral infarction due to thrombosis of unspecified cerebral artery: Secondary | ICD-10-CM | POA: Diagnosis not present

## 2022-01-03 DIAGNOSIS — H409 Unspecified glaucoma: Secondary | ICD-10-CM | POA: Diagnosis present

## 2022-01-03 DIAGNOSIS — Z833 Family history of diabetes mellitus: Secondary | ICD-10-CM

## 2022-01-03 DIAGNOSIS — Z79890 Hormone replacement therapy (postmenopausal): Secondary | ICD-10-CM

## 2022-01-03 DIAGNOSIS — J189 Pneumonia, unspecified organism: Secondary | ICD-10-CM | POA: Diagnosis not present

## 2022-01-03 DIAGNOSIS — K5520 Angiodysplasia of colon without hemorrhage: Secondary | ICD-10-CM | POA: Diagnosis present

## 2022-01-03 DIAGNOSIS — I69320 Aphasia following cerebral infarction: Secondary | ICD-10-CM | POA: Diagnosis present

## 2022-01-03 DIAGNOSIS — E039 Hypothyroidism, unspecified: Secondary | ICD-10-CM | POA: Diagnosis present

## 2022-01-03 DIAGNOSIS — K219 Gastro-esophageal reflux disease without esophagitis: Secondary | ICD-10-CM | POA: Diagnosis present

## 2022-01-03 DIAGNOSIS — G9340 Encephalopathy, unspecified: Secondary | ICD-10-CM | POA: Diagnosis not present

## 2022-01-03 DIAGNOSIS — I9589 Other hypotension: Secondary | ICD-10-CM | POA: Diagnosis not present

## 2022-01-03 LAB — GLUCOSE, CAPILLARY
Glucose-Capillary: 162 mg/dL — ABNORMAL HIGH (ref 70–99)
Glucose-Capillary: 202 mg/dL — ABNORMAL HIGH (ref 70–99)
Glucose-Capillary: 187 mg/dL — ABNORMAL HIGH (ref 70–99)
Glucose-Capillary: 171 mg/dL — ABNORMAL HIGH (ref 70–99)

## 2022-01-03 MED ORDER — ADULT MULTIVITAMIN W/MINERALS CH
1.0000 | ORAL_TABLET | Freq: Every day | ORAL | Status: DC
  Administered 2022-01-04 – 2022-01-31 (×28): 1 via ORAL
  Filled 2022-01-03 (×28): qty 1

## 2022-01-03 MED ORDER — GERHARDT'S BUTT CREAM
TOPICAL_CREAM | Freq: Four times a day (QID) | CUTANEOUS | Status: DC
  Filled 2022-01-03 (×3): qty 1

## 2022-01-03 MED ORDER — ACETAMINOPHEN 325 MG PO TABS: 325.0000 mg | ORAL_TABLET | ORAL | Status: DC | PRN

## 2022-01-03 MED ORDER — LATANOPROST 0.005 % OP SOLN
1.0000 [drp] | Freq: Every day | OPHTHALMIC | Status: DC
  Administered 2022-01-03 – 2022-01-30 (×28): 1 [drp] via OPHTHALMIC
  Filled 2022-01-03: qty 2.5

## 2022-01-03 MED ORDER — LIVING WELL WITH DIABETES BOOK
Freq: Once | Status: AC
  Filled 2022-01-03: qty 1

## 2022-01-03 MED ORDER — HEPARIN SODIUM (PORCINE) 5000 UNIT/ML IJ SOLN: 5000.0000 [IU] | Freq: Three times a day (TID) | INTRAMUSCULAR | Status: DC

## 2022-01-03 MED ORDER — HEPARIN SODIUM (PORCINE) 5000 UNIT/ML IJ SOLN
5000.0000 [IU] | Freq: Three times a day (TID) | INTRAMUSCULAR | Status: DC
  Administered 2022-01-03 – 2022-01-31 (×82): 5000 [IU] via SUBCUTANEOUS
  Filled 2022-01-03 (×81): qty 1

## 2022-01-03 MED ORDER — FLEET ENEMA 7-19 GM/118ML RE ENEM: 1.0000 | ENEMA | Freq: Once | RECTAL | Status: DC | PRN

## 2022-01-03 MED ORDER — ROSUVASTATIN CALCIUM 10 MG PO TABS: 10.0000 mg | ORAL_TABLET | Freq: Every day | ORAL | Status: DC

## 2022-01-03 MED ORDER — INSULIN ASPART 100 UNIT/ML IJ SOLN
INTRAMUSCULAR | 11 refills | Status: DC
Start: 2022-01-03 — End: 2022-01-31

## 2022-01-03 MED ORDER — DIPHENHYDRAMINE HCL 12.5 MG/5ML PO ELIX: 12.5000 mg | ORAL_SOLUTION | Freq: Four times a day (QID) | ORAL | Status: DC | PRN

## 2022-01-03 MED ORDER — SORBITOL 70 % SOLN: 30.0000 mL | Freq: Every day | Status: DC | PRN

## 2022-01-03 MED ORDER — METOPROLOL TARTRATE 50 MG PO TABS: 50.0000 mg | ORAL_TABLET | Freq: Two times a day (BID) | ORAL | Status: DC

## 2022-01-03 MED ORDER — ASPIRIN 81 MG PO TBEC: 81.0000 mg | DELAYED_RELEASE_TABLET | Freq: Every day | ORAL | 11 refills | Status: DC

## 2022-01-03 MED ORDER — LISINOPRIL 20 MG PO TABS
20.0000 mg | ORAL_TABLET | Freq: Every day | ORAL | Status: DC
Start: 2022-01-04 — End: 2022-01-04
  Administered 2022-01-04: 20 mg via ORAL
  Filled 2022-01-03: qty 1

## 2022-01-03 MED ORDER — ENSURE ENLIVE PO LIQD
237.0000 mL | Freq: Two times a day (BID) | ORAL | Status: DC
  Administered 2022-01-04 – 2022-01-07 (×8): 237 mL via ORAL

## 2022-01-03 MED ORDER — AMLODIPINE BESYLATE 5 MG PO TABS: 10.0000 mg | ORAL_TABLET | Freq: Every day | ORAL | 0 refills | Status: DC

## 2022-01-03 MED ORDER — LEVOTHYROXINE SODIUM 112 MCG PO TABS
112.0000 ug | ORAL_TABLET | Freq: Every day | ORAL | Status: DC
  Administered 2022-01-04 – 2022-01-31 (×28): 112 ug via ORAL
  Filled 2022-01-03 (×28): qty 1

## 2022-01-03 MED ORDER — TRAZODONE HCL 50 MG PO TABS
25.0000 mg | ORAL_TABLET | Freq: Every evening | ORAL | Status: DC | PRN
  Administered 2022-01-05: 50 mg via ORAL
  Filled 2022-01-03: qty 1

## 2022-01-03 MED ORDER — PROCHLORPERAZINE MALEATE 5 MG PO TABS: 5.0000 mg | ORAL_TABLET | Freq: Four times a day (QID) | ORAL | Status: DC | PRN

## 2022-01-03 MED ORDER — PROCHLORPERAZINE EDISYLATE 10 MG/2ML IJ SOLN: 5.0000 mg | Freq: Four times a day (QID) | INTRAMUSCULAR | Status: DC | PRN

## 2022-01-03 MED ORDER — AMLODIPINE BESYLATE 10 MG PO TABS
10.0000 mg | ORAL_TABLET | Freq: Every day | ORAL | Status: DC
  Administered 2022-01-04 – 2022-01-31 (×27): 10 mg via ORAL
  Filled 2022-01-03 (×29): qty 1

## 2022-01-03 MED ORDER — ROSUVASTATIN CALCIUM 5 MG PO TABS
10.0000 mg | ORAL_TABLET | Freq: Every day | ORAL | Status: DC
Start: 2022-01-04 — End: 2022-01-07
  Administered 2022-01-04 – 2022-01-07 (×4): 10 mg via ORAL
  Filled 2022-01-03 (×5): qty 2

## 2022-01-03 MED ORDER — VITAMIN B-12 100 MCG PO TABS
100.0000 ug | ORAL_TABLET | Freq: Every day | ORAL | Status: DC
  Administered 2022-01-04 – 2022-01-31 (×28): 100 ug via ORAL
  Filled 2022-01-03 (×28): qty 1

## 2022-01-03 MED ORDER — INSULIN ASPART 100 UNIT/ML IJ SOLN
0.0000 [IU] | Freq: Three times a day (TID) | INTRAMUSCULAR | Status: DC
  Administered 2022-01-03: 2 [IU] via SUBCUTANEOUS
  Administered 2022-01-04: 3 [IU] via SUBCUTANEOUS
  Administered 2022-01-04: 2 [IU] via SUBCUTANEOUS
  Administered 2022-01-04: 3 [IU] via SUBCUTANEOUS
  Administered 2022-01-05: 2 [IU] via SUBCUTANEOUS
  Administered 2022-01-05: 3 [IU] via SUBCUTANEOUS
  Administered 2022-01-05 – 2022-01-06 (×3): 5 [IU] via SUBCUTANEOUS
  Administered 2022-01-06: 2 [IU] via SUBCUTANEOUS

## 2022-01-03 MED ORDER — LOPERAMIDE HCL 2 MG PO CAPS
2.0000 mg | ORAL_CAPSULE | ORAL | Status: DC | PRN
  Administered 2022-01-09: 2 mg via ORAL
  Filled 2022-01-03: qty 1

## 2022-01-03 MED ORDER — METOPROLOL TARTRATE 50 MG PO TABS
50.0000 mg | ORAL_TABLET | Freq: Two times a day (BID) | ORAL | Status: DC
Start: 2022-01-03 — End: 2022-01-04
  Administered 2022-01-03 – 2022-01-04 (×2): 50 mg via ORAL
  Filled 2022-01-03 (×2): qty 1

## 2022-01-03 MED ORDER — GUAIFENESIN-DM 100-10 MG/5ML PO SYRP: 5.0000 mL | ORAL_SOLUTION | Freq: Four times a day (QID) | ORAL | Status: DC | PRN

## 2022-01-03 MED ORDER — ASPIRIN 81 MG PO TBEC
81.0000 mg | DELAYED_RELEASE_TABLET | Freq: Every day | ORAL | Status: DC
  Administered 2022-01-03 – 2022-01-30 (×27): 81 mg via ORAL
  Filled 2022-01-03 (×28): qty 1

## 2022-01-03 MED ORDER — SENNOSIDES-DOCUSATE SODIUM 8.6-50 MG PO TABS: 1.0000 | ORAL_TABLET | Freq: Every evening | ORAL | Status: DC | PRN

## 2022-01-03 MED ORDER — BLOOD PRESSURE CONTROL BOOK
Freq: Once | Status: AC
  Filled 2022-01-03: qty 1

## 2022-01-03 MED ORDER — METHOCARBAMOL 500 MG PO TABS: 500.0000 mg | ORAL_TABLET | Freq: Four times a day (QID) | ORAL | Status: DC | PRN

## 2022-01-03 MED ORDER — PROCHLORPERAZINE 25 MG RE SUPP
12.5000 mg | Freq: Four times a day (QID) | RECTAL | Status: DC | PRN
Start: 2022-01-03 — End: 2022-01-31

## 2022-01-03 MED ORDER — ALUM & MAG HYDROXIDE-SIMETH 200-200-20 MG/5ML PO SUSP: 30.0000 mL | ORAL | Status: DC | PRN

## 2022-01-03 MED ORDER — ENSURE ENLIVE PO LIQD
237.0000 mL | Freq: Two times a day (BID) | ORAL | 12 refills | Status: DC
Start: 2022-01-03 — End: 2022-01-31

## 2022-01-03 MED ORDER — INSULIN ASPART 100 UNIT/ML IJ SOLN
0.0000 [IU] | Freq: Three times a day (TID) | INTRAMUSCULAR | Status: DC
  Administered 2022-01-03: 3 [IU] via SUBCUTANEOUS

## 2022-01-03 NOTE — Progress Notes (Addendum)
Arrived to the unit accompanied by staff and  patient's daughters. Oriented to unit and room. Admission/Assessments completed with patient and daughters. ?

## 2022-01-03 NOTE — PMR Pre-admission (Signed)
PMR Admission Coordinator Pre-Admission Assessment ? ?Patient: Patricia Dudley is an 88 y.o., female ?MRN: [id number] ?DOB: [birth year] ?Height: 5' 5"  (165.1 cm) ?Weight: 56.8 kg ? ?Insurance Information ?HMO:     PPO: yes     PCP:      IPA:      80/20:      OTHER:  ?PRIMARY: [employer]      Policy#: [id number]      Subscriber: pt ?CM Name: Sunna      Phone#: faxed approval     Fax#: [phone] ?Pre-Cert#: [id number] auth for CIR via faxed approval with updates due to fax listed above on 5/17      Employer:  ?Benefits:  Phone #: [phone]     Name:  ?Eff. Date: 08/27/19     Deduct: $0      Out of Pocket Max: $4500 (met $560)      Life Max: n/a ?CIR: $160/day for days 1-10      SNF: 100% ?Outpatient:      Co-Pay: $20/visit ?Home Health: 100%      Co-Pay:  ?DME: 80%     Co-Ins: 20% ?Providers:  ?SECONDARY:       Policy#:      Phone#:  ? ?Financial Counselor:       Phone#:  ? ?The ?Data Collection Information Summary? for patients in Inpatient Rehabilitation Facilities with attached ?Privacy Act [place] Records? was provided and verbally reviewed with: Family ? ?Emergency Contact Information ?Contact Information   ? ? Name Relation Home Work Mobile  ? Marianna Fuss Daughter [phone]    ? Summers,Alberta Daughter   [phone]  ? Rachel Moulds Daughter [phone]    ? Devoria Albe Daughter [phone]    ? ?  ? ? ?Current Medical History  ?Patient Admitting Diagnosis: CVA  ? ?History of Present Illness: pt is a 88 y/o female with PMH of HTN, HLD, afib (not on AC), GI bleed, dementia, CVA, and DM who presented to [facility] on 12/24/21 with c/o aphasia, L gaze, and slurred speech.  Workup revealed left internal capsule infarct on MRI, likely due either to small vessel disease or afib.  Carotid dopplers completed without any significant stenosis, echo with preserved EF.  LDL 110, A1C 6.5.  Recommendations to continue aspirin given history of GI bleed.  Pt also noted with aspiration PNA and has  completed course of abx.  AKI has resolved.  Therapy ongoing and pt was recommended for CIR.  ? ?Complete NIHSS TOTAL: 18 ? ?Patient's [facility] record from [facility] has been reviewed by the rehabilitation admission coordinator and physician. ? ?Past Medical History  ?Past Medical History:  ?Diagnosis Date  ? A-fib ([place])   ? CVA (cerebral vascular accident) [facility])   ? Dementia ([place])   ? Diabetes mellitus without complication ([place])   ? GI bleed   ? Hyperlipemia   ? Hypertension   ? Thyroid disease   ? ? ?Has the patient had major surgery during 100 days prior to admission? No ? ?Family History   ?family history includes CVA in her mother; Diabetes in her brother, mother, and sister; Hypertension in her mother. ? ?Current Medications ? ?Current Facility-Administered Medications:  ?  acetaminophen (TYLENOL) tablet 650 mg, 650 mg, Oral, Q4H PRN **OR** acetaminophen (TYLENOL) 160 MG/5ML solution 650 mg, 650 mg, Per Tube, Q4H PRN **OR** acetaminophen (TYLENOL) suppository 650 mg, 650 mg, Rectal, Q4H PRN, Samtani, Jai-Gurmukh, MD ?  amLODipine (NORVASC) tablet 10 mg, 10 mg, Oral, Daily, Elgergawy,  Silver Huguenin, MD, 10 mg at 01/03/22 1062 ?  aspirin EC tablet 81 mg, 81 mg, Oral, Daily, Reome, Earle J, RPH, 81 mg at 01/02/22 2123 ?  feeding supplement (ENSURE ENLIVE / ENSURE PLUS) liquid 237 mL, 237 mL, Oral, BID BM, Samtani, Jai-Gurmukh, MD, 237 mL at 01/03/22 0916 ?  heparin injection 5,000 Units, 5,000 Units, Subcutaneous, Q8H, Nita Sells, MD, 5,000 Units at 01/03/22 0521 ?  insulin aspart (novoLOG) injection 0-9 Units, 0-9 Units, Subcutaneous, TID WC, Ghimire, Henreitta Leber, MD ?  lactated ringers infusion, , Intravenous, Continuous, Ghimire, Henreitta Leber, MD, Last Rate: 10 mL/hr at 01/03/22 0500, Infusion Verify at 01/03/22 0500 ?  latanoprost (XALATAN) 0.005 % ophthalmic solution 1 drop, 1 drop, Both Eyes, QHS, Elgergawy, Silver Huguenin, MD, 1 drop at 01/02/22 2123 ?  levothyroxine (SYNTHROID) tablet 112 mcg, 112 mcg, Oral,  Q0600, Elgergawy, Silver Huguenin, MD, 112 mcg at 01/03/22 0521 ?  lisinopril (ZESTRIL) tablet 20 mg, 20 mg, Oral, Daily, Elgergawy, Silver Huguenin, MD, 20 mg at 01/03/22 0911 ?  loperamide (IMODIUM) capsule 2 mg, 2 mg, Oral, PRN, Elgergawy, Silver Huguenin, MD, 2 mg at 01/03/22 0630 ?  metoprolol tartrate (LOPRESSOR) tablet 50 mg, 50 mg, Oral, BID, Elgergawy, Silver Huguenin, MD, 50 mg at 01/03/22 0911 ?  multivitamin with minerals tablet 1 tablet, 1 tablet, Oral, Daily, Elgergawy, Silver Huguenin, MD, 1 tablet at 01/03/22 0912 ?  rosuvastatin (CRESTOR) tablet 10 mg, 10 mg, Oral, Daily, Rosalin Hawking, MD, 10 mg at 01/03/22 0916 ?  senna-docusate (Senokot-S) tablet 1 tablet, 1 tablet, Oral, QHS PRN, Verlon Au, Jai-Gurmukh, MD ?  sodium chloride flush (NS) 0.9 % injection 3 mL, 3 mL, Intravenous, Once, Sherwood Gambler, MD ?  vitamin B-12 (CYANOCOBALAMIN) tablet 100 mcg, 100 mcg, Oral, Daily, Elgergawy, Silver Huguenin, MD, 100 mcg at 12/31/21 6948 ? ?Patients Current Diet:  ?Diet Order   ? ?       ?  Diet - low sodium heart healthy       ?  ?  Diet Carb Modified       ?  ?  DIET - DYS 1 Room service appropriate? Yes with Assist; Fluid consistency: Thin  Diet effective now       ?  ? ?  ?  ? ?  ? ? ?Precautions / Restrictions ?Precautions ?Precautions: Fall ?Restrictions ?Weight Bearing Restrictions: No  ? ?Has the patient had 2 or more falls or a fall with injury in the past year? Yes ? ?Prior Activity Level ?Household: amb with rollator and supervision/min assist from family, assist for ADLs intermittently at baseline, not driving, 54/6 supervision at baseline ? ?Prior Functional Level ?Self Care: Did the patient need help bathing, dressing, using the toilet or eating? Needed some help ? ?Indoor Mobility: Did the patient need assistance with walking from room to room (with or without device)? Needed some help ? ?Stairs: Did the patient need assistance with internal or external stairs (with or without device)? Needed some help ? ?Functional Cognition: Did the  patient need help planning regular tasks such as shopping or remembering to take medications? Needed some help ? ?Patient Information ?Are you of Hispanic, Latino/a,or Spanish origin?: A. No, not of Hispanic, Latino/a, or Spanish origin (proxy) ?What is your race?: B. Black or African American (proxy) ?Do you need or want an interpreter to communicate with a doctor or health care staff?: 0. No ? ?Patient's Response To:  ?Health Literacy and Transportation ?Is the patient able to respond to health  literacy and transportation needs?: No ?Health Literacy - How often do you need to have someone help you when you read instructions, pamphlets, or other written material from your doctor or pharmacy?: Patient unable to respond ?In the past 12 months, has lack of transportation kept you from medical appointments or from getting medications?: No (proxy) ?In the past 12 months, has lack of transportation kept you from meetings, work, or from getting things needed for daily living?: No (proxy) ? ?Home Assistive Devices / Equipment ?Home Assistive Devices/Equipment: Eyeglasses, Hearing aid, Gilford Rile (specify type) ?Home Equipment: Rollator (4 wheels), Shower seat, Transport chair, BSC/3in1, Other (comment) (bedrails) ? ?Prior Device Use: Indicate devices/aids used by the patient prior to current illness, exacerbation or injury? Walker ? ?Current Functional Level ?Cognition ? Arousal/Alertness: Awake/alert ?Overall Cognitive Status: Impaired/Different from baseline ?Difficult to assess due to: Impaired communication, Hard of hearing/deaf ?Current Attention Level: Focused ?Orientation Level: Oriented to person ?Following Commands:  (no true command following noted) ?Safety/Judgement: Decreased awareness of safety, Decreased awareness of deficits ?General Comments: non verbal ?Problem Solving: Impaired ?Safety/Judgment: Impaired ?   ?Extremity Assessment ?(includes Sensation/Coordination) ? Upper Extremity Assessment: Generalized  weakness  ?Lower Extremity Assessment: Generalized weakness  ?  ?ADLs ? Overall ADL's : Needs assistance/impaired ?Eating/Feeding: Maximal assistance, Sitting ?Eating/Feeding Details (indicate cue type and re

## 2022-01-03 NOTE — Plan of Care (Signed)
?  Problem: Education: ?Goal: Knowledge of General Education information will improve ?Description: Including pain rating scale, medication(s)/side effects and non-pharmacologic comfort measures ?Outcome: Progressing ?  ?Problem: Health Behavior/Discharge Planning: ?Goal: Ability to manage health-related needs will improve ?Outcome: Progressing ?  ?Problem: Clinical Measurements: ?Goal: Ability to maintain clinical measurements within normal limits will improve ?Outcome: Progressing ?Goal: Will remain free from infection ?Outcome: Progressing ?Goal: Diagnostic test results will improve ?Outcome: Progressing ?  ?Problem: Activity: ?Goal: Risk for activity intolerance will decrease ?Outcome: Progressing ?  ?Problem: Nutrition: ?Goal: Adequate nutrition will be maintained ?Outcome: Progressing ?  ?Problem: Elimination: ?Goal: Will not experience complications related to bowel motility ?Outcome: Progressing ?Goal: Will not experience complications related to urinary retention ?Outcome: Progressing ?  ?Problem: Safety: ?Goal: Ability to remain free from injury will improve ?Outcome: Progressing ?  ?Problem: Skin Integrity: ?Goal: Risk for impaired skin integrity will decrease ?Outcome: Progressing ?  ?Problem: Education: ?Goal: Knowledge of General Education information will improve ?Description: Including pain rating scale, medication(s)/side effects and non-pharmacologic comfort measures ?Outcome: Progressing ?  ?Problem: Health Behavior/Discharge Planning: ?Goal: Ability to manage health-related needs will improve ?Outcome: Progressing ?  ?Problem: Clinical Measurements: ?Goal: Ability to maintain clinical measurements within normal limits will improve ?Outcome: Progressing ?Goal: Will remain free from infection ?Outcome: Progressing ?Goal: Diagnostic test results will improve ?Outcome: Progressing ?Goal: Respiratory complications will improve ?Outcome: Progressing ?Goal: Cardiovascular complication will be  avoided ?Outcome: Progressing ?  ?Problem: Activity: ?Goal: Risk for activity intolerance will decrease ?Outcome: Progressing ?  ?Problem: Nutrition: ?Goal: Adequate nutrition will be maintained ?Outcome: Progressing ?  ?Problem: Coping: ?Goal: Level of anxiety will decrease ?Outcome: Progressing ?  ?Problem: Elimination: ?Goal: Will not experience complications related to bowel motility ?Outcome: Progressing ?Goal: Will not experience complications related to urinary retention ?Outcome: Progressing ?  ?Problem: Pain Managment: ?Goal: General experience of comfort will improve ?Outcome: Progressing ?  ?Problem: Safety: ?Goal: Ability to remain free from injury will improve ?Outcome: Progressing ?  ?Problem: Skin Integrity: ?Goal: Risk for impaired skin integrity will decrease ?Outcome: Progressing ?  ?Problem: Education: ?Goal: Knowledge of disease or condition will improve ?Outcome: Progressing ?Goal: Knowledge of secondary prevention will improve (SELECT ALL) ?Outcome: Progressing ?Goal: Knowledge of patient specific risk factors will improve (INDIVIDUALIZE FOR PATIENT) ?Outcome: Progressing ?Goal: Individualized Educational Video(s) ?Outcome: Progressing ?  ?

## 2022-01-03 NOTE — Plan of Care (Signed)
?Problem: Education: ?Goal: Knowledge of General Education information will improve ?Description: Including pain rating scale, medication(s)/side effects and non-pharmacologic comfort measures ?01/03/2022 1014 by Vonna Kotyk, RN ?Outcome: Adequate for Discharge ?01/03/2022 0743 by Vonna Kotyk, RN ?Outcome: Progressing ?  ?Problem: Health Behavior/Discharge Planning: ?Goal: Ability to manage health-related needs will improve ?01/03/2022 1014 by Vonna Kotyk, RN ?Outcome: Adequate for Discharge ?01/03/2022 0743 by Vonna Kotyk, RN ?Outcome: Progressing ?  ?Problem: Clinical Measurements: ?Goal: Ability to maintain clinical measurements within normal limits will improve ?01/03/2022 1014 by Vonna Kotyk, RN ?Outcome: Adequate for Discharge ?01/03/2022 0743 by Vonna Kotyk, RN ?Outcome: Progressing ?Goal: Will remain free from infection ?01/03/2022 1014 by Vonna Kotyk, RN ?Outcome: Adequate for Discharge ?01/03/2022 0743 by Vonna Kotyk, RN ?Outcome: Progressing ?Goal: Diagnostic test results will improve ?01/03/2022 1014 by Vonna Kotyk, RN ?Outcome: Adequate for Discharge ?01/03/2022 0743 by Vonna Kotyk, RN ?Outcome: Progressing ?  ?Problem: Activity: ?Goal: Risk for activity intolerance will decrease ?01/03/2022 1014 by Vonna Kotyk, RN ?Outcome: Adequate for Discharge ?01/03/2022 0743 by Vonna Kotyk, RN ?Outcome: Progressing ?  ?Problem: Nutrition: ?Goal: Adequate nutrition will be maintained ?01/03/2022 1014 by Vonna Kotyk, RN ?Outcome: Adequate for Discharge ?01/03/2022 0743 by Vonna Kotyk, RN ?Outcome: Progressing ?  ?Problem: Elimination: ?Goal: Will not experience complications related to bowel motility ?01/03/2022 1014 by Vonna Kotyk, RN ?Outcome: Adequate for Discharge ?01/03/2022 0743 by Vonna Kotyk, RN ?Outcome: Progressing ?Goal: Will not experience complications related to urinary retention ?01/03/2022 1014 by Vonna Kotyk, RN ?Outcome: Adequate for Discharge ?01/03/2022 0743 by Vonna Kotyk,  RN ?Outcome: Progressing ?  ?Problem: Safety: ?Goal: Ability to remain free from injury will improve ?01/03/2022 1014 by Vonna Kotyk, RN ?Outcome: Adequate for Discharge ?01/03/2022 0743 by Vonna Kotyk, RN ?Outcome: Progressing ?  ?Problem: Skin Integrity: ?Goal: Risk for impaired skin integrity will decrease ?01/03/2022 1014 by Vonna Kotyk, RN ?Outcome: Adequate for Discharge ?01/03/2022 0743 by Vonna Kotyk, RN ?Outcome: Progressing ?  ?Problem: Education: ?Goal: Knowledge of General Education information will improve ?Description: Including pain rating scale, medication(s)/side effects and non-pharmacologic comfort measures ?01/03/2022 1014 by Vonna Kotyk, RN ?Outcome: Adequate for Discharge ?01/03/2022 0743 by Vonna Kotyk, RN ?Outcome: Progressing ?  ?Problem: Health Behavior/Discharge Planning: ?Goal: Ability to manage health-related needs will improve ?01/03/2022 1014 by Vonna Kotyk, RN ?Outcome: Adequate for Discharge ?01/03/2022 0743 by Vonna Kotyk, RN ?Outcome: Progressing ?  ?Problem: Clinical Measurements: ?Goal: Ability to maintain clinical measurements within normal limits will improve ?01/03/2022 1014 by Vonna Kotyk, RN ?Outcome: Adequate for Discharge ?01/03/2022 0743 by Vonna Kotyk, RN ?Outcome: Progressing ?Goal: Will remain free from infection ?01/03/2022 1014 by Vonna Kotyk, RN ?Outcome: Adequate for Discharge ?01/03/2022 0743 by Vonna Kotyk, RN ?Outcome: Progressing ?Goal: Diagnostic test results will improve ?01/03/2022 1014 by Vonna Kotyk, RN ?Outcome: Adequate for Discharge ?01/03/2022 0743 by Vonna Kotyk, RN ?Outcome: Progressing ?Goal: Respiratory complications will improve ?01/03/2022 1014 by Vonna Kotyk, RN ?Outcome: Adequate for Discharge ?01/03/2022 0743 by Vonna Kotyk, RN ?Outcome: Progressing ?Goal: Cardiovascular complication will be avoided ?01/03/2022 1014 by Vonna Kotyk, RN ?Outcome: Adequate for Discharge ?01/03/2022 0743 by Vonna Kotyk, RN ?Outcome: Progressing ?   ?Problem: Activity: ?Goal: Risk for activity intolerance will decrease ?01/03/2022 1014 by Vonna Kotyk, RN ?Outcome: Adequate for Discharge ?01/03/2022 0743 by Vonna Kotyk, RN ?Outcome:  Progressing ?  ?Problem: Nutrition: ?Goal: Adequate nutrition will be maintained ?01/03/2022 1014 by Vonna Kotyk, RN ?Outcome: Adequate for Discharge ?01/03/2022 0743 by Vonna Kotyk, RN ?Outcome: Progressing ?  ?Problem: Coping: ?Goal: Level of anxiety will decrease ?01/03/2022 1014 by Vonna Kotyk, RN ?Outcome: Adequate for Discharge ?01/03/2022 0743 by Vonna Kotyk, RN ?Outcome: Progressing ?  ?Problem: Elimination: ?Goal: Will not experience complications related to bowel motility ?01/03/2022 1014 by Vonna Kotyk, RN ?Outcome: Adequate for Discharge ?01/03/2022 0743 by Vonna Kotyk, RN ?Outcome: Progressing ?Goal: Will not experience complications related to urinary retention ?01/03/2022 1014 by Vonna Kotyk, RN ?Outcome: Adequate for Discharge ?01/03/2022 0743 by Vonna Kotyk, RN ?Outcome: Progressing ?  ?Problem: Pain Managment: ?Goal: General experience of comfort will improve ?01/03/2022 1014 by Vonna Kotyk, RN ?Outcome: Adequate for Discharge ?01/03/2022 0743 by Vonna Kotyk, RN ?Outcome: Progressing ?  ?Problem: Safety: ?Goal: Ability to remain free from injury will improve ?01/03/2022 1014 by Vonna Kotyk, RN ?Outcome: Adequate for Discharge ?01/03/2022 0743 by Vonna Kotyk, RN ?Outcome: Progressing ?  ?Problem: Skin Integrity: ?Goal: Risk for impaired skin integrity will decrease ?01/03/2022 1014 by Vonna Kotyk, RN ?Outcome: Adequate for Discharge ?01/03/2022 0743 by Vonna Kotyk, RN ?Outcome: Progressing ?  ?Problem: Education: ?Goal: Knowledge of disease or condition will improve ?01/03/2022 1014 by Vonna Kotyk, RN ?Outcome: Adequate for Discharge ?01/03/2022 0743 by Vonna Kotyk, RN ?Outcome: Progressing ?Goal: Knowledge of secondary prevention will improve (SELECT ALL) ?01/03/2022 1014 by Vonna Kotyk,  RN ?Outcome: Adequate for Discharge ?01/03/2022 0743 by Vonna Kotyk, RN ?Outcome: Progressing ?Goal: Knowledge of patient specific risk factors will improve (INDIVIDUALIZE FOR PATIENT) ?01/03/2022 1014 by Vonna Kotyk, RN ?Outcome: Adequate for Discharge ?01/03/2022 0743 by Vonna Kotyk, RN ?Outcome: Progressing ?Goal: Individualized Educational Video(s) ?01/03/2022 1014 by Vonna Kotyk, RN ?Outcome: Adequate for Discharge ?01/03/2022 0743 by Vonna Kotyk, RN ?Outcome: Progressing ?  ?

## 2022-01-03 NOTE — Progress Notes (Signed)
Discharge paperwork placed in patients personal belongings bag to transfer to CIR on the fourth floor. Patients daughter was updated by this RN by phone call concerning patient discharge to CIR. ? ?

## 2022-01-03 NOTE — H&P (Signed)
?  ?Physical Medicine and Rehabilitation Admission H&P ?  ?  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  ?HPI: Patricia Dudley is an 88 year old female who presented to the emergency department on 12/24/2021 complaining of aphasia, left gaze and slurred speech.  Family reported vomiting prior to arrival.  She was outside the window for tenecteplase therapy.  Code stroke called by EMS.  Initial head CT scan revealed extensive chronic ischemic changes throughout the brain.  Aspirin 81 mg initiated.  MRI of the brain showed small left basal ganglia/internal capsule infarct.  MRA were unremarkable.  Chest x-ray significant for mild airspace disease at the left base, favor atelectasis.  She was treated for presumptive aspiration pneumonia with Rocephin and azithromycin intravenously transition to oral Augmentin. ?SLP evaluation on 5/2 with evidence of mild aspiration risk.  Dysphagia 1 with thin liquids recommended.  Diet advanced to dysphagia 3/pur?ed meats with thin liquids on 5/4, but downgraded back to dysphagia 1 diet, crushed meds on 5/5. The patient requires inpatient medicine and rehabilitation evaluations and services for ongoing dysfunction secondary to left basal ganglia/internal capsule infarct. ?  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  Medical history is also significant for chronic kidney disease stage IIIb, hypothyroidism, diabetes mellitus, hypertension, glaucoma, hyperlipidemia ?  ?Echocardiogram 12/25/2021 EF estimated 60 to 65% ?Hemoglobin A1c 6.5% 12/25/2021 ?LDL 110 12/25/2021, other lipid values within normal limits ?  ?Daughters at bedside. State patient becomes somewhat agitated with stimulation. Incontinent of bowel and bladder. ?  ?  ?  ?Review of Systems  ?Unable to perform ROS: Patient nonverbal (cognitive impairment/aphasia)  ?     ?Past Medical History:  ?Diagnosis Date  ? A-fib ([place])    ? CVA (cerebral vascular accident) [facility])    ? Dementia ([place])    ? Diabetes mellitus without complication ([place])    ? GI bleed    ? Hyperlipemia    ? Hypertension    ? Thyroid disease    ?  ?     ?Past Surgical History:  ?Procedure Laterality Date  ? COLONOSCOPY N/A 05/16/2017  ?  Procedure: COLONOSCOPY;  Surgeon: Lin Landsman, MD;  Location: [facility] ENDOSCOPY;  Service: Gastroenterology;  Laterality: N/A;  ? ESOPHAGOGASTRODUODENOSCOPY N/A 08/08/2016  ?  Procedure: ESOPHAGOGASTRODUODENOSCOPY (EGD);  Surgeon: [name], MD;  Location: [facility] ENDOSCOPY;  Service: Endoscopy;  Laterality: N/A;  ? ESOPHAGOGASTRODUODENOSCOPY N/A 05/16/2017  ?  Procedure: ESOPHAGOGASTRODUODENOSCOPY (EGD);  Surgeon: Lin Landsman, MD;  Location: [facility] ENDOSCOPY;  Service: Gastroenterology;  Laterality: N/A;  ? ESOPHAGOGASTRODUODENOSCOPY (EGD) WITH PROPOFOL N/A 02/28/2017  ?  Procedure: ESOPHAGOGASTRODUODENOSCOPY (EGD) WITH PROPOFOL;  Surgeon: [name], MD;  Location: [facility] ENDOSCOPY;  Service: Endoscopy;  Laterality: N/A;  ? EYE SURGERY      ?  left  ?  ?     ?Family History  ?Problem Relation Age of Onset  ? Diabetes Mother    ? CVA Mother    ? Hypertension Mother    ? Diabetes Sister    ? Diabetes Brother    ?  ?Social History:  reports that she has never smoked. She has never used smokeless tobacco. She reports that she does not drink alcohol and does not use drugs. ?Allergies:  ?     ?Allergies  ?Allergen Reactions  ? Ezetimibe Other (See Comments)  ?    Unspecified ?Other reaction(s):  Other (see comments) ?Other reaction(s): Other (See Comments) ?Unspecified ?Unspecified  ? Iodine Rash  ?  ?      ?Medications Prior to Admission  ?Medication Sig Dispense Refill  ? alendronate (FOSAMAX) 70 MG tablet Take 70 mg by mouth once a week. Take with a full glass of water on an empty stomach.      ? cholecalciferol (VITAMIN D3) 25 MCG (1000 UNIT) tablet Take 1,000 Units by  mouth daily.      ? glipiZIDE (GLUCOTROL XL) 10 MG 24 hr tablet Take 1 tablet by mouth daily.      ? latanoprost (XALATAN) 0.005 % ophthalmic solution Place 1 drop into both eyes at bedtime.      ? levothyroxine (SYNTHROID, LEVOTHROID) 112 MCG tablet Take 112 mcg by mouth as directed. Take 1 tablet Mon-Fri      ? lisinopril (PRINIVIL,ZESTRIL) 40 MG tablet Take 1 tablet (40 mg total) by mouth daily. 30 tablet 0  ? torsemide (DEMADEX) 20 MG tablet Take 20 mg by mouth 2 (two) times daily as needed (swelling).      ? vitamin B-12 (CYANOCOBALAMIN) 100 MCG tablet Take 100 mcg by mouth daily.      ? [DISCONTINUED] amLODipine (NORVASC) 5 MG tablet Take 1 tablet (5 mg total) by mouth daily. 30 tablet 0  ? atorvastatin (LIPITOR) 10 MG tablet Take 1 tablet (10 mg total) by mouth daily. (Patient not taking: Reported on 12/24/2021) 30 tablet 0  ? fluticasone (FLONASE) 50 MCG/ACT nasal spray Place into the nose.      ? glucose blood test strip USE TO TEST BLOOD SUGAR TWICE DAILY      ? iron polysaccharides (NIFEREX) 150 MG capsule Take 1 capsule (150 mg total) by mouth 2 (two) times daily. (Patient not taking: Reported on 12/24/2021) 60 capsule 0  ? ondansetron (ZOFRAN) 4 MG tablet Take 1 tablet (4 mg total) by mouth every 8 (eight) hours as needed. (Patient not taking: Reported on 12/24/2021) 20 tablet 0  ? ONETOUCH DELICA LANCETS 51V MISC USE TO TEST BLOOD SUGAR TWICE DAILY      ? pantoprazole (PROTONIX) 40 MG tablet Take 1 tablet (40 mg total) by mouth 2 (two) times daily before a meal. (Patient not taking: Reported on 06/13/2017) 60 tablet 0  ?  ?  ?  ?  ?Home: ?Home Living ?Family/patient expects to be discharged to:: Private residence ?Living Arrangements: Children ?Available Help at Discharge: Family ?Type of Home: House ?Home Access: Level entry ?Home Layout: One level ?Bathroom Shower/Tub: Walk-in shower ?Bathroom Toilet: Standard ?Home Equipment: Rollator (4 wheels), Shower seat, Transport chair, BSC/3in1, Other (comment)  (bedrails) ?Adaptive Equipment: Other (Comment) (bedrails) ?Additional Comments: daughter in attendance to give history pt is not able to deliver ? Lives With: Family ?  ?Functional History: ?Prior Function ?Prior Level of Function : Needs assist, Other (comment) (daughter reports pt walked alone on rollator) ?Physical Assist : Mobility (physical) ?Mobility (physical): Gait, Transfers ?Mobility Comments: HHPT was seeing for Rollator walking, family helps with personal care ?ADLs Comments: pt reports daughter assist her with dressing ?  ?Functional Status:  ?Mobility: ?Bed Mobility ?Overal bed mobility: Needs Assistance ?Bed Mobility: Supine to Sit, Sit to Supine, Rolling ?Rolling: Max assist ?Sidelying to sit: Max assist ?Supine to sit: Max assist ?Sit to supine: Total assist ?Sit to sidelying: Total assist ?General bed mobility comments: assist for LEs over EOB, pt assisted with push up to raise trunk, assist of bed pad for hips to EOB; to  sidelying pt requird guidance for trunk and total assist to raise legs ?Transfers ?Overall transfer level: Needs assistance ?Equipment used: None ?Transfers: Sit to/from Stand ?Sit to Stand: Mod assist ?Bed to/from chair/wheelchair/BSC transfer type:: Step pivot ?Stand pivot transfers: Max assist ?Step pivot transfers: Mod assist ?General transfer comment: stood from EOB x 3 reps with each time improving upright posture and time standing (max 90 seconds). Pt incontinent of stool and NT in to assist with cleaning. Pt continuing to go and returned to bed for more complete pericare. ?Ambulation/Gait ?General Gait Details: pt with incontinence of bowels; session focused on standing and transfer back to bed for thorough cleaning ?  ?ADL: ?ADL ?Overall ADL's : Needs assistance/impaired ?Eating/Feeding: Maximal assistance, Sitting ?Eating/Feeding Details (indicate cue type and reason): worked on self feeding and drinking at EOB ?Grooming: Wash/dry hands, Sitting, Total assistance ?Lower  Body Bathing: Total assistance, Bed level ?Lower Body Bathing Details (indicate cue type and reason): simulated applying lotion to BLEs with mod assist to complete upper legs, hand over hand to sequence t

## 2022-01-03 NOTE — Progress Notes (Signed)
Inpatient Rehabilitation Admission Medication Review by a Pharmacist ? ?A complete drug regimen review was completed for this patient to identify any potential clinically significant medication issues. ? ?High Risk Drug Classes Is patient taking? Indication by Medication  ?Antipsychotic Yes Compazine prn N/V  ?Anticoagulant Yes Sq heparin for VTE ppx  ?Antibiotic No   ?Opioid No   ?Antiplatelet No   ?Hypoglycemics/insulin Yes SSI for DM  ?Vasoactive Medication Yes Amlodipine, lisinopril, metoprolol for BP  ?Chemotherapy No   ?Other Yes Synthroid for low thyroid ?Crestor for HLD ?Xalatan for glaucoma  ? ? ? ?Type of Medication Issue Identified Description of Issue Recommendation(s)  ?Drug Interaction(s) (clinically significant) ?    ?Duplicate Therapy ?    ?Allergy ?    ?No Medication Administration End Date ?    ?Incorrect Dose ?    ?Additional Drug Therapy Needed ?    ?Significant med changes from prior encounter (inform family/care partners about these prior to discharge). Glipizide / torsemide to resume if and when appropriate at discharge   ?Other ?    ? ? ?Clinically significant medication issues were identified that warrant physician communication and completion of prescribed/recommended actions by midnight of the next day:  No ? ?Pharmacist comments: None ? ?Time spent performing this drug regimen review (minutes):  20 minutes ? ? ?Tad Moore ?01/03/2022 2:32 PM ?

## 2022-01-03 NOTE — Progress Notes (Signed)
Patient handoff report given to CIR at this time. No further requests have been made. ?

## 2022-01-03 NOTE — Care Management Important Message (Signed)
Important Message ? ?Patient Details  ?Name: Patricia Dudley ?MRN: [id number] ?Date of Birth: 1930-01-31 ? ? ?Medicare Important Message Given:  Yes ? ? ? ? ?[name] ?01/03/2022, 3:49 PM ?

## 2022-01-03 NOTE — Progress Notes (Signed)
PMR Admission Coordinator Pre-Admission Assessment ?  ?Patient: Patricia Dudley is an 88 y.o., female ?MRN: [id number] ?DOB: July 11, 1930 ?Height: $RemoveBefor'5\' 5"'HnoYrkFYHoBx$  (165.1 cm) ?Weight: 56.8 kg ?  ?Insurance Information ?HMO:     PPO: yes     PCP:      IPA:      80/20:      OTHER:  ?PRIMARY: [employer]      Policy#: [id number]      Subscriber: pt ?CM Name: Sunna      Phone#: faxed approval     Fax#: [phone] ?Pre-Cert#: [id number] auth for CIR via faxed approval with updates due to fax listed above on 5/17      Employer:  ?Benefits:  Phone #: [phone]     Name:  ?Eff. Date: 08/27/19     Deduct: $0      Out of Pocket Max: $4500 (met $560)      Life Max: n/a ?CIR: $160/day for days 1-10      SNF: 100% ?Outpatient:      Co-Pay: $20/visit ?Home Health: 100%      Co-Pay:  ?DME: 80%     Co-Ins: 20% ?Providers:  ?SECONDARY:       Policy#:      Phone#:  ?  ?Financial Counselor:       Phone#:  ?  ?The ?Data Collection Information Summary? for patients in Inpatient Rehabilitation Facilities with attached ?Privacy Act [place] Records? was provided and verbally reviewed with: Family ?  ?Emergency Contact Information ?Contact Information   ?  ?  Name Relation Home Work Mobile  ?  Marianna Fuss Daughter [phone]      ?  Summers,Alberta Daughter     [phone]  ?  Rachel Moulds Daughter [phone]      ?  Devoria Albe Daughter [phone]      ?  ?   ?  ?  ?Current Medical History  ?Patient Admitting Diagnosis: CVA  ?  ?History of Present Illness: pt is a 88 y/o female with PMH of HTN, HLD, afib (not on AC), GI bleed, dementia, CVA, and DM who presented to [facility] on 12/24/21 with c/o aphasia, L gaze, and slurred speech.  Workup revealed left internal capsule infarct on MRI, likely due either to small vessel disease or afib.  Carotid dopplers completed without any significant stenosis, echo with preserved EF.  LDL 110, A1C 6.5.  Recommendations to continue aspirin given history of GI bleed.  Pt also noted with  aspiration PNA and has completed course of abx.  AKI has resolved.  Therapy ongoing and pt was recommended for CIR.  ?  ?Complete NIHSS TOTAL: 18 ?  ?Patient's medical record from [facility] has been reviewed by the rehabilitation admission coordinator and physician. ?  ?Past Medical History  ?    ?Past Medical History:  ?Diagnosis Date  ? A-fib ([place])    ? CVA (cerebral vascular accident) [facility])    ? Dementia ([place])    ? Diabetes mellitus without complication ([place])    ? GI bleed    ? Hyperlipemia    ? Hypertension    ? Thyroid disease    ?  ?  ?Has the patient had major surgery during 100 days prior to admission? No ?  ?Family History   ?family history includes CVA in her mother; Diabetes in her brother, mother, and sister; Hypertension in her mother. ?  ?Current Medications ?  ?Current Facility-Administered Medications:  ?  acetaminophen (TYLENOL) tablet 650 mg, 650  mg, Oral, Q4H PRN **OR** acetaminophen (TYLENOL) 160 MG/5ML solution 650 mg, 650 mg, Per Tube, Q4H PRN **OR** acetaminophen (TYLENOL) suppository 650 mg, 650 mg, Rectal, Q4H PRN, Samtani, Jai-Gurmukh, MD ?  amLODipine (NORVASC) tablet 10 mg, 10 mg, Oral, Daily, Elgergawy, Silver Huguenin, MD, 10 mg at 01/03/22 0911 ?  aspirin EC tablet 81 mg, 81 mg, Oral, Daily, Reome, Earle J, RPH, 81 mg at 01/02/22 2123 ?  feeding supplement (ENSURE ENLIVE / ENSURE PLUS) liquid 237 mL, 237 mL, Oral, BID BM, Samtani, Jai-Gurmukh, MD, 237 mL at 01/03/22 0916 ?  heparin injection 5,000 Units, 5,000 Units, Subcutaneous, Q8H, Nita Sells, MD, 5,000 Units at 01/03/22 0521 ?  insulin aspart (novoLOG) injection 0-9 Units, 0-9 Units, Subcutaneous, TID WC, Ghimire, Henreitta Leber, MD ?  lactated ringers infusion, , Intravenous, Continuous, Ghimire, Henreitta Leber, MD, Last Rate: 10 mL/hr at 01/03/22 0500, Infusion Verify at 01/03/22 0500 ?  latanoprost (XALATAN) 0.005 % ophthalmic solution 1 drop, 1 drop, Both Eyes, QHS, Elgergawy, Silver Huguenin, MD, 1 drop at 01/02/22 2123 ?  levothyroxine  (SYNTHROID) tablet 112 mcg, 112 mcg, Oral, Q0600, Elgergawy, Silver Huguenin, MD, 112 mcg at 01/03/22 0521 ?  lisinopril (ZESTRIL) tablet 20 mg, 20 mg, Oral, Daily, Elgergawy, Silver Huguenin, MD, 20 mg at 01/03/22 0911 ?  loperamide (IMODIUM) capsule 2 mg, 2 mg, Oral, PRN, Elgergawy, Silver Huguenin, MD, 2 mg at 01/03/22 0630 ?  metoprolol tartrate (LOPRESSOR) tablet 50 mg, 50 mg, Oral, BID, Elgergawy, Silver Huguenin, MD, 50 mg at 01/03/22 0911 ?  multivitamin with minerals tablet 1 tablet, 1 tablet, Oral, Daily, Elgergawy, Silver Huguenin, MD, 1 tablet at 01/03/22 0912 ?  rosuvastatin (CRESTOR) tablet 10 mg, 10 mg, Oral, Daily, Rosalin Hawking, MD, 10 mg at 01/03/22 0916 ?  senna-docusate (Senokot-S) tablet 1 tablet, 1 tablet, Oral, QHS PRN, Verlon Au, Jai-Gurmukh, MD ?  sodium chloride flush (NS) 0.9 % injection 3 mL, 3 mL, Intravenous, Once, Sherwood Gambler, MD ?  vitamin B-12 (CYANOCOBALAMIN) tablet 100 mcg, 100 mcg, Oral, Daily, Elgergawy, Silver Huguenin, MD, 100 mcg at 12/31/21 5397 ?  ?Patients Current Diet:  ?Diet Order   ?  ?         ?    Diet - low sodium heart healthy       ?  ?    Diet Carb Modified       ?  ?    DIET - DYS 1 Room service appropriate? Yes with Assist; Fluid consistency: Thin  Diet effective now       ?  ?  ?   ?  ?  ?   ?  ?  ?Precautions / Restrictions ?Precautions ?Precautions: Fall ?Restrictions ?Weight Bearing Restrictions: No  ?  ?Has the patient had 2 or more falls or a fall with injury in the past year? Yes ?  ?Prior Activity Level ?Household: amb with rollator and supervision/min assist from family, assist for ADLs intermittently at baseline, not driving, 67/3 supervision at baseline ?  ?Prior Functional Level ?Self Care: Did the patient need help bathing, dressing, using the toilet or eating? Needed some help ?  ?Indoor Mobility: Did the patient need assistance with walking from room to room (with or without device)? Needed some help ?  ?Stairs: Did the patient need assistance with internal or external stairs (with or  without device)? Needed some help ?  ?Functional Cognition: Did the patient need help planning regular tasks such as shopping or remembering to take medications? Needed some  help ?  ?Patient Information ?Are you of Hispanic, Latino/a,or Spanish origin?: A. No, not of Hispanic, Latino/a, or Spanish origin (proxy) ?What is your race?: B. Black or African American (proxy) ?Do you need or want an interpreter to communicate with a doctor or health care staff?: 0. No ?  ?Patient's Response To:  ?Health Literacy and Transportation ?Is the patient able to respond to health literacy and transportation needs?: No ?Health Literacy - How often do you need to have someone help you when you read instructions, pamphlets, or other written material from your doctor or pharmacy?: Patient unable to respond ?In the past 12 months, has lack of transportation kept you from medical appointments or from getting medications?: No (proxy) ?In the past 12 months, has lack of transportation kept you from meetings, work, or from getting things needed for daily living?: No (proxy) ?  ?Home Assistive Devices / Equipment ?Home Assistive Devices/Equipment: Eyeglasses, Hearing aid, Gilford Rile (specify type) ?Home Equipment: Rollator (4 wheels), Shower seat, Transport chair, BSC/3in1, Other (comment) (bedrails) ?  ?Prior Device Use: Indicate devices/aids used by the patient prior to current illness, exacerbation or injury? Walker ?  ?Current Functional Level ?Cognition ?  Arousal/Alertness: Awake/alert ?Overall Cognitive Status: Impaired/Different from baseline ?Difficult to assess due to: Impaired communication, Hard of hearing/deaf ?Current Attention Level: Focused ?Orientation Level: Oriented to person ?Following Commands:  (no true command following noted) ?Safety/Judgement: Decreased awareness of safety, Decreased awareness of deficits ?General Comments: non verbal ?Problem Solving: Impaired ?Safety/Judgment: Impaired ?   ?Extremity  Assessment ?(includes Sensation/Coordination) ?  Upper Extremity Assessment: Generalized weakness  ?Lower Extremity Assessment: Generalized weakness  ?   ?ADLs ?  Overall ADL's : Needs assistance/impaired ?Eating/Feeding: M

## 2022-01-03 NOTE — Discharge Summary (Addendum)
? ?PATIENT DETAILS ?Name: Patricia Dudley ?Age: 88 y.o. ?Sex: female ?Date of Birth: 01-03-1930 ?MRN: [id number]. ?Admitting Physician: Nita Sells, MD ?GYF:VCBSW, Cherlyn Labella, MD ? ?Admit Date: 12/24/2021 ?Discharge date: 01/03/2022 ? ?Recommendations for Outpatient Follow-up:  ?Follow up with PCP in 1-2 weeks ?Please obtain CMP/CBC in one week ?Please ensure follow-up with the stroke clinic. ? ?Admitted From:  ?Home ? ?Disposition: ?CIR ?  ?Discharge Condition: ?fair ? ?CODE STATUS: ?  Code Status: DNR  ? ?Diet recommendation:  ?Diet Order   ? ?       ?  Diet - low sodium heart healthy       ?  ?  Diet Carb Modified       ?  ?  DIET - DYS 1 Room service appropriate? Yes with Assist; Fluid consistency: Thin  Diet effective now       ?  ? ?  ?  ? ?  ?  ? 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   ? ?Brief Hospital Course: ?Acute CVA: Left internal capsule infarct on MRI-likely due to small vessel disease, but given history of A-fib-possibly cardioembolic-but felt to be unlikely.  Remains aphasic-but awake-awaiting CIR bed at this point.  MRI brain without any significant stenosis.  Carotid Doppler without any significant stenosis, echo with preserved EF, LDL 110, A1c 6.5.  Given prior history of severe GI bleeding-recommendations are to continue aspirin.  ?  ?Aspiration PNA: Has completed a course of antimicrobial therapy.  Evaluated by SLP-seems to be tolerating dysphagia 1 diet.  Well. ?  ?Acute metabolic encephalopathy: Due to combination of PNA/CVA-she is improved. ?  ?AKI: Hemodynamically mediated-improved with supportive care-creatinine back to baseline. ?  ?Hypernatremia:  Resolved with IVF. ?  ?PAF: Maintaining sinus rhythm-on metoprolol-not felt to be a appropriate anticoagulation candidate given history of prior severe GI bleeding requiring PRBC transfusion. ?  ?HTN: BP stable-continue metoprolol, lisinopril and metoprolol. ?  ?HLD: Continue statin ? ?DM-2: Continue SSI-oral hypoglycemic agents remain on hold. ?  ?Hypothyroidism: Continue Synthroid ?  ?History of GI bleeding secondary to AVMs: No obvious bleeding ?  ?Nutrition Status: ?Nutrition Problem: Moderate Malnutrition ?Etiology: chronic illness ?Signs/Symptoms: mild muscle depletion, mild fat depletion, percent weight loss ?Percent weight loss: 9 % ?Interventions: Ensure Enlive (each supplement provides 350kcal and 20 grams of protein), MVI ? ?BMI: ?Estimated body mass index is 20.83 kg/m? as calculated from the following: ?  Height as of this encounter: 5\' 5"  (1.651 m). ?  Weight as of this encounter: 56.8 kg.  ? ?Discharge Diagnoses:  ?Principal Problem: ?  PNA (pneumonia) ?Active Problems: ?  Cerebral thrombosis with cerebral infarction ?  Malnutrition of moderate degree ? ? ?Discharge Instructions: ? ?Activity:  ?As tolerated with Full fall precautions use walker/cane & assistance as needed ? ? ?Discharge Instructions   ? ? Ambulatory referral to Neurology   Complete by: As directed ?  ? Follow up with stroke clinic NP ([facility] [place] or Cecille Rubin, if both not available, consider Zachery Dauer, or [place]) at [facility] in about 4 weeks. Thanks.  ? Diet - low sodium heart healthy   Complete by: As directed ?  ? Diet Carb Modified   Complete by: As directed ?  ?  Increase activity slowly   Complete by: As directed ?  ? ?  ? ?Allergies as of 01/03/2022   ? ?   Reactions  ? Ezetimibe Other (See Comments)  ? Unspecified ?Other reaction(s): Other (see comments) ?Other reaction(s): Other (See Comments) ?Unspecified ?Unspecified  ? Iodine Rash  ? ?  ? ?  ?Medication List  ?  ? ?STOP taking these medications   ? ?atorvastatin  10 MG tablet ?Commonly known as: LIPITOR ?  ?glipiZIDE 10 MG 24 hr tablet ?Commonly known as: GLUCOTROL XL ?  ?ondansetron 4 MG tablet ?Commonly known as: Zofran ?  ?torsemide 20 MG tablet ?Commonly known as: DEMADEX ?  ? ?  ? ?TAKE these medications   ? ?alendronate 70 MG tablet ?Commonly known as: FOSAMAX ?Take 70 mg by mouth once a week. Take with a full glass of water on an empty stomach. ?  ?amLODipine 5 MG tablet ?Commonly known as: NORVASC ?Take 2 tablets (10 mg total) by mouth daily. ?What changed: how much to take ?  ?aspirin 81 MG EC tablet ?Take 1 tablet (81 mg total) by mouth daily. Swallow whole. ?  ?cholecalciferol 25 MCG (1000 UNIT) tablet ?Commonly known as: VITAMIN D3 ?Take 1,000 Units by mouth daily. ?  ?feeding supplement Liqd ?Take 237 mLs by mouth 2 (two) times daily between meals. ?  ?fluticasone 50 MCG/ACT nasal spray ?Commonly known as: FLONASE ?Place into the nose. ?  ?glucose blood test strip ?USE TO TEST BLOOD SUGAR TWICE DAILY ?  ?insulin aspart 100 UNIT/ML injection ?Commonly known as: novoLOG ?0-9 Units, Subcutaneous, 3 times daily with meals CBG < 70: Implement Hypoglycemia measures CBG 70 - 120: 0 units CBG 121 - 150: 1 unit CBG 151 - 200: 2 units CBG 201 - 250: 3 units CBG 251 - 300: 5 units CBG 301 - 350: 7 units CBG 351 - 400: 9 units CBG > 400: call MD ?  ?iron polysaccharides 150 MG capsule ?Commonly known as: NIFEREX ?Take 1 capsule (150 mg total) by mouth 2 (two) times daily. ?  ?latanoprost 0.005 % ophthalmic solution ?Commonly known as: XALATAN ?Place 1 drop into both eyes at bedtime. ?  ?levothyroxine 112 MCG tablet ?Commonly known as: SYNTHROID ?Take 112 mcg by mouth as directed. Take 1 tablet Mon-Fri ?  ?lisinopril 40 MG tablet ?Commonly known as: ZESTRIL ?Take 1 tablet (40 mg total) by mouth daily. ?  ?metoprolol tartrate 50 MG tablet ?Commonly known as: LOPRESSOR ?Take 1 tablet (50 mg total) by mouth 2 (two) times daily. ?  ?OneTouch Delica Lancets 95A Misc ?USE TO TEST  BLOOD SUGAR TWICE DAILY ?  ?pantoprazole 40 MG tablet ?Commonly known as: Protonix ?Take 1 tablet (40 mg total) by mouth 2 (two) times daily before a meal. ?  ?rosuvastatin 10 MG tablet ?Commonly known as: CRESTOR ?Take 1 tablet (10 mg total) by mouth daily. ?Start taking on: Jan 04, 2022 ?  ?vitamin B-12 100 MCG tablet ?Commonly known as: CYANOCOBALAMIN ?Take 100 mcg by mouth daily. ?  ? ?  ? ? Follow-up Information   ? ? [facility]. Schedule an appointment as soon as possible for a visit in 1 month(s).   ?Specialty: Neurology ?Why: stroke clinic ?Contact information: ?[place] ?[phone] ? ?  ?  ? ? Tracie Harrier, MD. Schedule an appointment as soon as possible for a visit in 1 week(s).   ?Specialty: Internal Medicine ?Contact information: ?[address] ?[facility] ?[place] [address] ?[phone] ? ? ?  ?  ? ?  ?  ? ?  ? ?Allergies  ?Allergen Reactions  ? Ezetimibe Other (See Comments)  ?  Unspecified ?Other reaction(s): Other (see comments) ?Other reaction(s): Other (See Comments) ?Unspecified ?Unspecified  ? Iodine Rash  ? ? ? ?Other Procedures/Studies: ?DG Chest 2 View ? ?Result Date: 12/26/2021 ?CLINICAL DATA:  Stroke. EXAM: CHEST - 2 VIEW COMPARISON:  AP chest 12/24/2021 FINDINGS: Cardiac silhouette is at the upper limits of normal size for AP technique. Mild calcification within aortic arch. Mildly decreased lung volumes. Horizontal linear densities overlying the left lung base with additional mild patchy airspace density again overlying the heart and left lower lung. No definite pleural effusion. No pneumothorax is seen. Mild dextrocurvature of the midthoracic spine with moderate multilevel degenerative disc changes. IMPRESSION: No significant change in low lung volumes with mild left basilar airspace opacities, possibly atelectasis versus pneumonia. Electronically Signed   By: Yvonne Kendall M.D.   On:  12/26/2021 08:42  ? ?MR ANGIO HEAD WO CONTRAST ? ?Addendum Date: 12/25/2021   ?ADDENDUM REPORT: 12/25/2021 07:51 ADDENDUM: Dr. Erlinda Hong called for second opinion on diffusion imaging which is positive for acute small vessel

## 2022-01-03 NOTE — H&P (Signed)
? ? ?Physical Medicine and Rehabilitation Admission H&P ? ?  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 ?HPI: Patricia Dudley is an 88 year old female who presented to the emergency department on 12/24/2021 complaining of aphasia, left gaze and slurred speech.  Family reported vomiting prior to arrival.  She was outside the window for tenecteplase therapy.  Code stroke called by EMS.  Initial head CT scan revealed extensive chronic ischemic changes throughout the brain.  Aspirin 81 mg initiated.  MRI of the brain showed small left basal ganglia/internal capsule infarct.  MRA were unremarkable.  Chest x-ray significant for mild airspace disease at the left base, favor atelectasis.  She was treated for presumptive aspiration pneumonia with Rocephin and azithromycin intravenously transition to oral Augmentin. ?SLP evaluation on 5/2 with evidence of mild aspiration risk.  Dysphagia 1 with thin liquids recommended.  Diet advanced to dysphagia 3/pur?ed meats with thin liquids on 5/4, but downgraded back to dysphagia 1 diet, crushed meds on 5/5. The patient requires inpatient medicine and rehabilitation evaluations and services for ongoing dysfunction secondary to left basal ganglia/internal capsule infarct. ? 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  Medical history is also significant for chronic kidney disease stage IIIb, hypothyroidism, diabetes mellitus, hypertension, glaucoma, hyperlipidemia ? ?Echocardiogram 12/25/2021 EF estimated 60 to 65% ?Hemoglobin A1c 6.5% 12/25/2021 ?LDL 110 12/25/2021, other lipid values within normal limits ? ?Daughters at bedside. State patient becomes somewhat agitated with stimulation. Incontinent of bowel and bladder. ? ? ? ?Review of Systems  ?Unable to perform ROS: Patient nonverbal (cognitive impairment/aphasia)  ?Past  Medical History:  ?Diagnosis Date  ? A-fib ([place])   ? CVA (cerebral vascular accident) [facility])   ? Dementia ([place])   ? Diabetes mellitus without complication ([place])   ? GI bleed   ? Hyperlipemia   ? Hypertension   ? Thyroid disease   ? ?Past Surgical History:  ?Procedure Laterality Date  ? COLONOSCOPY N/A 05/16/2017  ? Procedure: COLONOSCOPY;  Surgeon: Lin Landsman, MD;  Location[facility] ENDOSCOPY;  Service: Gastroenterology;  Laterality: N/A;  ? ESOPHAGOGASTRODUODENOSCOPY N/A 08/08/2016  ? Procedure: ESOPHAGOGASTRODUODENOSCOPY (EGD);  Surgeon: [name], MD;  Location[facility] ENDOSCOPY;  Service: Endoscopy;  Laterality: N/A;  ? ESOPHAGOGASTRODUODENOSCOPY N/A 05/16/2017  ? Procedure: ESOPHAGOGASTRODUODENOSCOPY (EGD);  Surgeon: Lin Landsman, MD;  Location: [facility] ENDOSCOPY;  Service: Gastroenterology;  Laterality: N/A;  ? ESOPHAGOGASTRODUODENOSCOPY (EGD) WITH PROPOFOL N/A 02/28/2017  ? Procedure: ESOPHAGOGASTRODUODENOSCOPY (EGD) WITH PROPOFOL;  Surgeon: [name], MD;  Location: [facility] ENDOSCOPY;  Service: Endoscopy;  Laterality: N/A;  ? EYE SURGERY    ? left  ? ?Family History  ?Problem Relation Age of Onset  ? Diabetes Mother   ? CVA Mother   ? Hypertension Mother   ? Diabetes Sister   ? Diabetes Brother   ? ?Social History:  reports that she has never smoked. She has never used smokeless tobacco. She reports that she does not drink alcohol and does not use drugs. ?Allergies:  ?Allergies  ?Allergen Reactions  ? Ezetimibe Other (See Comments)  ?  Unspecified ?Other reaction(s): Other (see comments) ?Other reaction(s): Other (See Comments) ?Unspecified ?Unspecified  ? Iodine Rash  ? ?Medications Prior to Admission  ?Medication Sig Dispense Refill  ? alendronate (FOSAMAX) 70 MG tablet Take 70 mg by mouth once a week. Take with a full glass of water on an empty stomach.    ?  cholecalciferol (VITAMIN D3) 25 MCG (1000 UNIT) tablet Take 1,000 Units by mouth daily.    ? glipiZIDE (GLUCOTROL XL) 10 MG 24 hr tablet  Take 1 tablet by mouth daily.    ? latanoprost (XALATAN) 0.005 % ophthalmic solution Place 1 drop into both eyes at bedtime.    ? levothyroxine (SYNTHROID, LEVOTHROID) 112 MCG tablet Take 112 mcg by mouth as directed. Take 1 tablet Mon-Fri    ? lisinopril (PRINIVIL,ZESTRIL) 40 MG tablet Take 1 tablet (40 mg total) by mouth daily. 30 tablet 0  ? torsemide (DEMADEX) 20 MG tablet Take 20 mg by mouth 2 (two) times daily as needed (swelling).    ? vitamin B-12 (CYANOCOBALAMIN) 100 MCG tablet Take 100 mcg by mouth daily.    ? [DISCONTINUED] amLODipine (NORVASC) 5 MG tablet Take 1 tablet (5 mg total) by mouth daily. 30 tablet 0  ? atorvastatin (LIPITOR) 10 MG tablet Take 1 tablet (10 mg total) by mouth daily. (Patient not taking: Reported on 12/24/2021) 30 tablet 0  ? fluticasone (FLONASE) 50 MCG/ACT nasal spray Place into the nose.    ? glucose blood test strip USE TO TEST BLOOD SUGAR TWICE DAILY    ? iron polysaccharides (NIFEREX) 150 MG capsule Take 1 capsule (150 mg total) by mouth 2 (two) times daily. (Patient not taking: Reported on 12/24/2021) 60 capsule 0  ? ondansetron (ZOFRAN) 4 MG tablet Take 1 tablet (4 mg total) by mouth every 8 (eight) hours as needed. (Patient not taking: Reported on 12/24/2021) 20 tablet 0  ? ONETOUCH DELICA LANCETS 81X MISC USE TO TEST BLOOD SUGAR TWICE DAILY    ? pantoprazole (PROTONIX) 40 MG tablet Take 1 tablet (40 mg total) by mouth 2 (two) times daily before a meal. (Patient not taking: Reported on 06/13/2017) 60 tablet 0  ? ? ? ? ?Home: ?Home Living ?Family/patient expects to be discharged to:: Private residence ?Living Arrangements: Children ?Available Help at Discharge: Family ?Type of Home: House ?Home Access: Level entry ?Home Layout: One level ?Bathroom Shower/Tub: Walk-in shower ?Bathroom Toilet: Standard ?Home Equipment: Rollator (4 wheels), Shower seat, Transport chair, BSC/3in1, Other (comment) (bedrails) ?Adaptive Equipment: Other (Comment) (bedrails) ?Additional Comments:  daughter in attendance to give history pt is not able to deliver ? Lives With: Family ?  ?Functional History: ?Prior Function ?Prior Level of Function : Needs assist, Other (comment) (daughter reports pt walked alone on rollator) ?Physical Assist : Mobility (physical) ?Mobility (physical): Gait, Transfers ?Mobility Comments: HHPT was seeing for Rollator walking, family helps with personal care ?ADLs Comments: pt reports daughter assist her with dressing ? ?Functional Status:  ?Mobility: ?Bed Mobility ?Overal bed mobility: Needs Assistance ?Bed Mobility: Supine to Sit, Sit to Supine, Rolling ?Rolling: Max assist ?Sidelying to sit: Max assist ?Supine to sit: Max assist ?Sit to supine: Total assist ?Sit to sidelying: Total assist ?General bed mobility comments: assist for LEs over EOB, pt assisted with push up to raise trunk, assist of bed pad for hips to EOB; to sidelying pt requird guidance for trunk and total assist to raise legs ?Transfers ?Overall transfer level: Needs assistance ?Equipment used: None ?Transfers: Sit to/from Stand ?Sit to Stand: Mod assist ?Bed to/from chair/wheelchair/BSC transfer type:: Step pivot ?Stand pivot transfers: Max assist ?Step pivot transfers: Mod assist ?General transfer comment: stood from EOB x 3 reps with each time improving upright posture and time standing (max 90 seconds). Pt incontinent of stool and NT in to assist with cleaning. Pt continuing to go and returned to bed  for more complete pericare. ?Ambulation/Gait ?General Gait Details: pt with incontinence of bowels; session focused on standing and transfer back to bed for thorough cleaning ?  ? ?ADL: ?ADL ?Overall ADL's : Needs assistance/impaired ?Eating/Feeding: Maximal assistance, Sitting ?Eating/Feeding Details (indicate cue type and reason): worked on self feeding and drinking at EOB ?Grooming: Wash/dry hands, Sitting, Total assistance ?Lower Body Bathing: Total assistance, Bed level ?Lower Body Bathing Details (indicate  cue type and reason): simulated applying lotion to BLEs with mod assist to complete upper legs, hand over hand to sequence to complete task.  Overall max assist to complete full task ?Upper Body Dressing : Total

## 2022-01-03 NOTE — Discharge Summary (Signed)
Physician Discharge Summary  Patient ID: Patricia Dudley MRN: [id number] DOB/AGE: [birth year] 86 y.o.  Admit date: 01/03/2022 Discharge date: 01/31/2022  Discharge Diagnoses:  Principal Problem:   Acute embolic stroke [facility]) Active Problems:   Chronic kidney disease   Hypernatremia   Hyperkalemia Active problems: Debility secondary to left basal ganglia/internal capsule infarct Dysphagia Aphasia Paroxysmal atrial fibrillation Acute kidney injury on chronic kidney disease stage IIIb-stage IV Hypothyroidism Diabetes mellitus Hypertension Glaucoma Hyperlipidemia Anemia Neurogenic bladder Stool incontinence Vitamin D deficiency  Discharged Condition: Stable  Significant Diagnostic Studies:  Labs:  Basic Metabolic Panel:    Latest Ref Rng & Units 01/30/2022    6:36 AM 01/29/2022    5:22 AM 01/28/2022    6:27 AM  BMP  Glucose 70 - 99 mg/dL 97  164  169   BUN 8 - 23 mg/dL 52  57  52   Creatinine 0.44 - 1.00 mg/dL 3.05  3.08  3.35   Sodium 135 - 145 mmol/L 140  140  136   Potassium 3.5 - 5.1 mmol/L 4.6  4.6  4.7   Chloride 98 - 111 mmol/L 112  110  108   CO2 22 - 32 mmol/L 21  23  22    Calcium 8.9 - 10.3 mg/dL 8.2  8.1  8.1         Latest Ref Rng & Units 01/28/2022    6:27 AM 01/21/2022    5:26 AM 01/14/2022    6:18 AM  CBC  WBC 4.0 - 10.5 K/uL 6.6  7.0  8.5   Hemoglobin 12.0 - 15.0 g/dL 8.3  8.0  9.9   Hematocrit 36.0 - 46.0 % 24.9  25.2  31.2   Platelets 150 - 400 K/uL 254  293  521      Family history.  Mother with diabetes CVA and hypertension.  Sister and brother with diabetes.  Denies a colon cancer esophageal cancer or rectal cancer  Brief HPI:   Patricia Dudley is a 88 y.o. female with history of PAF not on anticoagulation secondary to known arteriovenous malformation of duodenum with GI bleed 2018, chronic kidney disease stage III hypothyroidism diabetes mellitus hypertension glaucoma and hyperlipidemia.  Admitted on 12/24/2021 secondary to aphasia, left gaze  and slurred speech.  Code stroke called by EMS.  Aspirin therapy initiated.  MRI of the brain showed small left basal ganglia/internal capsule infarct.  Neurology was consulted.  Chest x-ray was significant for lower lobe infiltrate and she completed course of antibiotics including IV and oral.  Initially maintained on dysphagia #1 thin liquid diet and diet slowly advance as indicated.  Therapy evaluations completed due to patient decreased functional mobility was admitted for comprehensive rehab program.   Hospital Course: Patricia Dudley was admitted to rehab 01/03/2022 for inpatient therapies to consist of PT, ST and OT at least three hours five days a week. Past admission physiatrist, therapy team and rehab RN have worked together to provide customized collaborative inpatient rehab. Moisture associated skin damage noted to buttocks and local care with [place] provided. Bladder program started on admission.Type 7 stools noted and sample sent for C diff toxin assay>>negative. Lisinopril increase to 40 mg. Elevated serum creatinine to 3.2 and lisinopril discontinued and patient given IVFs. Scr 2.78 on 5/15. Continuous IVFs at 75 cc/her continued. Hgb drift to 6.6 on 5/15. No hematochezia or melena. One unit of PRBCs given. Hgb up to 8.7 after transfusion. Creatinine improved to 2.78>>2.69. Protonix initiated due to history of GIB (  small bowel AVMs). H and H continued upward trend. Continent of bladder at times 5/19. Creatinine has continued to rise despite IVFs. Elevated serum chloride and sodium. Consulted Dr. Augustin Coupe with [facility] on 5/23.  She sees Dr. Candiss Norse in [place] with history of CKD 3-4. Acute component likely from prerenal azotemia. Slow correction of hyponatremia with D5W then 1/2 normal saline, then 1/2NS and encouraged PO thickened water every 2-4 hours. Palliative care consult on 5/26 to discuss goals of care. MBS discussed with SLP and shows high risk of aspiration with thin liquids. Nephrology  continued to adjust IVFs to address hypernatremia. Elevated chloride and sodium resolved 5/29. Follow-up with PCM continued with multiple family members.  Blood pressures were monitored on TID basis and lisinopril increased to 40 mg daily 5/12. Amlodipine and Lopressor continued. Lopressor decreased to 25 mg secondary to bradycardia then discontinued on 5/14. Lisinopril discontinued due to AKI. BP soft therefore IVFs with NS at bedtime for 12 hours began on 5/18. Control improved on amlodipine 10 mg and Magonate   Diabetes has been monitored with ac/hs CBG checks and SSI sensitive was use prn for tighter BS control.  Diabetic coordinator consulted on 5/15. Ensure between meals discontinued. Home glipizide 2.5 mg started on 5/17 and increased to 10 mg daily. Semglee 11 units at bedtime. CBGs remain labile and Semglee increased up to 14 units daily.   Rehab course: During patient's stay in rehab weekly team conferences were held to monitor patient's progress, set goals and discuss barriers to discharge. At admission, patient required total assistance with ADLs, transfers and mobility.  Physical exam Blood pressure 152/64 pulse 89 temperature 98.1 respirations 14 oxygen saturation is 97% room air Constitutional.  No acute distress HEENT Head.  Normocephalic mild right facial droop Eyes.  Pupils round reactive to light no discharge without nystagmus Neck.  Supple nontender no JVD without thyromegaly Cardiac regular rate and rhythm without any extra sounds or murmur heard Abdomen.  Soft nontender positive bowel sounds without rebound Respiratory effort normal no respiratory distress without wheeze Abdomen.  Soft nontender positive bowel sounds without rebound Musculoskeletal Comments.  Left grip and biceps 5 -/5 Right upper extremity 3/5 in grip and bicep Could not get patient to participate in movement left lower extremity Right lower extremity least 2 -/5.movement by tickling right  foot Neurologic.  Patient squeezes hand on command does not protrude tongue when shown/asked.  No verbal expression shaking head yes or no.  She  has had improvement in activity tolerance, balance, postural control as well as ability to compensate for deficits. She has had improvement in functional use RUE/LUE  and RLE/LLE as well as improvement in awareness.  Therapy demonstrated car transfers with patient and family using rolling walker max assist.  Discussed how the second person could assist from inside the vehicle when needed.  Family education for ADLs.  For transfers demonstrated to all family members how to do hand placement on her back to facilitate forward weight shift from hip lift, squat pivot to her right moving right side-lying and then supine.  SLP follow-up provided providing family teaching.  SLP educated family to utilize yes/no questions with max multimodal cues and patient is nonverbal responses of head nods eye contact and body language for communication.  Maintain on a dysphagia #1 nectar thick diet with full teaching completed to family.  Full family teaching ongoing family did not feel they can provide the necessary physical assistance at home and recommendations were made for skilled nursing  facility       Disposition: Skilled nursing facility Diet: Diabetic/dysphagia 1 nectar liquids  Special Instructions:  No driving, alcohol consumption or tobacco use.   Medications at discharge 1.  Tylenol as needed 2.  Norvasc 10 mg p.o. daily 3.  Aspirin 81 mg p.o. daily 4.  Vitamin D 1000 units p.o. daily 5.  Glucotrol 10 mg p.o. daily after supper 6.  Lantus insulin 17 units nightly 8.Xalatan ophthalmic solution 0.05% 1 drop both eyes bedtime 9.  Synthroid 112 mcg p.o. daily 10.  Claritin 10 mg p.o. daily 11.  Magnesium gluconate 250 mg p.o. nightly 12.  Robaxin 500 mg every 6 hours as needed muscle spasms 13.  Lopressor 12.5 mg p.o. twice daily 14.  Multivitamin  daily 15.  Protonix 40 mg p.o. daily 16.  FiberCon 625 mg p.o. daily 17.  Trazodone 25-50 mg nightly as needed sleep 18.  Vitamin B12 100 mcg p.o. daily 19.  Niferex 150 mg twice daily    30-35 minutes were spent completing discharge summary and discharge planning  The patient was started on Crestor 10 mg daily by Dr. Erlinda Hong. The patient's daughter, [name], informed me that the patient was on a statin in the past with skin blistering and joint pain and asked that it be discontinued. Discharge Instructions     Ambulatory referral to Physical Medicine Rehab   Complete by: As directed    Follow-up 1 month left basal ganglia infarction.  Patient is for skilled nursing facility placement        Follow-up Information     Raulkar, Clide Deutscher, MD Follow up.   Specialty: Physical Medicine and Rehabilitation Why: office will call you to arrange your appt (sent) Contact information: [address] [place] [address] [phone]         Tracie Harrier, MD Follow up.   Specialty: Internal Medicine Why: Call tomorrow to make arrangements for hospital follow-up appointment Contact information: [address] [place] [address] [phone]         [facility] Follow up.   Why: Call tomorrow to make arrangements for hospital follow-up appointment Contact information: [address]     [address]  [place] [address] [phone]                Signed: Cathlyn Parsons 01/31/2022, 5:26 AM

## 2022-01-03 NOTE — Progress Notes (Signed)
PT Cancellation Note ? ?Patient Details ?Name: Patricia Dudley ?MRN: [id number] ?DOB: May 22, 1930 ? ? ?Cancelled Treatment:    Reason Eval/Treat Not Completed: Medical issues which prohibited therapy ? ?Patient in bed having had large volume of diarrhea. Nursing made aware.  ? ? ?Arby Barrette, PT ?Acute Rehabilitation Services  ?Pager [phone] ?Office [phone] ? ? ?[name] ?01/03/2022, 11:07 AM ?

## 2022-01-03 NOTE — TOC Transition Note (Signed)
Transition of Care (TOC) - CM/SW Discharge Note ? ? ?Patient Details  ?Name: Patricia Dudley ?MRN: [id number] ?Date of Birth: 02-25-1930 ? ?Transition of Care (TOC) CM/SW Contact:  ?Cyndi Bender, RN ?Phone Number: ?01/03/2022, 11:33 AM ? ? ?Clinical Narrative:    ? ?Patient stable for transfer to CIR today ? ?  ?Barriers to Discharge: Insurance Authorization ? ? ?Patient Goals and CMS Choice ?Patient states their goals for this hospitalization and ongoing recovery are:: Rehab ?CMS Medicare.gov Compare Post Acute Care list provided to:: Patient Represenative (must comment) ?Choice offered to / list presented to : Adult Children ? ?Discharge Placement ?  ?           ?  ?  ?  ?  ? ?Discharge Plan and Services ?In-house Referral: Clinical Social Work ?  ?Post Acute Care Choice: IP Rehab          ?  ?  ?  ?  ?  ?  ?  ?  ?  ?  ? ?Social Determinants of Health (SDOH) Interventions ?  ? ? ?Readmission Risk Interventions ?   ? View : No data to display.  ?  ?  ?  ? ? ? ? ? ?

## 2022-01-04 DIAGNOSIS — I639 Cerebral infarction, unspecified: Secondary | ICD-10-CM | POA: Diagnosis not present

## 2022-01-04 LAB — GLUCOSE, CAPILLARY
Glucose-Capillary: 202 mg/dL — ABNORMAL HIGH (ref 70–99)
Glucose-Capillary: 218 mg/dL — ABNORMAL HIGH (ref 70–99)
Glucose-Capillary: 162 mg/dL — ABNORMAL HIGH (ref 70–99)
Glucose-Capillary: 163 mg/dL — ABNORMAL HIGH (ref 70–99)

## 2022-01-04 LAB — COMPREHENSIVE METABOLIC PANEL
ALT: 36 U/L (ref 0–44)
AST: 30 U/L (ref 15–41)
Albumin: 2.7 g/dL — ABNORMAL LOW (ref 3.5–5.0)
Alkaline Phosphatase: 78 U/L (ref 38–126)
Anion gap: 6 (ref 5–15)
BUN: 28 mg/dL — ABNORMAL HIGH (ref 8–23)
CO2: 26 mmol/L (ref 22–32)
Calcium: 8.9 mg/dL (ref 8.9–10.3)
Chloride: 110 mmol/L (ref 98–111)
Creatinine, Ser: 2.28 mg/dL — ABNORMAL HIGH (ref 0.44–1.00)
GFR, Estimated: 20 mL/min — ABNORMAL LOW (ref >60)
Glucose, Bld: 214 mg/dL — ABNORMAL HIGH (ref 70–99)
Potassium: 4.7 mmol/L (ref 3.5–5.1)
Sodium: 142 mmol/L (ref 135–145)
Total Bilirubin: 0.8 mg/dL (ref 0.3–1.2)
Total Protein: 6.1 g/dL — ABNORMAL LOW (ref 6.5–8.1)

## 2022-01-04 MED ORDER — LISINOPRIL 20 MG PO TABS
40.0000 mg | ORAL_TABLET | Freq: Every day | ORAL | Status: DC
Start: 2022-01-05 — End: 2022-01-05
  Administered 2022-01-05: 40 mg via ORAL
  Filled 2022-01-04: qty 2

## 2022-01-04 MED ORDER — METOPROLOL TARTRATE 25 MG PO TABS
25.0000 mg | ORAL_TABLET | Freq: Two times a day (BID) | ORAL | Status: DC
Start: 2022-01-04 — End: 2022-01-06
  Administered 2022-01-04 – 2022-01-05 (×2): 25 mg via ORAL
  Filled 2022-01-04 (×4): qty 1

## 2022-01-04 NOTE — Plan of Care (Signed)
?  Problem: RH Swallowing ?Goal: LTG Patient will consume least restrictive diet using compensatory strategies with assistance (SLP) ?Description: LTG:  Patient will consume least restrictive diet using compensatory strategies with assistance (SLP) ?Flowsheets (Taken 01/04/2022 1324) ?LTG: Pt Patient will consume least restrictive diet using compensatory strategies with assistance of (SLP): Moderate Assistance - Patient 50 - 74% ?  ?Problem: RH Comprehension Communication ?Goal: LTG Patient will comprehend basic/complex auditory (SLP) ?Description: LTG: Patient will comprehend basic/complex auditory information with cues (SLP). ?Flowsheets (Taken 01/04/2022 1324) ?LTG: Patient will comprehend: Basic auditory information ?LTG: Patient will comprehend auditory information with cueing (SLP): Maximal Assistance - Patient 25 - 49% ?  ?Problem: RH Expression Communication ?Goal: LTG Patient will express needs/wants via multi-modal(SLP) ?Description: LTG:  Patient will express needs/wants via multi-modal communication (gestures/written, etc) with cues (SLP) ?Flowsheets (Taken 01/04/2022 1324) ?LTG: Patient will express needs/wants via multimodal communication (gestures/written, etc) with cueing (SLP): Total Assistance - Patient < 25% ?  ?Problem: RH Attention ?Goal: LTG Patient will demonstrate this level of attention during functional activites (SLP) ?Description: LTG:  Patient will will demonstrate this level of attention during functional activites (SLP) ?Flowsheets (Taken 01/04/2022 1324) ?Patient will demonstrate during cognitive/linguistic activities the attention type of: Focused ?Patient will demonstrate this level of attention during cognitive/linguistic activities in: Controlled ?LTG: Patient will demonstrate this level of attention during cognitive/linguistic activities with assistance of (SLP): Moderate Assistance - Patient 50 - 74% ?Number of minutes patient will demonstrate attention during cognitive/linguistic  activities: 5 ?  ?

## 2022-01-04 NOTE — Evaluation (Signed)
Speech Language Pathology Assessment and Plan ? ?Patient Details  ?Name: Patricia Dudley ?MRN: [id number] ?Date of Birth: 08-01-1930 ? ?SLP Diagnosis: Dysphagia;Cognitive Impairments;Speech and Language deficits  ?Rehab Potential: Fair ?ELOS: 3-4 weeks  ? ? ?Today's Date: 01/04/2022 ?SLP Individual Time: 8416-6063 ?SLP Individual Time Calculation (min): 50 min ? ? ?Hospital Problem: Principal Problem: ?  Acute embolic stroke [facility]) ? ?Past Medical History:  ?Past Medical History:  ?Diagnosis Date  ? A-fib ([place])   ? CVA (cerebral vascular accident) [facility])   ? Dementia ([place])   ? Diabetes mellitus without complication ([place])   ? GI bleed   ? Hyperlipemia   ? Hypertension   ? Thyroid disease   ? ?Past Surgical History:  ?Past Surgical History:  ?Procedure Laterality Date  ? COLONOSCOPY N/A 05/16/2017  ? Procedure: COLONOSCOPY;  Surgeon: Lin Landsman, MD;  Location: [facility] ENDOSCOPY;  Service: Gastroenterology;  Laterality: N/A;  ? ESOPHAGOGASTRODUODENOSCOPY N/A 08/08/2016  ? Procedure: ESOPHAGOGASTRODUODENOSCOPY (EGD);  Surgeon: [name], MD;  Location: [facility] ENDOSCOPY;  Service: Endoscopy;  Laterality: N/A;  ? ESOPHAGOGASTRODUODENOSCOPY N/A 05/16/2017  ? Procedure: ESOPHAGOGASTRODUODENOSCOPY (EGD);  Surgeon: Lin Landsman, MD;  Location: [facility] ENDOSCOPY;  Service: Gastroenterology;  Laterality: N/A;  ? ESOPHAGOGASTRODUODENOSCOPY (EGD) WITH PROPOFOL N/A 02/28/2017  ? Procedure: ESOPHAGOGASTRODUODENOSCOPY (EGD) WITH PROPOFOL;  Surgeon: [name], MD;  Location: [facility] ENDOSCOPY;  Service: Endoscopy;  Laterality: N/A;  ? EYE SURGERY    ? left  ? ? ?Assessment / Plan / Recommendation ?Clinical Impression HPI: Patricia Dudley is an 88 year old female who presented to the emergency department on 12/24/2021 complaining of aphasia, left gaze and slurred speech.  Family reported vomiting prior to arrival.  She was outside the window for tenecteplase therapy.  Code stroke called by EMS.  Initial head CT scan revealed  extensive chronic ischemic changes throughout the brain.  Aspirin 81 mg initiated.  MRI of the brain showed small left basal ganglia/internal capsule infarct.  MRA were unremarkable.  Chest x-ray significant for mild airspace disease at the left base, favor atelectasis.  She was treated for presumptive aspiration pneumonia with Rocephin and azithromycin intravenously transition to oral Augmentin. ?SLP evaluation on 5/2 with evidence of mild aspiration risk.  Dysphagia 1 with thin liquids recommended.  Diet advanced to dysphagia 3/pur?ed meats with thin liquids on 5/4, but downgraded back to dysphagia 1 diet, crushed meds on 5/5. The patient requires inpatient medicine and rehabilitation evaluations and services for ongoing dysfunction secondary to left basal ganglia/internal capsule infarct. ?  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  Medical history is also significant for chronic kidney disease stage IIIb, hypothyroidism, diabetes mellitus, hypertension, glaucoma, hyperlipidemia. ? ?Pt presents with profound impairment in auditory comprehension, verbal expression, and cognitive skills as evident by absent verbalizations + functional communication via alternative means (gestures, facial expressions, writing, etc), minimal to no command following despite Max multimodal A within functional contexts, as well as poor focused attention and awareness. Hearing aids and pocket talker utilized throughout. No eyeglasses available for today's evaluation.  ? ?This appears to be a change in her presentation during acute admission with documentation stating pt was pointing to words for communicative purposes and following some basic commands. Unable to complete OME and CSE due to pt unwilling/unable to participate. No family present. Per chart review, appears pt with minimal  verbalizations at baseline, though lived alone. Per chart  review, tolerating Dysphagia 1 textures with thin liquids given total assistance for feeding. ? ?Given assessment findings, pt would benefit from trial of skilled ST intervention targeting communication and basic cognitive-linguistic skills to assess stimulability for skilled interventions, as well as for family education. Pt unable to verbalize agreement with plan. Please see below for additional details. ?  ?Skilled Therapeutic Interventions          Informal + skilled observation/assessment measures ?  ?SLP Assessment ? Patient will need skilled Speech Lanaguage Pathology Services during CIR admission  ?  ?Recommendations ? SLP Diet Recommendations: Dysphagia 1 (Puree);Thin (per chart) ?Liquid Administration via: Cup;Straw (per chart) ?Medication Administration: Crushed with puree ?Supervision: Staff to assist with self feeding;Full supervision/cueing for compensatory strategies ?Compensations: Slow rate;Small sips/bites;Minimize environmental distractions ?Postural Changes and/or Swallow Maneuvers: Seated upright 90 degrees;Upright 30-60 min after meal ?Oral Care Recommendations: Oral care QID ?Patient destination: Home ?Follow up Recommendations: 24 hour supervision/assistance ?Equipment Recommended: To be determined  ?  ?SLP Frequency 3 to 5 out of 7 days   ?SLP Duration ? ?SLP Intensity ? ?SLP Treatment/Interventions 3-4 weeks ? ?Minumum of 1-2 x/day, 30 to 90 minutes ? ?Speech/Language facilitation;Patient/family education;Multimodal communication approach;Therapeutic Activities   ? ?Pain ?Pain Assessment ?Pain Scale: 0-10 ?Pain Score: 0-No pain - though unable to fully assess; NAD ? ?Prior Functioning ?Cognitive/Linguistic Baseline: Baseline deficits ?Baseline deficit details: Per acute ST note, pt has dementia, daughter present [name] does not live with her; reports pt's needs were often anticipated and therefore she did not have to communicate  much ?Type of Home: House ? Lives With: Family ?Available Help at Discharge: Family ?Education: UTD ? ?SLP Evaluation ?Cognition ?Overall Cognitive Status: Difficult to assess ?Arousal/Alertness: Lethargic ?Orientation Level: Other (comment) (Localized to therapist when her name was called; unable to state name) ?Attention: Focused ?Focused Attention: Impaired ?Focused Attention Impairment: Verbal basic ?Memory: Impaired ?Awareness: Impaired ?Awareness Impairment: Intellectual impairment ?Problem Solving: Impaired ?Problem Solving Impairment: Functional basic;Verbal basic ?Safety/Judgment: Impaired ?Comments: Pt was non-verbal and unable to follow commands without HOH assistance, no family member present  ?Comprehension ?Auditory Comprehension ?Overall Auditory Comprehension: Impaired ?Yes/No Questions: Impaired ?Basic Biographical Questions: 0-25% accurate (0% - did not respond/attempt to answer via any modality) ?Basic Immediate Environment Questions: 0-24% accurate (0% - did not respond/attempt to answer via any modality) ?One Step Basic Commands: 0-24% accurate (10% - required [facility] assistance) ?Conversation:  (Did not converse/verbalize) ?Interfering Components: Attention;Motor planning;Working Marine scientist;Hearing;Other (comment) (suspect visual impairment - per chart, pt with glaucoma) ?EffectiveTechniques: Extra processing time;Repetition;Visual/Gestural cues;Slowed speech;Increased volume;Stressing words ?Reading Comprehension ?Reading Status: Unable to assess (comment) ?Interfering Components: Attention (? visual acuity) ?Expression ?Expression ?Primary Mode of Expression: Other (comment) (UTA) ?Verbal Expression ?Overall Verbal Expression: Impaired ?Initiation: Impaired ?Repetition: Impaired (Pt did not imitate words nor motor movements) ?Naming: Not tested (Pt unable to participate) ?Written Expression ?Dominant Hand: Right ?Written Expression: Not tested ?Oral Motor ?Oral Motor/Sensory Function ?Overall Oral  Motor/Sensory Function:  (UTA) ?Motor Speech ?Overall Motor Speech: Other (comment) (UTA - pt non-verbal) ?Motor Planning: Not tested (UTA - pt non-verbal) ? ?Care Tool ?Care Tool Cognition ?Ability to hear (

## 2022-01-04 NOTE — Progress Notes (Signed)
Inpatient Rehabilitation Care Coordinator ?Assessment and Plan ?Patient Details  ?Name: Patricia Dudley ?MRN: [id number] ?Date of Birth: 1929/11/01 ? ?Today's Date: 01/04/2022 ? ?Hospital Problems: Principal Problem: ?  Acute embolic stroke [facility]) ? ?Past Medical History:  ?Past Medical History:  ?Diagnosis Date  ? A-fib ([place])   ? CVA (cerebral vascular accident) [facility])   ? Dementia ([place])   ? Diabetes mellitus without complication ([place])   ? GI bleed   ? Hyperlipemia   ? Hypertension   ? Thyroid disease   ? ?Past Surgical History:  ?Past Surgical History:  ?Procedure Laterality Date  ? COLONOSCOPY N/A 05/16/2017  ? Procedure: COLONOSCOPY;  Surgeon: Lin Landsman, MD;  Location: [facility] ENDOSCOPY;  Service: Gastroenterology;  Laterality: N/A;  ? ESOPHAGOGASTRODUODENOSCOPY N/A 08/08/2016  ? Procedure: ESOPHAGOGASTRODUODENOSCOPY (EGD);  Surgeon: [name], MD;  Location: [facility] ENDOSCOPY;  Service: Endoscopy;  Laterality: N/A;  ? ESOPHAGOGASTRODUODENOSCOPY N/A 05/16/2017  ? Procedure: ESOPHAGOGASTRODUODENOSCOPY (EGD);  Surgeon: Lin Landsman, MD;  Location: [facility] ENDOSCOPY;  Service: Gastroenterology;  Laterality: N/A;  ? ESOPHAGOGASTRODUODENOSCOPY (EGD) WITH PROPOFOL N/A 02/28/2017  ? Procedure: ESOPHAGOGASTRODUODENOSCOPY (EGD) WITH PROPOFOL;  Surgeon: [name], MD;  Location: [facility] ENDOSCOPY;  Service: Endoscopy;  Laterality: N/A;  ? EYE SURGERY    ? left  ? ?Social History:  reports that she has never smoked. She has never used smokeless tobacco. She reports that she does not drink alcohol and does not use drugs. ? ?Family / Support Systems ?Marital Status: Widow/Widower ?How Long?: Passes in 1996 ?Patient Roles: Parent ?Spouse/Significant Other: Widowed ?Children: Mother of 10 adult children ?Other Supports: 5 adult children currently help with providing care ?Anticipated Caregiver: Children are rotating care between 5 adult children ?Ability/Limitations of Caregiver: Pt dtr [name]/HCPOA [phone])  going to granddaughter's graduation 5/25-5/31 and will not be available. There will be other siblings here- [place] next best person to contact when she is away. Also reports all of the siblings are pretty petite and need to see her progress on if they will physically be able to assist her. ?Caregiver Availability: 24/7 ?Family Dynamics: Pt lives alone but 5 of her adult children rotate providing care to her on a daily basis. Other children periodically check in atleast 3xs per day to ensure they are both doing well. ? 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 ?Education: high school grad ?Health Literacy - How often do you need to have someone help you when you read instructions, pamphlets, or other written material from your doctor or pharmacy?: Patient unable to respond ?Writes: Yes ?Employment Status: Retired ?Age Retired: 58 ?Legal History/Current Legal Issues: Dtr denies ?Guardian/Conservator: N/A  ? ?Abuse/Neglect ?Abuse/Neglect Assessment Can Be Completed: Unable to assess, patient is non-responsive or altered mental status ?Physical Abuse: Denies ?Verbal Abuse: Denies ?Sexual Abuse: Denies ?Exploitation of patient/patient's resources: Denies ?Self-Neglect: Denies ? ?Patient response to: ?Social Isolation - How often do you feel lonely or isolated from those around you?: Patient unable to respond ? ?Emotional Status ?Pt's affect, behavior and adjustment status: Pt unable to respond at this time. ?Recent Psychosocial Issues: Pt dtr denies ?Psychiatric History: Dtr denies ?Substance Abuse History: dtr denies ? ?Patient / Family Perceptions, Expectations & Goals ?Pt/Family understanding of illness & functional limitations: Pt family has a general understanding of pt care needs based on  the amount of care  they were previously providing. ?Premorbid pt/family roles/activities: Needed help with ADLs/IADLs ?Anticipated changes in roles/activities/participation: No changes ?Pt/family expectations/goals: Family's goal is for her to regain mobility/walking, and arm strength ? ?Community Resources ?Community Agencies: None ?Premorbid Home Care/DME Agencies: Other (Comment) ([place]) ?Transportation available at discharge: TBD ?Is the patient able to respond to transportation needs?: Yes ?In the past 12 months, has lack of transportation kept you from medical appointments or from getting medications?: No ?In the past 12 months, has lack of transportation kept you from meetings, work, or from getting things needed for daily living?: No ?Resource referrals recommended: Neuropsychology ? ?Discharge Planning ?Living Arrangements: Children ?Support Systems: Children ?Type of Residence: Private residence ?Insurance Resources: Multimedia programmer (specify) ([employer] Medicare) ?Financial Resources: Social Security ?Financial Screen Referred: No ?Living Expenses: Own ?Money Management: Family ?Does the patient have any problems obtaining your medications?: No ?Home Management: Dtr [name] reports she manages finances, and all children help with housecleaning and meal prep. ?Patient/Family Preliminary Plans: No changes ?Care Coordinator Barriers to Discharge: Insurance for SNF coverage, Other (comments) ?Care Coordinator Barriers to Discharge Comments: Pt may be too much care for children as they may not be physically able to assist her ?Care Coordinator Anticipated Follow Up Needs: HH/OP ? ?Clinical Impression ?This SW covering for primary SW, Erlene Quan.  ? ?SW spoke with pt dtr [name] who is HCPOA.  SW introduced self, explained role, and discussed discharge process. HCPOA on file, and placed in soft chart. Pt is not a English as a second language teacher. DME: cane (but not able to use), RW, BSC, and bed monitor (alarm). She is aware there  will be follow-up to provide ELOS, and further updates after team conference.  ? ?Rana Snare ?01/04/2022, 12:19 PM ? ?  ?

## 2022-01-04 NOTE — Progress Notes (Signed)
?                                                       PROGRESS NOTE ? ? ?Subjective/Complaints: ?Not following SLP commands this morning ?Very sleepy during session. Messaged Ben and SW regarding aiming for later sessions and seeing whether daughters can be present for therapy.  ? ?ROS: too lethargic to obtain ? ?Objective: ?  ?No results found. ?No results for input(s): WBC, HGB, HCT, PLT in the last 72 hours. ?Recent Labs  ?  01/04/22 ?7782  ?NA 142  ?K 4.7  ?CL 110  ?CO2 26  ?GLUCOSE 214*  ?BUN 28*  ?CREATININE 2.28*  ?CALCIUM 8.9  ? ? ?Intake/Output Summary (Last 24 hours) at 01/04/2022 1112 ?Last data filed at 01/03/2022 1812 ?Gross per 24 hour  ?Intake 240 ml  ?Output --  ?Net 240 ml  ?  ? ?  ? ?Physical Exam: ?Vital Signs ?Blood pressure (!) 154/74, pulse (!) 54, temperature 98.4 ?F (36.9 ?C), temperature source Oral, resp. rate 14, height 5\' 5"  (1.651 m), weight 55.8 kg, SpO2 97 %. ? ?Gen: no distress, normal appearing ?HENT: oral mucosa pink and moist, NCAT ?Eyes:  ?   General:     ?   Right eye: No discharge.     ?   Left eye: No discharge.  ?   Comments: Left lid closed ?R eye lid more open  ?Cardiovascular:  ?   Rate and Rhythm: Bradycardic ?   Heart sounds: Normal heart sounds. No murmur heard. ?  No gallop.  ?Pulmonary:  ?   Effort: Pulmonary effort is normal. No respiratory distress.  ?   Breath sounds: Normal breath sounds. No wheezing or rales.  ?Abdominal:  ?   General: Bowel sounds are normal. There is no distension.  ?   Palpations: Abdomen is soft.  ?   Tenderness: There is no abdominal tenderness.  ?Musculoskeletal:  ?   Cervical back: Neck supple.  ?   Comments: L grip and biceps 5-/5 ?RUE- 3/5 in grip and bicep- cannot test more ?Couldn't get pt to participate in movement LLE ?RLE- at least 2-/5- got movement by tickling R foot- not to exam  ?Skin: ?   General: Skin is warm and dry.  ?Neurological:  ?   Mental Status: She is alert.  ?   Comments: Squeezes hands on command, does not protrude  tongue when shown/asked ?No verbal expression shaking head yes or no-  ?Fell asleep 3x during attempted exam  ?Psychiatric:  ?   Comments: Flat- nonverbal  ? ?Assessment/Plan: ?1. Functional deficits which require 3+ hours per day of interdisciplinary therapy in a comprehensive inpatient rehab setting. ?Physiatrist is providing close team supervision and 24 hour management of active medical problems listed below. ?Physiatrist and rehab team continue to assess barriers to discharge/monitor patient progress toward functional and medical goals ? ?Care Tool: ? ?Bathing ?   ?   ? Body parts bathed by helper: Right arm, Left arm, Chest, Abdomen, Front perineal area, Buttocks, Right upper leg, Left upper leg, Right lower leg, Left lower leg, Face ?Body parts n/a: Front perineal area, Buttocks ?  ?Bathing assist Assist Level: Dependent - Patient 0% ?  ?  ?Upper Body Dressing/Undressing ?Upper body dressing   ?What is the patient wearing?: Pull over shirt ?   ?  Upper body assist Assist Level: Total Assistance - Patient < 25% ?   ?Lower Body Dressing/Undressing ?Lower body dressing ? ? ?   ?What is the patient wearing?: Incontinence brief ? ?  ? ?Lower body assist Assist for lower body dressing: 2 Helpers (bed level) ?   ? ?Toileting ?Toileting    ?Toileting assist Assist for toileting: 2 Helpers (bed level) ?  ?  ?Transfers ?Chair/bed transfer ? ?Transfers assist ?   ? ?Chair/bed transfer assist level: 2 Helpers ?  ?  ?Locomotion ?Ambulation ? ? ?Ambulation assist ? ?   ? ?  ?  ?   ? ?Walk 10 feet activity ? ? ?Assist ?   ? ?  ?   ? ?Walk 50 feet activity ? ? ?Assist   ? ?  ?   ? ? ?Walk 150 feet activity ? ? ?Assist   ? ?  ?  ?  ? ?Walk 10 feet on uneven surface  ?activity ? ? ?Assist   ? ? ?  ?   ? ?Wheelchair ? ? ? ? ?Assist   ?  ?  ? ?  ?   ? ? ?Wheelchair 50 feet with 2 turns activity ? ? ? ?Assist ? ?  ?  ? ? ?   ? ?Wheelchair 150 feet activity  ? ? ? ?Assist ?   ? ? ?   ? ?Blood pressure (!) 154/74, pulse (!) 54,  temperature 98.4 ?F (36.9 ?C), temperature source Oral, resp. rate 14, height 5\' 5"  (1.651 m), weight 55.8 kg, SpO2 97 %. ? ? ? Medical Problem List and Plan: ?1. 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 ?            -patient may  shower ?            -ELOS/Goals: ~ 3 weeks- min A ? -Continue CIR ?2.  Antithrombotics: ?-DVT/anticoagulation: Heparin [place] TID  ?            -antiplatelet therapy: aspirin 81 mg daily ?3. Pain Management: Tylenol prn ?4. Mood: LCSW to evaluate and provide emotional support ?            -antipsychotic agents: n/a ?5. Neuropsych: This patient is not capable of making decisions on her own behalf. ?6. Skin/Wound Care: Routine skin care checks ?7. Fluids/Electrolytes/Nutrition: Routine Is and Os and follow-up chemistries ?            -- Dysphagia 1 diet with thin liquids.  Crushed meds with applesauce.  SLP evaluation. ?            -- Continue nutritional supplements, B12, MVI ?8: Paroxysmal atrial fibrillation: continue Lopressor 50 mg BID ?--home torsemide on hold ?--no AC due to small bowel AVMs ?9: Diabetes mellitus: CBGs qAC,qHS; monitor intake and activity ?--continue SSI ?--consider restarting home Glucotrol XL 10 mg ?10: Hypertension: increase lisinopril to 40 mg, amlodipine 10 mg, Lopressor. Repeat creatinine on Monday given increase in Lisinopril.  ?11: Hyperlipidemia: continue rosuvastatin 10 mg ?12: Moderate malnutrition: continue supplements ?13: Chronic kidney disease stage 3b: follow-up BMP ?14: Glaucoma: continue latanoprost drops ?15: Hypothyroidism: continue Synthroid ?16: Diarrhea/loose stools: monitor; getting loperamide 2 mg daily (BID prn) ?17: Bowel and bladder incontinence: start bowel and bladder program ?18. Daytime somnolence: [employer] and SW if therapy can be scheduled a little later in the day and if daughters can be present to help motivate her ?19. Bradycardia: decrease lopressor to  25mg   BID ? ?LOS: ?1 days ?A FACE TO FACE EVALUATION WAS PERFORMED ? ?Martha Clan P Kaislyn Gulas ?01/04/2022, 11:12 AM  ? ?  ?

## 2022-01-04 NOTE — Evaluation (Signed)
Physical Therapy Assessment and Plan ? ?Patient Details  ?Name: Patricia Dudley ?MRN: [id number] ?Date of Birth: 01-Oct-1929 ? ?PT Diagnosis: Abnormal posture, Difficulty walking, Hemiplegia non-dominant, Impaired cognition, and Muscle weakness ?Rehab Potential: Good ?ELOS: 3-4 weeks.  ? ?Today's Date: 01/04/2022 ?PT Individual Time: 5102-5852 ?PT Individual Time Calculation (min): 75 min   ? ?Hospital Problem: Principal Problem: ?  Acute embolic stroke [facility]) ? ? ?Past Medical History:  ?Past Medical History:  ?Diagnosis Date  ? A-fib ([place])   ? CVA (cerebral vascular accident) [facility])   ? Dementia ([place])   ? Diabetes mellitus without complication ([place])   ? GI bleed   ? Hyperlipemia   ? Hypertension   ? Thyroid disease   ? ?Past Surgical History:  ?Past Surgical History:  ?Procedure Laterality Date  ? COLONOSCOPY N/A 05/16/2017  ? Procedure: COLONOSCOPY;  Surgeon: Lin Landsman, MD;  Location: [facility] ENDOSCOPY;  Service: Gastroenterology;  Laterality: N/A;  ? ESOPHAGOGASTRODUODENOSCOPY N/A 08/08/2016  ? Procedure: ESOPHAGOGASTRODUODENOSCOPY (EGD);  Surgeon: [name], MD;  Location: [facility] ENDOSCOPY;  Service: Endoscopy;  Laterality: N/A;  ? ESOPHAGOGASTRODUODENOSCOPY N/A 05/16/2017  ? Procedure: ESOPHAGOGASTRODUODENOSCOPY (EGD);  Surgeon: Lin Landsman, MD;  Location: [facility] ENDOSCOPY;  Service: Gastroenterology;  Laterality: N/A;  ? ESOPHAGOGASTRODUODENOSCOPY (EGD) WITH PROPOFOL N/A 02/28/2017  ? Procedure: ESOPHAGOGASTRODUODENOSCOPY (EGD) WITH PROPOFOL;  Surgeon: [name], MD;  Location: [facility] ENDOSCOPY;  Service: Endoscopy;  Laterality: N/A;  ? EYE SURGERY    ? left  ? ? ?Assessment & Plan ?Clinical Impression: Patricia Dudley is an 88 year old female who presented to the emergency department on 12/24/2021 complaining of aphasia, left gaze and slurred speech.  Family reported vomiting prior to arrival.  She was outside the window for tenecteplase therapy.  Code stroke called by EMS.  Initial head CT scan  revealed extensive chronic ischemic changes throughout the brain.  Aspirin 81 mg initiated.  MRI of the brain showed small left basal ganglia/internal capsule infarct.  MRA were unremarkable.  Chest x-ray significant for mild airspace disease at the left base, favor atelectasis.  She was treated for presumptive aspiration pneumonia with Rocephin and azithromycin intravenously transition to oral Augmentin. ?SLP evaluation on 5/2 with evidence of mild aspiration risk.  Dysphagia 1 with thin liquids recommended.  Diet advanced to dysphagia 3/pur?ed meats with thin liquids on 5/4, but downgraded back to dysphagia 1 diet, crushed meds on 5/5. The patient requires inpatient medicine and rehabilitation evaluations and services for ongoing dysfunction secondary to left basal ganglia/internal capsule infarct. ?  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  Medical history is also significant for chronic kidney disease stage IIIb, hypothyroidism, diabetes mellitus, hypertension, glaucoma, hyperlipidemia  ? ?Patient currently requires total with mobility secondary to decreased coordination and decreased motor planning and decreased sitting balance, decreased postural control, and hemiplegia.  Prior to hospitalization, patient was supervision with mobility and lived with Family in a House home.  Home access is  Level entry. ? ?Patient will benefit from skilled PT intervention to maximize safe functional mobility, minimize fall risk, and decrease caregiver burden for planned discharge home with 24 hour assist.  Anticipate patient will benefit from follow up [facility] at discharge. ? ?PT - End of Session ?Activity Tolerance: Tolerates 10 - 20 min activity with multiple rests ?Endurance Deficit: Yes ?Endurance Deficit Description: Pt lethargic at initiation, somewhat improved w/  activity. ?PT Assessment ?  Rehab Potential (ACUTE/IP ONLY): Good ?PT Barriers to Discharge: Other (comments) ?PT Barriers to Discharge Comments: new CVA, non-verbal. ?PT Patient demonstrates impairments in the following area(s): Balance;Safety;Endurance;Motor ?PT Transfers Functional Problem(s): Bed Mobility;Bed to Chair;Car;Furniture ?PT Locomotion Functional Problem(s): Ambulation ?PT Plan ?PT Intensity: Minimum of 1-2 x/day ,45 to 90 minutes ?PT Frequency: 5 out of 7 days ?PT Duration Estimated Length of Stay: 3-4 weeks. ?PT Treatment/Interventions: Ambulation/gait training;Discharge planning;Functional mobility training;Therapeutic Activities;UE/LE Strength taining/ROM;Balance/vestibular training;Community reintegration;Neuromuscular re-education;Patient/family education;Therapeutic Exercise ?PT Transfers Anticipated Outcome(s): mod A ?PT Locomotion Anticipated Outcome(s): mod A w/ LRAD ?PT Recommendation ?Follow Up Recommendations: Home health PT ?Patient destination: Home ?Equipment Recommended: To be determined ? ? ?PT Evaluation ?Precautions/Restrictions ?Precautions ?Precautions: Fall ?Precaution Comments: aspiration precautions ?Restrictions ?Weight Bearing Restrictions: No ?General ?Chart Reviewed: Yes ?Family/Caregiver Present: No Vital SignsTherapy Vitals ?Temp: 97.8 ?F (36.6 ?C) ?Pulse Rate: 70 ?Resp: 14 ?BP: (!) 150/63 ?Patient Position (if appropriate): Lying ?Oxygen Therapy ?SpO2: 100 % ?O2 Device: Room Air ?Pain ?Pain Assessment ?Pain Scale: 0-10 ?Pain Score: 0-No pain ?Pain Interference ?Pain Interference ?Pain Effect on Sleep: 8. Unable to answer ?Pain Interference with Therapy Activities: 8. Unable to answer ?Pain Interference with Day-to-Day Activities: 8. Unable to answer ?Home Living/Prior Functioning ?Home Living ?Available Help at Discharge: Family ?Type of Home: House ?Home Access: Level entry ?Home Layout: One level ?Bathroom Shower/Tub: Walk-in shower ?Bathroom Toilet:  Standard ?Additional Comments: info recieved from acute notes per pt's daughter report, as family member not present during session and pt is non-verbal ? Lives With: Family ?Prior Function ?Level of Independence: Needs assistance with ADLs;Requires assistive device for independence (Per chart, pt walked alone w/ Rollator.) ?Bath: Minimal ?Dressing: Minimal ?Vision/Perception  ?Vision - Assessment ?Additional Comments: per acute notes- daughter reports hx of L eye "droop", pt able to read clock close up but reports blurry at distance ?Perception ?Perception: Not tested ?Praxis ?Praxis: Not tested  ?Cognition ?Overall Cognitive Status: Difficult to assess ?Arousal/Alertness: Lethargic (slumped to left in TIS, became more alert w/ activity, but still non-verbal.) ?Orientation Level: Other (comment) (eventually focused on therapist.) ?Attention: Focused ?Focused Attention: Impaired ?Focused Attention Impairment: Verbal basic ?Memory: Impaired ?Awareness: Impaired ?Awareness Impairment: Intellectual impairment ?Problem Solving: Impaired ?Problem Solving Impairment: Functional basic;Verbal basic ?Safety/Judgment: Impaired ?Comments: Pt was non-verbal and unable to follow commands without HOH assistance, no family member present ?Sensation ?Sensation ?Light Touch: Not tested ?Hot/Cold: Not tested ?Proprioception: Not tested ?Stereognosis: Not tested ?Coordination ?Gross Motor Movements are Fluid and Coordinated: No ?Fine Motor Movements are Fluid and Coordinated: No ?Coordination and Movement Description: generalized weakness, instability 2/2 poor motor planning and command following ?Heel Shin Test: NA ?Motor  ?Motor ?Motor: Other (comment) (pt unable to follow commands for testing.)  ? ?Trunk/Postural Assessment  ?Thoracic Assessment ?Thoracic Assessment: Exceptions to [facility] (kyphotic.) ?Postural Control ?Postural Control: Deficits on evaluation  ?Balance ?Balance ?Balance Assessed: Yes ?Static Sitting Balance ?Static  Sitting - Balance Support: Bilateral upper extremity supported;Feet supported (does maintain midline sitting w/o UE support but then reverts to support w/ UEs.) ?Static Sitting - Level of Assistance: 4: Min assist ?Static Standing Bal

## 2022-01-04 NOTE — Evaluation (Signed)
Occupational Therapy Assessment and Plan ? ?Patient Details  ?Name: Patricia Dudley ?MRN: [id number] ?Date of Birth: [birth year] ? ?OT Diagnosis: abnormal posture, cognitive deficits, disturbance of vision, hemiplegia affecting dominant side, and muscle weakness (generalized) ?Rehab Potential: Rehab Potential (ACUTE ONLY): Fair ?ELOS: 3-4 weeks  ? ?Today's Date: 01/04/2022 ?OT Individual Time: 1000-1056 ?OT Individual Time Calculation (min): 56 min    ? ?Hospital Problem: Principal Problem: ?  Acute embolic stroke [facility]) ? ? ?Past Medical History:  ?Past Medical History:  ?Diagnosis Date  ? A-fib ([place])   ? CVA (cerebral vascular accident) [facility])   ? Dementia ([place])   ? Diabetes mellitus without complication ([place])   ? GI bleed   ? Hyperlipemia   ? Hypertension   ? Thyroid disease   ? ?Past Surgical History:  ?Past Surgical History:  ?Procedure Laterality Date  ? COLONOSCOPY N/A 05/16/2017  ? Procedure: COLONOSCOPY;  Surgeon: Lin Landsman, MD;  Location: [facility] ENDOSCOPY;  Service: Gastroenterology;  Laterality: N/A;  ? ESOPHAGOGASTRODUODENOSCOPY N/A 08/08/2016  ? Procedure: ESOPHAGOGASTRODUODENOSCOPY (EGD);  Surgeon: [name], MD;  Location: [facility] ENDOSCOPY;  Service: Endoscopy;  Laterality: N/A;  ? ESOPHAGOGASTRODUODENOSCOPY N/A 05/16/2017  ? Procedure: ESOPHAGOGASTRODUODENOSCOPY (EGD);  Surgeon: Lin Landsman, MD;  Location[facility] ENDOSCOPY;  Service: Gastroenterology;  Laterality: N/A;  ? ESOPHAGOGASTRODUODENOSCOPY (EGD) WITH PROPOFOL N/A 02/28/2017  ? Procedure: ESOPHAGOGASTRODUODENOSCOPY (EGD) WITH PROPOFOL;  Surgeon: [name], MD;  Location: [facility] ENDOSCOPY;  Service: Endoscopy;  Laterality: N/A;  ? EYE SURGERY    ? left  ? ? ?Assessment & Plan ?Clinical Impression:  ? ?Patricia Dudley is an 88 year old female who presented to the emergency department on 12/24/2021 complaining of aphasia, left gaze and slurred speech.  Family reported vomiting prior to arrival.  She was outside the window for  tenecteplase therapy.  Code stroke called by EMS.  Initial head CT scan revealed extensive chronic ischemic changes throughout the brain.  Aspirin 81 mg initiated.  MRI of the brain showed small left basal ganglia/internal capsule infarct.  MRA were unremarkable.  Chest x-ray significant for mild airspace disease at the left base, favor atelectasis.  She was treated for presumptive aspiration pneumonia with Rocephin and azithromycin intravenously transition to oral Augmentin. ?SLP evaluation on 5/2 with evidence of mild aspiration risk.  Dysphagia 1 with thin liquids recommended.  Diet advanced to dysphagia 3/pur?ed meats with thin liquids on 5/4, but downgraded back to dysphagia 1 diet, crushed meds on 5/5. The patient requires inpatient medicine and rehabilitation evaluations and services for ongoing dysfunction secondary to left basal ganglia/internal capsule infarct. Patient transferred to CIR on 01/03/2022 .   ? ?Patient currently requires total with basic self-care skills secondary to muscle weakness, decreased cardiorespiratoy endurance, decreased coordination and decreased motor planning, decreased visual motor skills, decreased initiation, decreased awareness, decreased problem solving, decreased safety awareness, and decreased memory, and decreased sitting balance, decreased standing balance, and decreased postural control.  Prior to hospitalization, patient could complete functional mobility with mod I using rollator, and self-care tasks with min A with help received by pt's daughter specifically for dressing tasks. ? ?Patient will benefit from skilled intervention to decrease level of assist with basic self-care skills prior to discharge home with care partner.  Anticipate patient will require moderate physical assestance and follow up home health. ? ?OT - End of Session ?Activity Tolerance: Tolerates < 10 min activity, no significant change in vital signs ?Endurance Deficit: Yes ?Endurance Deficit  Description: required rest breaks in between functional transfers  with noticeable change in breathing rate ?OT Assessment ?Rehab Potential (ACUTE ONLY): Fair ?OT Barriers to Discharge: Incontinence;Home environment access/layout;Behavior ?OT Patient demonstrates impairments in the following area(s): Balance;Behavior;Cognition;Endurance;Motor;Pain;Perception;Safety;Skin Integrity ?OT Basic ADL's Functional Problem(s): Eating;Grooming;Bathing;Dressing;Toileting ?OT Transfers Functional Problem(s): Toilet ?OT Additional Impairment(s): Fuctional Use of Upper Extremity ?OT Plan ?OT Intensity: Minimum of 1-2 x/day, 45 to 90 minutes ?OT Frequency: 5 out of 7 days ?OT Duration/Estimated Length of Stay: 3-4 weeks ?OT Self Feeding Anticipated Outcome(s): Min A ?OT Basic Self-Care Anticipated Outcome(s): Mod A ?OT Toileting Anticipated Outcome(s): Mod A ?OT Bathroom Transfers Anticipated Outcome(s): Mod A ?OT Recommendation ?Patient destination: Home ?Follow Up Recommendations: Home health OT;24 hour supervision/assistance ?Equipment Recommended: To be determined ? ? ?OT Evaluation ?Precautions/Restrictions  ?Precautions ?Precautions: Fall ?Precaution Comments: aspiration precautions ?Restrictions ?Weight Bearing Restrictions: No ?Home Living/Prior Functioning ?Home Living ?Family/patient expects to be discharged to:: Private residence ?Living Arrangements: Children ?Available Help at Discharge: Family ?Type of Home: House ?Home Access: Level entry ?Home Layout: One level ?Bathroom Shower/Tub: Walk-in shower ?Bathroom Toilet: Standard ?Additional Comments: info recieved from acute notes per pt's daughter report, as family member not present during session and pt is non-verbal ? Lives With: Family ?Prior Function ?Level of Independence: Requires assistive device for independence, Needs assistance with ADLs (per chart review) ?Bath: Minimal ?Dressing: Minimal ?Vision ?Baseline Vision/History:  (unable to assess 2/2 no family  member present to assist with answering questions) ?Vision Assessment?: Vision impaired- to be further tested in functional context ?Additional Comments: per acute notes- daughter reports hx of L eye "droop", pt able to read clock close up but reports blurry at distance ?Perception  ?Perception: Not tested ?Praxis ?Praxis: Not tested ?Cognition ?Cognition ?Overall Cognitive Status: Impaired/Different from baseline ?Arousal/Alertness: Lethargic ?Orientation Level: Nonverbal/unable to assess ?Memory: Impaired ?Awareness: Impaired ?Problem Solving: Impaired ?Safety/Judgment: Impaired ?Comments: Pt was non-verbal, no family member present ?Brief Interview for Mental Status (BIMS) ?Repetition of Three Words (First Attempt): No answer (non-verbal, unable to respond) ?Temporal Orientation: Year: No answer ?Temporal Orientation: Month: No answer ?Temporal Orientation: Day: No answer ?Recall: "Sock": No answer ?Recall: "Blue": No answer ?Recall: "Bed": No answer ?BIMS Summary Score: 99 ?Sensation ?Sensation ?Light Touch: Not tested ?Hot/Cold: Not tested ?Proprioception: Not tested ?Stereognosis: Not tested ?Coordination ?Gross Motor Movements are Fluid and Coordinated: No ?Fine Motor Movements are Fluid and Coordinated: Not tested ?Coordination and Movement Description: generalized weakness, instability 2/2 poor motor planning and command following ?Motor  ?Motor ?Motor: Other (comment) (pt unable to follow commands for testing.)  ?Trunk/Postural Assessment  ?Thoracic Assessment ?Thoracic Assessment: Exceptions to [facility] (kyphotic.) ?Postural Control ?Postural Control: Deficits on evaluation  ?Balance ?Balance ?Balance Assessed: Yes ?Static Sitting Balance ?Static Sitting - Balance Support: Bilateral upper extremity supported;Feet supported (does maintain midline sitting w/o UE support but then reverts to support w/ UEs.) ?Static Sitting - Level of Assistance: 4: Min assist ?Static Standing Balance ?Static Standing - Balance  Support: Bilateral upper extremity supported ?Static Standing - Level of Assistance: 2: Max assist ?Extremity/Trunk Assessment ?RUE Assessment ?RUE Assessment: Exceptions to [facility] ?Active Range of Motion (AROM) Comments:

## 2022-01-04 NOTE — Progress Notes (Signed)
Inpatient Rehabilitation  Patient information reviewed and entered into eRehab system by Taygan Connell Gina Leblond, OTR/L.   Information including medical coding, functional ability and quality indicators will be reviewed and updated through discharge.    

## 2022-01-05 LAB — GLUCOSE, CAPILLARY
Glucose-Capillary: 188 mg/dL — ABNORMAL HIGH (ref 70–99)
Glucose-Capillary: 240 mg/dL — ABNORMAL HIGH (ref 70–99)
Glucose-Capillary: 255 mg/dL — ABNORMAL HIGH (ref 70–99)
Glucose-Capillary: 189 mg/dL — ABNORMAL HIGH (ref 70–99)

## 2022-01-05 MED ORDER — LISINOPRIL 20 MG PO TABS: 20.0000 mg | ORAL_TABLET | Freq: Every day | ORAL | Status: DC

## 2022-01-05 NOTE — Progress Notes (Signed)
Physical Therapy Session Note ? ?Patient Details  ?Name: Patricia Dudley ?MRN: [id number] ?Date of Birth: [birth year] ? ?Today's Date: 01/05/2022 ?PT Individual Time: 4163-8453 ?PT Individual Time Calculation (min): 75 min  ? ?Short Term Goals: ?Week 1:  PT Short Term Goal 1 (Week 1): Pt will roll side to side w/ max A ?PT Short Term Goal 2 (Week 1): Pt will transfer sup to sit w/ max A ?PT Short Term Goal 3 (Week 1): Pt will transfer sit to stand w/ max A ?PT Short Term Goal 4 (Week 1): Pt will amb w/ LRAD x 10'. ?Week 2:    ?Week 3:    ? ?Skilled Therapeutic Interventions/Progress Updates:  ?  Pt initially oob in wc, awake, alert, smiles at therapist. ?Family in room, reports pt refused oral care. ?Therapist gently suctioned mouth, pt w/foot pocketing in R cheek. ? ?Pt transported to gym. ?Gait x 74ft w/+2 HHA, shuffling gait, flexed posutre, decreased clearance RLE>LLE which increases w/fatigue, L knee instability also increases w/fatigue. Continuous manual cueing to facilitate forward progression. ? ?Armchair to wc squatpivot transfer - pt initiated task w/gestures by therapist but unable to proceed after initial positioning and attempted boost to standing.  Transfer overall mod assist. ? ?In apartment session, focusing on functional activities: ?Sit to stand and turn to face counter w/mod to max assist, max cues to intitiate. ?In standing, pt worked on maintaining standing w/hands on counter while returning "spilled" fruit to fruit bowl.  Hand over hand assist to initiate, cues to scan R and transfer fruit to bowl on R. ? ?Repeated gait as above x 45ft. ? ?Again transfers to standing at counter and engaged in dual UE task of stacking cups, max facilitation/hand over hand to intitiate then pt proceeds w/stacking single cone after several cues.  Repeats task w/6 cups, therapist blocks R knee in standing, overall min assist to mod assist w/static stand, max facilitation for task. ? ?Pt w/increasingly fatigued  appearance thru session.  Pt transported to room.  Therapist initiated [place] transfer training w/Pt.  Pt required max cueing/tactile facilitation to initiate sit to stand/completes w/mod assist in stedy.  Pt then repeates perched stand to stand in stedy several time spontaneously w/min assist. ?Stedy transfer to edge of bed.  Max cues as above to initiate standing, stand to sit w/overall mod assist. ?Sit to supine max cues and mod assist. ?Pt left supine w/rails up x 3, alarm set, bed in lowest position, and needs in reach.  NT in room for vitals. ? ? ? ?Therapy Documentation ?Precautions:  ?Precautions ?Precautions: Fall ?Precaution Comments: aspiration precautions ?Restrictions ?Weight Bearing Restrictions: No ? ? ?Therapy/Group: Individual Therapy ?Callie Fielding, PT ? ? ?Patricia Dudley ?01/05/2022, 1:58 PM  ?

## 2022-01-05 NOTE — Progress Notes (Signed)
Occupational Therapy Session Note ? ?Patient Details  ?Name: Patricia Dudley ?MRN: [id number] ?Date of Birth: Sep 04, 1929 ? ?Today's Date: 01/05/2022 ?OT Individual Time: 4782-9562 ?OT Individual Time Calculation (min): 60 min  ? ? ?Short Term Goals: ?Week 1:  OT Short Term Goal 1 (Week 1): Pt will visually attend to R side of body with min A during functional task ?OT Short Term Goal 2 (Week 1): Pt will follow 1 step command in prep for ADL task, with use of multimodal cues and no more than min A ?OT Short Term Goal 3 (Week 1): Pt will roll either R or L during bed level toileting, with mod A and use of multimodal cues as needed ? ?Skilled Therapeutic Interventions/Progress Updates:  ?  1:1 Pt received in bed; pt slow to arouse. Pt remained non-verbal the whole session except for answering yes to "can you hear me?" After putting in her hearing aids. Pants donned in supine with pt making an attempt to bridge. Facilitated pt rolling on to her right side and then she began to initiate coming into sitting on EOB but still required max A to complete. Pt was able to sit EOB with supervision while engaging in donning her shirt. At one point pt did fall backwards requiring total A to regain balance- due to decr attention to task. Pt required HOH to initiate steps to donning shirt; the only step she completed was pulling the shirt down over her head once it was on her head. Pt performed stand pivot transfers with extra time bed<w/c<>toilet (with BSC over the top). Pt initiated helping more in the bathroom and was able to void on the toilet with extra time. Pt did initiated ocming into standing and did perform periarea with mod A for balance. Transferred to w/c with mod A +2 with more than reasonable amt of time. In the hallway pt walked ~ 15 feet and then turned and went back ~15 to the chair with bilateral Ue HHA and mod A for facilitation for upright posture. Performed UE arm bike to elicit reciprocal movement - required  cue for each bilateral Ue rotation. ? ?Pt withholding breakfast in mouth- requiring total  Awith suction to remove. ? ?Pt left sitting in TIS w/c tilted with safety measures in place.  ? ?Therapy Documentation ?Precautions:  ?Precautions ?Precautions: Fall ?Precaution Comments: aspiration precautions ?Restrictions ?Weight Bearing Restrictions: No ? ?Pain: ? No indications for pain  ? ? ? ?Therapy/Group: Individual Therapy ? ?Nicoletta Ba ?01/05/2022, 12:41 PM ?

## 2022-01-05 NOTE — Progress Notes (Signed)
Speech Language Pathology Daily Session Note ? ?Patient Details  ?Name: Patricia Dudley ?MRN: [id number] ?Date of Birth: 1929-09-17 ? ?Today's Date: 01/05/2022 ?SLP Individual Time: 6415-8309 ?SLP Individual Time Calculation (min): 54 min ? ?Short Term Goals: ?Week 1: SLP Short Term Goal 1 (Week 1): Pt will follow 3 commands within a functional context given Max multimodal A. ?SLP Short Term Goal 2 (Week 1): Pt will participate in CSE to determine safety diet consistencies with 100% completion. ?SLP Short Term Goal 3 (Week 1): Pt will communicate wants, needs, ideas, and thoughts x 1 given Total A for use of mulitmodal communication methods. ?SLP Short Term Goal 4 (Week 1): Pt will maintain focused attention for 2 minutes with Mod A. ?SLP Short Term Goal 5 (Week 1): Pt will participate in ongoing cognitive-linguistic evlauation to further determine ST POC and aid in dc planning. ? ?Skilled Therapeutic Interventions: Skilled treatment session focused on cognitive goals. Upon arrival, patient was awake in the wheelchair with her eyes open. SLP attempted to provide oral care via the suction toothbrush but patient unable to open her oral cavity despite Max A multimodal cues. SLP also utilized hand over hand for patient to hold a utensil or cup for initiation of self-feeding. Despite attempts with both upper extremities, patient unable to perform task. SLP then attempted to total A feed the patient a snack of Dys. 1 textures with thin liquids to perform a BSE, however, patient actively resisting and squeezing her oral cavity shut. Patient did open her oral cavity slightly X 1 but unable to intake a sufficient amount of the bolus.  BSE will be deferred at this time due to patient unable to participate, suspect due to fatigue. SLP also brought the patient to the sink to engage in basic self-care tasks with total A needed to initiate and sequence washing her hands and face. Patient remained nonverbal throughout session  without attempts to utilize gestures of facial expressions to communicate despite Max A multimodal cues. Patient left reclined in tilt-in-space wheelchair with alarm on and all needs within reach. Continue with current plan of care.  ?   ? ?Pain ?No indications of pain  ? ?Therapy/Group: Individual Therapy ? ?Lailoni Baquera ?01/05/2022, 3:29 PM ?

## 2022-01-05 NOTE — Progress Notes (Signed)
?                                                       PROGRESS NOTE ? ? ?Subjective/Complaints: ? ?Patient is not following commands this morning.  Has eyes open and is awake and able to maintain wakefulness.  Left eye slightly closed consistent with yesterday. ? ?ROS: limited by language, unable to answer commands. ? ?Objective: ?  ?No results found. ?No results for input(s): WBC, HGB, HCT, PLT in the last 72 hours. ?Recent Labs  ?  01/04/22 ?3845  ?NA 142  ?K 4.7  ?CL 110  ?CO2 26  ?GLUCOSE 214*  ?BUN 28*  ?CREATININE 2.28*  ?CALCIUM 8.9  ? ? ?Intake/Output Summary (Last 24 hours) at 01/05/2022 1531 ?Last data filed at 01/05/2022 1300 ?Gross per 24 hour  ?Intake 160 ml  ?Output --  ?Net 160 ml  ?  ? ?  ? ?Physical Exam: ?Vital Signs ?Blood pressure 122/60, pulse 95, temperature (!) 96.7 ?F (35.9 ?C), temperature source Axillary, resp. rate 18, height 5\' 5"  (1.651 m), weight 55.8 kg, SpO2 100 %. ? ?Gen: no distress, normal appearing ?HENT: oral mucosa pink and moist, NCAT ?Eyes:  ?   General:     ?   Right eye: No discharge.     ?   Left eye: No discharge.  ?   Comments: Left lid closed ?R eye lid more open  ?Cardiovascular:  ?   Rate and Rhythm: Normal with previous bradycardia resolved ?   Heart sounds: Normal heart sounds. No murmur heard. ?  No gallop.  ?Pulmonary:  ?   Effort: Pulmonary effort is normal. No respiratory distress.  ?   Breath sounds: Normal breath sounds. No wheezing or rales.  ?Abdominal:  ?   General: Bowel sounds are normal. There is no distension.  ?   Palpations: Abdomen is soft.  ?   Tenderness: There is no abdominal tenderness.  ?Musculoskeletal:  ?   Cervical back: Neck supple.  ?   Comments: Limited participation in movement today, squeeze hand: L grip/biceps 5-/5 BUE: 3/5 grip/biceps.  Would not participate in RLE and LLE movement. ?Skin: ?   General: Skin is warm and dry.  ?Neurological:  ?   Mental Status: She is alert.  ?   Comments: Squeezes hands on command, does not protrude  tongue when shown/asked ?No verbal expression shaking head yes or no-  ?Was able to remain awake during exam today. ?Psychiatric:  ?   Comments: Flat- nonverbal  ? ?Assessment/Plan: ?1. Functional deficits which require 3+ hours per day of interdisciplinary therapy in a comprehensive inpatient rehab setting. ?Physiatrist is providing close team supervision and 24 hour management of active medical problems listed below. ?Physiatrist and rehab team continue to assess barriers to discharge/monitor patient progress toward functional and medical goals ? ?Care Tool: ? ?Bathing ?   ?   ? Body parts bathed by helper: Right arm, Left arm, Chest, Abdomen, Front perineal area, Buttocks, Right upper leg, Left upper leg, Right lower leg, Left lower leg, Face ?Body parts n/a: Front perineal area, Buttocks ?  ?Bathing assist Assist Level: Dependent - Patient 0% ?  ?  ?Upper Body Dressing/Undressing ?Upper body dressing   ?What is the patient wearing?: Green only ?   ?Upper body assist Assist Level:  Total Assistance - Patient < 25% ?   ?Lower Body Dressing/Undressing ?Lower body dressing ? ? ?   ?What is the patient wearing?: Incontinence brief ? ?  ? ?Lower body assist Assist for lower body dressing: 2 Helpers (bed level) ?   ? ?Toileting ?Toileting    ?Toileting assist Assist for toileting: 2 Helpers ?  ?  ?Transfers ?Chair/bed transfer ? ?Transfers assist ?   ? ?Chair/bed transfer assist level: Maximal Assistance - Patient 25 - 49% ?  ?  ?Locomotion ?Ambulation ? ? ?Ambulation assist ? ? Ambulation activity did not occur: Safety/medical concerns ? ?  ?  ?   ? ?Walk 10 feet activity ? ? ?Assist ? Walk 10 feet activity did not occur: Safety/medical concerns ? ?  ?   ? ?Walk 50 feet activity ? ? ?Assist Walk 50 feet with 2 turns activity did not occur: Safety/medical concerns ? ?  ?   ? ? ?Walk 150 feet activity ? ? ?Assist Walk 150 feet activity did not occur: Safety/medical concerns ? ?  ?  ?  ? ?Walk 10 feet on uneven  surface  ?activity ? ? ?Assist Walk 10 feet on uneven surfaces activity did not occur: Safety/medical concerns ? ? ?  ?   ? ?Wheelchair ? ? ? ? ?Assist Is the patient using a wheelchair?: No ?  ?Wheelchair activity did not occur: Safety/medical concerns ? ?  ?   ? ? ?Wheelchair 50 feet with 2 turns activity ? ? ? ?Assist ? ?  ?Wheelchair 50 feet with 2 turns activity did not occur: Safety/medical concerns ? ? ?   ? ?Wheelchair 150 feet activity  ? ? ? ?Assist ? Wheelchair 150 feet activity did not occur: Safety/medical concerns ? ? ?   ? ?Blood pressure 122/60, pulse 95, temperature (!) 96.7 ?F (35.9 ?C), temperature source Axillary, resp. rate 18, height 5\' 5"  (1.651 m), weight 55.8 kg, SpO2 100 %. ? ? ? Medical Problem List and Plan: ?1. 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 ?            -patient may  shower ?            -ELOS/Goals: ~ 3 weeks- min A ? -Continue CIR ?2.  Antithrombotics: ?-DVT/anticoagulation: Heparin [place] TID  ?            -antiplatelet therapy: aspirin 81 mg daily ?3. Pain Management: Tylenol prn ?4. Mood: LCSW to evaluate and provide emotional support ?            -antipsychotic agents: n/a ?5. Neuropsych: This patient is not capable of making decisions on her own behalf. ?6. Skin/Wound Care: Routine skin care checks ?7. Fluids/Electrolytes/Nutrition: Routine Is and Os and follow-up chemistries ?            -- Dysphagia 1 diet with thin liquids.  Crushed meds with applesauce.  SLP evaluation. ?            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 ? ?  01/05/2022  ?  2:17 PM 01/05/2022  ?  4:30 AM 01/04/2022  ?  7:38 PM  ?Vitals with BMI  ?Systolic 431 540 086  ?Diastolic 60 92 74  ?Pulse 95 79 79  ?  ?9: Diabetes mellitus: CBGs qAC,qHS; monitor intake and activity ?--continue  SSI ?--consider restarting home Glucotrol XL 10 mg ?10: Hypertension: increase lisinopril to 40 mg, amlodipine 10 mg, Lopressor. Repeat creatinine on Monday given increase in Lisinopril.  ?5/13 BP controlled today. ?11: Hyperlipidemia: continue rosuvastatin 10 mg ?12: Moderate malnutrition: continue supplements ?13: Chronic kidney disease stage 3b: follow-up BMP ?    -5/13 Pt had lisinopril increased to 40 mg yesterday which may increase Crt. B/P is in the 120's today.  Try to decrease lisinopril to 20 mg daily, push po intake and repeat BMP. ? ? ?  Latest Ref Rng & Units 01/04/2022  ?  5:58 AM 12/29/2021  ?  1:00 AM 12/27/2021  ?  2:38 AM  ?BMP  ?Glucose 70 - 99 mg/dL 214   122   144    ?BUN 8 - 23 mg/dL 28   15   14     ?Creatinine 0.44 - 1.00 mg/dL 2.28   1.76   1.92    ?Sodium 135 - 145 mmol/L 142   140   143    ?Potassium 3.5 - 5.1 mmol/L 4.7   3.8   3.6    ?Chloride 98 - 111 mmol/L 110   109   113    ?CO2 22 - 32 mmol/L 26   26   23     ?Calcium 8.9 - 10.3 mg/dL 8.9   8.6   8.2    ?  ?14: Glaucoma: continue latanoprost drops ?15: Hypothyroidism: continue Synthroid ?16: Diarrhea/loose stools: monitor; getting loperamide 2 mg daily (BID prn) ?-5/13 Continue current regimen. ?17: Bowel and bladder incontinence: start bowel and bladder program ?18. Daytime somnolence: [employer] and SW if therapy can be scheduled a little later in the day and if daughters can be present to help motivate her ?19. Bradycardia: decrease lopressor to 25mg  BID ?5/13 HR 79-95, WNL. ? ?LOS: ?2 days ?A FACE TO FACE EVALUATION WAS PERFORMED ? ?Luetta Nutting ?01/05/2022, 3:31 PM  ? ?  ?

## 2022-01-06 LAB — BASIC METABOLIC PANEL
Anion gap: 9 (ref 5–15)
BUN: 68 mg/dL — ABNORMAL HIGH (ref 8–23)
CO2: 25 mmol/L (ref 22–32)
Calcium: 8.1 mg/dL — ABNORMAL LOW (ref 8.9–10.3)
Chloride: 110 mmol/L (ref 98–111)
Creatinine, Ser: 3.2 mg/dL — ABNORMAL HIGH (ref 0.44–1.00)
GFR, Estimated: 13 mL/min — ABNORMAL LOW (ref >60)
Glucose, Bld: 191 mg/dL — ABNORMAL HIGH (ref 70–99)
Potassium: 4.7 mmol/L (ref 3.5–5.1)
Sodium: 144 mmol/L (ref 135–145)

## 2022-01-06 LAB — GLUCOSE, CAPILLARY
Glucose-Capillary: 159 mg/dL — ABNORMAL HIGH (ref 70–99)
Glucose-Capillary: 259 mg/dL — ABNORMAL HIGH (ref 70–99)
Glucose-Capillary: 277 mg/dL — ABNORMAL HIGH (ref 70–99)
Glucose-Capillary: 301 mg/dL — ABNORMAL HIGH (ref 70–99)

## 2022-01-06 MED ORDER — SODIUM CHLORIDE 0.45 % IV SOLN
INTRAVENOUS | Status: DC
  Filled 2022-01-06 (×2): qty 1000

## 2022-01-06 MED ORDER — INSULIN ASPART 100 UNIT/ML IJ SOLN
0.0000 [IU] | Freq: Three times a day (TID) | INTRAMUSCULAR | Status: DC
  Administered 2022-01-06: 7 [IU] via SUBCUTANEOUS
  Administered 2022-01-07: 9 [IU] via SUBCUTANEOUS
  Administered 2022-01-07: 2 [IU] via SUBCUTANEOUS
  Administered 2022-01-07: 7 [IU] via SUBCUTANEOUS
  Administered 2022-01-07: 5 [IU] via SUBCUTANEOUS
  Administered 2022-01-08: 7 [IU] via SUBCUTANEOUS
  Administered 2022-01-08: 3 [IU] via SUBCUTANEOUS

## 2022-01-06 MED ORDER — INSULIN ASPART 100 UNIT/ML IJ SOLN: 0.0000 [IU] | Freq: Three times a day (TID) | INTRAMUSCULAR | Status: DC

## 2022-01-06 MED ORDER — CARVEDILOL 6.25 MG PO TABS
6.2500 mg | ORAL_TABLET | Freq: Two times a day (BID) | ORAL | Status: DC
  Filled 2022-01-06: qty 1

## 2022-01-06 MED ORDER — SODIUM CHLORIDE 0.45 % IV BOLUS: 500.0000 mL | Freq: Once | INTRAVENOUS | Status: DC

## 2022-01-06 MED ORDER — CARVEDILOL 3.125 MG PO TABS: 3.1250 mg | ORAL_TABLET | Freq: Two times a day (BID) | ORAL | Status: DC

## 2022-01-06 MED ORDER — CAMPHOR-MENTHOL 0.5-0.5 % EX LOTN
TOPICAL_LOTION | CUTANEOUS | Status: DC | PRN
  Filled 2022-01-06: qty 222

## 2022-01-06 MED ORDER — SODIUM CHLORIDE 0.9 % IV BOLUS
1000.0000 mL | Freq: Once | INTRAVENOUS | Status: AC
  Administered 2022-01-06: 1000 mL via INTRAVENOUS

## 2022-01-06 NOTE — IPOC Note (Signed)
Overall Plan of Care (IPOC) ?Patient Details ?Name: Patricia Dudley ?MRN: [id number] ?DOB: 09/19/1929 ? ?Admitting Diagnosis: Acute embolic stroke ([place]) ? ?Hospital Problems: Principal Problem: ?  Acute embolic stroke [facility]) ? ? ? ? Functional Problem List: ?Nursing Bladder, Bowel, Edema, Pain, Nutrition, Endurance, Medication Management, Safety  ?PT Balance, Safety, Endurance, Motor  ?OT Balance, Behavior, Cognition, Endurance, Motor, Pain, Perception, Safety, Skin Integrity  ?SLP Cognition, Endurance, Linguistic, Motor, Nutrition  ?TR    ?    ? Basic ADL?s: ?OT Eating, Grooming, Bathing, Dressing, Toileting  ? ?  Advanced  ADL?s: ?OT    ?   ?Transfers: ?PT Bed Mobility, Bed to Chair, Car, Furniture  ?OT Toilet  ? ?  Locomotion: ?PT Ambulation  ? ?  Additional Impairments: ?OT Fuctional Use of Upper Extremity  ?SLP Swallowing, Communication ?comprehension, expression ?   ?TR    ? ? ?Anticipated Outcomes ?Item Anticipated Outcome  ?Self Feeding Min A  ?Swallowing ? Max A ?  ?Basic self-care ? Mod A  ?Toileting ? Mod A ?  ?Bathroom Transfers Mod A  ?Bowel/Bladder ? manage bowel and bladder wtih min assist  ?Transfers ? mod A  ?Locomotion ? mod A w/ LRAD  ?Communication ? Total A  ?Cognition ? Total A  ?Pain ? pain at or below level 4 with prns  ?Safety/Judgment ? maintain safety w cues  ? ?Therapy Plan: ?PT Intensity: Minimum of 1-2 x/day ,45 to 90 minutes ?PT Frequency: 5 out of 7 days ?PT Duration Estimated Length of Stay: 3-4 weeks. ?OT Intensity: Minimum of 1-2 x/day, 45 to 90 minutes ?OT Frequency: 5 out of 7 days ?OT Duration/Estimated Length of Stay: 3-4 weeks ?SLP Intensity: Minumum of 1-2 x/day, 30 to 90 minutes ?SLP Frequency: 3 to 5 out of 7 days ?SLP Duration/Estimated Length of Stay: 3-4 weeks ? ? Team Interventions: ?Nursing Interventions Patient/Family Education, Bladder Management, Bowel Management, Disease Management/Prevention, Pain Management, Medication Management, Cognitive  Remediation/Compensation, Dysphagia/Aspiration Precaution Training, Discharge Planning  ?PT interventions Ambulation/gait training, Discharge planning, Functional mobility training, Therapeutic Activities, UE/LE Strength taining/ROM, Training and development officer, Community reintegration, Neuromuscular re-education, Patient/family education, Therapeutic Exercise  ?OT Interventions    ?SLP Interventions Speech/Language facilitation, Patient/family education, Multimodal communication approach, Therapeutic Activities  ?TR Interventions    ?SW/CM Interventions Discharge Planning, Psychosocial Support, Patient/Family Education  ? ?Barriers to Discharge ?MD  Medical stability, Home enviroment access/loayout, Medication compliance, and Nutritional means  ?Nursing Decreased caregiver support, Incontinence, Nutrition means ?1 level, level entry w daughter  ?PT Other (comments) ?new CVA, non-verbal.  ?OT Incontinence, Home environment access/layout, Behavior ?   ?SLP   ?   ?SW Insurance for SNF coverage, Other (comments) ?Pt may be too much care for children as they may not be physically able to assist her  ? ?Team Discharge Planning: ?Destination: PT-Home ,OT- Home , SLP-Home ?Projected Follow-up: PT-Home health PT, OT-  Home health OT, 24 hour supervision/assistance, SLP-24 hour supervision/assistance ?Projected Equipment Needs: PT-To be determined, OT- To be determined, SLP-To be determined ?Equipment Details: PT- , OT-  ?Patient/family involved in discharge planning: PT- Patient,  OT-Patient unable/family or caregiver not available, SLP-Patient unable/family or caregive not available ? 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 The team will be addressing functional mobility, strength, stamina, balance, safety, adaptive techniques and equipment, self-care,  bowel and bladder mgt, patient and caregiver  education. Goals have been set at PT/OT supervision to min assist, SLP min assist. Anticipated discharge destination is home. ? ? ?  ? ?See Team Conference Notes for weekly updates to the plan of care ? ?

## 2022-01-06 NOTE — Progress Notes (Signed)
Attempted to push oral fluids, patient was refusing to drink throughout the night. May have drank 100 ml of fluid.  ?

## 2022-01-06 NOTE — Progress Notes (Signed)
PROGRESS NOTE   Subjective/Complaints:  Patient is following a few limited commands today.  Has eyes open and is awake and able to maintain wakefulness.  Left eye slightly closed consistent with yesterday.  ROS: limited by language, unable to answer commands.  Objective:   No results found. No results for input(s): WBC, HGB, HCT, PLT in the last 72 hours. Recent Labs    01/04/22 0558 01/06/22 0553  NA 142 144  K 4.7 4.7  CL 110 110  CO2 26 25  GLUCOSE 214* 191*  BUN 28* 68*  CREATININE 2.28* 3.20*  CALCIUM 8.9 8.1*    Intake/Output Summary (Last 24 hours) at 01/06/2022 1539 Last data filed at 01/06/2022 0900 Gross per 24 hour  Intake 240 ml  Output --  Net 240 ml        Physical Exam: Vital Signs Blood pressure (!) 132/50, pulse 70, temperature 98.3 F (36.8 C), temperature source Oral, resp. rate 18, height  (1.651 m), weight 55.8 kg, SpO2 96 %.  Gen: no distress, normal appearing HENT: oral mucosa pink and moist, NCAT Eyes:     General:        Right eye: No discharge.        Left eye: No discharge.     Comments: Left lid closed R eye lid more open  Cardiovascular:     Rate and Rhythm: irregular, a fib, had episodes of bradycardia    Heart sounds: Normal heart sounds. No murmur heard.   No gallop.  Pulmonary:     Effort: Pulmonary effort is normal. No respiratory distress.     Breath sounds: Normal breath sounds. No wheezing or rales.  Abdominal:     General: Bowel sounds are normal. There is no distension.     Palpations: Abdomen is soft.     Tenderness: There is no abdominal tenderness.  Musculoskeletal:     Cervical back: Neck supple.     Comments: Limited participation in movement today, squeeze hand: L grip/biceps 5-/5 BUE: 3/5 grip/biceps.  Would not participate in RLE and LLE movement. Skin:    General: Skin is warm and dry.  Neurological:     Mental Status: She is alert.      Comments: Squeezes hands on command, did protrude tongue on command today, made attempt to smile when asked. No verbal expression shaking head yes or no-  Was able to remain awake and alert during exam today. Psychiatric:     Comments: Flat- nonverbal   Assessment/Plan: 1. Functional deficits which require 3+ hours per day of interdisciplinary therapy in a comprehensive inpatient rehab setting. Physiatrist is providing close team supervision and 24 hour management of active medical problems listed below. Physiatrist and rehab team continue to assess barriers to discharge/monitor patient progress toward functional and medical goals  Care Tool:  Bathing        Body parts bathed by helper: Right arm, Left arm, Chest, Abdomen, Front perineal area, Buttocks, Right upper leg, Left upper leg, Right lower leg, Left lower leg, Face Body parts n/a: Front perineal area, Buttocks   Bathing assist Assist Level: Dependent - Patient 0%     Upper Body Dressing/Undressing  Upper body dressing   What is the patient wearing?: Hospital gown only    Upper body assist Assist Level: Total Assistance - Patient < 25%    Lower Body Dressing/Undressing Lower body dressing      What is the patient wearing?: Incontinence brief     Lower body assist Assist for lower body dressing: 2 Helpers     Toileting Toileting    Toileting assist Assist for toileting: 2 Helpers     Transfers Chair/bed transfer  Transfers assist     Chair/bed transfer assist level: Maximal Assistance - Patient 25 - 49%     Locomotion Ambulation   Ambulation assist   Ambulation activity did not occur: Safety/medical concerns          Walk 10 feet activity   Assist  Walk 10 feet activity did not occur: Safety/medical concerns        Walk 50 feet activity   Assist Walk 50 feet with 2 turns activity did not occur: Safety/medical concerns         Walk 150 feet activity   Assist Walk 150 feet  activity did not occur: Safety/medical concerns         Walk 10 feet on uneven surface  activity   Assist Walk 10 feet on uneven surfaces activity did not occur: Safety/medical concerns         Wheelchair     Assist Is the patient using a wheelchair?: No   Wheelchair activity did not occur: Safety/medical concerns         Wheelchair 50 feet with 2 turns activity    Assist    Wheelchair 50 feet with 2 turns activity did not occur: Safety/medical concerns       Wheelchair 150 feet activity     Assist  Wheelchair 150 feet activity did not occur: Safety/medical concerns       Blood pressure (!) 132/50, pulse 70, temperature 98.3 F (36.8 C), temperature source Oral, resp. rate 18, height 5\' 5"  (1.651 m), weight 55.8 kg, SpO2 96 %.    Medical Problem List and Plan: 1. 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             -patient may  shower             -ELOS/Goals: ~ 3 weeks- min A  -Continue CIR 2.  Antithrombotics: -DVT/anticoagulation: Heparin [place] TID              -antiplatelet therapy: aspirin 81 mg daily 3. Pain Management: Tylenol prn 4. Mood: LCSW to evaluate and provide emotional support             -antipsychotic agents: n/a 5. Neuropsych: This patient is not capable of making decisions on her own behalf. 6. Skin/Wound Care: Routine skin care checks 7. Fluids/Electrolytes/Nutrition: Routine Is and Os and follow-up chemistries             -- Dysphagia 1 diet with thin liquids.  Crushed meds with applesauce.  SLP evaluation.             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    01/06/2022    9:00 AM 01/06/2022    6:02 AM 01/06/2022    2:51 AM  Vitals with BMI  Systolic 132  149   Diastolic 50  57  Pulse 70 68 46    9: Diabetes mellitus: CBGs qAC,qHS; monitor intake and activity --continue SSI --consider restarting home Glucotrol XL 10 mg 10: Hypertension: increase lisinopril to 40 mg, amlodipine 10 mg, Lopressor. Repeat creatinine on Monday given increase in Lisinopril.  5/13 BP controlled today. 5/14 some variability in B/P, with mild elevation, some episodes of bradycardia (HR 46 at 051 today). -D/c lisinopril today since BUN 28->68 and Crt 2.28->3.20, with increase in past 2 days.  Hold lopressor with bradycardia episodes.  Will see how B/P trends on amlodipine 10 mg, may consider adding hydralazine if B/p is elevated.   -Fluid bolus (1L NS) and continuous fluid (0.45 NS at 75 ml/hr) given for rise in BUN and Crt, trend B/p with IVF.      01/06/2022    9:00 AM 01/06/2022    6:02 AM 01/06/2022    2:51 AM  Vitals with BMI  Systolic 132  149  Diastolic 50  57  Pulse 70 68 46    11: Hyperlipidemia: continue rosuvastatin 10 mg 12: Moderate malnutrition: continue supplements 13: Chronic kidney disease stage 3b: follow-up BMP     -5/13 B/P is in the 120's today. Pt had lisinopril increased to 40 mg yesterday for elevated B/P.  Since lisinopril may increase Crt, decrease lisinopril to 20 mg daily today and push po intake and repeat BMP. -5/14 Crt up to 3.2 and BUN up to 68.  Fluid bolus (1L NS) and continuous fluid (0.45 NS at 75 ml/hr) given for rise in BUN and Crt. -d/c lisinopril (per Dr. Lanier Clam) since could be a possible cause of elevated Crt . -Obtain repeat BMP 5 am on 01/07/22.     Latest Ref Rng & Units 01/06/2022    5:53 AM 01/04/2022    5:58 AM 12/29/2021    1:00 AM  BMP  Glucose 70 - 99 mg/dL 409   811   914    BUN 8 - 23 mg/dL 68   28   15    Creatinine 0.44 - 1.00 mg/dL 7.82   9.56   2.13    Sodium 135 - 145 mmol/L 144   142   140    Potassium 3.5 - 5.1 mmol/L 4.7   4.7   3.8    Chloride 98 - 111 mmol/L 110   110   109    CO2 22 - 32 mmol/L Calcium 8.9 - 10.3 mg/dL 8.1   8.9   8.6      14: Glaucoma: continue latanoprost drops 15: Hypothyroidism: continue Synthroid 16: Diarrhea/loose stools: monitor; getting loperamide 2 mg daily (BID prn) -5/13 Continue current regimen. 17: Bowel and bladder incontinence: start bowel and bladder program 18. Daytime somnolence: [employer] and SW if therapy can be scheduled a little later in the day and if daughters can be present to help motivate her 90. Bradycardia: decrease lopressor to  BID 5/13 HR 79-95, WNL. 5/14 HR mostly above 60, but still has some episodes of bradycardia.  Currently just on amlodipine 10 mg, consider HR and B/P if deciding to add back lopressor.  LOS: 3 days A FACE TO FACE EVALUATION WAS PERFORMED  Tressia Miners 01/06/2022, 3:39 PM

## 2022-01-06 NOTE — Progress Notes (Signed)
Received call from [place], RN that CBG 301, only had SSI TID with meals, sensitive scale.  Changed SSI coverage to include ACHS still on sensitive scale. ?

## 2022-01-07 LAB — CBC
HCT: 21 % — ABNORMAL LOW (ref 36.0–46.0)
Hemoglobin: 6.6 g/dL — CL (ref 12.0–15.0)
MCH: 26.8 pg (ref 26.0–34.0)
MCHC: 31.4 g/dL (ref 30.0–36.0)
MCV: 85.4 fL (ref 80.0–100.0)
Platelets: 270 10*3/uL (ref 150–400)
RBC: 2.46 MIL/uL — ABNORMAL LOW (ref 3.87–5.11)
RDW: 14.9 % (ref 11.5–15.5)
WBC: 16.8 10*3/uL — ABNORMAL HIGH (ref 4.0–10.5)
nRBC: 0.2 % (ref 0.0–0.2)

## 2022-01-07 LAB — GLUCOSE, CAPILLARY
Glucose-Capillary: 165 mg/dL — ABNORMAL HIGH (ref 70–99)
Glucose-Capillary: 399 mg/dL — ABNORMAL HIGH (ref 70–99)
Glucose-Capillary: 308 mg/dL — ABNORMAL HIGH (ref 70–99)
Glucose-Capillary: 285 mg/dL — ABNORMAL HIGH (ref 70–99)

## 2022-01-07 LAB — PREPARE RBC (CROSSMATCH)

## 2022-01-07 LAB — BASIC METABOLIC PANEL
Anion gap: 5 (ref 5–15)
BUN: 60 mg/dL — ABNORMAL HIGH (ref 8–23)
CO2: 23 mmol/L (ref 22–32)
Calcium: 7.5 mg/dL — ABNORMAL LOW (ref 8.9–10.3)
Chloride: 112 mmol/L — ABNORMAL HIGH (ref 98–111)
Creatinine, Ser: 2.78 mg/dL — ABNORMAL HIGH (ref 0.44–1.00)
GFR, Estimated: 16 mL/min — ABNORMAL LOW (ref >60)
Glucose, Bld: 185 mg/dL — ABNORMAL HIGH (ref 70–99)
Potassium: 4.7 mmol/L (ref 3.5–5.1)
Sodium: 140 mmol/L (ref 135–145)

## 2022-01-07 MED ORDER — SODIUM CHLORIDE 0.9% IV SOLUTION: Freq: Once | INTRAVENOUS | Status: AC

## 2022-01-07 MED ORDER — PANTOPRAZOLE SODIUM 40 MG PO TBEC
40.0000 mg | DELAYED_RELEASE_TABLET | Freq: Every day | ORAL | Status: DC
  Administered 2022-01-07 – 2022-01-29 (×23): 40 mg via ORAL
  Filled 2022-01-07 (×24): qty 1

## 2022-01-07 NOTE — Progress Notes (Signed)
Notified Dr. Ranell Patrick of critical hemoglobin 6.6. New orders placed. ?

## 2022-01-07 NOTE — Evaluation (Addendum)
Speech Language Pathology Assessment and Plan ? ?Patient Details  ?Name: Patricia Dudley ?MRN: [id number] ?Date of Birth: Apr 08, 1930 ? ?SLP Diagnosis: Dysphagia;Cognitive Impairments;Speech and Language deficits  ?Rehab Potential: Fair ?ELOS: 3-4 weeks  ? ? ?Today's Date: 01/07/2022 ?SLP Individual Time: 2426-8341 ?SLP Individual Time Calculation (min): 49 min ? ? ?Hospital Problem: Principal Problem: ?  Acute embolic stroke [facility]) ? ?Past Medical History:  ?Past Medical History:  ?Diagnosis Date  ? A-fib ([place])   ? CVA (cerebral vascular accident) [facility])   ? Dementia ([place])   ? Diabetes mellitus without complication ([place])   ? GI bleed   ? Hyperlipemia   ? Hypertension   ? Thyroid disease   ? ?Past Surgical History:  ?Past Surgical History:  ?Procedure Laterality Date  ? COLONOSCOPY N/A 05/16/2017  ? Procedure: COLONOSCOPY;  Surgeon: Lin Landsman, MD;  Location: [facility] ENDOSCOPY;  Service: Gastroenterology;  Laterality: N/A;  ? ESOPHAGOGASTRODUODENOSCOPY N/A 08/08/2016  ? Procedure: ESOPHAGOGASTRODUODENOSCOPY (EGD);  Surgeon: [name], MD;  Location: [facility] ENDOSCOPY;  Service: Endoscopy;  Laterality: N/A;  ? ESOPHAGOGASTRODUODENOSCOPY N/A 05/16/2017  ? Procedure: ESOPHAGOGASTRODUODENOSCOPY (EGD);  Surgeon: Lin Landsman, MD;  Location: [facility] ENDOSCOPY;  Service: Gastroenterology;  Laterality: N/A;  ? ESOPHAGOGASTRODUODENOSCOPY (EGD) WITH PROPOFOL N/A 02/28/2017  ? Procedure: ESOPHAGOGASTRODUODENOSCOPY (EGD) WITH PROPOFOL;  Surgeon: [name], MD;  Location: [facility] ENDOSCOPY;  Service: Endoscopy;  Laterality: N/A;  ? EYE SURGERY    ? left  ? ? ?Assessment / Plan / Recommendation ?Clinical Impression Clinical swallow evaluation completed. Pt presents with s/sx consistent with oropharyngeal dysphagia. Oral deglutition characterized by delayed A-P transit, reduced mastication, post-swallow oral residuals, and generalized orofacial weakness. Pharyngeally, pt exhibits inconsistent, immediate cough with thin  liquids. No s/sx concerning for aspiration with nectar-thick and puree consistencies. Please refer to consistencies assessed section below for additional details.  ? ?Based on clinical findings, recommend continuation of modified PO diet (Dysphagia 1 with Nectar-Thick liquids) with with safe swallowing guidelines as outlined below. May benefit from instrumental swallow study to assess biomechanics of swallow function when awake and following commands more consistently; monitor intake and tolerance in the interim. Patient will benefit from skilled speech therapy services to address stated impairments with goal of optimizing safe swallow function. ?  ?Skilled Therapeutic Interventions          CSE completed.  ?SLP Assessment ? Patient will need skilled Speech Lanaguage Pathology Services during CIR admission  ?  ?Recommendations ? SLP Diet Recommendations: Dysphagia 1 (Puree);Nectar ?Liquid Administration via: Cup;Straw ?Medication Administration: Crushed with puree ?Supervision: Staff to assist with self feeding;Full supervision/cueing for compensatory strategies ?Compensations: Slow rate;Small sips/bites;Minimize environmental distractions;Follow solids with liquid ?Postural Changes and/or Swallow Maneuvers: Seated upright 90 degrees;Upright 30-60 min after meal ?Oral Care Recommendations: Oral care QID ?Patient destination: Home ?Follow up Recommendations: 24 hour supervision/assistance;Home Health SLP ?Equipment Recommended: To be determined  ?  ?SLP Frequency 3 to 5 out of 7 days   ?SLP Duration ? ?SLP Intensity ? ?SLP Treatment/Interventions 3-4 weeks ? ?Minumum of 1-2 x/day, 30 to 90 minutes ? ?Speech/Language facilitation;Patient/family education;Multimodal communication approach;Therapeutic Activities;Dysphagia/aspiration precaution training;Functional tasks   ? ?Pain ?Pain Assessment ?Pain Scale: 0-10 ?Pain Score: 0-No pain ? ?Prior Functioning ?Cognitive/Linguistic Baseline: Baseline deficits ?Baseline  deficit details: Per acute ST note, pt has dementia, daughter present [name] does not live with her; reports pt's needs were often anticipated and therefore she did not have to communicate much ?Type of Home: House ? Lives With: Family ?Available Help at  Discharge: Family ?Education: UTD ? ?Care Tool ?Care Tool Cognition ?Ability to hear (with hearing aid or hearing appliances if normally used Ability to hear (with hearing aid or hearing appliances if normally used): 2. Moderate difficulty - speaker has to increase volume and speak distinctly ?  ?Expression of Ideas and Wants Expression of Ideas and Wants: 1. Rarely/Never expressess or very difficult - rarely/never expresses self or speech is very difficult to understand ?  ?Understanding Verbal and Non-Verbal Content Understanding Verbal and Non-Verbal Content: 2. Sometimes understands - understands only basic conversations or simple, direct phrases. Frequently requires cues to understand  ?Memory/Recall Ability Memory/Recall Ability : That he or she is in a hospital/hospital unit (confirmed via yes/no questions)  ? ? ?Bedside Swallowing Assessment ?General ?Date of Onset: 12/24/21 ?Previous Swallow Assessment: CSE on 5/2 in acute care - "Recommend initiating puree diet with thin liquid when pt is awake/alert.  SLP to reassess for diet advancement and/or need for instrumental when pt is more alert." ?Diet Prior to this Study: Dysphagia 1 (puree);Thin liquids ?Temperature Spikes Noted: No ?Respiratory Status: Room air ?History of Recent Intubation: No ?Behavior/Cognition: Alert;Cooperative ?Oral Cavity - Dentition: Dentures, top;Missing dentition ?Self-Feeding Abilities: Total assist ?Patient Positioning: Upright in bed ?Baseline Vocal Quality: Normal ?Volitional Cough: Cognitively unable to elicit ?Volitional Swallow: Unable to elicit  ?Ice Chips ?Ice chips: Not tested ?Thin Liquid ?Thin Liquid: Impaired ?Presentation: Self Fed;Straw;Cup ?Oral Phase Functional  Implications: Prolonged oral transit;Oral holding ?Pharyngeal  Phase Impairments: Suspected delayed Swallow;Cough - Immediate;Multiple swallows ?Nectar Thick ?Nectar Thick Liquid: Impaired ?Presentation: Straw ?Oral phase functional implications: Prolonged oral transit ?Honey Thick ?Honey Thick Liquid: Not tested ?Puree ?Puree: Impaired ?Oral Phase Functional Implications: Oral residue;Prolonged oral transit ?Pharyngeal Phase Impairments: Suspected delayed Swallow;Wet Vocal Quality ?Solid ?Solid: Not tested ?BSE Assessment ?Suspected Esophageal Findings ?Suspected Esophageal Findings:  (N/A) ?Risk for Aspiration ?Impact on safety and function: Mild aspiration risk ?Other Related Risk Factors: Cognitive impairment;Lethargy;Deconditioning ? ?Short Term Goals: ?Week 1: SLP Short Term Goal 1 (Week 1): Pt will follow 3 commands within a functional context given Max multimodal A. ?SLP Short Term Goal 2 (Week 1): Pt will participate in MBSS to determine safety diet consistencies with 100% completion. ?SLP Short Term Goal 3 (Week 1): Pt will communicate wants, needs, ideas, and thoughts x 1 given Total A for use of mulitmodal communication methods. ?SLP Short Term Goal 4 (Week 1): Pt will maintain focused attention for 2 minutes with Mod A. ?SLP Short Term Goal 5 (Week 1): Pt will participate in ongoing cognitive-linguistic evlauation to further determine ST POC and aid in dc planning. ? ?Refer to Care Plan for Long Term Goals ? ?Recommendations for other services: None  ? ?Discharge Criteria: Patient will be discharged from SLP if patient refuses treatment 3 consecutive times without medical reason, if treatment goals not met, if there is a change in medical status, if patient makes no progress towards goals or if patient is discharged from hospital. ? ?The above assessment, treatment plan, treatment alternatives and goals were discussed and mutually agreed upon: by patient ? ?Paulino Cork A Stassi Fadely ?01/07/2022, 12:37 PM ? ? ?

## 2022-01-07 NOTE — Progress Notes (Signed)
RN called daughter Marianna Fuss, notified of blood transfusion, daughter confirmed consent for blood transfusion of one unit today. Verified over the phone with myself and Yehuda Mao.   ?

## 2022-01-07 NOTE — Progress Notes (Signed)
Patient ID: Patricia Dudley, female   DOB: Apr 11, 1930, 88 y.o.   MRN: [id number] ?Met with two of the patient' daughters to review rehab process, team conference and the plan of care. Discussed medications, secondary risk management including HTN, HLD (LDL 110/Trig 117) and DM (A1 C 6.5). Reviewed CKD and food choices to increase vitamins, calcium and protein. Daughter reported statin intolerance and adverse reaction to statin alternative PTA. Continue to follow along to discharge to address educational needs to facilitate preparation for discharge. Dorien Chihuahua B ? ?

## 2022-01-07 NOTE — Progress Notes (Signed)
?                                                       PROGRESS NOTE ? ? ?Subjective/Complaints: ?Elevated CBGs last night ?Daughter reports statin allergy, discontinued ?Hgb down to 6.5, occult stool and hgb ordered, heparin held ? ?ROS: Limited by language, unable to answer commands. ? ?Objective: ?  ?No results found. ?Recent Labs  ?  01/07/22 ?5053  ?WBC 16.8*  ?HGB 6.6*  ?HCT 21.0*  ?PLT 270  ? ?Recent Labs  ?  01/06/22 ?9767 01/07/22 ?3419  ?NA 144 140  ?K 4.7 4.7  ?CL 110 112*  ?CO2 25 23  ?GLUCOSE 191* 185*  ?BUN 68* 60*  ?CREATININE 3.20* 2.78*  ?CALCIUM 8.1* 7.5*  ? ? ?Intake/Output Summary (Last 24 hours) at 01/07/2022 1507 ?Last data filed at 01/07/2022 3790 ?Gross per 24 hour  ?Intake 1480 ml  ?Output --  ?Net 1480 ml  ?  ? ?  ? ?Physical Exam: ?Vital Signs ?Blood pressure (!) 115/93, pulse 89, temperature 98.2 ?F (36.8 ?C), resp. rate 17, height 5\' 5"  (1.651 m), weight 55.8 kg, SpO2 99 %. ? ?Gen: no distress, normal appearing, BMI 20.47 ?HENT: oral mucosa pink and moist, NCAT ?Eyes:  ?   General:     ?   Right eye: No discharge.     ?   Left eye: No discharge.  ?   Comments: Left lid closed ?R eye lid more open  ?Cardiovascular:  ?   Rate and Rhythm: irregular, a fib, had episodes of bradycardia ?   Heart sounds: Normal heart sounds. No murmur heard. ?  No gallop.  ?Pulmonary:  ?   Effort: Pulmonary effort is normal. No respiratory distress.  ?   Breath sounds: Normal breath sounds. No wheezing or rales.  ?Abdominal:  ?   General: Bowel sounds are normal. There is no distension.  ?   Palpations: Abdomen is soft.  ?   Tenderness: There is no abdominal tenderness.  ?Musculoskeletal:  ?   Cervical back: Neck supple.  ?   Comments: Limited participation in movement today, squeeze hand: L grip/biceps 5-/5 BUE: 3/5 grip/biceps.  Would not participate in RLE and LLE movement. ?Skin: ?   General: Skin is warm and dry.  ?Neurological:  ?   Mental Status: She is alert.  ?   Comments: Squeezes hands on command,  did protrude tongue on command today, made attempt to smile when asked. ?No verbal expression shaking head yes or no-  ?Was able to remain awake and alert during exam today. ?Psychiatric:  ?   Comments: Flat- nonverbal  ? ?Assessment/Plan: ?1. Functional deficits which require 3+ hours per day of interdisciplinary therapy in a comprehensive inpatient rehab setting. ?Physiatrist is providing close team supervision and 24 hour management of active medical problems listed below. ?Physiatrist and rehab team continue to assess barriers to discharge/monitor patient progress toward functional and medical goals ? ?Care Tool: ? ?Bathing ?   ?   ? Body parts bathed by helper: Right arm, Left arm, Chest, Abdomen, Front perineal area, Buttocks, Right upper leg, Left upper leg, Right lower leg, Left lower leg, Face ?Body parts n/a: Front perineal area, Buttocks ?  ?Bathing assist Assist Level: Dependent - Patient 0% ?  ?  ?Upper Body Dressing/Undressing ?Upper body  dressing   ?What is the patient wearing?: Irena only ?   ?Upper body assist Assist Level: Total Assistance - Patient < 25% ?   ?Lower Body Dressing/Undressing ?Lower body dressing ? ? ?   ?What is the patient wearing?: Incontinence brief, Pants ? ?  ? ?Lower body assist Assist for lower body dressing: Maximal Assistance - Patient 25 - 49% ?   ? ?Toileting ?Toileting    ?Toileting assist Assist for toileting: Total Assistance - Patient < 25% ?  ?  ?Transfers ?Chair/bed transfer ? ?Transfers assist ?   ? ?Chair/bed transfer assist level: Maximal Assistance - Patient 25 - 49% ?  ?  ?Locomotion ?Ambulation ? ? ?Ambulation assist ? ? Ambulation activity did not occur: Safety/medical concerns ? ?  ?  ?   ? ?Walk 10 feet activity ? ? ?Assist ? Walk 10 feet activity did not occur: Safety/medical concerns ? ?  ?   ? ?Walk 50 feet activity ? ? ?Assist Walk 50 feet with 2 turns activity did not occur: Safety/medical concerns ? ?  ?   ? ? ?Walk 150 feet  activity ? ? ?Assist Walk 150 feet activity did not occur: Safety/medical concerns ? ?  ?  ?  ? ?Walk 10 feet on uneven surface  ?activity ? ? ?Assist Walk 10 feet on uneven surfaces activity did not occur: Safety/medical concerns ? ? ?  ?   ? ?Wheelchair ? ? ? ? ?Assist Is the patient using a wheelchair?: No ?  ?Wheelchair activity did not occur: Safety/medical concerns ? ?  ?   ? ? ?Wheelchair 50 feet with 2 turns activity ? ? ? ?Assist ? ?  ?Wheelchair 50 feet with 2 turns activity did not occur: Safety/medical concerns ? ? ?   ? ?Wheelchair 150 feet activity  ? ? ? ?Assist ? Wheelchair 150 feet activity did not occur: Safety/medical concerns ? ? ?   ? ?Blood pressure (!) 115/93, pulse 89, temperature 98.2 ?F (36.8 ?C), resp. rate 17, height 5\' 5"  (1.651 m), weight 55.8 kg, SpO2 99 %. ? ? ? Medical Problem List and Plan: ?1. 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 ?            -patient may  shower ?            -ELOS/Goals: ~ 3 weeks- min A ? -Continue CIR ?2.  Anemia: hold Heparin Tat Momoli TID. Transfuse 1U on 5/15.  ?            -antiplatelet therapy: aspirin 81 mg daily ?3. Pain Management: Tylenol prn ?4. Mood: LCSW to evaluate and provide emotional support ?            -antipsychotic agents: n/a ?5. Neuropsych: This patient is not capable of making decisions on her own behalf. ?6. Skin/Wound Care: Routine skin care checks ?7. Fluids/Electrolytes/Nutrition: Routine Is and Os and follow-up chemistries ?            -- Dysphagia 1 diet with thin liquids.  Crushed meds with applesauce.  SLP evaluation. ?            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 ? ?  01/07/2022  ?  1:22 PM 01/07/2022  ?  1:07 PM 01/07/2022  ? 12:20  PM  ?Vitals with BMI  ?Systolic 570  177 939  ?Diastolic 93 93 57  ?Pulse 89 83 85  ?  ?9: Diabetes mellitus: CBGs qAC,qHS; monitor intake and activity ?--continue SSI ?--consider restarting home Glucotrol XL 10 mg ?10: Hypertension: increase lisinopril to 40 mg, amlodipine 10 mg, Lopressor. Repeat creatinine on Monday given increase in Lisinopril.  ?5/13 BP controlled today. ?5/14 some variability in B/P, with mild elevation, some episodes of bradycardia (HR 46 at 051 today). ?-D/c lisinopril today since BUN 28->68 and Crt 2.28->3.20, with increase in past 2 days.  Hold lopressor with bradycardia episodes.  Will see how B/P trends on amlodipine 10 mg, may consider adding hydralazine if B/p is elevated.   ?-Fluid bolus (1L NS) and continuous fluid (0.45 NS at 75 ml/hr) given for rise in BUN and Crt, trend B/p with IVF. ? ? ? ?  01/07/2022  ?  1:22 PM 01/07/2022  ?  1:07 PM 01/07/2022  ? 12:20 PM  ?Vitals with BMI  ?Systolic 030 092 330  ?Diastolic 93 93 57  ?Pulse 89 83 85  ?  ?11: Hyperlipidemia: continue rosuvastatin 10 mg ?12: Moderate malnutrition: continue supplements ?13: Chronic kidney disease stage 3b: follow-up BMP ?    -5/13 B/P is in the 120's today. Pt had lisinopril increased to 40 mg yesterday for elevated B/P.  Since lisinopril may increase Crt, decrease lisinopril to 20 mg daily today and push po intake and repeat BMP. ?-5/14 Crt up to 3.2 and BUN up to 68.  Fluid bolus (1L NS) and continuous fluid (0.45 NS at 75 ml/hr) given for rise in BUN and Crt. ?-d/c lisinopril (per Dr. Christella Noa) since could be a possible cause of elevated Crt . ?-Obtain repeat BMP 5 am on 01/07/22. ? ? ?  Latest Ref Rng & Units 01/07/2022  ?  7:48 AM 01/06/2022  ?  5:53 AM 01/04/2022  ?  5:58 AM  ?BMP  ?Glucose 70 - 99 mg/dL 185   191   214    ?BUN 8 - 23 mg/dL 60   68   28    ?Creatinine 0.44 - 1.00 mg/dL 2.78   3.20   2.28    ?Sodium 135 - 145 mmol/L 140   144   142    ?Potassium 3.5 - 5.1 mmol/L 4.7   4.7   4.7    ?Chloride 98 - 111 mmol/L 112   110   110    ?CO2  22 - 32 mmol/L 23   25   26     ?Calcium 8.9 - 10.3 mg/dL 7.5   8.1   8.9    ?  ?14: Glaucoma: continue latanoprost drops ?15: Hypothyroidism: continue Synthroid ?16: Diarrhea/loose stools: monitor; getting loperamide 2 mg

## 2022-01-07 NOTE — Progress Notes (Addendum)
Speech Language Pathology Daily Session Note ? ?Patient Details  ?Name: Patricia Dudley ?MRN: [id number] ?Date of Birth: June 28, 1930 ? ?Today's Date: 01/07/2022 ?SLP Individual Time: 4665-9935 ?SLP Individual Time Calculation (min): 49 min ? ?Short Term Goals: ?Week 1: SLP Short Term Goal 1 (Week 1): Pt will follow 3 commands within a functional context given Max multimodal A. ?SLP Short Term Goal 2 (Week 1): Pt will participate in MBSS to determine safety diet consistencies with 100% completion. ?SLP Short Term Goal 3 (Week 1): Pt will communicate wants, needs, ideas, and thoughts x 1 given Total A for use of mulitmodal communication methods. ?SLP Short Term Goal 4 (Week 1): Pt will maintain focused attention for 2 minutes with Mod A. ?SLP Short Term Goal 5 (Week 1): Pt will participate in ongoing cognitive-linguistic evlauation to further determine ST POC and aid in dc planning. ? ?Skilled Therapeutic Interventions: ?Pt seen this date for skilled ST interventions targeting communication outlined above. Pt received in bed with eyes closed. Aroused to name. Agreeable to ST intervention ? ?SLP facilitated today's session by providing Max to Total A for consumption of thin liquid, nectar-thick liquid, and puree consistencies. Inconsistent s/sx concerning for aspiration observed to include immediate cough with thin liquids and wet vocal quality. Please see CSE documentation for additional information. Re: speech and language, pt answered biographical yes/no questions with ~70% accuracy with decreased accuracy for basic yes/no questions. Answered simple questions re: personal information when given extra processing time and verbal cues. Communicated wants and needs via head gestures (head nod for yes and head shake for no). Followed one-step, functional commands inconsistently (<50% of the time). Maintained focused attention for 2+ minutes without redirection. ? ?Pt left in bed with bed alarm on, call bell within reach,  and all immediate needs met. Continue per current ST POC. ? ?Pain ?No pain reported this session; NAD ? ?Therapy/Group: Individual Therapy ? ?Hector Taft A Geremiah Fussell ?01/07/2022, 1:34 PM ?

## 2022-01-07 NOTE — Progress Notes (Signed)
Physical Therapy Session Note ? ?Patient Details  ?Name: Patricia Dudley ?MRN: [id number] ?Date of Birth: 08-15-1930 ? ?Today's Date: 01/07/2022 ?PT Individual Time: 1017-5102 ?PT Individual Time Calculation (min): 55 min  ? ?Short Term Goals: ?Week 1:  PT Short Term Goal 1 (Week 1): Pt will roll side to side w/ max A ?PT Short Term Goal 2 (Week 1): Pt will transfer sup to sit w/ max A ?PT Short Term Goal 3 (Week 1): Pt will transfer sit to stand w/ max A ?PT Short Term Goal 4 (Week 1): Pt will amb w/ LRAD x 10'. ? ?Skilled Therapeutic Interventions/Progress Updates:  ?  Pt presents in bed and cleared with RN and PA for mobility to tolerance as currently undergoing blood transfusion. Pt with no adverse reactions or appearance of any symptoms during session. Pt slow to arouse but remains alert, generally non verbal but did say a "yes" a few times throughout session and smiled. Pt requires max assist for facilitation for supine to sit EOB with HOB elevated for advantage and multimodal cues. Once EOB able to maintain balance with min progressing to close supervision. Extra time and multimodal cues and encouragement to perform sit > stand with RW from EOB> initially stood for about 10 sec and immediately sat back down. Attempted again with ligher max assist and performed stand step transfer with RW with +2 for safety with mod assist for balance - cues for sequencing and stepping as well as assist with RW management needed. Transported to gym via w/c total assist. Used BITS to attempt to engage patient in attention and visual scanning activity to identify dot on screen and hit the target - despite extra time provided and max verbal cues as well as hand over hand assist, pt unable to initiate touchign the screen without hand over hand assist. Once initiated by PT pt able to follow through about 25% of the time. Pt does demonstrate ability to visually locate dot < 50% but requires verbal and sometimes auditory cues for  focused attention to task. Returned back to room and performed stand step transfer back to bed with RW with max cues for initiation and sequencing but overall mod assist. Once EOB pt with difficulty motor planning to return to supine requiring multimodal cues and returned to supine with max assist. Daughter at bedside.  ? ?Therapy Documentation ?Precautions:  ?Precautions ?Precautions: Fall ?Precaution Comments: aspiration precautions ?Restrictions ?Weight Bearing Restrictions: No ? ?Pain: ?Pt mostly nonverbal but does not grimace or appear to have pain.  ? ? ? ?Therapy/Group: Individual Therapy ? ?[name] ?[name], PT, DPT, CBIS ? ?01/07/2022, 3:19 PM  ?

## 2022-01-07 NOTE — Progress Notes (Signed)
Occupational Therapy Session Note ? ?Patient Details  ?Name: Patricia Dudley ?MRN: [id number] ?Date of Birth: [birth year] ? ?Today's Date: 01/07/2022 ?OT Individual Time: 6387-5643 ?OT Individual Time Calculation (min): 71 min  ? ? ?Short Term Goals: ?Week 1:  OT Short Term Goal 1 (Week 1): Pt will visually attend to R side of body with min A during functional task ?OT Short Term Goal 2 (Week 1): Pt will follow 1 step command in prep for ADL task, with use of multimodal cues and no more than min A ?OT Short Term Goal 3 (Week 1): Pt will roll either R or L during bed level toileting, with mod A and use of multimodal cues as needed ? ?Skilled Therapeutic Interventions/Progress Updates:  ?Skilled OT intervention completed with focus on activity tolerance, functional transfers, static sitting balance and sit <> stands. Pt received upright in bed, non-verbal for duration of session, with pt unable to verbalize or shake head in response to pain, however was able to smile at therapist. Pt was able to express "hi" when therapist introduced self. Pt found to be frequently grabbing at brief. Incontinence of BM and bladder found in brief- nurse aware for charting. Completed toileting at bed level rolling R<>L with min A and hand over hand cues needed for reaching toward bed rails/scanning to side for maximizing rolling efficiency. Total A needed for pericare, and donning of new brief, with wash cloth utilized in pt's hands to occupy pt from scratching/getting hands into BM vs restraining which worked well. Donned pants bed level at max A with pt able to lift each foot with tactile and facilitory cues provided and pt attempting to bridge hips up when feet placed on bed as well as attempting to pull at waist band to donn over hips.  ? ?Transitioned supine > sitting EOB with initial total A for trunk control, fading to max A as well as max A for BLE and to maintain static sitting balance. Pt presented with anterior vs posterior  lean this session, with heavy max A needed to maintain upright posture, with pt minimally responsive to multimodal cues for trunk weight shifting, however did fade to heavy min A for duration of sitting balance, with min improvement despite therapist removing trunk assist. 3 sit <> stands at EOB with HHA with mod A +2, max A for static standing balance and failure to bring chest upright and maintain stance despite commands. Transitioned to RW use, as pt has heavy dependence on hand security, with pt able to stand at mod A +2, and improved trunk posture with facilitory cues. Stand pivot with RW with mod A +2 to TIS chair, with facilitory cues needed for RLE.  ? ?Transported in w/c <> gym with total A. Completed series of sit <> stands with use of mirror for visual feedback, with HHA mod A +2 initally however pt unable to maintain standing balance for longer than a couple seconds, therefore transitioned to RW, with pt able to stand with min A +2 with mod A of 1 needed for standing balance. Use of visual target placed on top of mirror for improved posture, however limited visual scanning despite cues. Mod A +2 sit > stand and stand pivot with RW back to bed, and max A for EOB > supine in bed, and pt able to assist with repositioning in bed with facilitatory cues. Pt was left upright >35 degrees in bed, with bed alarm on and all needs in reach at end of session, with  pt able to indicate on call bell where to call for help when prompted. ? ?Therapy Documentation ?Precautions:  ?Precautions ?Precautions: Fall ?Precaution Comments: aspiration precautions ?Restrictions ?Weight Bearing Restrictions: No ? ? ? ? ?Therapy/Group: Individual Therapy ? ?[place] ?01/07/2022, 7:46 AM ?

## 2022-01-07 NOTE — Progress Notes (Signed)
Daughter, [name], reported patient intolerance of statins today. She stated her mother developed skin blistering and joint pain which resolved when medication stopped. Statin allergy added to list and discontinued. Advised to inform PCP/neurologist at time of follow-up. ?

## 2022-01-08 LAB — BPAM RBC
Blood Product Expiration Date: 202306052359
ISSUE DATE / TIME: 202305151243
Unit Type and Rh: 7300

## 2022-01-08 LAB — TYPE AND SCREEN
ABO/RH(D): B POS
Antibody Screen: NEGATIVE
Unit division: 0

## 2022-01-08 LAB — CBC
HCT: 25.3 % — ABNORMAL LOW (ref 36.0–46.0)
Hemoglobin: 8.7 g/dL — ABNORMAL LOW (ref 12.0–15.0)
MCH: 29.2 pg (ref 26.0–34.0)
MCHC: 34.4 g/dL (ref 30.0–36.0)
MCV: 84.9 fL (ref 80.0–100.0)
Platelets: 295 10*3/uL (ref 150–400)
RBC: 2.98 MIL/uL — ABNORMAL LOW (ref 3.87–5.11)
RDW: 14.7 % (ref 11.5–15.5)
WBC: 14.2 10*3/uL — ABNORMAL HIGH (ref 4.0–10.5)
nRBC: 0.2 % (ref 0.0–0.2)

## 2022-01-08 LAB — GLUCOSE, CAPILLARY
Glucose-Capillary: 231 mg/dL — ABNORMAL HIGH (ref 70–99)
Glucose-Capillary: 305 mg/dL — ABNORMAL HIGH (ref 70–99)
Glucose-Capillary: 288 mg/dL — ABNORMAL HIGH (ref 70–99)
Glucose-Capillary: 283 mg/dL — ABNORMAL HIGH (ref 70–99)

## 2022-01-08 MED ORDER — INSULIN ASPART 100 UNIT/ML IJ SOLN
0.0000 [IU] | Freq: Three times a day (TID) | INTRAMUSCULAR | Status: DC
  Administered 2022-01-08: 5 [IU] via SUBCUTANEOUS
  Administered 2022-01-09: 2 [IU] via SUBCUTANEOUS
  Administered 2022-01-09: 5 [IU] via SUBCUTANEOUS
  Administered 2022-01-09: 2 [IU] via SUBCUTANEOUS
  Administered 2022-01-10: 3 [IU] via SUBCUTANEOUS
  Administered 2022-01-10: 5 [IU] via SUBCUTANEOUS
  Administered 2022-01-10: 2 [IU] via SUBCUTANEOUS
  Administered 2022-01-11 (×3): 3 [IU] via SUBCUTANEOUS
  Administered 2022-01-12: 7 [IU] via SUBCUTANEOUS
  Administered 2022-01-12: 1 [IU] via SUBCUTANEOUS
  Administered 2022-01-12: 3 [IU] via SUBCUTANEOUS
  Administered 2022-01-13: 2 [IU] via SUBCUTANEOUS
  Administered 2022-01-13 – 2022-01-14 (×3): 3 [IU] via SUBCUTANEOUS
  Administered 2022-01-14: 5 [IU] via SUBCUTANEOUS
  Administered 2022-01-15 (×3): 3 [IU] via SUBCUTANEOUS
  Administered 2022-01-16 (×2): 7 [IU] via SUBCUTANEOUS
  Administered 2022-01-17 (×2): 2 [IU] via SUBCUTANEOUS
  Administered 2022-01-18: 5 [IU] via SUBCUTANEOUS
  Administered 2022-01-19: 9 [IU] via SUBCUTANEOUS
  Administered 2022-01-19: 3 [IU] via SUBCUTANEOUS
  Administered 2022-01-20 (×2): 5 [IU] via SUBCUTANEOUS
  Administered 2022-01-21: 1 [IU] via SUBCUTANEOUS
  Administered 2022-01-21: 2 [IU] via SUBCUTANEOUS
  Administered 2022-01-21: 1 [IU] via SUBCUTANEOUS
  Administered 2022-01-22: 5 [IU] via SUBCUTANEOUS
  Administered 2022-01-22: 7 [IU] via SUBCUTANEOUS
  Administered 2022-01-23: 5 [IU] via SUBCUTANEOUS
  Administered 2022-01-23: 2 [IU] via SUBCUTANEOUS
  Administered 2022-01-23: 3 [IU] via SUBCUTANEOUS
  Administered 2022-01-24: 2 [IU] via SUBCUTANEOUS
  Administered 2022-01-24: 5 [IU] via SUBCUTANEOUS
  Administered 2022-01-24: 2 [IU] via SUBCUTANEOUS
  Administered 2022-01-25: 5 [IU] via SUBCUTANEOUS
  Administered 2022-01-25: 3 [IU] via SUBCUTANEOUS
  Administered 2022-01-26: 1 [IU] via SUBCUTANEOUS
  Administered 2022-01-26: 2 [IU] via SUBCUTANEOUS
  Administered 2022-01-27: 1 [IU] via SUBCUTANEOUS
  Administered 2022-01-27: 2 [IU] via SUBCUTANEOUS
  Administered 2022-01-27: 3 [IU] via SUBCUTANEOUS
  Administered 2022-01-28 (×2): 2 [IU] via SUBCUTANEOUS
  Administered 2022-01-28 – 2022-01-29 (×2): 3 [IU] via SUBCUTANEOUS
  Administered 2022-01-29: 5 [IU] via SUBCUTANEOUS
  Administered 2022-01-29: 1 [IU] via SUBCUTANEOUS

## 2022-01-08 MED ORDER — INSULIN ASPART 100 UNIT/ML IJ SOLN
0.0000 [IU] | Freq: Every day | INTRAMUSCULAR | Status: DC
  Administered 2022-01-08 – 2022-01-11 (×4): 3 [IU] via SUBCUTANEOUS
  Administered 2022-01-12: 2 [IU] via SUBCUTANEOUS
  Administered 2022-01-15 – 2022-01-16 (×2): 3 [IU] via SUBCUTANEOUS
  Administered 2022-01-17: 2 [IU] via SUBCUTANEOUS

## 2022-01-08 NOTE — Progress Notes (Signed)
?                                                       PROGRESS NOTE ? ? ?Subjective/Complaints: ?Appreciate diabetes coordinator recs. Will start with d/cing ensure between meals to see if this helps decrease CBGs.  ?No issues reported overnight ? ?ROS: Limited by language, unable to answer commands. ? ?Objective: ?  ?No results found. ?Recent Labs  ?  01/07/22 ?0748 01/08/22 ?0901  ?WBC 16.8* 14.2*  ?HGB 6.6* 8.7*  ?HCT 21.0* 25.3*  ?PLT 270 295  ? ?Recent Labs  ?  01/06/22 ?8416 01/07/22 ?6063  ?NA 144 140  ?K 4.7 4.7  ?CL 110 112*  ?CO2 25 23  ?GLUCOSE 191* 185*  ?BUN 68* 60*  ?CREATININE 3.20* 2.78*  ?CALCIUM 8.1* 7.5*  ? ? ?Intake/Output Summary (Last 24 hours) at 01/08/2022 1135 ?Last data filed at 01/08/2022 0160 ?Gross per 24 hour  ?Intake 918.33 ml  ?Output --  ?Net 918.33 ml  ?  ? ?  ? ?Physical Exam: ?Vital Signs ?Blood pressure 137/86, pulse 91, temperature 98.2 ?F (36.8 ?C), resp. rate 16, height 5\' 5"  (1.651 m), weight 55.8 kg, SpO2 99 %. ? ?Gen: no distress, normal appearing, BMI 20.47 ?HENT: oral mucosa pink and moist, NCAT ?Eyes:  ?   General:     ?   Right eye: No discharge.     ?   Left eye: No discharge.  ?   Comments: Left lid closed ?R eye lid more open  ?Mouth: denitrues in place ?Cardiovascular:  ?   Rate and Rhythm: irregular, a fib, had episodes of bradycardia ?   Heart sounds: Normal heart sounds. No murmur heard. ?  No gallop.  ?Pulmonary:  ?   Effort: Pulmonary effort is normal. No respiratory distress.  ?   Breath sounds: Normal breath sounds. No wheezing or rales.  ?Abdominal:  ?   General: Bowel sounds are normal. There is no distension.  ?   Palpations: Abdomen is soft.  ?   Tenderness: There is no abdominal tenderness.  ?Musculoskeletal:  ?   Cervical back: Neck supple.  ?   Comments: Limited participation in movement today, squeeze hand: L grip/biceps 5-/5 BUE: 3/5 grip/biceps.  Would not participate in RLE and LLE movement. ?Skin: ?   General: Skin is warm and dry.   ?Neurological:  ?   Mental Status: She is alert.  ?   Comments: Squeezes hands on command, did protrude tongue on command today, made attempt to smile when asked. ?No verbal expression shaking head yes or no-  ?Was able to remain awake and alert during exam today. ?Psychiatric:  ?   Comments: Smiling, nonverbal ? ?Assessment/Plan: ?1. Functional deficits which require 3+ hours per day of interdisciplinary therapy in a comprehensive inpatient rehab setting. ?Physiatrist is providing close team supervision and 24 hour management of active medical problems listed below. ?Physiatrist and rehab team continue to assess barriers to discharge/monitor patient progress toward functional and medical goals ? ?Care Tool: ? ?Bathing ?   ?Body parts bathed by patient: Left lower leg, Right lower leg, Left upper leg, Right upper leg, Front perineal area  ? Body parts bathed by helper: Right arm, Left arm, Chest, Abdomen, Buttocks, Face ?Body parts n/a: Front perineal area, Buttocks ?  ?Bathing assist Assist  Level: Total Assistance - Patient < 25% ?  ?  ?Upper Body Dressing/Undressing ?Upper body dressing   ?What is the patient wearing?: Pull over shirt ?   ?Upper body assist Assist Level: Total Assistance - Patient < 25% ?   ?Lower Body Dressing/Undressing ?Lower body dressing ? ? ?   ?What is the patient wearing?: Incontinence brief, Pants ? ?  ? ?Lower body assist Assist for lower body dressing: Total Assistance - Patient < 25% (sit > stand) ?   ? ?Toileting ?Toileting    ?Toileting assist Assist for toileting: Total Assistance - Patient < 25% ?  ?  ?Transfers ?Chair/bed transfer ? ?Transfers assist ?   ? ?Chair/bed transfer assist level: Maximal Assistance - Patient 25 - 49% ?  ?  ?Locomotion ?Ambulation ? ? ?Ambulation assist ? ? Ambulation activity did not occur: Safety/medical concerns ? ?  ?  ?   ? ?Walk 10 feet activity ? ? ?Assist ? Walk 10 feet activity did not occur: Safety/medical concerns ? ?  ?   ? ?Walk 50 feet  activity ? ? ?Assist Walk 50 feet with 2 turns activity did not occur: Safety/medical concerns ? ?  ?   ? ? ?Walk 150 feet activity ? ? ?Assist Walk 150 feet activity did not occur: Safety/medical concerns ? ?  ?  ?  ? ?Walk 10 feet on uneven surface  ?activity ? ? ?Assist Walk 10 feet on uneven surfaces activity did not occur: Safety/medical concerns ? ? ?  ?   ? ?Wheelchair ? ? ? ? ?Assist Is the patient using a wheelchair?: No ?  ?Wheelchair activity did not occur: Safety/medical concerns ? ?  ?   ? ? ?Wheelchair 50 feet with 2 turns activity ? ? ? ?Assist ? ?  ?Wheelchair 50 feet with 2 turns activity did not occur: Safety/medical concerns ? ? ?   ? ?Wheelchair 150 feet activity  ? ? ? ?Assist ? Wheelchair 150 feet activity did not occur: Safety/medical concerns ? ? ?   ? ?Blood pressure 137/86, pulse 91, temperature 98.2 ?F (36.8 ?C), resp. rate 16, height 5\' 5"  (1.651 m), weight 55.8 kg, SpO2 99 %. ? ? ? Medical Problem List and Plan: ?1. 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 ?            -patient may  shower ?            -ELOS/Goals: ~ 3 weeks- min A ? -Continue CIR ?2.  Anemia: resume Heparin [place] TID. Transfuse 1U on 5/15.  ?            -antiplatelet therapy: aspirin 81 mg daily ?3. Pain Management: Tylenol prn ?4. Mood: LCSW to evaluate and provide emotional support ?            -antipsychotic agents: n/a ?5. Neuropsych: This patient is not capable of making decisions on her own behalf. ?6. Skin/Wound Care: Routine skin care checks ?7. Dysphagia ?            -- continue Dysphagia 1 diet with thin liquids.  Crushed meds with applesauce.  SLP evaluation. ?            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 ? ?  01/08/2022  ?  5:19  AM 01/07/2022  ?   8:28 PM 01/07/2022  ?  4:22 PM  ?Vitals with BMI  ?Systolic 179 150 569  ?Diastolic 86 65 67  ?Pulse 91 92 89  ?  ?9: Diabetes mellitus: CBGs qAC,qHS; monitor intake and activity ?--continue SSI ?--consider restarting home Glucotrol XL 10 mg ?D/c Ensure between meals ?10: Hypertension: increase lisinopril to 40 mg, amlodipine 10 mg, Lopressor. Repeat creatinine on Monday given increase in Lisinopril.  ?5/13 BP controlled today. ?5/14 some variability in B/P, with mild elevation, some episodes of bradycardia (HR 46 at 051 today). ?-D/c lisinopril today since BUN 28->68 and Crt 2.28->3.20, with increase in past 2 days.  Hold lopressor with bradycardia episodes.  Will see how B/P trends on amlodipine 10 mg, may consider adding hydralazine if B/p is elevated.   ?-Fluid bolus (1L NS) and continuous fluid (0.45 NS at 75 ml/hr) given for rise in BUN and Crt, trend B/p with IVF. ? ? ? ?  01/08/2022  ?  5:19 AM 01/07/2022  ?  8:28 PM 01/07/2022  ?  4:22 PM  ?Vitals with BMI  ?Systolic 794 801 655  ?Diastolic 86 65 67  ?Pulse 91 92 89  ?  ?11: Hyperlipidemia: continue rosuvastatin 10 mg ?12: Moderate malnutrition: continue supplements ?13: Chronic kidney disease stage 3b: follow-up BMP ?    -5/13 B/P is in the 120's today. Pt had lisinopril increased to 40 mg yesterday for elevated B/P.  Since lisinopril may increase Crt, decrease lisinopril to 20 mg daily today and push po intake and repeat BMP. ?-5/14 Crt up to 3.2 and BUN up to 68.  Fluid bolus (1L NS) and continuous fluid (0.45 NS at 75 ml/hr) given for rise in BUN and Crt. ?-d/c lisinopril (per Dr. Christella Noa) since could be a possible cause of elevated Crt . ?-Obtain repeat BMP 5 am on 01/07/22. ? ? ?  Latest Ref Rng & Units 01/07/2022  ?  7:48 AM 01/06/2022  ?  5:53 AM 01/04/2022  ?  5:58 AM  ?BMP  ?Glucose 70 - 99 mg/dL 185   191   214    ?BUN 8 - 23 mg/dL 60   68   28    ?Creatinine 0.44 - 1.00 mg/dL 2.78   3.20   2.28    ?Sodium 135 - 145 mmol/L 140   144   142     ?Potassium 3.5 - 5.1 mmol/L 4.7   4.7   4.7    ?Chloride 98 - 111 mmol/L 112   110   110    ?CO2 22 - 32 mmol/L 23   25   26     ?Calcium 8.9 - 10.3 mg/dL 7.5   8.1   8.9    ?  ?14: Glaucoma: continue latanoprost drops ?15: Hyp

## 2022-01-08 NOTE — Progress Notes (Signed)
Physical Therapy Session Note ? ?Patient Details  ?Name: Patricia Dudley ?MRN: [id number] ?Date of Birth: 10-14-1929 ? ?Today's Date: 01/08/2022 ?PT Individual Time: 7741-2878 ?PT Individual Time Calculation (min): 68 min  ? Today's Date: 01/08/2022 ?PT Missed Time: 7 Minutes ?Missed Time Reason: Patient fatigue ? ?Short Term Goals: ?Week 1:  PT Short Term Goal 1 (Week 1): Pt will roll side to side w/ max A ?PT Short Term Goal 2 (Week 1): Pt will transfer sup to sit w/ max A ?PT Short Term Goal 3 (Week 1): Pt will transfer sit to stand w/ max A ?PT Short Term Goal 4 (Week 1): Pt will amb w/ LRAD x 10'. ? ?Skilled Therapeutic Interventions/Progress Updates:  ? Received pt sitting in WC asleep with eyes open and with significant L cervical flexion. Pt required significantly increased time and max verbal/tactile stimuli to arouse with max cues to "open eyes", however did not appear to be in any pain. Transported pt to dayroom for change in environmental stimuli to sustain arousal/alertness. Session with emphasis on functional mobility/transfers, generalized strengthening, dynamic standing balance/coordination, and NMR/motor planning. Pt with moderate R inattention with gaze preference to L - emphasized R cervical rotation and attention throughout session. Of note, pt with significantly impaired motor planning/sequencing throughout session, requiring max cues throughout session. Pt required numerous trials and max A to stand twice from [facility] pulling with both UEs on RW. However, upon standing pt immediately sitting back down. Stand<>pivot WC<>mat with RW and mod A +2 with total A to manage/steer RW and cues for stepping sequence/turning technique - pt freezing when cued to sit on mat and required tactile cues/facilitation at pelvis to actually sit. Stood from [facility] with RW and mod A of 1 and attempted to work on attention to task, placing horseshoes in basket however pt began sitting back down immediately once standing;  therefore transitioned to activity seated. Pt required hand over hand guidance to grab horseshoes with RUE and move down and to the L towards basket but was able to release grasp to drop horseshoe. Pt with significant anterior lean while sitting EOB (but no LOB) and unable to correct with verbal cues. Therapist manually facilitated correction of lean, but pt unable to maintain midline, lying back against therapist or leaning too far anteriorly. Pt transferred mat<>WC with RW and mod A +2 with same cues mentioned above and returned to room. Pt transferred WC<>bed stand<>pivot with RW and mod A +2 and sit<>supine with max A. Pt scooted to [facility] with +2 assist and use of Trendelenburg bed position. Concluded session with pt semi-reclined in bed, needs within reach, and bed alarm on. RUE supported on pillow for edema management and improved hemibody positioning.   ? ?Therapy Documentation ?Precautions:  ?Precautions ?Precautions: Fall ?Precaution Comments: aspiration precautions ?Restrictions ?Weight Bearing Restrictions: No ? ?Therapy/Group: Individual Therapy ?Blenda Nicely ?Becky Sax PT, DPT  ?01/08/2022, 6:56 AM  ?

## 2022-01-08 NOTE — Progress Notes (Signed)
Inpatient Diabetes Program Recommendations ? ?AACE/ADA: New Consensus Statement on Inpatient Glycemic Control (2015) ? ?Target Ranges:  Prepandial:   less than 140 mg/dL ?     Peak postprandial:   less than 180 mg/dL (1-2 hours) ?     Critically ill patients:  140 - 180 mg/dL  ? ?Lab Results  ?Component Value Date  ? GLUCAP 231 (H) 01/08/2022  ? HGBA1C 6.5 (H) 12/25/2021  ? ? ?Review of Glycemic Control ? Latest Reference Range & Units 01/07/22 06:02 01/07/22 12:05 01/07/22 17:07 01/07/22 21:02 01/08/22 06:30  ?Glucose-Capillary 70 - 99 mg/dL 165 (H) ? ?Novolog 2 units 399 (H) ? ?Novolog 9 units 308 (H) ? ?Novolog 7 units 285 (H) ? ?Novolog 5 units 231 (H) ? ?Novolog 3 units  ? ?Diabetes history: DM 2 ?Outpatient Diabetes medications: Glipizide 10 mg Daily ?Current orders for Inpatient glycemic control:  ?Novolog 0-9 units tid + hs ? ?Inpatient Diabetes Program Recommendations:   ? ?Glucose trends increase after meal intake ? ?-  Consider starting Novolog 3 units tid meal coverage if pt eating at least 50% of meals ? ?Thanks, ? ?Tama Headings RN, MSN, BC-ADM ?Inpatient Diabetes Coordinator ?Team Pager [phone] (8a-5p) ?

## 2022-01-08 NOTE — Progress Notes (Signed)
Occupational Therapy Session Note ? ?Patient Details  ?Name: Patricia Dudley ?MRN: [id number] ?Date of Birth: [birth year] ? ?Today's Date: 01/08/2022 ?OT Individual Time: 0900-1000 ?OT Individual Time Calculation (min): 60 min  ? ? ?Short Term Goals: ?Week 1:  OT Short Term Goal 1 (Week 1): Pt will visually attend to R side of body with min A during functional task ?OT Short Term Goal 2 (Week 1): Pt will follow 1 step command in prep for ADL task, with use of multimodal cues and no more than min A ?OT Short Term Goal 3 (Week 1): Pt will roll either R or L during bed level toileting, with mod A and use of multimodal cues as needed ? ?Skilled Therapeutic Interventions/Progress Updates:  ?Skilled OT intervention completed with focus on activity tolerance within a functional/ADL context to maximize pt's motor planning, participation and endurance. Pt received upright in bed, non-verbal for 95% of session other than "good morning" when therapist greeted pt and several smiles indicating absent pain during session. ? ?Pt requires consistent max to total A multimodal cueing for sequencing, motor planning and initiating tasks. Pt did however demonstrate improved one step verbal command following with increased time provided. Completed bed mobility with max A for trunk control and BLE management. Able to maintain static sitting balance with from min A to supervision during EOB sitting with BUE supported, no LOB while EOB this session. Sit > stand using RW with min A of 1, then stand pivot using RW with +2 min A for safety. Required facilitory cues for sitting consistently. Stand pivot using BUE on grab bars via [place] assist, then sit > stand with mod A of 1 and stand pivot to toilet with total A needed for toileting/hygiene as pt had incontinent BM and continent void in toilet. 4 step ambulatory transfer from toilet to [facility] in shower with mod A +2 HHA, with pt attempting to sit on multiple occassions, requiring up to heavy mod A  under bottom to facilitate forward gait pattern to sit on seat, with overall max A needed to side step into shower with pt holding grab bars. Covered pt's IV prior to shower with waterproof cover.  ? ?Pt demonstrated the ability to initiate bathing tasks with use of HOH assist and multimodal total A cueing, reaching BLEs and front perineal. Sit > stand in shower with pt having recurrent BM, but able to adequately clean with min A for balance using grab bar and total A for cleaning. Sit > stand and stand pivot > TIS chair with initial min A, however pt with poor motor planning and urgency to sit mid-way through requiring total A to pivot hips and trunk into chair. Max A needed for UB and LB dressing at the sit > stand level with use of sink for standing balance and min A. Donned shoes via figure 4 position, with total A for sequencing and motor planning. Pt was left tilted in TIS chair with pillow behind head for positioning, belt alarm on and all needs in reach at end of session.  ? ? ?Therapy Documentation ?Precautions:  ?Precautions ?Precautions: Fall ?Precaution Comments: aspiration precautions ?Restrictions ?Weight Bearing Restrictions: No ? ? ? ?Therapy/Group: Individual Therapy ? ?[place] ?01/08/2022, 7:28 AM ?

## 2022-01-08 NOTE — Progress Notes (Addendum)
Speech Language Pathology Daily Session Note ? ?Patient Details  ?Name: Patricia Dudley ?MRN: [id number] ?Date of Birth: 05/25/1930 ? ?Today's Date: 01/08/2022 ?SLP Individual Time: 5830-9407 ?SLP Individual Time Calculation (min): 56 min ? ?Short Term Goals: ?Week 1: SLP Short Term Goal 1 (Week 1): Pt will follow 3 commands within a functional context given Max multimodal A. - Followed 3 out of 3 verbal commands during oral care given Max multimodal A and extra processing time. ? ?SLP Short Term Goal 2 (Week 1): Pt will participate in MBSS to determine safety diet consistencies with 100% completion. - Scheduled for 11/08/2021 ? ?SLP Short Term Goal 3 (Week 1): Pt will communicate wants, needs, ideas, and thoughts x 1 given Total A for use of mulitmodal communication methods. - x 7 via yes/no questions, repetition, and extra processing time and Max multimodal A. ? ?SLP Short Term Goal 4 (Week 1): Pt will maintain focused attention for 2 minutes with Mod A. - 2 minutes with PO intake; after PO intake 1 minute or less of focused attention. ? ?SLP Short Term Goal 5 (Week 1): Pt will participate in ongoing cognitive-linguistic evlauation to further determine ST POC and aid in dc planning. -Pt with minimal verbalizations this date; stated "bye" on command and inconsistently answered yes/no questions re: current needs and likes.  ? ?Skilled Therapeutic Interventions: ?Pt seen this date for skilled ST intervention targeting dysphagia and communication goals outlined above. Pt received awake, though lethargic, sitting upright in bed and receiving Total A from NT for PO consumption. Repositioned + 2 to optimize positioning for PO intake, given pt was slouched in bed. Agreeable to ST intervention. ? ?SLP facilitated today's session by providing the following skilled interventions within the context of functional tx tasks: extra processing time, supported communication interventions (simple language, written + visual choice  prompts, yes/no + close-ended vs open-ended questions, and use of gestures), Total A for adherence to aspiration precautions, and Max multimodal A for functional communication. No clinical s/sx concerning for aspiration with recommended diet of Dysphagia 1 + nectar-thick liquids. Will plan for MBSS next date to assess biomechanics of swallow function, and safety for thin liquids. ? ?Pt inconsistently answered yes/no questions via head gestures re: personal cares/needs when provided with Max multimodal A to include gaining her attention first, SLP repositioning to promote alertness, repeating requests, model prompts, providing visual supports/aids, and extra processing time. Did not communicate verbally nor hand gestures this date. Please see above for objective data re: pt's performance. Sign posted at [facility] for staff education re: communication strategies. ? ?Pt left in bed with bed alarm on, call bell reviewed and within reach and all immediat need . Continue per current ST POC. ? ?Pain ?No pain reported, though pt with minimal verbalizations; NAD appreciated.  ? ?Therapy/Group: Individual Therapy ? ?Vy Badley A Brittnay Pigman ?01/08/2022, 12:07 PM ?

## 2022-01-09 ENCOUNTER — Inpatient Hospital Stay (HOSPITAL_COMMUNITY): Payer: Medicare PPO

## 2022-01-09 DIAGNOSIS — D500 Iron deficiency anemia secondary to blood loss (chronic): Secondary | ICD-10-CM

## 2022-01-09 DIAGNOSIS — I950 Idiopathic hypotension: Secondary | ICD-10-CM

## 2022-01-09 DIAGNOSIS — N184 Chronic kidney disease, stage 4 (severe): Secondary | ICD-10-CM

## 2022-01-09 LAB — CBC WITH DIFFERENTIAL/PLATELET
Abs Immature Granulocytes: 0.18 10*3/uL — ABNORMAL HIGH (ref 0.00–0.07)
Basophils Absolute: 0.1 10*3/uL (ref 0.0–0.1)
Basophils Relative: 0 %
Eosinophils Absolute: 1 10*3/uL — ABNORMAL HIGH (ref 0.0–0.5)
Eosinophils Relative: 8 %
HCT: 30.7 % — ABNORMAL LOW (ref 36.0–46.0)
Hemoglobin: 9.7 g/dL — ABNORMAL LOW (ref 12.0–15.0)
Immature Granulocytes: 2 %
Lymphocytes Relative: 23 %
Lymphs Abs: 2.8 10*3/uL (ref 0.7–4.0)
MCH: 27.8 pg (ref 26.0–34.0)
MCHC: 31.6 g/dL (ref 30.0–36.0)
MCV: 88 fL (ref 80.0–100.0)
Monocytes Absolute: 0.9 10*3/uL (ref 0.1–1.0)
Monocytes Relative: 7 %
Neutro Abs: 7.5 10*3/uL (ref 1.7–7.7)
Neutrophils Relative %: 60 %
Platelets: 400 10*3/uL (ref 150–400)
RBC: 3.49 MIL/uL — ABNORMAL LOW (ref 3.87–5.11)
RDW: 15.2 % (ref 11.5–15.5)
WBC: 12.4 10*3/uL — ABNORMAL HIGH (ref 4.0–10.5)
nRBC: 0.2 % (ref 0.0–0.2)

## 2022-01-09 LAB — BASIC METABOLIC PANEL
Anion gap: 10 (ref 5–15)
BUN: 47 mg/dL — ABNORMAL HIGH (ref 8–23)
CO2: 19 mmol/L — ABNORMAL LOW (ref 22–32)
Calcium: 8.6 mg/dL — ABNORMAL LOW (ref 8.9–10.3)
Chloride: 113 mmol/L — ABNORMAL HIGH (ref 98–111)
Creatinine, Ser: 2.69 mg/dL — ABNORMAL HIGH (ref 0.44–1.00)
GFR, Estimated: 16 mL/min — ABNORMAL LOW (ref >60)
Glucose, Bld: 183 mg/dL — ABNORMAL HIGH (ref 70–99)
Potassium: 4.5 mmol/L (ref 3.5–5.1)
Sodium: 142 mmol/L (ref 135–145)

## 2022-01-09 LAB — GLUCOSE, CAPILLARY
Glucose-Capillary: 172 mg/dL — ABNORMAL HIGH (ref 70–99)
Glucose-Capillary: 261 mg/dL — ABNORMAL HIGH (ref 70–99)
Glucose-Capillary: 152 mg/dL — ABNORMAL HIGH (ref 70–99)
Glucose-Capillary: 257 mg/dL — ABNORMAL HIGH (ref 70–99)

## 2022-01-09 MED ORDER — GLIPIZIDE 5 MG PO TABS
2.5000 mg | ORAL_TABLET | Freq: Every day | ORAL | Status: DC
  Administered 2022-01-09 – 2022-01-10 (×2): 2.5 mg via ORAL
  Filled 2022-01-09 (×2): qty 1

## 2022-01-09 MED ORDER — SODIUM CHLORIDE 0.9 % IV SOLN: Freq: Once | INTRAVENOUS | Status: AC

## 2022-01-09 NOTE — Progress Notes (Signed)
Modified Barium Swallow Progress Note ? ?Patient Details  ?Name: Patricia Dudley ?MRN: [id number] ?Date of Birth: 10-31-1929 ? ?Today's Date: 01/09/2022 ? ?Modified Barium Swallow completed.  Full report located under Chart Review in the Imaging Section. ? ?Brief recommendations include the following: ? ?Clinical Impression ?MBSS completed. Per instrumental findings, pt presents with moderate oropharyngeal dysphagia which appears to result from poor motor planning and cognitive impairment.  Oral phase c/b generalized lingual weakness resulting in lingual pumping and tremors, decreased bolus cohesion, incomplete A-P transit, prolonged oral manipulation of solid textures, and premature spillage over BOT into the pharyngeal space prior to swallow initiation. Pharyngeal phase c/b inconsistent swallow initiation, decreased airway protection/laryngeal vestibule closure, and intermittent/inconsistent minimal residuals in the vallecular space post-swallow.  ? ?Aforementioned findings resulted in deep penetration to the true vocal folds and silent aspiration (PAS 8) of thin liquid via tsp and cup sips. Despite Max multimodal A, pt unable to produce volitional cough to clear aspirated material. Unable to consistently follow commands to perform postural strategies to assess effectiveness of other interventions for tolerance of thin liquids. Penetration with resultant silent aspiration of nectar-thick liquid via straw appreciated immediately following bites of puree. This  likely occurred secondary to poor motor planning and flexibility resulting in mistiming/incoordination of swallow-breathe sequence. No penetration nor aspiration appreciated with nectar-thick liquid provided via spoon, cup, or straw in isolation, honey-thick liquid via cup, Dysphagia 1 (puree), or Dysphagia 2 (chopped).  ? ?Given objective findings, recommend Dysphagia 1 and nectar-thick liquid consistencies when textures are provided separately (all liquids  and then all solids - do not alternate bites and sips). Additionally, recommend OOB for all meals, sit upright, be provided full supervision and Total A as needed, eat only when alert and accepting, and be provided with oral care via suction toothbrush after each meal. If not tolerating from a clinical standpoint, may downgrade to honey-thick liquids. May upgrade solids clinically, as able. Results reviewed with pt, LPN, NT, and pt's daughter. Will continue to follow for dysphagia management.  ?  ?Swallow Evaluation Recommendations ? ? SLP Diet Recommendations: Dysphagia 1 (Puree) solids;No mixed consistencies;Nectar thick liquid ? ? Liquid Administration via: Spoon;Cup;Straw ? ? Medication Administration: Crushed with puree ? ? Compensations: Slow rate;Small sips/bites;Minimize environmental distractions;Other (Comment) (provide solids separately from liquids; do not alternate) ? ? Postural Changes: Remain semi-upright after after feeds/meals (Comment);Seated upright at 90 degrees ? ? Oral Care Recommendations: Oral care before and after PO;Oral care BID ? ? ?Cerys Winget A Nyaire Denbleyker ?01/09/2022,11:52 AM ?

## 2022-01-09 NOTE — Progress Notes (Signed)
Occupational Therapy Session Note ? ?Patient Details  ?Name: Patricia Dudley ?MRN: [id number] ?Date of Birth: [birth year] ? ?Today's Date: 01/09/2022 ?OT Individual Time: 2836-6294 ?OT Individual Time Calculation (min): 56 min  ? ? ?Short Term Goals: ?Week 1:  OT Short Term Goal 1 (Week 1): Pt will visually attend to R side of body with min A during functional task ?OT Short Term Goal 2 (Week 1): Pt will follow 1 step command in prep for ADL task, with use of multimodal cues and no more than min A ?OT Short Term Goal 3 (Week 1): Pt will roll either R or L during bed level toileting, with mod A and use of multimodal cues as needed ? ?Skilled Therapeutic Interventions/Progress Updates:  ?Skilled OT intervention completed with focus on toileting, ambulatory transfers, initiation with laundry task. Pt received upright in bed, with nursing present attempting to disconnect pt from IV. Upon attempting bed mobility for ease of accessing IV line, pt was found to have incontinent BM in sheets with pt's hands visibly soiled from scratching and suspected digging 2/2 discomfort. Requested nursing/MD look into way to make stool more solid to prevent UTI or ingested infecting resulting from self-feeding. Completed initial toileting at bed level with rolling R<>L with min A for pericare. ? ?Pt increasingly verbal this session, stating "need to go to bathroom" when asked, and also stating first name, when prompted by lab staff, however largely responded via head nods for yes/no questions. Continues to be impaired with motor planning with increased time needed for all movements, however improved initiation demonstrated this date during functional tasks and with one step command following.  ? ?Bed mobility with max A for trunk and BLEs. Initial max A for sitting balance fading to CGA with pt having 2 R lean LOB with mod A needed to correct. Transfers during session varied depending on task and fatigue level, including sit > stands at  min/mod A of 1 with 1 HHA vs RW, in room ambulatory transfer from EOB/w/c <> toilet with use of RW at min A +2 for safety, with 1st person providing stability at pelvic region, 2nd person mainly advancing RW and RLE as needed. Upon ambulatory transfer to bathroom, pt had recurrent BM without brief donned, unaware of BM trail made behind her. Min A of 1 for standing balance during pericare, and max A of 2nd person to complete pericare to ensure cleanliness. HOH assist provided for initiating hand placement on bar and for holding wash cloth to promote self-wiping. Applied skin cream and sacral dressing per nursing. Required series of sit <> stands on toilet during toileting/LB dressing. Able to self-initiate advancing LLE into brief/pant hole however ultimately requiring max A for cueing and physical facilitation. ? ?Seated at sink, with simple command, pt washed hands with min A for cues only, and dried with towel upon set up of paper towel. Oldest daughter present in room to review pt's PLOF and states that pt verbalizes more to her daughters than with staff. Pt participated in stripping the bed sheets without AD intermittently for seated and standing motor planning task. Min A needed for balance, with pt able to initiate pulling sheets however still requiring max A to complete. Pt was left upright in TIS chair, with daughter present, awaiting PT, with all needs met at end of session. ? ? ?Therapy Documentation ?Precautions:  ?Precautions ?Precautions: Fall ?Precaution Comments: aspiration precautions ?Restrictions ?Weight Bearing Restrictions: No ? ?Pain: Non-verbal but no signs of distress or pain ? ?  Therapy/Group: Individual Therapy ? ?[place] ?01/09/2022, 7:50 AM ?

## 2022-01-09 NOTE — Patient Care Conference (Signed)
Inpatient RehabilitationTeam Conference and Plan of Care Update ?Date: 01/09/2022   Time: 11:42 AM  ? ? ?Patient Name: Patricia Dudley      ?Medical Record Number: [id number]  ?Date of Birth: [birth year] ?Sex: Female         ?Room/Bed: 5T01X/7L39Q-30 ?Payor Info: Payor: [employer] MEDICARE / Plan: [employer] MEDICARE CHOICE PPO / Product Type: *No Product type* /   ? ?Admit Date/Time:  01/03/2022  2:52 PM ? ?Primary Diagnosis:  Acute embolic stroke ([place]) ? ?Hospital Problems: Principal Problem: ?  Acute embolic stroke [facility]) ? ? ? ?Expected Discharge Date: Expected Discharge Date: 01/30/22 ? ?Team Members Present: ?Physician leading conference: Dr. Leeroy Cha ?Social Worker Present: Ovidio Kin, LCSW ?Nurse Present: Dorien Chihuahua, RN ?PT Present: Becky Sax, PT ?OT Present: Jennefer Bravo, OT ?SLP Present: Helaine Chess, SLP ?PPS Coordinator present : Gunnar Fusi, SLP ? ?   Current Status/Progress Goal Weekly Team Focus  ?Bowel/Bladder ? ?   incontinent   continent   Toileting protocol  ?Swallow/Nutrition/ Hydration ? ? MBSS pending  Mod A for consuming least restrictive diet  Self-feeding as able and tolerance for recommended diet textures   ?ADL's ? ? Max to total A bathing/dressing, Min A +2 toileting, improves command following and initiation within a functional context like toileting and in the shower vs table top activities  mod A  one step command following, functional transfers, ADL retraining   ?Mobility ? ? bed mobility max A, sit<>stands max A with RW, stand<>pivot +2 assist  min/mod A  functional mobility/transfers, generalized strengthening and endurance, NMR, dynamic standing balance/coordination   ?Communication ? ? Max to Total A for auditory comprehension and verbal expression.  Max A for auditory comprehension, Total A for verbal expression  Use of multimodal means of communication   ?Safety/Cognition/ Behavioral Observations ? Max A for focused attention for 2 minutes  Mod A  Focused attention dueing  functional tasks   ?Pain ? ?   N/A        ?Skin ? ?   MASD   Healing   Cream for skin; keep dry  ? ? ?Discharge Planning:  ?Pt lives alone but 5 of her adult children rotate providing care to her on a daily basis. Other children periodically check in atleast 3xs per day to ensure they are both doing well.dtr [name]/HCPOA ([phone]) going to granddaughter's graduation 5/25-5/31 and will not be available.   ?Team Discussion: ?Patient with CKD; minimal po intake, IVF possible. MD adjusted medications.  ?Patient on target to meet rehab goals: ?Currently needs mod assist +2 for stand pivots due to motor planning and attention deficits. Requires max cues for activities and does better in a functional environment.  Needs max assist for bathing and dressing and min assist +2 for toileting. Able to feed self with supervision but requires set up of utensils and food with tactile cues. Follows commands better with contextual cues due to right inattention.Goals for discharge set for min - mod assist overall. ? ?*See Care Plan and progress notes for long and short-term goals.  ? ?Revisions to Treatment Plan:  ?Meals with all same consistency at a time before change to different; continue D1 , nectar diet ?  ?Teaching Needs: ?Safety, transfers, cues, medications, dietary modifications, etc  ?Current Barriers to Discharge: ?Decreased caregiver support ? ?Possible Resolutions to Barriers: ?Family education ?HH follow up services ?DME: pending ?  ? ? Medical Summary ?Current Status: impaired command following and initiation, hemoglobin decreased to 6.5, improved  to 8.7 with 1U PRBCs, history of GI bleed, protonix restarted ? Barriers to Discharge: Behavior;Medical stability ? Barriers to Discharge Comments: dementia, anemia, history of GI bleed, impaired motor planning, CKD, type 2 DM with hyerglycemia ?Possible Resolutions to [employer]: contiunue to monitor Hgb, 1 U PRBCs given this week, stool occult ordered, start  fluids tonight, d/c Ensure, start home glipizide adn titrate up ? ? ?Continued Need for Acute Rehabilitation Level of Care: The patient requires daily medical management by a physician with specialized training in physical medicine and rehabilitation for the following reasons: ?Direction of a multidisciplinary physical rehabilitation program to maximize functional independence : Yes ?Medical management of patient stability for increased activity during participation in an intensive rehabilitation regime.: Yes ?Analysis of laboratory values and/or radiology reports with any subsequent need for medication adjustment and/or medical intervention. : Yes ? ? ?I attest that I was present, lead the team conference, and concur with the assessment and plan of the team. ? ? ?Dorien Chihuahua B ?01/09/2022, 4:27 PM  ? ? ? ? ? ? ?

## 2022-01-09 NOTE — Progress Notes (Signed)
Inpatient Diabetes Program Recommendations ? ?AACE/ADA: New Consensus Statement on Inpatient Glycemic Control (2015) ? ?Target Ranges:  Prepandial:   less than 140 mg/dL ?     Peak postprandial:   less than 180 mg/dL (1-2 hours) ?     Critically ill patients:  140 - 180 mg/dL  ? ?Lab Results  ?Component Value Date  ? GLUCAP 172 (H) 01/09/2022  ? HGBA1C 6.5 (H) 12/25/2021  ? ? Latest Reference Range & Units 01/08/22 06:30 01/08/22 12:00 01/08/22 16:38 01/08/22 21:05 01/09/22 05:50  ?Glucose-Capillary 70 - 99 mg/dL 231 (H) 305 (H) 288 (H) 283 (H) 172 (H)  ?(H): Data is abnormally high ? ?Inpatient Diabetes Program Recommendations:   ? ?Post prandials elevated.  Please consider Novolog 3 units TID. ? ?Will continue to follow while inpatient. ? ?Thank you, ?Reche Dixon, MSN, CDCES ?Diabetes Coordinator ?Inpatient Diabetes Program ?[phone] (team pager from 8a-5p) ? ? ? ? ? ?

## 2022-01-09 NOTE — Plan of Care (Signed)
?  Problem: Consults ?Goal: RH STROKE PATIENT EDUCATION ?Description: See Patient Education module for education specifics  ?Outcome: Progressing ?  ?Problem: RH BOWEL ELIMINATION ?Goal: RH STG MANAGE BOWEL WITH ASSISTANCE ?Description: STG Manage Bowel with min Assistance. ?Outcome: Progressing ?  ?Problem: RH BLADDER ELIMINATION ?Goal: RH STG MANAGE BLADDER WITH ASSISTANCE ?Description: STG Manage Bladder With min Assistance ?Outcome: Progressing ?  ?Problem: RH SAFETY ?Goal: RH STG ADHERE TO SAFETY PRECAUTIONS W/ASSISTANCE/DEVICE ?Description: STG Adhere to Safety Precautions With cues Assistance/Device. ?Outcome: Progressing ?  ?Problem: RH PAIN MANAGEMENT ?Goal: RH STG PAIN MANAGED AT OR BELOW PT'S PAIN GOAL ?Description: At or below level 4 with prns ?Outcome: Progressing ?  ?Problem: RH KNOWLEDGE DEFICIT ?Goal: RH STG INCREASE KNOWLEDGE OF DIABETES ?Description: Patient and daughter will be able to manage DM with medications and dietary modifications using handouts and educational materials independently ?Outcome: Progressing ?Goal: RH STG INCREASE KNOWLEDGE OF DYSPHAGIA/FLUID INTAKE ?Description: Patient and dtr will be able to manage Dysphagia, medications and dietary modifications using handouts and educational materials independently ?Outcome: Progressing ?Goal: RH STG INCREASE KNOWLEGDE OF HYPERLIPIDEMIA ?Description: Patient and dtr will be able to manage HLD with medications and dietary modifications using handouts and educational materials independently ?Outcome: Progressing ?Goal: RH STG INCREASE KNOWLEDGE OF STROKE PROPHYLAXIS ?Description: Patient and dtr will be able to manage secondary stroke risks with medications and dietary modifications using handouts and educational materials independently ?Outcome: Progressing ?  ?Problem: RH KNOWLEDGE DEFICIT ?Goal: RH STG INCREASE KNOWLEDGE OF HYPERTENSION ?Description: Patient and dtr will be able to manage HTN with medications and dietary modifications  using handouts and educational materials independently ?Outcome: Progressing ?  ?Problem: RH Vision ?Goal: RH LTG Vision (Specify) ?Outcome: Progressing ?  ?

## 2022-01-09 NOTE — Progress Notes (Signed)
Speech Language Pathology Daily Session Note ? ?Patient Details  ?Name: Patricia Dudley ?MRN: [id number] ?Date of Birth: 05/29/1930 ? ?Today's Date: 01/09/2022 ?SLP Individual Time: 2761-8485 ?SLP Individual Time Calculation (min): 50 min ? ?Short Term Goals: ?Week 1: SLP Short Term Goal 1 (Week 1): Pt will follow 3 commands within a functional context given Max multimodal A. - In a familiar oral care routine via suction toothbrush, pt followed 3 oral motor commands given Sup A.  ? ?SLP Short Term Goal 2 (Week 1): Pt will participate in MBSS to determine safety diet consistencies with 100% completion. - Completed this AM; please see MBSS report for more details. GOAL MET ? ?SLP Short Term Goal 3 (Week 1): Pt will communicate wants, needs, ideas, and thoughts x 1 given Total A for use of mulitmodal communication methods. - x 5 given Max multimodal A via yes/no questions, visual choice prompts, and repetition.  ? ?SLP Short Term Goal 4 (Week 1): Pt will maintain focused attention for 2 minutes with Mod A. - Maintained focused attention for 2+ minutes with Min-Mod A during oral care routine. ? ?SLP Short Term Goal 5 (Week 1): Pt will participate in ongoing cognitive-linguistic evlauation to further determine ST POC and aid in dc planning. - Did not formally address this date.  ? ?Skilled Therapeutic Interventions: ?Pt seen this date for skilled ST intervention targeting dysphagia and communication goals outlined above. Pt received lethargic, lying in bed with eyes closed. Alerted to name. Repositioned + 2 to optimize positioning for PO intake, given pt was slouched in bed. Agreeable to ST intervention. ?  ?SLP facilitated today's session by continuing to provide the following skilled interventions within the context of functional tx tasks: extra processing time, supported communication interventions (simple language, written + visual choice prompts, yes/no + close-ended vs open-ended questions, and use of gestures),  Total A for adherence to aspiration precautions, and Max multimodal A for functional communication. No clinical s/sx concerning for aspiration with recommended textures; pt did self-feed ~20% of bites given Max A faded to Sup A. Continues to attend best when items are placed in her L field. ?  ?Pt continues to inconsistently answered yes/no questions via head gestures re: personal cares/needs when provided with Max multimodal A to include gaining her attention first, SLP repositioning to right side of bed to promote alertness, repeating requests, model prompts, providing visual supports/aids, and extra processing time. Communicated verbally x 3 this date. Please see above for objective data re: pt's performance. Signage updated re: safe swallowing and communication strategies. Please note, pt fatigued by the end of today's session and fell asleep; therefore, missed 10 minutes of today's session.  ?  ?Pt left in bed with bed alarm on, call bell reviewed and within reach and all immediate need . Continue per current ST POC. ? ?Pain ?No pain reported, though difficult to fully assess; NAD appreciated.  ? ?Therapy/Group: Individual Therapy ? ?Sanvika Cuttino A Elnathan Fulford ?01/09/2022, 11:10 AM ?

## 2022-01-09 NOTE — Progress Notes (Signed)
Physical Therapy Session Note ? ?Patient Details  ?Name: Patricia Dudley ?MRN: [id number] ?Date of Birth: 1930-03-18 ? ?Today's Date: 01/09/2022 ?PT Individual Time: 0100-7121 ?PT Individual Time Calculation (min): 55 min  ? ?Short Term Goals: ?Week 1:  PT Short Term Goal 1 (Week 1): Pt will roll side to side w/ max A ?PT Short Term Goal 2 (Week 1): Pt will transfer sup to sit w/ max A ?PT Short Term Goal 3 (Week 1): Pt will transfer sit to stand w/ max A ?PT Short Term Goal 4 (Week 1): Pt will amb w/ LRAD x 10'. ? ?Skilled Therapeutic Interventions/Progress Updates:  ? Received pt sitting in TIS [facility] with daughter present at bedside. Pt remains mostly non-verbal, but was more alert and engaging smiling and shaking head yes/no during session. Session with emphasis on functional mobility/transfers, attention/initiation, dynamic standing balance/coordination, stair navigation, and gait training. Pt transported to/from room in TIS WC dependently. Sit<>stand at staircase with mod A +2 and assist to place UE on railing to initiate movement. Pt navigated 4 steps with 2 handrails and mod/max A of 2. Pt required manual facilitation to advance RLE to step but was able to initiate some hip flexion. Pt also required manual faciliation to move UE along railing as well as max cues for sequencing (specifically when turning). Sit<>stand with RW and max A of 1 and pt ambulated 53ft x 2 trials with RW and min/mod A +2 (increased assist when turning to sit) but pt able to initiate sitting down without assist today. Pt required manual facilitation to steer RW and assist to advance RLE with increased fatigue. Pt able to follow commands more frequently and with less time today > yesterday and actually able to motor plan/sequencing giving rehab tech a high five after walking. Worked on dynamic sitting balance, core control, and attention/initiation grabbing horseshoes and tossing them onto target. Pt able to initiate grabbing horseshoe  from therapist using LUE but only able to let go of horseshoe 2/6 trials due to difficulty motor planning, requiring hand over hand guidance/assist and max cues. Returned to room and pt left sitting in TIS WC, needs within reach, and seatbelt alarm on. Additional family members arrived at pt's bedside.  ? ?Therapy Documentation ?Precautions:  ?Precautions ?Precautions: Fall ?Precaution Comments: aspiration precautions ?Restrictions ?Weight Bearing Restrictions: No ? ?Therapy/Group: Individual Therapy ?Blenda Nicely ?Becky Sax PT, DPT  ?01/09/2022, 7:17 AM  ?

## 2022-01-09 NOTE — Progress Notes (Signed)
?                                                       PROGRESS NOTE ? ? ?Subjective/Complaints: ?CBGs 172-305: will restart hoe glipizide and titrate up as tolerated ?Not tolerating thins with food on MBS as per SLP ? ?ROS: Limited by language, unable to answer commands. ? ?Objective: ?  ?No results found. ?Recent Labs  ?  01/07/22 ?0748 01/08/22 ?0901  ?WBC 16.8* 14.2*  ?HGB 6.6* 8.7*  ?HCT 21.0* 25.3*  ?PLT 270 295  ? ?Recent Labs  ?  01/07/22 ?3557  ?NA 140  ?K 4.7  ?CL 112*  ?CO2 23  ?GLUCOSE 185*  ?BUN 60*  ?CREATININE 2.78*  ?CALCIUM 7.5*  ? ? ?Intake/Output Summary (Last 24 hours) at 01/09/2022 1200 ?Last data filed at 01/09/2022 0951 ?Gross per 24 hour  ?Intake 1545.78 ml  ?Output --  ?Net 1545.78 ml  ?  ? ?  ? ?Physical Exam: ?Vital Signs ?Blood pressure (!) 88/69, pulse 92, temperature 98.3 ?F (36.8 ?C), resp. rate 15, height 5\' 5"  (1.651 m), weight 55.8 kg, SpO2 100 %. ? ?Gen: no distress, normal appearing, BMI 20.47 ?HENT: oral mucosa pink and moist, NCAT ?Eyes:  ?   General:     ?   Right eye: No discharge.     ?   Left eye: No discharge.  ?   Comments: Left lid closed ?R eye lid more open  ?Mouth: denitrues in place ?Cardiovascular:  ?   Rate and Rhythm: irregular, a fib, had episodes of bradycardia ?   Heart sounds: Normal heart sounds. No murmur heard. ?  No gallop.  ?Pulmonary:  ?   Effort: Pulmonary effort is normal. No respiratory distress.  ?   Breath sounds: Normal breath sounds. No wheezing or rales.  ?Abdominal:  ?   General: Bowel sounds are normal. There is no distension.  ?   Palpations: Abdomen is soft.  ?   Tenderness: There is no abdominal tenderness.  ?Musculoskeletal:  ?   Cervical back: Neck supple.  ?   Comments: Limited participation in movement today, squeeze hand: L grip/biceps 5-/5 BUE: 3/5 grip/biceps.  Would not participate in RLE and LLE movement. ?Skin: ?   General: Skin is warm and dry.  ?Neurological:  ?   Mental Status: She is alert.  ?   Comments: Squeezes hands on  command, did protrude tongue on command today, made attempt to smile when asked. ?No verbal expression shaking head yes or no-  ?Was able to remain awake and alert during exam today. ?+echolalia ?Psychiatric:  ?   Comments: Smiling,  +echolalia ? ?Assessment/Plan: ?1. Functional deficits which require 3+ hours per day of interdisciplinary therapy in a comprehensive inpatient rehab setting. ?Physiatrist is providing close team supervision and 24 hour management of active medical problems listed below. ?Physiatrist and rehab team continue to assess barriers to discharge/monitor patient progress toward functional and medical goals ? ?Care Tool: ? ?Bathing ?   ?Body parts bathed by patient: Left lower leg, Right lower leg, Left upper leg, Right upper leg, Front perineal area  ? Body parts bathed by helper: Right arm, Left arm, Chest, Abdomen, Buttocks, Face ?Body parts n/a: Front perineal area, Buttocks ?  ?Bathing assist Assist Level: Total Assistance - Patient < 25% ?  ?  ?  Upper Body Dressing/Undressing ?Upper body dressing   ?What is the patient wearing?: Pull over shirt ?   ?Upper body assist Assist Level: Total Assistance - Patient < 25% ?   ?Lower Body Dressing/Undressing ?Lower body dressing ? ? ?   ?What is the patient wearing?: Incontinence brief, Pants ? ?  ? ?Lower body assist Assist for lower body dressing: Total Assistance - Patient < 25% (sit > stand) ?   ? ?Toileting ?Toileting    ?Toileting assist Assist for toileting: Total Assistance - Patient < 25% ?  ?  ?Transfers ?Chair/bed transfer ? ?Transfers assist ?   ? ?Chair/bed transfer assist level: 2 Helpers ?  ?  ?Locomotion ?Ambulation ? ? ?Ambulation assist ? ? Ambulation activity did not occur: Safety/medical concerns ? ?  ?  ?   ? ?Walk 10 feet activity ? ? ?Assist ? Walk 10 feet activity did not occur: Safety/medical concerns ? ?  ?   ? ?Walk 50 feet activity ? ? ?Assist Walk 50 feet with 2 turns activity did not occur: Safety/medical concerns ? ?  ?    ? ? ?Walk 150 feet activity ? ? ?Assist Walk 150 feet activity did not occur: Safety/medical concerns ? ?  ?  ?  ? ?Walk 10 feet on uneven surface  ?activity ? ? ?Assist Walk 10 feet on uneven surfaces activity did not occur: Safety/medical concerns ? ? ?  ?   ? ?Wheelchair ? ? ? ? ?Assist Is the patient using a wheelchair?: No ?  ?Wheelchair activity did not occur: Safety/medical concerns ? ?  ?   ? ? ?Wheelchair 50 feet with 2 turns activity ? ? ? ?Assist ? ?  ?Wheelchair 50 feet with 2 turns activity did not occur: Safety/medical concerns ? ? ?   ? ?Wheelchair 150 feet activity  ? ? ? ?Assist ? Wheelchair 150 feet activity did not occur: Safety/medical concerns ? ? ?   ? ?Blood pressure (!) 88/69, pulse 92, temperature 98.3 ?F (36.8 ?C), resp. rate 15, height 5\' 5"  (1.651 m), weight 55.8 kg, SpO2 100 %. ? ? ? Medical Problem List and Plan: ?1. 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 ?            -patient may  shower ?            -ELOS/Goals: ~ 3 weeks- min A ? -Continue CIR ? -Interdisciplinary Team Conference today   ?2.  Anemia: resume Heparin [place] TID. Transfuse 1U on 5/15.  ?            -antiplatelet therapy: aspirin 81 mg daily ?3. Pain Management: Tylenol prn ?4. Mood: LCSW to evaluate and provide emotional support ?            -antipsychotic agents: n/a ?5. Neuropsych: This patient is not capable of making decisions on her own behalf. ?6. Skin/Wound Care: Routine skin care checks ?7. Dysphagia ?            -- continue Dysphagia 1 diet with thin liquids.  Crushed meds with applesauce.  SLP evaluation. ?            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 ? ?  01/09/2022  ?  3:37 AM 01/08/2022  ?  8:00 PM 01/08/2022  ?  1:21 PM  ?Vitals with BMI   ?Systolic 88 884 166  ?Diastolic 69 73 66  ?Pulse 92 103 82  ?  ?9: Diabetes mellitus: CBGs qAC,qHS; monitor intake and activity ?--continue SSI ?--started home glipizide at 2.5mg  daily ?D/c Ensure between meals ?10: Hypertension: increase lisinopril to 40 mg, amlodipine 10 mg, Lopressor. Repeat creatinine on Monday given increase in Lisinopril.  ?5/13 BP controlled today. ?5/14 some variability in B/P, with mild elevation, some episodes of bradycardia (HR 46 at 051 today). ?-D/c lisinopril today since BUN 28->68 and Crt 2.28->3.20, with increase in past 2 days.  Hold lopressor with bradycardia episodes.  Will see how B/P trends on amlodipine 10 mg, may consider adding hydralazine if B/p is elevated.   ?-now hypotensive, change fluids to NS HS 12 hours ? ? ? ?  01/09/2022  ?  3:37 AM 01/08/2022  ?  8:00 PM 01/08/2022  ?  1:21 PM  ?Vitals with BMI  ?Systolic 88 063 016  ?Diastolic 69 73 66  ?Pulse 92 103 82  ?  ?11: Hyperlipidemia: continue rosuvastatin 10 mg ?12: Moderate malnutrition: continue supplements ?13: Chronic kidney disease stage 3b: follow-up BMP ?    -5/13 B/P is in the 120's today. Pt had lisinopril increased to 40 mg yesterday for elevated B/P.  Since lisinopril may increase Crt, decrease lisinopril to 20 mg daily today and push po intake and repeat BMP. ?-5/14 Crt up to 3.2 and BUN up to 68.  Fluid bolus (1L NS) and continuous fluid (0.45 NS at 75 ml/hr) given for rise in BUN and Crt. ?-d/c lisinopril (per Dr. Christella Noa) since could be a possible cause of elevated Crt . ?-Repeat creatinine today ? ? ?  Latest Ref Rng & Units 01/07/2022  ?  7:48 AM 01/06/2022  ?  5:53 AM 01/04/2022  ?  5:58 AM  ?BMP  ?Glucose 70 - 99 mg/dL 185   191   214    ?BUN 8 - 23 mg/dL 60   68   28    ?Creatinine 0.44 - 1.00 mg/dL 2.78   3.20   2.28    ?Sodium 135 - 145 mmol/L 140   144   142    ?Potassium 3.5 - 5.1 mmol/L 4.7   4.7   4.7    ?Chloride 98 - 111 mmol/L 112   110   110    ?CO2 22 - 32 mmol/L 23   25   26     ?Calcium  8.9 - 10.3 mg/dL 7.5   8.1   8.9    ?  ?14: Glaucoma: continue latanoprost drops ?15: Hypothyroidism: continue Synthroid ?16: Diarrhea/loose stools: monitor; getting loperamide 2 mg daily (BID prn) ?-5/13 Cornelius Moras

## 2022-01-10 LAB — GLUCOSE, CAPILLARY
Glucose-Capillary: 187 mg/dL — ABNORMAL HIGH (ref 70–99)
Glucose-Capillary: 240 mg/dL — ABNORMAL HIGH (ref 70–99)
Glucose-Capillary: 253 mg/dL — ABNORMAL HIGH (ref 70–99)
Glucose-Capillary: 281 mg/dL — ABNORMAL HIGH (ref 70–99)

## 2022-01-10 MED ORDER — CALCIUM POLYCARBOPHIL 625 MG PO TABS
625.0000 mg | ORAL_TABLET | Freq: Every day | ORAL | Status: DC
  Administered 2022-01-10 – 2022-01-31 (×22): 625 mg via ORAL
  Filled 2022-01-10 (×22): qty 1

## 2022-01-10 MED ORDER — GLIPIZIDE 5 MG PO TABS
5.0000 mg | ORAL_TABLET | Freq: Every day | ORAL | Status: DC
  Administered 2022-01-11: 5 mg via ORAL
  Filled 2022-01-10: qty 1

## 2022-01-10 NOTE — Progress Notes (Signed)
PROGRESS NOTE   Subjective/Complaints: Sleepy this morning Does not demonstrate pain Transferring to bathroom with Stedy.  Continent of bladder at times  ROS: limited by language, unable to answer commands.  Objective:   DG Swallowing Func-Speech Pathology  Result Date: 01/09/2022 Table formatting from the original result was not included. Objective Swallowing Evaluation: Type of Study: MBS-Modified Barium Swallow Study  Patient Details Name: Patricia Dudley MRN: [id number] Date of Birth: 05-07-1930 Today's Date: 01/09/2022 Time: SLP Start Time (ACUTE ONLY): 1555 -SLP Stop Time (ACUTE ONLY): 1610 SLP Time Calculation (min) (ACUTE ONLY): 15 min Past Medical History: Past Medical History: Diagnosis Date  A-fib ([place])   CVA (cerebral vascular accident) ([place])   Dementia ([place])   Diabetes mellitus without complication ([place])   GI bleed   Hyperlipemia   Hypertension   Thyroid disease  Past Surgical History: Past Surgical History: Procedure Laterality Date  COLONOSCOPY N/A 05/16/2017  Procedure: COLONOSCOPY;  Surgeon: Lin Landsman, MD;  Location: [place];  Service: Gastroenterology;  Laterality: N/A;  ESOPHAGOGASTRODUODENOSCOPY N/A 08/08/2016  Procedure: ESOPHAGOGASTRODUODENOSCOPY (EGD);  Surgeon: [name], MD;  Location: [facility] ENDOSCOPY;  Service: Endoscopy;  Laterality: N/A;  ESOPHAGOGASTRODUODENOSCOPY N/A 05/16/2017  Procedure: ESOPHAGOGASTRODUODENOSCOPY (EGD);  Surgeon: Lin Landsman, MD;  Location: [facility] ENDOSCOPY;  Service: Gastroenterology;  Laterality: N/A;  ESOPHAGOGASTRODUODENOSCOPY (EGD) WITH PROPOFOL N/A 02/28/2017  Procedure: ESOPHAGOGASTRODUODENOSCOPY (EGD) WITH PROPOFOL;  Surgeon: [name], MD;  Location: [facility] ENDOSCOPY;  Service: Endoscopy;  Laterality: N/A;  EYE SURGERY    left HPI: Patricia Dudley is a 88 y.o. female who presented to [facility] ED via EMS for aphasia, left gaze and slurred speech. Pt with recent  episondes of vomiting at home and in ED. MRI 5/2: "positive for acute small vessel infarct at the left internal capsule." CXR 5/1: "Mild airspace disease at the left base, favor atelectasis."  Pt  with past medical history of HTN, HLD, A fib not on AC, GI bleed, dementia, CVA, hypothyroidism and DM.  Subjective: Family present for assessment- daughter [name]  Recommendations for follow up therapy are one component of a multi-disciplinary discharge planning process, led by the attending physician.  Recommendations may be updated based on patient status, additional functional criteria and insurance authorization. Assessment / Plan / Recommendation   01/09/2022  11:33 AM Clinical Impressions Clinical Impressions SLP Visit Diagnosis MBSS completed. Per instrumental findings, pt presents with moderate oropharyngeal dysphagia which appears to result from poor motor planning and cognitive impairment.  Oral phase c/b generalized lingual weakness resulting in lingual pumping and tremors, decreased bolus cohesion, incomplete A-P transit, prolonged oral manipulation of solid textures, and premature spillage over BOT into the pharyngeal space prior to swallow initiation. Pharyngeal phase c/b inconsistent swallow initiation, decreased airway protection/laryngeal vestibule closure, and intermittent/inconsistent minimal residuals in the vallecular space post-swallow. Aforementioned findings resulted in deep penetration to the true vocal folds and silent aspiration (PAS 8) of thin liquid via tsp and cup sips. Despite Max multimodal A, pt unable to produce volitional cough to clear aspirated material. Unable to consistently follow commands to perform postural strategies to assess effectiveness of other interventions for tolerance of thin liquids. Penetration with resultant silent aspiration of  nectar-thick liquid via straw appreciated immediately following bites of puree. This  likely occurred secondary to poor motor planning and  flexibility resulting in mistiming/incoordination of swallow-breathe sequence. No penetration nor aspiration appreciated with nectar-thick liquid provided via spoon, cup, or straw in isolation, honey-thick liquid via cup, Dysphagia 1 (puree), or Dysphagia 2 (chopped). Given objective findings, recommend Dysphagia 1 (puree) and nectar-thick liquid consistencies when textures are provided separately (all liquids and then all solids - do not alternate bites and sips). Additionally, recommend OOB for all meals, sit upright, be provided full supervision and Total A as needed, eat only when alert and accepting, and be provided with oral care via suction toothbrush after each meal. If pt does not demonstrate clinical tolerance, recommend initiation of dysphagia 1 with honey-thick liquid via cup. Results reviewed with pt, LPN, NT, and pt's daughter. Will continue to follow for dysphagia management. Dysphagia, oropharyngeal phase (R13.12) Impact on safety and function Moderate aspiration risk;Risk for inadequate nutrition/hydration     12/25/2021   9:58 AM Treatment Recommendations Treatment Recommendations Therapy as outlined in treatment plan below     01/09/2022  11:36 AM Prognosis Prognosis for Safe Diet Advancement Fair Barriers to Reach Goals Cognitive deficits;Severity of deficits;Language deficits   01/09/2022  11:33 AM Diet Recommendations SLP Diet Recommendations Dysphagia 1 (Puree) solids;No mixed consistencies;Nectar thick liquid Liquid Administration via Spoon;Cup;Straw Medication Administration Crushed with puree Compensations Slow rate;Small sips/bites;Minimize environmental distractions;Other (Comment) Postural Changes Remain semi-upright after after feeds/meals (Comment);Seated upright at 90 degrees     01/09/2022  11:33 AM Other Recommendations Oral Care Recommendations Oral care before and after PO;Oral care BID   01/09/2022  11:33 AM Frequency and Duration  Treatment Duration 2 weeks     01/09/2022  11:37 AM  Oral Phase Oral - Honey Teaspoon NT Oral - Honey Cup Lingual pumping;Piecemeal swallowing;Delayed oral transit;Decreased bolus cohesion;Premature spillage;Weak lingual manipulation Oral - Nectar Teaspoon Weak lingual manipulation;Lingual pumping;Piecemeal swallowing;Decreased bolus cohesion;Premature spillage Oral - Nectar Cup Weak lingual manipulation;Lingual pumping;Piecemeal swallowing;Decreased bolus cohesion;Premature spillage Oral - Nectar Straw Lingual pumping;Weak lingual manipulation;Piecemeal swallowing;Decreased bolus cohesion;Premature spillage Oral - Thin Teaspoon Weak lingual manipulation;Lingual pumping;Decreased bolus cohesion;Piecemeal swallowing;Premature spillage Oral - Thin Cup Lingual pumping;Weak lingual manipulation;Piecemeal swallowing;Decreased bolus cohesion;Premature spillage Oral - Thin Straw Weak lingual manipulation;Lingual pumping;Piecemeal swallowing;Decreased bolus cohesion;Premature spillage Oral - Puree Weak lingual manipulation;Decreased bolus cohesion;Delayed oral transit;Piecemeal swallowing Oral - Mech Soft Lingual pumping;Weak lingual manipulation;Piecemeal swallowing;Delayed oral transit;Premature spillage;Decreased bolus cohesion Oral - Regular NT Oral - Multi-Consistency NT Oral - Pill NT    01/09/2022  11:40 AM Pharyngeal Phase Pharyngeal Phase Impaired Pharyngeal- Honey Teaspoon NT Pharyngeal- Honey Cup Delayed swallow initiation-pyriform sinuses;Pharyngeal residue - valleculae Pharyngeal Material does not enter airway Pharyngeal- Nectar Teaspoon Delayed swallow initiation-pyriform sinuses;Delayed swallow initiation-vallecula;Penetration/Aspiration during swallow Pharyngeal Material does not enter airway Pharyngeal- Nectar Cup Delayed swallow initiation-vallecula;Delayed swallow initiation-pyriform sinuses Pharyngeal- Nectar Straw Delayed swallow initiation-vallecula;Delayed swallow initiation-pyriform sinuses;Reduced airway/laryngeal closure;Trace  aspiration;Penetration/Aspiration during swallow Pharyngeal Material enters airway, passes BELOW cords without attempt by patient to eject out (silent aspiration) Pharyngeal- Thin Teaspoon Delayed swallow initiation-vallecula;Delayed swallow initiation-pyriform sinuses;Reduced airway/laryngeal closure;Penetration/Aspiration during swallow;Penetration/Aspiration before swallow;Trace aspiration Pharyngeal Material enters airway, passes BELOW cords without attempt by patient to eject out (silent aspiration) Pharyngeal- Thin Cup Delayed swallow initiation-vallecula;Delayed swallow initiation-pyriform sinuses;Reduced airway/laryngeal closure;Penetration/Aspiration during swallow;Penetration/Aspiration before swallow;Trace aspiration Pharyngeal Material enters airway, passes BELOW cords without attempt by patient to eject out (silent aspiration) Pharyngeal- Thin Straw NT Pharyngeal- Puree Delayed swallow initiation-vallecula Pharyngeal Material does not enter airway  Pharyngeal- Mechanical Soft Delayed swallow initiation-pyriform sinuses Pharyngeal Material does not enter airway Pharyngeal- Regular NT Pharyngeal- Multi-consistency NT Pharyngeal- Pill NT    01/09/2022  11:32 AM Cervical Esophageal Phase  Cervical Esophageal Phase WFL Bethany A Lutes 01/09/2022, 12:22 PM                     Recent Labs    01/08/22 0901 01/09/22 1325  WBC 14.2* 12.4*  HGB 8.7* 9.7*  HCT 25.3* 30.7*  PLT 295 400   Recent Labs    01/09/22 1325  NA 142  K 4.5  CL 113*  CO2 19*  GLUCOSE 183*  BUN 47*  CREATININE 2.69*  CALCIUM 8.6*    Intake/Output Summary (Last 24 hours) at 01/10/2022 1229 Last data filed at 01/10/2022 2376 Gross per 24 hour  Intake 354 ml  Output --  Net 354 ml        Physical Exam: Vital Signs Blood pressure (!) 145/53, pulse 81, temperature 98.1 F (36.7 C), temperature source Oral, resp. rate 18, height 5\' 5"  (1.651 m), weight 55.8 kg, SpO2 100 %.  Gen: no distress, normal appearing, BMI  20.47 HENT: oral mucosa pink and moist, NCAT Eyes:     General:        Right eye: No discharge.        Left eye: No discharge.     Comments: Left lid closed R eye lid more open  Mouth: denitrues in place Cardiovascular:     Rate and Rhythm: irregular, a fib, had episodes of bradycardia    Heart sounds: Normal heart sounds. No murmur heard.   No gallop.  Pulmonary:     Effort: Pulmonary effort is normal. No respiratory distress.     Breath sounds: Normal breath sounds. No wheezing or rales.  Abdominal:     General: Bowel sounds are normal. There is no distension.     Palpations: Abdomen is soft.     Tenderness: There is no abdominal tenderness.  Musculoskeletal:     Cervical back: Neck supple.     Comments: Limited participation in movement today, squeeze hand: L grip/biceps 5-/5 BUE: 3/5 grip/biceps.  Would not participate in RLE and LLE movement. Skin:    General: Skin is warm and dry.  Neurological:     Mental Status: She is alert.     Comments: Squeezes hands on command, did protrude tongue on command today, made attempt to smile when asked. No verbal expression shaking head yes or no-  Was able to remain awake and alert during exam today. +echolalia Psychiatric:     Comments: Smiling,  +echolalia GU: continent of bladder at times  Assessment/Plan: 1. Functional deficits which require 3+ hours per day of interdisciplinary therapy in a comprehensive inpatient rehab setting. Physiatrist is providing close team supervision and 24 hour management of active medical problems listed below. Physiatrist and rehab team continue to assess barriers to discharge/monitor patient progress toward functional and medical goals  Care Tool:  Bathing    Body parts bathed by patient: Left lower leg, Right lower leg, Left upper leg, Right upper leg, Front perineal area   Body parts bathed by helper: Right arm, Left arm, Chest, Abdomen, Buttocks, Face Body parts n/a: Front perineal area,  Buttocks   Bathing assist Assist Level: Total Assistance - Patient < 25%     Upper Body Dressing/Undressing Upper body dressing   What is the patient wearing?: Pull over shirt    Upper body assist  Assist Level: Total Assistance - Patient < 25%    Lower Body Dressing/Undressing Lower body dressing      What is the patient wearing?: Incontinence brief, Pants     Lower body assist Assist for lower body dressing: Total Assistance - Patient < 25%     Toileting Toileting    Toileting assist Assist for toileting: Total Assistance - Patient < 25%     Transfers Chair/bed transfer  Transfers assist     Chair/bed transfer assist level: Moderate Assistance - Patient 50 - 74%     Locomotion Ambulation   Ambulation assist   Ambulation activity did not occur: Safety/medical concerns  Assist level: 2 helpers Assistive device: Walker-rolling Max distance: 29ft   Walk 10 feet activity   Assist  Walk 10 feet activity did not occur: Safety/medical concerns  Assist level: 2 helpers Assistive device: Walker-rolling   Walk 50 feet activity   Assist Walk 50 feet with 2 turns activity did not occur: Safety/medical concerns         Walk 150 feet activity   Assist Walk 150 feet activity did not occur: Safety/medical concerns         Walk 10 feet on uneven surface  activity   Assist Walk 10 feet on uneven surfaces activity did not occur: Safety/medical concerns         Wheelchair     Assist Is the patient using a wheelchair?: No   Wheelchair activity did not occur: Safety/medical concerns         Wheelchair 50 feet with 2 turns activity    Assist    Wheelchair 50 feet with 2 turns activity did not occur: Safety/medical concerns       Wheelchair 150 feet activity     Assist  Wheelchair 150 feet activity did not occur: Safety/medical concerns       Blood pressure (!) 145/53, pulse 81, temperature 98.1 F (36.7 C), temperature  source Oral, resp. rate 18, height 5\' 5"  (1.651 m), weight 55.8 kg, SpO2 100 %.    Medical Problem List and Plan: 1. 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             -patient may  shower             -ELOS/Goals: ~ 3 weeks- min A  -Continue CIR 2.  Anemia: resume Heparin [place] TID. Transfuse 1U on 5/15.              -antiplatelet therapy: aspirin 81 mg daily 3. Pain Management: Tylenol prn 4. Mood: LCSW to evaluate and provide emotional support             -antipsychotic agents: n/a 5. Neuropsych: This patient is not capable of making decisions on her own behalf. 6. Skin/Wound Care: Routine skin care checks 7. Dysphagia             -- continue Dysphagia 1 diet with thin liquids.  Crushed meds with applesauce.  SLP evaluation.             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    01/09/2022    7:28 PM 01/09/2022    2:03 PM 01/09/2022    3:37 AM  Vitals with BMI  Systolic 242 683 88  Diastolic 53  74 69  Pulse 81 89 92    9: Diabetes mellitus: CBGs qAC,qHS; monitor intake and activity --continue SSI --started home glipizide at 2.5mg  daily D/c Ensure between meals 10: Hypertension: increase lisinopril to 40 mg, amlodipine 10 mg, Lopressor. Repeat creatinine on Monday given increase in Lisinopril.  5/13 BP controlled today. 5/14 some variability in B/P, with mild elevation, some episodes of bradycardia (HR 46 at 051 today). -D/c lisinopril today since BUN 28->68 and Crt 2.28->3.20, with increase in past 2 days.  Hold lopressor with bradycardia episodes.  Will see how B/P trends on amlodipine 10 mg, may consider adding hydralazine if B/p is elevated.   -now hypotensive, change fluids to NS HS 12 hours      01/09/2022     7:28 PM 01/09/2022    2:03 PM 01/09/2022    3:37 AM  Vitals with BMI  Systolic 921 194 88  Diastolic 53 74 69  Pulse 81 89 92    11: Hyperlipidemia: continue rosuvastatin 10 mg 12: Moderate malnutrition: continue supplements 13: Chronic kidney disease stage 3b: follow-up BMP     -5/13 B/P is in the 120's today. Pt had lisinopril increased to 40 mg yesterday for elevated B/P.  Since lisinopril may increase Crt, decrease lisinopril to 20 mg daily today and push po intake and repeat BMP. -5/14 Crt up to 3.2 and BUN up to 68.  Fluid bolus (1L NS) and continuous fluid (0.45 NS at 75 ml/hr) given for rise in BUN and Crt. -d/c lisinopril (per Dr. Christella Noa) since could be a possible cause of elevated Crt . -Repeat creatinine today     Latest Ref Rng & Units 01/09/2022    1:25 PM 01/07/2022    7:48 AM 01/06/2022    5:53 AM  BMP  Glucose 70 - 99 mg/dL 183   185   191    BUN 8 - 23 mg/dL 47   60   68    Creatinine 0.44 - 1.00 mg/dL 2.69   2.78   3.20    Sodium 135 - 145 mmol/L 142   140   144    Potassium 3.5 - 5.1 mmol/L 4.5   4.7   4.7    Chloride 98 - 111 mmol/L 113   112   110    CO2 22 - 32 mmol/L 19   23   25     Calcium 8.9 - 10.3 mg/dL 8.6   7.5   8.1      14: Glaucoma: continue latanoprost drops 15: Hypothyroidism: continue Synthroid 16: Diarrhea/loose stools: monitor; getting loperamide 2 mg daily (BID prn) -5/13 Continue current regimen. 17: Bowel and bladder incontinence: start bowel and bladder program 18. Daytime somnolence: [employer] and SW if therapy can be scheduled a little later in the day and if daughters can be present to help motivate her 40. Bradycardia: decrease lopressor to 25mg  BID 5/13 HR 79-95, WNL. 5/14 HR mostly above 60, but still has some episodes of bradycardia.  Currently just on amlodipine 10 mg, consider HR and B/P if deciding to add back lopressor. 20. History of GI bleed: start protonix.  21. Hypotension: resolved, hemoglobin reviewed and stable 22.  Neurogenic bladder: bladder program ordered starting 5/11. 23. Stool incontinence: fiber added to bulk stool   LOS: 7 days A FACE TO FACE EVALUATION WAS PERFORMED  Martha Clan P Hervey Wedig 01/10/2022, 12:29 PM

## 2022-01-10 NOTE — Progress Notes (Signed)
Patient ID: Patricia Dudley, female   DOB: Feb 10, 1930, 88 y.o.   MRN: [id number] Team Conference Report to Patient/Family  Team Conference discussion was reviewed with the patient and caregiver, including goals, any changes in plan of care and target discharge date.  Patient and caregiver express understanding and are in agreement.  The patient has a target discharge date of 01/30/22.  SW met with patient and two daughter. Sw provided team conference updates. Patient daughter will begin education on 5/23 1-4 PM. If daughter feel as if they are unable to handle care they will consider [facility] resources. Daughter would like patient to use [facility] for [facility] services.    Dyanne Iha 01/10/2022, 12:26 PM

## 2022-01-10 NOTE — Progress Notes (Signed)
Speech Language Pathology Daily Session Note  Patient Details  Name: Patricia Dudley MRN: [id number] Date of Birth: [birth year]  Today's Date: 01/10/2022 SLP Individual Time: 0730-0830 SLP Individual Time Calculation (min): 60 min  Short Term Goals: Week 1: SLP Short Term Goal 1 (Week 1): Pt will follow 3 commands within a functional context given Max multimodal A. - Pt followed 3 commands within a functional context (transfer and eating/feeding) given Mod to Max multimodal A.  SLP Short Term Goal 2 (Week 1): Pt will participate in MBSS to determine safety diet consistencies with 100% completion. SLP Short Term Goal 2 - Progress (Week 1): Met  SLP Short Term Goal 3 (Week 1): Pt will communicate wants, needs, ideas, and thoughts x 1 given Total A for use of mulitmodal communication methods. - x 4 via yes/no head nods given Max multimodal A, wait time, and repetition of question prompt.  SLP Short Term Goal 4 (Week 1): Pt will maintain focused attention for 2 minutes with Mod A. - Pt maintained focused attention for 2+ minutes with Mod A for completion of AM meal.  SLP Short Term Goal 5 (Week 1): Pt will participate in ongoing cognitive-linguistic evlauation to further determine ST POC and aid in dc planning. - Repeated single words on 4 occasions, upon request.  Skilled Therapeutic Interventions: Pt seen this date for skilled ST intervention targeting dysphagia and communication goals outlined in care plan. Pt received lethargic, lying reclined in bed. Denies pain when asked yes/no questions. Brief changed and pants donned with Total A + 2 from NT for time management. Transferred from bed to TIS with use of Stedy + 2 to improve alertness and positioning for PO intake. Agreeable to ST intervention.  SLP facilitated today's session by continuing to provide the following skilled interventions within the context of functional tx tasks: extra processing time, supported communication interventions  (simple language, written + visual choice prompts, yes/no + close-ended vs open-ended questions, and use of gestures), Max A for adherence to aspiration precautions, and Max multimodal A for functional communication. No clinical s/sx concerning for aspiration with recommended textures (Dysphagia 1 and Nectar-Thick liquids provided separately from one another); pt did self-feed ~20% of puree bites given Max A. Continues to attend best when items are placed and people stand in her L field.    Pt continues to inconsistently answer yes/no questions via head gestures re: personal cares/needs when provided with Max multimodal A to include gaining her attention first, SLP repositioning to pt's left visual field, repeating requests, modeling commands, providing visual supports/aids, and allowing for extra processing time. Communicated verbally x 3 this date following SLP verbal response; x 4 repetition of single words on request. Please see above for objective data re: pt's performance.  PT left in TIS with safety measures in place. Direct hand off to NT for completion of AM meal. Continue per current ST POC.  Pain No denied pain when asked yes/no questions; NAD appreciated.   Therapy/Group: Individual Therapy  Emiya Loomer A Alonza Knisley 01/10/2022, 10:46 AM

## 2022-01-10 NOTE — Progress Notes (Signed)
Occupational Therapy Session Note  Patient Details  Name: Patricia Dudley MRN: [id number] Date of Birth: 1930/04/04  Today's Date: 01/10/2022 OT Individual Time: 9826-4158 OT Individual Time Calculation (min): 71 min    Short Term Goals: Week 1:  OT Short Term Goal 1 (Week 1): Pt will visually attend to R side of body with min A during functional task OT Short Term Goal 2 (Week 1): Pt will follow 1 step command in prep for ADL task, with use of multimodal cues and no more than min A OT Short Term Goal 3 (Week 1): Pt will roll either R or L during bed level toileting, with mod A and use of multimodal cues as needed  Skilled Therapeutic Interventions/Progress Updates:    Patient received sleeping in wheelchair.  Patient slow to wake, but then remained awake entire session.  Patient smiles when greeted with a smile.  Patient transferred from wheelchair to mat table with min assist - stand step method.  Worked on automatic tasks, and tasks that might challenge postural control, and improve sustained attention.  Tossing and catching a ball, kicking a ball while seated.  Patient following demonstration cues better than verbal or even gestural cues.  Also best response with familiar functional activities, putting pillow case on pillow, folding towels, cleaning teeth, washing/applying lotion to hands.  Patient lacks initiation, and has significant delay to response.   Son came into room during session, and patient visibly brightened and spoke to him.  Patient assisted into bed as this was last therapy of the day.  Patient stood from wheelchair and took shuffling steps toward bed, assisted to scoot to head of bed, and assisted with bridging to get into middle of bed.  Bed alarm engaged - all personal needs in place.  RN notified of lump on LLE.    Therapy Documentation Precautions:  Precautions Precautions: Fall Precaution Comments: aspiration precautions Restrictions Weight Bearing Restrictions:  No Pain: No pain - noted a large round lump on patient's left calf just below knee, warm to touch.  RN notified.      Therapy/Group: Individual Therapy  [name] 01/10/2022, 3:19 PM

## 2022-01-10 NOTE — Plan of Care (Signed)
  Problem: Consults Goal: RH STROKE PATIENT EDUCATION Description: See Patient Education module for education specifics  Outcome: Progressing   Problem: RH BOWEL ELIMINATION Goal: RH STG MANAGE BOWEL WITH ASSISTANCE Description: STG Manage Bowel with min Assistance. Outcome: Progressing   Problem: RH BLADDER ELIMINATION Goal: RH STG MANAGE BLADDER WITH ASSISTANCE Description: STG Manage Bladder With min Assistance Outcome: Progressing   Problem: RH SAFETY Goal: RH STG ADHERE TO SAFETY PRECAUTIONS W/ASSISTANCE/DEVICE Description: STG Adhere to Safety Precautions With cues Assistance/Device. Outcome: Progressing   Problem: RH PAIN MANAGEMENT Goal: RH STG PAIN MANAGED AT OR BELOW PT'S PAIN GOAL Description: At or below level 4 with prns Outcome: Progressing   Problem: RH KNOWLEDGE DEFICIT Goal: RH STG INCREASE KNOWLEDGE OF DIABETES Description: Patient and daughter will be able to manage DM with medications and dietary modifications using handouts and educational materials independently Outcome: Progressing Goal: RH STG INCREASE KNOWLEDGE OF DYSPHAGIA/FLUID INTAKE Description: Patient and dtr will be able to manage Dysphagia, medications and dietary modifications using handouts and educational materials independently Outcome: Progressing Goal: RH STG INCREASE KNOWLEGDE OF HYPERLIPIDEMIA Description: Patient and dtr will be able to manage HLD with medications and dietary modifications using handouts and educational materials independently Outcome: Progressing Goal: RH STG INCREASE KNOWLEDGE OF STROKE PROPHYLAXIS Description: Patient and dtr will be able to manage secondary stroke risks with medications and dietary modifications using handouts and educational materials independently Outcome: Progressing   Problem: RH KNOWLEDGE DEFICIT Goal: RH STG INCREASE KNOWLEDGE OF HYPERTENSION Description: Patient and dtr will be able to manage HTN with medications and dietary modifications  using handouts and educational materials independently Outcome: Progressing   Problem: RH Vision Goal: RH LTG Vision (Specify) Outcome: Progressing

## 2022-01-10 NOTE — Progress Notes (Signed)
Physical Therapy Session Note  Patient Details  Name: Patricia Dudley MRN: [id number] Date of Birth: 1929/12/25  Today's Date: 01/10/2022 PT Individual Time: 1000-1056 PT Individual Time Calculation (min): 56 min   Short Term Goals: Week 1:  PT Short Term Goal 1 (Week 1): Pt will roll side to side w/ max A PT Short Term Goal 2 (Week 1): Pt will transfer sup to sit w/ max A PT Short Term Goal 3 (Week 1): Pt will transfer sit to stand w/ max A PT Short Term Goal 4 (Week 1): Pt will amb w/ LRAD x 10'.  Skilled Therapeutic Interventions/Progress Updates:   Received pt transferring into bathroom in [place] with NT; PT took over with care. Pt did not appear to be in any pain during session. Session with emphasis on functional mobility/transfers, toileting, generalized strengthening and endurance, NMR/sequencing, simulated car transfers, and gait training. Pt transported to toilet with bedside commode over top dependently. Stood from [place] with min/mod A of 1 to initiate and required max A to remove clothes. Pt continent of bladder and performed hygiene management standing with min A of 1 for balance - pt perseverating on wiping over and over. Stood from commode with mod A and able to initiate pulling pants up on L side but required max A on R side. Transferred to sink and washed hands sitting on [place] flaps with min A - max cues for sequencing and visual scanning. Pt transported to/from room in TIS WC dependently. Pt performed simulated car transfer with RW and mod A of 1 (+2 for safety) to enter and mod A of 2 (for safety and physical assist) when exiting car. Pt able to get LLE in car independently but required assist for RLE management. Again, pt required max cues for sequencing/motor planning with activity, especially when turning - of note, pt also has difficulty stepping RLE back when preparing to sit. Sit<>stand with RW and light mod A and pt ambulated 65ft with RW and mod A with +2 for close  WC follow. Pt with significant R inattention running completley into door requiring max cues to sidestep L to get through. With increased fatigue pt required manual facilitation to advance RLE and steer RW as pt shuffles feet across floor and demonstrates decreased cadence, flexed trunk, and poor foot clearance R>L. Then worked on blocked practice sit<>stands x 2 reps with RW and mod A overall - cues for anterior weight shifting, to get feet underneath BOS, for hand placement on RW, and to reach back prior to sitting down (pt continues to require max cues but does demonstrate improvements day to day). Concluded session with pt sitting in TIS WC, needs within reach, and seatbelt alarm on.   Therapy Documentation Precautions:  Precautions Precautions: Fall Precaution Comments: aspiration precautions Restrictions Weight Bearing Restrictions: No  Therapy/Group: Individual Therapy Alfonse Alpers PT, DPT  01/10/2022, 7:13 AM

## 2022-01-11 LAB — GLUCOSE, CAPILLARY
Glucose-Capillary: 203 mg/dL — ABNORMAL HIGH (ref 70–99)
Glucose-Capillary: 239 mg/dL — ABNORMAL HIGH (ref 70–99)
Glucose-Capillary: 243 mg/dL — ABNORMAL HIGH (ref 70–99)
Glucose-Capillary: 293 mg/dL — ABNORMAL HIGH (ref 70–99)

## 2022-01-11 MED ORDER — GLIPIZIDE 5 MG PO TABS
10.0000 mg | ORAL_TABLET | Freq: Every day | ORAL | Status: DC
Start: 2022-01-12 — End: 2022-01-18
  Administered 2022-01-12 – 2022-01-17 (×6): 10 mg via ORAL
  Filled 2022-01-11 (×7): qty 2

## 2022-01-11 NOTE — Progress Notes (Signed)
Speech Language Pathology Weekly Progress and Session Note  Patient Details  Name: Patricia Dudley MRN: [id number] Date of Birth: Feb 15, 1930  Beginning of progress report period: Jan 04, 2022 End of progress report period: Jan 11, 2022  Today's Date: 01/11/2022 SLP Individual Time: 1340-1425 SLP Individual Time Calculation (min): 45 min  Short Term Goals: Week 1: SLP Short Term Goal 1 (Week 1): Pt will follow 3 commands within a functional context given Max multimodal A. SLP Short Term Goal 1 - Progress (Week 1): Met SLP Short Term Goal 2 (Week 1): Pt will participate in MBSS to determine safety diet consistencies with 100% completion. SLP Short Term Goal 2 - Progress (Week 1): Met SLP Short Term Goal 3 (Week 1): Pt will communicate wants, needs, ideas, and thoughts x 1 given Total A for use of mulitmodal communication methods. SLP Short Term Goal 3 - Progress (Week 1): Met SLP Short Term Goal 4 (Week 1): Pt will maintain focused attention for 2 minutes with Mod A. SLP Short Term Goal 4 - Progress (Week 1): Met SLP Short Term Goal 5 (Week 1): Pt will participate in ongoing cognitive-linguistic evlauation to further determine ST POC and aid in dc planning. SLP Short Term Goal 5 - Progress (Week 1): Met    New Short Term Goals: Week 2: SLP Short Term Goal 1 (Week 2): Pt will follow 3 commands within a functional context given Mod multimodal A. SLP Short Term Goal 2 (Week 2): Pt will communicate wants, needs, ideas, and thoughts x 5 given Max  A for use of mulitmodal communication methods. SLP Short Term Goal 3 (Week 2): Pt will consume current diet with minimal overt s/s of aspiration with Mod verbal cues for use of swallowing compensatory strategies. SLP Short Term Goal 4 (Week 2): Pt will maintain sustianed attention for 5 minutes with Mod A.  Weekly Progress Updates: Patient has made slow but functional gains and has met 5 of 5 STGs this reporting period. Currently, patient appears  more consistently alert and engaged and can focus attention to functional tasks for up to 2 minute intervals. Patient is consuming Dys. 1 textures with nectar-thick liquids with minimal overt s/s of aspiration with Max verbal cues for use of swallowing compensatory strategies. Patient demonstrates improved initiation of functional tasks and can follow functional commands with Max A multimodal cues. Patient's overall initiation for utilization of multimodal communication has also improved as she utilizes gestures and intermittent verbalizations to express basic wants/needs with extra time and overall Max A multimodal cues. Patient and family education is ongoing. Patient would benefit from continued skilled SLP intervention to maximize her functional communication, cognitive functioning and swallowing function in order to reduce caregiver burden upon discharge.     Intensity: Minumum of 1-2 x/day, 30 to 90 minutes Frequency: 3 to 5 out of 7 days Duration/Length of Stay: 01/30/22 Treatment/Interventions: Speech/Language facilitation;Patient/family education;Multimodal communication approach;Therapeutic Activities;Dysphagia/aspiration precaution training;Functional tasks;Cognitive remediation/compensation;Internal/external aids;Cueing hierarchy;Environmental controls   Daily Session  Skilled Therapeutic Interventions:   Skilled treatment session focused on cognitive goals. Upon arrival, patient was lethargic while upright in the wheelchair. SLP facilitated session by providing extra time and Max verbal cues for patient to open her oral cavity for oral care via the suction toothbrush. Minimal oral residuals noted. SLP attempted to engage patient in a task that focused on identifying functional items from a field of 2. Hand over hand assist needed for initiation, suspect function impacted by fatigue. SLP assisted the patient to the  commode for timed toileting with hand over hand assist to initiate transfer.  Patient unable to void and initiated standing from commode independently. SLP attempted to transfer the patient back to bed due to fatigue but patient indicated that she wanted to stay up in the chair. Throughout session, patient answered yes/no questions using head nods in 25% of opportunities. Patient left reclined in tilt-in-space wheelchair with alarm on and all needs within reach. NT made aware. Continue with current plan of care.    Pain No indications of pain   Therapy/Group: Individual Therapy  Erine Phenix 01/11/2022, 7:03 AM

## 2022-01-11 NOTE — Plan of Care (Signed)
  Problem: Consults Goal: RH STROKE PATIENT EDUCATION Description: See Patient Education module for education specifics  Outcome: Progressing   Problem: RH BOWEL ELIMINATION Goal: RH STG MANAGE BOWEL WITH ASSISTANCE Description: STG Manage Bowel with min Assistance. Outcome: Progressing   Problem: RH BLADDER ELIMINATION Goal: RH STG MANAGE BLADDER WITH ASSISTANCE Description: STG Manage Bladder With min Assistance Outcome: Progressing   Problem: RH SAFETY Goal: RH STG ADHERE TO SAFETY PRECAUTIONS W/ASSISTANCE/DEVICE Description: STG Adhere to Safety Precautions With cues Assistance/Device. Outcome: Progressing   Problem: RH PAIN MANAGEMENT Goal: RH STG PAIN MANAGED AT OR BELOW PT'S PAIN GOAL Description: At or below level 4 with prns Outcome: Progressing   Problem: RH KNOWLEDGE DEFICIT Goal: RH STG INCREASE KNOWLEDGE OF DIABETES Description: Patient and daughter will be able to manage DM with medications and dietary modifications using handouts and educational materials independently Outcome: Progressing Goal: RH STG INCREASE KNOWLEDGE OF DYSPHAGIA/FLUID INTAKE Description: Patient and dtr will be able to manage Dysphagia, medications and dietary modifications using handouts and educational materials independently Outcome: Progressing Goal: RH STG INCREASE KNOWLEGDE OF HYPERLIPIDEMIA Description: Patient and dtr will be able to manage HLD with medications and dietary modifications using handouts and educational materials independently Outcome: Progressing Goal: RH STG INCREASE KNOWLEDGE OF STROKE PROPHYLAXIS Description: Patient and dtr will be able to manage secondary stroke risks with medications and dietary modifications using handouts and educational materials independently Outcome: Progressing   Problem: RH KNOWLEDGE DEFICIT Goal: RH STG INCREASE KNOWLEDGE OF HYPERTENSION Description: Patient and dtr will be able to manage HTN with medications and dietary modifications  using handouts and educational materials independently Outcome: Progressing   Problem: RH Vision Goal: RH LTG Vision (Specify) Outcome: Progressing

## 2022-01-11 NOTE — Progress Notes (Signed)
PROGRESS NOTE   Subjective/Complaints: No new compaints this morning Working with Vicente Males Discussed improvements in Hgb, starting protonix  ROS: Limited by language, unable to answer commands.  Objective:   No results found. Recent Labs    01/09/22 1325  WBC 12.4*  HGB 9.7*  HCT 30.7*  PLT 400   Recent Labs    01/09/22 1325  NA 142  K 4.5  CL 113*  CO2 19*  GLUCOSE 183*  BUN 47*  CREATININE 2.69*  CALCIUM 8.6*    Intake/Output Summary (Last 24 hours) at 01/11/2022 1232 Last data filed at 01/11/2022 0836 Gross per 24 hour  Intake 560 ml  Output --  Net 560 ml        Physical Exam: Vital Signs Blood pressure 121/83, pulse 100, temperature 98.3 F (36.8 C), temperature source Oral, resp. rate 16, height 5\' 5"  (1.651 m), weight 55.8 kg, SpO2 100 %.  Gen: no distress, normal appearing, BMI 20.47 HENT: oral mucosa pink and moist, NCAT Eyes:     General:        Right eye: No discharge.        Left eye: No discharge.     Comments: Left lid closed R eye lid more open  Mouth: denitrues in place Cardiovascular:     Rate and Rhythm: irregular, a fib, had episodes of bradycardia    Heart sounds: Normal heart sounds. No murmur heard.   No gallop.  Pulmonary:     Effort: Pulmonary effort is normal. No respiratory distress.     Breath sounds: Normal breath sounds. No wheezing or rales.  Abdominal:     General: Bowel sounds are normal. There is no distension.     Palpations: Abdomen is soft.     Tenderness: There is no abdominal tenderness.  Musculoskeletal:     Cervical back: Neck supple. Protracted posture    Comments: Limited participation in movement today, squeeze hand: L grip/biceps 5-/5 BUE: 3/5 grip/biceps.  Would not participate in RLE and LLE movement. Skin:    General: Skin is warm and dry.  Neurological:     Mental Status: She is alert.     Comments: Squeezes hands on command, did protrude  tongue on command today, made attempt to smile when asked. No verbal expression shaking head yes or no-  Was able to remain awake and alert during exam today. +echolalia Psychiatric:     Comments: Smiling,  +echolalia GU: continent of bladder at times  Assessment/Plan: 1. Functional deficits which require 3+ hours per day of interdisciplinary therapy in a comprehensive inpatient rehab setting. Physiatrist is providing close team supervision and 24 hour management of active medical problems listed below. Physiatrist and rehab team continue to assess barriers to discharge/monitor patient progress toward functional and medical goals  Care Tool:  Bathing    Body parts bathed by patient: Left lower leg, Right lower leg, Left upper leg, Right upper leg, Front perineal area   Body parts bathed by helper: Right arm, Left arm, Chest, Abdomen, Buttocks, Face Body parts n/a: Front perineal area, Buttocks   Bathing assist Assist Level: Total Assistance - Patient < 25%     Upper Body  Dressing/Undressing Upper body dressing   What is the patient wearing?: Pull over shirt    Upper body assist Assist Level: Total Assistance - Patient < 25%    Lower Body Dressing/Undressing Lower body dressing      What is the patient wearing?: Incontinence brief, Pants     Lower body assist Assist for lower body dressing: Total Assistance - Patient < 25%     Toileting Toileting    Toileting assist Assist for toileting: Total Assistance - Patient < 25%     Transfers Chair/bed transfer  Transfers assist     Chair/bed transfer assist level: Moderate Assistance - Patient 50 - 74%     Locomotion Ambulation   Ambulation assist   Ambulation activity did not occur: Safety/medical concerns  Assist level: Moderate Assistance - Patient 50 - 74% Assistive device: Walker-rolling Max distance: 37ft   Walk 10 feet activity   Assist  Walk 10 feet activity did not occur: Safety/medical  concerns  Assist level: Moderate Assistance - Patient - 50 - 74% Assistive device: Walker-rolling   Walk 50 feet activity   Assist Walk 50 feet with 2 turns activity did not occur: Safety/medical concerns         Walk 150 feet activity   Assist Walk 150 feet activity did not occur: Safety/medical concerns         Walk 10 feet on uneven surface  activity   Assist Walk 10 feet on uneven surfaces activity did not occur: Safety/medical concerns         Wheelchair     Assist Is the patient using a wheelchair?: No   Wheelchair activity did not occur: Safety/medical concerns         Wheelchair 50 feet with 2 turns activity    Assist    Wheelchair 50 feet with 2 turns activity did not occur: Safety/medical concerns       Wheelchair 150 feet activity     Assist  Wheelchair 150 feet activity did not occur: Safety/medical concerns       Blood pressure 121/83, pulse 100, temperature 98.3 F (36.8 C), temperature source Oral, resp. rate 16, height 5\' 5"  (1.651 m), weight 55.8 kg, SpO2 100 %.    Medical Problem List and Plan: 1. 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             -patient may  shower             -ELOS/Goals: ~ 3 weeks- min A  -Continue CIR 2.  Anemia: resume Heparin [place] TID. Transfused 1U on 5/15. Post-transfusion hemoglobin trending upward nicely, repeat Monday              -antiplatelet therapy: aspirin 81 mg daily 3. Pain Management: Tylenol prn 4. Mood: LCSW to evaluate and provide emotional support             -antipsychotic agents: n/a 5. Neuropsych: This patient is not capable of making decisions on her own behalf. 6. Skin/Wound Care: Routine skin care checks 7. Dysphagia             -- continue Dysphagia 1 diet with thin liquids.  Crushed meds with applesauce.  SLP evaluation.             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    01/11/2022    3:15 AM 01/10/2022    7:39 PM 01/10/2022    2:16 PM  Vitals with BMI  Systolic 160 89 109  Diastolic 83 75 72  Pulse 323 98 105    9: Diabetes mellitus: CBGs qAC,qHS; monitor intake and activity --continue SSI --increase glipizide back to 10mg  (home dose) D/c Ensure between meals 10: Hypertension: increase lisinopril to 40 mg, amlodipine 10 mg, Lopressor. Repeat creatinine on Monday given increase in Lisinopril.  5/13 BP controlled today. 5/14 some variability in B/P, with mild elevation, some episodes of bradycardia (HR 46 at 051 today). -D/c lisinopril today since BUN 28->68 and Crt 2.28->3.20, with increase in past 2 days.  Hold lopressor with bradycardia episodes.  Will see how B/P trends on amlodipine 10 mg, may consider adding hydralazine if B/p is elevated.   -now hypotensive, change fluids to NS HS 12 hours      01/11/2022    3:15 AM 01/10/2022    7:39 PM 01/10/2022    2:16 PM  Vitals with BMI  Systolic 557 89 322  Diastolic 83 75 72  Pulse 025 98 105    11: Hyperlipidemia: continue rosuvastatin 10 mg 12: Moderate malnutrition: continue supplements 13: Chronic kidney disease stage 3b: follow-up BMP     -5/13 B/P is in the 120's today. Pt had lisinopril increased to 40 mg yesterday for elevated B/P.  Since lisinopril may increase Crt, decrease lisinopril to 20 mg daily today and push po intake and repeat BMP. -5/14 Crt up to 3.2 and BUN up to 68.  Fluid bolus (1L NS) and continuous fluid (0.45 NS at 75 ml/hr) given for rise in BUN and Crt. -d/c lisinopril (per Dr. Christella Noa) since could be a possible cause of elevated Crt . -Repeat creatinine today     Latest Ref Rng & Units 01/09/2022    1:25 PM 01/07/2022    7:48 AM 01/06/2022    5:53 AM  BMP  Glucose 70 - 99 mg/dL 183   185   191     BUN 8 - 23 mg/dL 47   60   68    Creatinine 0.44 - 1.00 mg/dL 2.69   2.78   3.20    Sodium 135 - 145 mmol/L 142   140   144    Potassium 3.5 - 5.1 mmol/L 4.5   4.7   4.7    Chloride 98 - 111 mmol/L 113   112   110    CO2 22 - 32 mmol/L 19   23   25     Calcium 8.9 - 10.3 mg/dL 8.6   7.5   8.1      14: Glaucoma: continue latanoprost drops 15: Hypothyroidism: continue Synthroid 16: Diarrhea/loose stools: monitor; getting loperamide 2 mg daily (BID prn) -5/13 Continue current regimen. 17: Bowel and bladder incontinence: start bowel and bladder program 18. Daytime somnolence: [employer] and SW if therapy can be scheduled a little later in the day and if daughters can be present to help motivate her 61. Bradycardia: decrease lopressor to 25mg  BID 5/13 HR 79-95, WNL. 5/14 HR mostly above 60, but still has some episodes of bradycardia.  Currently just on amlodipine 10 mg, consider HR and B/P if deciding to add back lopressor. 20. History of GI bleed: start protonix.  21. Hypotension: resolved, hemoglobin reviewed and stable 22. Neurogenic bladder: bladder program ordered starting 5/11. 23. Stool incontinence: fiber added to bulk  stool 24. Screening for vitamin D deficiency: check vitamin D level on Monday   LOS: 8 days A FACE TO FACE EVALUATION WAS PERFORMED  Clide Deutscher Ladashia Demarinis 01/11/2022, 12:32 PM

## 2022-01-11 NOTE — Progress Notes (Signed)
Occupational Therapy Weekly Progress Note  Patient Details  Name: Patricia Dudley MRN: [id number] Date of Birth: 1930/05/12  Beginning of progress report period: Jan 04, 2022 End of progress report period: Jan 11, 2022   Patient has met 3 of 3 short term goals.  Pt is making great progress towards LTGs. Pt has progressed functional transfers from Mod A +2 for stand pivot with HHA to Mod A of 1 and 2nd person for safety/management of RW for ambulatory transfers, is able to bathe at an overall Total A level, dress at an overall Max A level and requires Max assist for toileting tasks. Pt continues to be demonstrate significant delayed processing, initiation, and motor planning deficits especially outside of functional/familiar ADL tasks. Pt is now able to follow one step commands about 50% of the time, with use of HOH assist and gesture demonstrations more effective than verbal cues. She is attending to the R side of her body with less cueing and has uttered short word sentences on occasion. Though pt is physically more mobile, pt still requires max to total A for ADL tasks to promote initiating steps and following through with task.  Patient continues to demonstrate the following deficits: muscle weakness, decreased cardiorespiratoy endurance, impaired timing and sequencing, decreased coordination, and decreased motor planning, decreased visual motor skills, decreased attention to right and decreased motor planning, decreased initiation, decreased attention, decreased awareness, decreased problem solving, decreased safety awareness, decreased memory, and delayed processing, and decreased sitting balance, decreased standing balance, hemiplegia, and decreased balance strategies and therefore will continue to benefit from skilled OT intervention to enhance overall performance with BADL and Reduce care partner burden.  Patient progressing toward long term goals..  Continue plan of care.  OT Short Term  Goals Week 1:  OT Short Term Goal 1 (Week 1): Pt will visually attend to R side of body with min A during functional task OT Short Term Goal 1 - Progress (Week 1): Met OT Short Term Goal 2 (Week 1): Pt will follow 1 step command in prep for ADL task, with use of multimodal cues and no more than min A OT Short Term Goal 2 - Progress (Week 1): Met OT Short Term Goal 3 (Week 1): Pt will roll either R or L during bed level toileting, with mod A and use of multimodal cues as needed OT Short Term Goal 3 - Progress (Week 1): Met Week 2:  OT Short Term Goal 1 (Week 2): Pt will thread LB clothing over both feet with no more than CGA for balance and multimodal cueing as needed OT Short Term Goal 2 (Week 2): Pt will tolerate 2+ mins in stance during ADL task with no more than min A for balance OT Short Term Goal 3 (Week 2): Pt will attend to ADL task with min A for at least 2 mins to promote functional participation and sustained attention   [place] 01/11/2022, 7:55 AM

## 2022-01-11 NOTE — Progress Notes (Signed)
Physical Therapy Weekly Progress Note  Patient Details  Name: Patricia Dudley MRN: [id number] Date of Birth: 1929-09-17  Beginning of progress report period: Jan 04, 2022 End of progress report period: Jan 11, 2022  Today's Date: 01/11/2022 PT Individual Time: 1443-1540 PT Individual Time Calculation (min): 68 min   Patient has met 4 of 4 short term goals. Pt demonstrates gradual progress towards long term goals. Pt able to complete bed mobility with max A of 1, transfers with RW and mod A of 1, ambulate up to 72f with RW and mod/max A of 1/occasional +2 assist, navigate 4 steps with 2 rails and max A of 2, and perform simulated car transfer with mod A of 2. Pt continues to be limited by aphasia, R inattention, impaired motor planning/sequencing, poor initiation, and decreased awareness. However, pt has demonstrated improvements since eval. Goals upgraded to CGA overall due to improvements in strength, balance, and motor planning/initiation. Pt will benefit from family education training prior to discharge.   Patient continues to demonstrate the following deficits muscle weakness, decreased cardiorespiratoy endurance, impaired timing and sequencing, unbalanced muscle activation, motor apraxia, decreased coordination, and decreased motor planning, decreased attention to right and decreased motor planning, decreased initiation, decreased attention, decreased awareness, decreased problem solving, decreased safety awareness, decreased memory, and delayed processing, and decreased standing balance, decreased postural control, hemiplegia, and decreased balance strategies and therefore will continue to benefit from skilled PT intervention to increase functional independence with mobility.  Patient progressing toward long term goals..  Continue plan of care.  PT Short Term Goals Week 1:  PT Short Term Goal 1 (Week 1): Pt will roll side to side w/ max A PT Short Term Goal 2 (Week 1): Pt will transfer  sup to sit w/ max A PT Short Term Goal 3 (Week 1): Pt will transfer sit to stand w/ max A PT Short Term Goal 4 (Week 1): Pt will amb w/ LRAD x 10'.  Skilled Therapeutic Interventions/Progress Updates:  Ambulation/gait training;Discharge planning;Functional mobility training;Therapeutic Activities;UE/LE Strength taining/ROM;Balance/vestibular training;Community reintegration;Neuromuscular re-education;Patient/family education;Therapeutic Exercise   Today's Interventions: Received pt semi-reclined in bed and did not appear to be in any pain during session but slow to initiate and initially not making eye contact with therapist; pulling blankets up over her face. Session with emphasis on functional mobility/transfers, attention/initiation and NMR/motor planning, dynamic standing balance/coordination, and gait training. Checked to ensure brief was clean and pt found to be dry - pt also denied urge to use restroom. Donned pants in supine with max A (total A for RLE management) and transferred semi-reclined<>sitting EOB with max A for LE management and trunk control due to little/no initiation. Pt with posterior lean sitting EOB with 1 posterior LOB requiring mod/max A to correct. Sit<>stand with RW and mod A and required total A to pull pants over hips. Pt ambulated 551fwith RW and mod A to WC - max cues for sequencing/turning and manual facilitation to steer RW. MD arrived for morning rounds and pt transported to/from room in TIS WC dependently. Sit<>stand with RW and mod A and pt ambulated 1783fith RW and mod A - pt required assist to steer/advance RW and manual facilitation to advance RLE with increased fatigue due to poor foot clearance. Pt with increased difficulty with motor planning/sequencing when turning to sit on mat. Worked on dynamic standing balance, following commands, attention, and initiation picking up beanbags and placing them into bucket. Pt did not initiate lifting hand from RW and required  hand over hand guidance to pick up beanbags with LUE and move them to bucket - pt was able to successfully let go of 5/6 beanbags and initiated grabbing onto 2/6 beanbags. Pt frequently attempting to sit and required max verbal/tactile cues for upright posture. Pt ambulated additional 8f with RW and mod/max A back to WC (same cues and assist mentioned above). Concluded session with pt sitting in TIS WC, needs within reach, and seatbelt alarm on awaiting upcoming OT session.   Therapy Documentation Precautions:  Precautions Precautions: Fall Precaution Comments: aspiration precautions Restrictions Weight Bearing Restrictions: No  Therapy/Group: Individual Therapy AAlfonse AlpersPT, DPT  01/11/2022, 7:06 AM

## 2022-01-11 NOTE — Discharge Instructions (Addendum)
Inpatient Rehab Discharge Instructions  Patricia Dudley Discharge date and time: 01/31/2022  Activities/Precautions/ Functional Status: Activity: no lifting, driving, or strenuous exercise until cleared by MD Diet: diabetic diet; pureed diet with crushed meds in applesauce Wound Care: none needed Functional status:  ___ No restrictions     ___ Walk up steps independently __x_ 24/7 supervision/assistance   ___ Walk up steps with assistance ___ Intermittent supervision/assistance  ___ Bathe/dress independently ___ Walk with walker     _x__ Bathe/dress with assistance ___ Walk Independently    ___ Shower independently ___ Walk with assistance    _x__ Shower with assistance __x_ No alcohol     ___ Return to work/school ________  Special Instructions:  No driving, alcohol consumption or tobacco use.   STROKE/TIA DISCHARGE INSTRUCTIONS SMOKING Cigarette smoking nearly doubles your risk of having a stroke & is the single most alterable risk factor  If you smoke or have smoked in the last 12 months, you are advised to quit smoking for your health. Most of the excess cardiovascular risk related to smoking disappears within a year of stopping. Ask you doctor about anti-smoking medications [place] Quit Line: 1-800-QUIT NOW Free Smoking Cessation Classes [phone]  CHOLESTEROL Know your levels; limit fat & cholesterol in your diet  Lipid Panel     Component Value Date/Time   CHOL 185 12/25/2021 0530   TRIG 117 12/25/2021 0530   HDL 52 12/25/2021 0530   CHOLHDL 3.6 12/25/2021 0530   VLDL 23 12/25/2021 0530   LDLCALC 110 (H) 12/25/2021 0530     Many patients benefit from treatment even if their cholesterol is at goal. Goal: Total Cholesterol (CHOL) less than 160 Goal:  Triglycerides (TRIG) less than 150 Goal:  HDL greater than 40 Goal:  LDL (LDLCALC) less than 100   BLOOD PRESSURE American Stroke Association blood pressure target is less that 120/80 mm/Hg  Your discharge blood  pressure is:  BP: (!) 154/55 Monitor your blood pressure Limit your salt and alcohol intake Many individuals will require more than one medication for high blood pressure  DIABETES (A1c is a blood sugar average for last 3 months) Goal HGBA1c is under 7% (HBGA1c is blood sugar average for last 3 months)  Diabetes: Diagnosis of diabetes:  Your A1c:6.5 %    Lab Results  Component Value Date   HGBA1C 6.5 (H) 12/25/2021    Your HGBA1c can be lowered with medications, healthy diet, and exercise. Check your blood sugar as directed by your physician Call your physician if you experience unexplained or low blood sugars.  PHYSICAL ACTIVITY/REHABILITATION Goal is 30 minutes at least 4 days per week  Activity: Increase activity slowly, Therapies: Physical Therapy: [place] [facility] Return to work: n/a Activity decreases your risk of heart attack and stroke and makes your heart stronger.  It helps control your weight and blood pressure; helps you relax and can improve your mood. Participate in a regular exercise program. Talk with your doctor about the best form of exercise for you (dancing, walking, swimming, cycling).  DIET/WEIGHT Goal is to maintain a healthy weight  Your discharge diet is:  Diet Order             DIET - DYS 1 Room service appropriate? Yes; Fluid consistency: Nectar Thick  Diet effective now                  Nectar thick liquids Your height is:  Height: 5\' 5"  (165.1 cm) Your current weight is: Weight:  60.8 kg Your Body Mass Index (BMI) is:  BMI (Calculated): 22.31 Following the type of diet specifically designed for you will help prevent another stroke. Your goal weight range is:   Your goal Body Mass Index (BMI) is 19-24. Healthy food habits can help reduce 3 risk factors for stroke:  High cholesterol, hypertension, and excess weight.  RESOURCES Stroke/Support Group:  Call [phone]   STROKE EDUCATION PROVIDED/REVIEWED AND GIVEN TO PATIENT Stroke warning signs and  symptoms How to activate emergency medical system (call 911). Medications prescribed at discharge. Need for follow-up after discharge. Personal risk factors for stroke. Pneumonia vaccine given: No Flu vaccine given: No My questions have been answered, the writing is legible, and I understand these instructions.  I will adhere to these goals & educational materials that have been provided to me after my discharge from the hospital.      My questions have been answered and I understand these instructions. I will adhere to these goals and the provided educational materials after my discharge from the hospital.  Patient/Caregiver Signature _______________________________ Date __________  Clinician Signature _______________________________________ Date __________  Please bring this form and your medication list with you to all your follow-up doctor's appointments.

## 2022-01-11 NOTE — Progress Notes (Signed)
Physical Therapy Session Note  Patient Details  Name: Patricia Dudley MRN: [id number] Date of Birth: 1930-03-08  Today's Date: 01/11/2022 PT Individual Time: 1300-1325 PT Individual Time Calculation (min): 25 min   Short Term Goals: Week 1:  PT Short Term Goal 1 (Week 1): Pt will roll side to side w/ max A PT Short Term Goal 2 (Week 1): Pt will transfer sup to sit w/ max A PT Short Term Goal 3 (Week 1): Pt will transfer sit to stand w/ max A PT Short Term Goal 4 (Week 1): Pt will amb w/ LRAD x 10'. Week 2:  PT Short Term Goal 1 (Week 2): pt will transfer bed<>chair with LRAD and mod A of 1 consistantly PT Short Term Goal 2 (Week 2): pt will transfer sit<>stand with LRAD and mod A of 1 consistantly PT Short Term Goal 3 (Week 2): pt will ambulate 25ft with LRAD and mod A of 1  Skilled Therapeutic Interventions/Progress Updates:   Received pt being transported back to room in TIS [facility], finishing eating with OT. Session with emphasis on functional mobility/transfers, generalized strengthening and endurance, dynamic standing balance, and gait training. Pt transported to/from room in TIS WC dependently. Sit<>stand with RW and mod A - cues for hand placement and pt ambulated 8ft with RW and mod A to mat. Pt required manual facilitation to steer/advance RW and occasional assist to advance RLE (but less than this morning). Pt required max cues for sequencing/motor control when turning to sit on mat. Stand<>pivot mat<>TIS WC with RW and mod A with assist to step back with RLE. Concluded session with pt sitting in TIS WC, needs within reach, and seatbelt alarm on.   Therapy Documentation Precautions:  Precautions Precautions: Fall Precaution Comments: aspiration precautions Restrictions Weight Bearing Restrictions: No  Therapy/Group: Individual Therapy Alfonse Alpers PT, DPT  01/11/2022, 12:28 PM

## 2022-01-11 NOTE — Plan of Care (Signed)
  Problem: RH Balance Goal: LTG Patient will maintain dynamic standing balance (PT) Description: LTG:  Patient will maintain dynamic standing balance with assistance during mobility activities (PT) Flowsheets (Taken 01/11/2022 0709) LTG: Pt will maintain dynamic standing balance during mobility activities with:: (upgraded due to improved initation, strength, and balance) Contact Guard/Touching assist Note: upgraded due to improved initation, strength, and balance   Problem: Sit to Stand Goal: LTG:  Patient will perform sit to stand with assistance level (PT) Description: LTG:  Patient will perform sit to stand with assistance level (PT) Flowsheets (Taken 01/11/2022 0709) LTG: PT will perform sit to stand in preparation for functional mobility with assistance level: (upgraded due to improved initation, strength, and balance) Contact Guard/Touching assist Note: upgraded due to improved initation, strength, and balance   Problem: RH Bed to Chair Transfers Goal: LTG Patient will perform bed/chair transfers w/assist (PT) Description: LTG: Patient will perform bed to chair transfers with assistance (PT). Flowsheets (Taken 01/11/2022 0709) LTG: Pt will perform Bed to Chair Transfers with assistance level: (upgraded due to improved initation, strength, and balance) Contact Guard/Touching assist Note: upgraded due to improved initation, strength, and balance   Problem: RH Furniture Transfers Goal: LTG Patient will perform furniture transfers w/assist (OT/PT) Description: LTG: Patient will perform furniture transfers  with assistance (OT/PT). Flowsheets (Taken 01/11/2022 0709) LTG: Pt will perform furniture transfers with assist:: (upgraded due to improved initation, strength, and balance) Contact Guard/Touching assist Note: upgraded due to improved initation, strength, and balance   Problem: RH Ambulation Goal: LTG Patient will ambulate in controlled environment (PT) Description: LTG: Patient will  ambulate in a controlled environment, # of feet with assistance (PT). Flowsheets (Taken 01/11/2022 0709) LTG: Pt will ambulate in controlled environ  assist needed:: (upgraded due to improved initation, strength, and balance) Contact Guard/Touching assist LTG: Ambulation distance in controlled environment: 74ft with LRAD Note: upgraded due to improved initation, strength, and balance Goal: LTG Patient will ambulate in home environment (PT) Description: LTG: Patient will ambulate in home environment, # of feet with assistance (PT). Flowsheets (Taken 01/11/2022 0709) LTG: Pt will ambulate in home environ  assist needed:: (upgraded due to improved initation, strength, and balance) Contact Guard/Touching assist LTG: Ambulation distance in home environment: 80ft with LRAD Note: upgraded due to improved initation, strength, and balance

## 2022-01-11 NOTE — Progress Notes (Signed)
Occupational Therapy Session Note  Patient Details  Name: Patricia Dudley MRN: [id number] Date of Birth: February 23, 1930  Today's Date: 01/11/2022 OT Individual Time: 4010-2725 OT Individual Time Calculation (min): 58 min    Short Term Goals: Week 1:  OT Short Term Goal 1 (Week 1): Pt will visually attend to R side of body with min A during functional task OT Short Term Goal 2 (Week 1): Pt will follow 1 step command in prep for ADL task, with use of multimodal cues and no more than min A OT Short Term Goal 3 (Week 1): Pt will roll either R or L during bed level toileting, with mod A and use of multimodal cues as needed  Skilled Therapeutic Interventions/Progress Updates:  Skilled OT intervention completed with focus on toileting, handwriting and self-feeding. Pt received seated in w/c, non-verbal for majority of the session, however attending to therapist with occasional smiles. Delayed processing and poor initiation still present, with use of gestures, HOH assist and tactile cueing utilized for increased carry through with motor tasks. Suspected pt to be incontinent at start of session, therefore pt sit > stand using RW with min A for stand after total A for hand placement on RW. Ambulated using RW with mod A with trunk flexion and anterior lean/dependency on BUE stability demonstrated with limited command following when attempting to physically facilitate safer and more efficient posture. RLE demonstrating slower activation this session, presumably from fatigue with PT in prior session, with light assist needed for advancing during gait. Pt found to be incontinent of BM and void, however active/continent BM and void while on commode (nursing aware for charting). Use of grab bar and HHA assist utilized for toileting vs RW as pt with inability to bring trunk upright despite total A cueing, with improvement demonstrated once RW taken away. Max A for toileting, with pt able to wipe front perineal with HOH  cueing. Stand pivot without AD but use of grab bar and min A to w/c. HOH assist needed for initiating hand hygiene seated at sink this session.  Transported in w/c with total A from room <> speech office. Seated at level table top, pt was able to verbalize the accurate spelling of her name with min guidance but 95% accuracy, then with hand over hand assist applied to R hand, was able to write her first and last name with Max-total A for the motor planning and initiation aspects. With pt set up in a functional/ergonomic eating position (upright in w/c at level table top around waist/stomach height) with minimized distractions, pt was able to self-drink nectar thick water through straw with use of cup in L hand, after therapist provided hand over hand assist for initiating the motor planning, then pt able to follow through with task including swallowing. Similar to drinking, pt demonstrated ability to hold utensil in R hand with no assist, and with hand over hand for initiating the task 100% of the time, pt was able to follow through the movement of bringing food to mouth and into mouth. Supervision for chewing and no pocketing noted. However suspected improvements might be made with food that pt actually desires as pt demonstrated full self-feeding with magic cup vs pureed food on plate. Pt was left upright in w/c in room, with belt alarm on and all needs in reach at end of session.    Therapy Documentation Precautions:  Precautions Precautions: Fall Precaution Comments: aspiration precautions Restrictions Weight Bearing Restrictions: No  Pain: No signs of distress  or pain 2/2 pt smiling despite nonverbal  Therapy/Group: Individual Therapy  Patricia Dudley 01/11/2022, 7:50 AM

## 2022-01-12 LAB — GLUCOSE, CAPILLARY
Glucose-Capillary: 209 mg/dL — ABNORMAL HIGH (ref 70–99)
Glucose-Capillary: 130 mg/dL — ABNORMAL HIGH (ref 70–99)
Glucose-Capillary: 308 mg/dL — ABNORMAL HIGH (ref 70–99)
Glucose-Capillary: 211 mg/dL — ABNORMAL HIGH (ref 70–99)

## 2022-01-12 MED ORDER — INSULIN GLARGINE-YFGN 100 UNIT/ML ~~LOC~~ SOLN
10.0000 [IU] | Freq: Every day | SUBCUTANEOUS | Status: DC
  Administered 2022-01-12: 10 [IU] via SUBCUTANEOUS
  Filled 2022-01-12 (×2): qty 0.1

## 2022-01-12 MED ORDER — EMPAGLIFLOZIN 10 MG PO TABS: 10.0000 mg | ORAL_TABLET | Freq: Every day | ORAL | Status: DC

## 2022-01-12 NOTE — Progress Notes (Signed)
Physical Therapy Session Note  Patient Details  Name: Patricia Dudley MRN: [id number] Date of Birth: [birth year]  Today's Date: 01/12/2022 PT Individual Time: 1023-1104 PT Individual Time Calculation (min): 41 min   Short Term Goals: Week 2:  PT Short Term Goal 1 (Week 2): pt will transfer bed<>chair with LRAD and mod A of 1 consistantly PT Short Term Goal 2 (Week 2): pt will transfer sit<>stand with LRAD and mod A of 1 consistantly PT Short Term Goal 3 (Week 2): pt will ambulate 53ft with LRAD and mod A of 1  Skilled Therapeutic Interventions/Progress Updates:    Pt received R sidelying in bed with her head resting on the bedrail. Pt continues to remain non-verbal and only response therapist received throughout session was pt intermittently grinning back at therapist, despite attempts at yes/no questions - pt appears agreeable to session. Pt found to be soiled in urine and with incontinent BM. Supine>sitting R EOB with max/total assist to bring B LEs over to EOB, bring trunk upright, and scoot to EOB. Initially pt requires mod assist for sitting balance due to R lean but improved once scooted forward to EOB and using B UE support. Pt continues to have slow processing with delayed motor planning and initiation of motor task after being cued.  Sit>stand EOB>stedy with mod assist for rising to stand and pt responding well to tactile cuing/facilitation at trunk to start weight shifting forward - pt would consistently start to stand and then sit back down requiring cuing to come fully upright and stay upright.  Stedy transfer in/out bathroom to [facility] over toilet.  Sit<>stand to/from stedy seat progressed to min assist during session with pt appearing to have improved understanding of the task and use of stedy.  Pt unable to void further in bathroom. While sitting on BSC over toilet pt with consistent anterior trunk lean relying heavily on B UE support on stedy bar to maintain upright.    Standing  with CGA/min assist in stedy during dependent assist LB clothing management and peri-care to ensure cleanliness. Transported back to TIS w/c.  Sitting in TIS w/c threaded on pants with total assist for time management. Sit>stand w/c>RW with light mod assist for lifting to stand and balance with max assist to pull pants up over hips.  Pt left tilted back in TIS w/c in the care of SLP.    Therapy Documentation Precautions:  Precautions Precautions: Fall Precaution Comments: aspiration precautions Restrictions Weight Bearing Restrictions: No   Pain: No indications of pain during session.    Therapy/Group: Individual Therapy  Tawana Scale , PT, DPT, NCS, CSRS 01/12/2022, 7:55 AM

## 2022-01-12 NOTE — Progress Notes (Signed)
PROGRESS NOTE   Subjective/Complaints: No new complaints this morning Worked with SLP Denies pain Limited by fatigue  ROS: Limited by language, unable to answer commands.  Objective:   No results found. No results for input(s): WBC, HGB, HCT, PLT in the last 72 hours.  No results for input(s): NA, K, CL, CO2, GLUCOSE, BUN, CREATININE, CALCIUM in the last 72 hours.   Intake/Output Summary (Last 24 hours) at 01/12/2022 1344 Last data filed at 01/12/2022 0900 Gross per 24 hour  Intake 240 ml  Output --  Net 240 ml        Physical Exam: Vital Signs Blood pressure (!) 144/60, pulse (!) 47, temperature 98 F (36.7 C), resp. rate 16, height 5\' 5"  (1.651 m), weight 55.8 kg, SpO2 99 %.  Gen: no distress, normal appearing, BMI 20.47, fatigued HENT: oral mucosa pink and moist, NCAT Eyes:     General:        Right eye: No discharge.        Left eye: No discharge.     Comments: Left lid closed R eye lid more open  Mouth: denitrues in place Cardiovascular:     Rate and Rhythm: irregular, a fib, had episodes of bradycardia    Heart sounds: Normal heart sounds. No murmur heard.   No gallop.  Pulmonary:     Effort: Pulmonary effort is normal. No respiratory distress.     Breath sounds: Normal breath sounds. No wheezing or rales.  Abdominal:     General: Bowel sounds are normal. There is no distension.     Palpations: Abdomen is soft.     Tenderness: There is no abdominal tenderness.  Musculoskeletal:     Cervical back: Neck supple. Protracted posture    Comments: Limited participation in movement today, squeeze hand: L grip/biceps 5-/5 BUE: 3/5 grip/biceps.  Would not participate in RLE and LLE movement. Skin:    General: Skin is warm and dry.  Neurological:     Mental Status: She is alert.     Comments: Squeezes hands on command, did protrude tongue on command today, made attempt to smile when asked. No verbal  expression shaking head yes or no-  Was able to remain awake and alert during exam today. +echolalia Psychiatric:     Comments: Smiling,  +echolalia GU: continent of bladder at times  Assessment/Plan: 1. Functional deficits which require 3+ hours per day of interdisciplinary therapy in a comprehensive inpatient rehab setting. Physiatrist is providing close team supervision and 24 hour management of active medical problems listed below. Physiatrist and rehab team continue to assess barriers to discharge/monitor patient progress toward functional and medical goals  Care Tool:  Bathing    Body parts bathed by patient: Left lower leg, Right lower leg, Left upper leg, Right upper leg, Front perineal area   Body parts bathed by helper: Right arm, Left arm, Chest, Abdomen, Buttocks, Face Body parts n/a: Front perineal area, Buttocks   Bathing assist Assist Level: Total Assistance - Patient < 25%     Upper Body Dressing/Undressing Upper body dressing   What is the patient wearing?: Pull over shirt    Upper body assist Assist Level: Total  Assistance - Patient < 25%    Lower Body Dressing/Undressing Lower body dressing      What is the patient wearing?: Incontinence brief, Pants     Lower body assist Assist for lower body dressing: Total Assistance - Patient < 25%     Toileting Toileting    Toileting assist Assist for toileting: Total Assistance - Patient < 25%     Transfers Chair/bed transfer  Transfers assist     Chair/bed transfer assist level: Moderate Assistance - Patient 50 - 74%     Locomotion Ambulation   Ambulation assist   Ambulation activity did not occur: Safety/medical concerns  Assist level: Moderate Assistance - Patient 50 - 74% Assistive device: Walker-rolling Max distance: 90ft   Walk 10 feet activity   Assist  Walk 10 feet activity did not occur: Safety/medical concerns  Assist level: Moderate Assistance - Patient - 50 - 74% Assistive  device: Walker-rolling   Walk 50 feet activity   Assist Walk 50 feet with 2 turns activity did not occur: Safety/medical concerns         Walk 150 feet activity   Assist Walk 150 feet activity did not occur: Safety/medical concerns         Walk 10 feet on uneven surface  activity   Assist Walk 10 feet on uneven surfaces activity did not occur: Safety/medical concerns         Wheelchair     Assist Is the patient using a wheelchair?: No   Wheelchair activity did not occur: Safety/medical concerns         Wheelchair 50 feet with 2 turns activity    Assist    Wheelchair 50 feet with 2 turns activity did not occur: Safety/medical concerns       Wheelchair 150 feet activity     Assist  Wheelchair 150 feet activity did not occur: Safety/medical concerns       Blood pressure (!) 144/60, pulse (!) 47, temperature 98 F (36.7 C), resp. rate 16, height 5\' 5"  (1.651 m), weight 55.8 kg, SpO2 99 %.    Medical Problem List and Plan: 1. 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             -patient may  shower             -ELOS/Goals: ~ 3 weeks- min A  -Continue CIR 2.  Anemia: resume Heparin  TID. Transfused 1U on 5/15. Post-transfusion hemoglobin trending upward nicely, repeat Monday              -antiplatelet therapy: aspirin 81 mg daily 3. Pain Management: Tylenol prn 4. Mood: LCSW to evaluate and provide emotional support             -antipsychotic agents: n/a 5. Neuropsych: This patient is not capable of making decisions on her own behalf. 6. Skin/Wound Care: Routine skin care checks 7. Dysphagia             -- continue Dysphagia 1 diet with thin liquids.  Crushed meds with applesauce.  SLP evaluation.             -- Continue nutritional supplements, B12, MVI 8: Paroxysmal atrial fibrillation: continue Lopressor 50 mg BID --home torsemide on hold --no AC due to small bowel  AVMs 5/13 Rate controlled, some irregularity of rate heard on auscultation.    01/12/2022    4:20 AM 01/11/2022    7:40 PM 01/11/2022  3:14 PM  Vitals with BMI  Systolic [phone]  Diastolic 60 57 94  Pulse 47 96 58    9: Diabetes mellitus: CBGs qAC,qHS; monitor intake and activity --continue SSI --increase glipizide back to 10mg  (home dose) [place] 10U started HS D/c Ensure between meals 10: Hypertension: increase lisinopril to 40 mg, amlodipine 10 mg, Lopressor. Repeat creatinine on Monday given increase in Lisinopril.  5/13 BP controlled today. 5/14 some variability in B/P, with mild elevation, some episodes of bradycardia (HR 46 at 051 today). -D/c lisinopril today since BUN 28->68 and Crt 2.28->3.20, with increase in past 2 days.  Hold lopressor with bradycardia episodes.  Will see how B/P trends on amlodipine 10 mg, may consider adding hydralazine if B/p is elevated.   -now hypotensive, change fluids to NS HS 12 hours      01/12/2022    4:20 AM 01/11/2022    7:40 PM 01/11/2022    3:14 PM  Vitals with BMI  Systolic [phone]  Diastolic 60 57 94  Pulse 47 96 58    11: Hyperlipidemia: continue rosuvastatin 10 mg 12: Moderate malnutrition: continue supplements 13: Chronic kidney disease stage 3b: follow-up BMP     -5/13 B/P is in the 120's today. Pt had lisinopril increased to 40 mg yesterday for elevated B/P.  Since lisinopril may increase Crt, decrease lisinopril to 20 mg daily today and push po intake and repeat BMP. -5/14 Crt up to 3.2 and BUN up to 68.  Fluid bolus (1L NS) and continuous fluid (0.45 NS at 75 ml/hr) given for rise in BUN and Crt. -d/c lisinopril (per Dr. Christella Noa) since could be a possible cause of elevated Crt . -Repeat creatinine today     Latest Ref Rng & Units 01/09/2022    1:25 PM 01/07/2022    7:48 AM 01/06/2022    5:53 AM  BMP  Glucose 70 - 99 mg/dL 183   185   191    BUN 8 - 23 mg/dL 47   60   68    Creatinine 0.44 - 1.00 mg/dL 2.69    2.78   3.20    Sodium 135 - 145 mmol/L 142   140   144    Potassium 3.5 - 5.1 mmol/L 4.5   4.7   4.7    Chloride 98 - 111 mmol/L 113   112   110    CO2 22 - 32 mmol/L 19   23   25     Calcium 8.9 - 10.3 mg/dL 8.6   7.5   8.1      14: Glaucoma: continue latanoprost drops 15: Hypothyroidism: continue Synthroid 16: Diarrhea/loose stools: monitor; getting loperamide 2 mg daily (BID prn) -5/13 Continue current regimen. 17: Bowel and bladder incontinence: start bowel and bladder program 18. Daytime somnolence: [employer] and SW if therapy can be scheduled a little later in the day and if daughters can be present to help motivate her 72. Bradycardia: discontinue lopressor.  20. History of GI bleed: start protonix.  21. Hypotension: resolved, hemoglobin reviewed and stable 22. Neurogenic bladder: bladder program ordered starting 5/11. 23. Stool incontinence: fiber added to bulk stool 24. Screening for vitamin D deficiency: check vitamin D level on Monday 25. Fatigue: check iron level on Monday   LOS: 9 days A FACE TO FACE EVALUATION WAS PERFORMED  Martha Clan P Patricia Dudley 01/12/2022, 1:44 PM

## 2022-01-12 NOTE — Progress Notes (Signed)
Speech Language Pathology Daily Session Note  Patient Details  Name: Patricia Dudley MRN: [id number] Date of Birth: 06/16/1930  Today's Date: 01/12/2022 SLP Individual Time: 1105-1200 SLP Individual Time Calculation (min): 55 min  Short Term Goals: Week 2: SLP Short Term Goal 1 (Week 2): Pt will follow 3 commands within a functional context given Mod multimodal A. SLP Short Term Goal 2 (Week 2): Pt will communicate wants, needs, ideas, and thoughts x 5 given Max  A for use of mulitmodal communication methods. SLP Short Term Goal 3 (Week 2): Pt will consume current diet with minimal overt s/s of aspiration with Mod verbal cues for use of swallowing compensatory strategies. SLP Short Term Goal 4 (Week 2): Pt will maintain sustianed attention for 5 minutes with Mod A.  Skilled Therapeutic Interventions: Pt seen for skilled ST with focus on speech and swallow goals, pt upright and a bit fatigued in TIS wheelchair following PT session. PT reports pt was "spitting out brown stuff" during their session and nursing reports significant difficulty with meds this AM (took ~15 minutes for 1 small pill). SLP providing oral care with suction toothbrush with pt requiring Max A multimodal cues to open mouth. SLP presenting choice of 2 NTLs for intake and requiring hand over hand assist to initiate pointing to desired option. SLP facilitating intake of NTLs via spoon by providing max A verbal and tactile cues for patient to accept spoon. Pt consuming <1 oz NTL this date with significant oral holding at times, no overt s/s aspiration. Pt responding to basic yes/no questions with head nods <20% of time, likely limited by fatigue at this time. Pt left in TIS wheelchair with belt alarm activated and all needs within reach. Cont ST POC.  Pain Pain Assessment Pain Scale: Faces Pain Score: 0-No pain Faces Pain Scale: No hurt  Therapy/Group: Individual Therapy  Dewaine Conger 01/12/2022, 11:33 AM

## 2022-01-13 LAB — GLUCOSE, CAPILLARY
Glucose-Capillary: 220 mg/dL — ABNORMAL HIGH (ref 70–99)
Glucose-Capillary: 164 mg/dL — ABNORMAL HIGH (ref 70–99)
Glucose-Capillary: 220 mg/dL — ABNORMAL HIGH (ref 70–99)
Glucose-Capillary: 151 mg/dL — ABNORMAL HIGH (ref 70–99)

## 2022-01-13 MED ORDER — INSULIN GLARGINE-YFGN 100 UNIT/ML ~~LOC~~ SOLN
11.0000 [IU] | Freq: Every day | SUBCUTANEOUS | Status: DC
  Administered 2022-01-13 – 2022-01-16 (×4): 11 [IU] via SUBCUTANEOUS
  Filled 2022-01-13 (×5): qty 0.11

## 2022-01-13 MED ORDER — MAGNESIUM GLUCONATE 500 MG PO TABS
250.0000 mg | ORAL_TABLET | Freq: Every day | ORAL | Status: DC
  Administered 2022-01-13 – 2022-01-29 (×16): 250 mg via ORAL
  Filled 2022-01-13 (×16): qty 1

## 2022-01-13 NOTE — Progress Notes (Signed)
PROGRESS NOTE   Subjective/Complaints: Sleepy this morning.  No verbal output Max A for sit and ModA scoot to edge of bed Ambulated 5 feet with RW  ROS: Limited by language, unable to answer commands.  Objective:   No results found. No results for input(s): WBC, HGB, HCT, PLT in the last 72 hours.  No results for input(s): NA, K, CL, CO2, GLUCOSE, BUN, CREATININE, CALCIUM in the last 72 hours.   Intake/Output Summary (Last 24 hours) at 01/13/2022 1929 Last data filed at 01/13/2022 1400 Gross per 24 hour  Intake 90 ml  Output 3 ml  Net 87 ml        Physical Exam: Vital Signs Blood pressure (!) 156/93, pulse 97, temperature 98.1 F (36.7 C), resp. rate 17, height 5\' 5"  (1.651 m), weight 55.8 kg, SpO2 99 %.  Gen: no distress, normal appearing, BMI 20.47, fatigued HENT: oral mucosa pink and moist, NCAT Eyes:     General:        Right eye: No discharge.        Left eye: No discharge.     Comments: Left lid closed R eye lid more open  Mouth: denitrues in place Cardiovascular:     Rate and Rhythm: irregular, a fib, had episodes of bradycardia    Heart sounds: Normal heart sounds. No murmur heard.   No gallop.  Pulmonary:     Effort: Pulmonary effort is normal. No respiratory distress.     Breath sounds: Normal breath sounds. No wheezing or rales.  Abdominal:     General: Bowel sounds are normal. There is no distension.     Palpations: Abdomen is soft.     Tenderness: There is no abdominal tenderness.  Musculoskeletal:     Cervical back: Neck supple. Protracted posture    Comments: Limited participation in movement today, squeeze hand: L grip/biceps 5-/5 BUE: 3/5 grip/biceps.  Would not participate in RLE and LLE movement. Skin:    General: Skin is warm and dry.  Neurological:     Mental Status: She is alert.     Comments: Squeezes hands on command, did protrude tongue on command today, made attempt to smile  when asked. No verbal expression shaking head yes or no-  Was able to remain awake and alert during exam today Ambulating ModA with RW. +echolalia Psychiatric:     Comments: Smiling,  +echolalia GU: continent of bladder at times  Assessment/Plan: 1. Functional deficits which require 3+ hours per day of interdisciplinary therapy in a comprehensive inpatient rehab setting. Physiatrist is providing close team supervision and 24 hour management of active medical problems listed below. Physiatrist and rehab team continue to assess barriers to discharge/monitor patient progress toward functional and medical goals  Care Tool:  Bathing    Body parts bathed by patient: Left lower leg, Right lower leg, Left upper leg, Right upper leg, Front perineal area   Body parts bathed by helper: Right arm, Left arm, Chest, Abdomen, Buttocks, Face Body parts n/a: Front perineal area, Buttocks   Bathing assist Assist Level: Total Assistance - Patient < 25%     Upper Body Dressing/Undressing Upper body dressing   What is the  patient wearing?: Pull over shirt    Upper body assist Assist Level: Total Assistance - Patient < 25%    Lower Body Dressing/Undressing Lower body dressing      What is the patient wearing?: Incontinence brief, Pants     Lower body assist Assist for lower body dressing: Total Assistance - Patient < 25%     Toileting Toileting    Toileting assist Assist for toileting: Total Assistance - Patient < 25%     Transfers Chair/bed transfer  Transfers assist     Chair/bed transfer assist level: Moderate Assistance - Patient 50 - 74%     Locomotion Ambulation   Ambulation assist   Ambulation activity did not occur: Safety/medical concerns  Assist level: Moderate Assistance - Patient 50 - 74% Assistive device: Walker-rolling Max distance: 12   Walk 10 feet activity   Assist  Walk 10 feet activity did not occur: Safety/medical concerns  Assist level:  Moderate Assistance - Patient - 50 - 74% Assistive device: Walker-rolling   Walk 50 feet activity   Assist Walk 50 feet with 2 turns activity did not occur: Safety/medical concerns         Walk 150 feet activity   Assist Walk 150 feet activity did not occur: Safety/medical concerns         Walk 10 feet on uneven surface  activity   Assist Walk 10 feet on uneven surfaces activity did not occur: Safety/medical concerns         Wheelchair     Assist Is the patient using a wheelchair?: No   Wheelchair activity did not occur: Safety/medical concerns         Wheelchair 50 feet with 2 turns activity    Assist    Wheelchair 50 feet with 2 turns activity did not occur: Safety/medical concerns       Wheelchair 150 feet activity     Assist  Wheelchair 150 feet activity did not occur: Safety/medical concerns       Blood pressure (!) 156/93, pulse 97, temperature 98.1 F (36.7 C), resp. rate 17, height 5\' 5"  (1.651 m), weight 55.8 kg, SpO2 99 %.    Medical Problem List and Plan: 1. 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             -patient may  shower             -ELOS/Goals: ~ 3 weeks- min A  -Continue CIR 2.  Anemia: resume Heparin [place] TID. Transfused 1U on 5/15. Post-transfusion hemoglobin trending upward nicely, repeat Monday              -antiplatelet therapy: aspirin 81 mg daily 3. Pain Management: Tylenol prn 4. Mood: LCSW to evaluate and provide emotional support             -antipsychotic agents: n/a 5. Neuropsych: This patient is not capable of making decisions on her own behalf. 6. Skin/Wound Care: Routine skin care checks 7. Dysphagia             -- continue Dysphagia 1 diet with thin liquids.  Crushed meds with applesauce.  SLP evaluation.             -- Continue nutritional supplements, B12, MVI 8: Paroxysmal atrial fibrillation: continue Lopressor 50 mg BID --home  torsemide on hold --no AC due to small bowel AVMs Add magnesium gluconate 250mg  HS    01/13/2022    2:37 PM 01/12/2022  8:48 PM 01/12/2022    4:20 AM  Vitals with BMI  Systolic [phone]  Diastolic 93 88 60  Pulse 97 106 47    9: Diabetes mellitus: CBGs qAC,qHS; monitor intake and activity --continue SSI --increase glipizide back to 10mg  (home dose) Increase Semglee to 11U started HS D/c Ensure between meals 10: Hypertension: d/c Lisinopril due to worsening kidney function, continue amlodipine 10 mg, Lopressor. Repeat creatinine on Monday given increase in Lisinopril.  5/13 BP controlled today. 5/14 some variability in B/P, with mild elevation, some episodes of bradycardia (HR 46 at 051 today). -D/c lisinopril today since BUN 28->68 and Crt 2.28->3.20, with increase in past 2 days.  Hold lopressor with bradycardia episodes.  Will see how B/P trends on amlodipine 10 mg, may consider adding hydralazine if B/p is elevated.   -now hypotensive, change fluids to NS HS 12 hours      01/13/2022    2:37 PM 01/12/2022    8:48 PM 01/12/2022    4:20 AM  Vitals with BMI  Systolic [phone]  Diastolic 93 88 60  Pulse 97 106 47    11: Hyperlipidemia: continue rosuvastatin 10 mg 12: Moderate malnutrition: continue supplements 13: Chronic kidney disease stage 3b: follow-up BMP     -5/13 B/P is in the 120's today. Pt had lisinopril increased to 40 mg yesterday for elevated B/P.  Since lisinopril may increase Crt, decrease lisinopril to 20 mg daily today and push po intake and repeat BMP. -5/14 Crt up to 3.2 and BUN up to 68.  Fluid bolus (1L NS) and continuous fluid (0.45 NS at 75 ml/hr) given for rise in BUN and Crt. -d/c lisinopril (per Dr. Christella Noa) since could be a possible cause of elevated Crt . -Repeat creatinine today     Latest Ref Rng & Units 01/09/2022    1:25 PM 01/07/2022    7:48 AM 01/06/2022    5:53 AM  BMP  Glucose 70 - 99 mg/dL 183   185   191    BUN 8 - 23 mg/dL 47    60   68    Creatinine 0.44 - 1.00 mg/dL 2.69   2.78   3.20    Sodium 135 - 145 mmol/L 142   140   144    Potassium 3.5 - 5.1 mmol/L 4.5   4.7   4.7    Chloride 98 - 111 mmol/L 113   112   110    CO2 22 - 32 mmol/L 19   23   25     Calcium 8.9 - 10.3 mg/dL 8.6   7.5   8.1      14: Glaucoma: continue latanoprost drops 15: Hypothyroidism: continue Synthroid 16: Diarrhea/loose stools: monitor; getting loperamide 2 mg daily (BID prn) -5/13 Continue current regimen. 17: Bowel and bladder incontinence: start bowel and bladder program 18. Daytime somnolence: [employer] and SW if therapy can be scheduled a little later in the day and if daughters can be present to help motivate her 105. Bradycardia: discontinue lopressor.  20. History of GI bleed: start protonix.  21. Hypotension: resolved, hemoglobin reviewed and stable 22. Neurogenic bladder: bladder program ordered starting 5/11. 23. Stool incontinence: fiber added to bulk stool 24. Screening for vitamin D deficiency: check vitamin D level on Monday 25. Fatigue: check iron level on Monday   LOS: 10 days A FACE TO FACE EVALUATION WAS PERFORMED  Clide Deutscher Takoda Janowiak 01/13/2022, 7:29 PM

## 2022-01-13 NOTE — Progress Notes (Signed)
Physical Therapy Session Note  Patient Details  Name: Patricia Dudley MRN: [id number] Date of Birth: Feb 13, 1930  Today's Date: 01/13/2022 PT Individual Time: 1006-1105 PT Individual Time Calculation (min): 59 min   Short Term Goals: Week 1:  PT Short Term Goal 1 (Week 1): Pt will roll side to side w/ max A PT Short Term Goal 2 (Week 1): Pt will transfer sup to sit w/ max A PT Short Term Goal 3 (Week 1): Pt will transfer sit to stand w/ max A PT Short Term Goal 4 (Week 1): Pt will amb w/ LRAD x 10'.  Skilled Therapeutic Interventions/Progress Updates: Pt presents R sidelying and non-verbal.  Pt required max A for sidelying to sit, but then requires mod A and cueing, tactile and verbal to assist scooting to EOB.  Pt then required CGA to occasional Min A for seated balance, flexing forward.  Pt assists picking up LLE for threading pants over, but not RLE.  Pt transferred sit to stand w/ max initially and then improving to mod A to RW.  Noted smear of BM on bed.  Pt amb into BR w/ RW and mod to max A, especially for posture and walker management.  Pt transferred onto toilet in 2 moves as sitting too far forward.  Pt transferred sit to stand on Stedy w/ max A after manual assist to place BUEs, but pt continuing to have BM.  Pt transferred to sitting and completed.  NT to chart incontinent and continent BM.  Pt transferred sit to stand w/ mod A for total A for pericare, but continued attempts to sit.  Total A for pulling up clean brief and then negotiated Stedy to recliner.  PT threaded pants over B feet and mod A for sit to stand to RW and completed over hips.  Pt amb x 5' w/ RW and mod A to bed.  Pt scooted self back into bed w/ manual cueing and then max A for sit to supine.  Pt performed bridging to center of bed after assist placing in hooklying position w/ facilitation on lower thighs.  Pants removed in same fashion.  Bed alarm on and all needs in reach.     Therapy Documentation Precautions:   Precautions Precautions: Fall Precaution Comments: aspiration precautions Restrictions Weight Bearing Restrictions: No General:   Vital Signs:  Pain:does not appear to have pain during session.      Therapy/Group: Individual Therapy  Ladoris Gene 01/13/2022, 11:07 AM

## 2022-01-14 DIAGNOSIS — I10 Essential (primary) hypertension: Secondary | ICD-10-CM

## 2022-01-14 DIAGNOSIS — N183 Chronic kidney disease, stage 3 unspecified: Secondary | ICD-10-CM

## 2022-01-14 DIAGNOSIS — R1310 Dysphagia, unspecified: Secondary | ICD-10-CM

## 2022-01-14 LAB — CBC
HCT: 31.2 % — ABNORMAL LOW (ref 36.0–46.0)
Hemoglobin: 9.9 g/dL — ABNORMAL LOW (ref 12.0–15.0)
MCH: 28 pg (ref 26.0–34.0)
MCHC: 31.7 g/dL (ref 30.0–36.0)
MCV: 88.4 fL (ref 80.0–100.0)
Platelets: 521 10*3/uL — ABNORMAL HIGH (ref 150–400)
RBC: 3.53 MIL/uL — ABNORMAL LOW (ref 3.87–5.11)
RDW: 16.3 % — ABNORMAL HIGH (ref 11.5–15.5)
WBC: 8.5 10*3/uL (ref 4.0–10.5)
nRBC: 0 % (ref 0.0–0.2)

## 2022-01-14 LAB — IRON AND TIBC
Iron: 68 ug/dL (ref 28–170)
Saturation Ratios: 20 % (ref 10.4–31.8)
TIBC: 349 ug/dL (ref 250–450)
UIBC: 281 ug/dL

## 2022-01-14 LAB — VITAMIN D 25 HYDROXY (VIT D DEFICIENCY, FRACTURES): Vit D, 25-Hydroxy: 50.78 ng/mL (ref 30–100)

## 2022-01-14 LAB — GLUCOSE, CAPILLARY
Glucose-Capillary: 107 mg/dL — ABNORMAL HIGH (ref 70–99)
Glucose-Capillary: 226 mg/dL — ABNORMAL HIGH (ref 70–99)
Glucose-Capillary: 267 mg/dL — ABNORMAL HIGH (ref 70–99)
Glucose-Capillary: 140 mg/dL — ABNORMAL HIGH (ref 70–99)

## 2022-01-14 MED ORDER — SODIUM CHLORIDE 0.9 % IV SOLN: INTRAVENOUS | Status: DC

## 2022-01-14 MED ORDER — DEXTROSE-NACL 5-0.45 % IV SOLN: INTRAVENOUS | Status: DC

## 2022-01-14 NOTE — Progress Notes (Signed)
Physical Therapy Session Note  Patient Details  Name: Patricia Dudley MRN: [id number] Date of Birth: 1929-12-17  Today's Date: 01/14/2022 PT Individual Time: 0730-0826 PT Individual Time Calculation (min): 56 min   Short Term Goals: Week 1:  PT Short Term Goal 1 (Week 1): Pt will roll side to side w/ max A PT Short Term Goal 2 (Week 1): Pt will transfer sup to sit w/ max A PT Short Term Goal 3 (Week 1): Pt will transfer sit to stand w/ max A PT Short Term Goal 4 (Week 1): Pt will amb w/ LRAD x 10'. Week 2:  PT Short Term Goal 1 (Week 2): pt will transfer bed<>chair with LRAD and mod A of 1 consistantly PT Short Term Goal 2 (Week 2): pt will transfer sit<>stand with LRAD and mod A of 1 consistantly PT Short Term Goal 3 (Week 2): pt will ambulate 74ft with LRAD and mod A of 1  Skilled Therapeutic Interventions/Progress Updates:   Received pt supine in bed asleep, pt required max verbal and tactile cues to arouse. Pt remains non-verbal and did not appear to be in any pain during session. Of note, motor planning/sequencing more impaired today compared to last week, unsure if fatigue and early therapy session impacting factor. Session with emphasis on functional mobility/transfers, dressing, toileting, standing tolerance, and eating. RN arrived and reported pt had not toileted since 12am; therefore goal of session to toilet. Pt did not initiate any movement to sit up, therefore transferred semi-reclined<>sitting EOB with HOB elevated and total A and required max A to scoot hips to EOB. Pt required mod/max A fading to CGA for static sitting balance due to multiple posterior LOB. Doffed nightgown and donned clean pull over shirt with max A, however once arms were mostly through sleeves, pt able to initiate getting arm through. Pt stood from EOB with RW x 2 trials with max A (but required multiple attempts due to poor motor planning), but immediately sat back down, ultimately unable to stand<>pivot to  WC. Retrieved [place], and pt required numerous attempts and max A to stand in [place] from elevated EOB and transported to toilet with bedside commode over top dependently. Semi-sit<>stand<>sitting on commode with total A (specifically for hip extension) and removed brief with total A. Pt dry but immediately voided upon sitting on commode - NT made aware. Donned clean brief and pants sitting on commode with max A but pt did initiate pulling pants to thighs on L side. Sit<>stand from commode in [place] with max A and increased time and required total A to pull brief/pants over hips. Dependent transfer to TIS WC and breakfast tray arrived. Provided total A for feeding as well as mod fading to min cues to open mouth to accept bites and max cues to chew completely. Worked on [place], practicing with hand over hand guidance to bring food to mouth using RUE but pt only opened mouth when therapist fed her. Pt able to consume a few bites of grits, 2 bites of sausage, and 1 bite of eggs. Due to time restrictions, handed over feeding to NT. Concluded session with pt sitting in TIS WC, needs within reach, and seatbelt alarm on. NT assisting with feeding.   Therapy Documentation Precautions:  Precautions Precautions: Fall Precaution Comments: aspiration precautions Restrictions Weight Bearing Restrictions: No  Therapy/Group: Individual Therapy Alfonse Alpers PT, DPT  01/14/2022, 6:48 AM

## 2022-01-14 NOTE — Progress Notes (Signed)
PROGRESS NOTE   Subjective/Complaints: Aphasic, no concerns were elicited.  ROS: Limited by language, unable to answer commands.  Objective:   No results found. Recent Labs    01/14/22 0618  WBC 8.5  HGB 9.9*  HCT 31.2*  PLT 521*    No results for input(s): NA, K, CL, CO2, GLUCOSE, BUN, CREATININE, CALCIUM in the last 72 hours.   Intake/Output Summary (Last 24 hours) at 01/14/2022 0734 Last data filed at 01/13/2022 1400 Gross per 24 hour  Intake 90 ml  Output --  Net 90 ml         Physical Exam: Vital Signs Blood pressure (!) 142/66, pulse (!) 49, temperature 98 F (36.7 C), resp. rate 16, height 5\' 5"  (1.651 m), weight 55.8 kg, SpO2 97 %.  Gen: no distress, normal appearing, BMI 20.47, fatigued HENT: oral mucosa pink and moist, NCAT Eyes:     General:    In bed, covers mouth with blanket    Right eye: No discharge.        Left eye: No discharge.     Comments: Left lid closed R eye lid more open  Mouth: denitrues in place, Mucus membranes a little dry Cardiovascular:     Rate and Rhythm: irregular, a fib, had episodes of bradycardia    Heart sounds: Normal heart sounds. No murmur heard.   No gallop.  Pulmonary:     Effort: Pulmonary effort is normal. No respiratory distress.     Breath sounds: Normal breath sounds. No wheezing or rales.  Abdominal:     General: Bowel sounds are normal. There is no distension.     Palpations: Abdomen is soft.     Tenderness: There is no abdominal tenderness.  Musculoskeletal:     Cervical back: Neck supple. Protracted posture    Comments: Limited participation in movement today, squeeze hand: L grip/biceps 5-/5 BUE: 3/5 grip/biceps.  Would not participate in RLE and LLE movement. Skin:    General: Skin is warm and dry.  Neurological:     Mental Status: She is alert.     Comments: Squeezes hands on command, did protrude tongue on command today, made attempt to smile  when asked. No verbal expression shaking head yes or no-  Was able to remain awake and alert during exam today Ambulating ModA with RW. +echolalia Psychiatric:     Comments: Smiling,  +echolalia GU: continent of bladder at times  Assessment/Plan: 1. Functional deficits which require 3+ hours per day of interdisciplinary therapy in a comprehensive inpatient rehab setting. Physiatrist is providing close team supervision and 24 hour management of active medical problems listed below. Physiatrist and rehab team continue to assess barriers to discharge/monitor patient progress toward functional and medical goals  Care Tool:  Bathing    Body parts bathed by patient: Left lower leg, Right lower leg, Left upper leg, Right upper leg, Front perineal area   Body parts bathed by helper: Right arm, Left arm, Chest, Abdomen, Buttocks, Face Body parts n/a: Front perineal area, Buttocks   Bathing assist Assist Level: Total Assistance - Patient < 25%     Upper Body Dressing/Undressing Upper body dressing   What is  the patient wearing?: Pull over shirt    Upper body assist Assist Level: Total Assistance - Patient < 25%    Lower Body Dressing/Undressing Lower body dressing      What is the patient wearing?: Incontinence brief, Pants     Lower body assist Assist for lower body dressing: Total Assistance - Patient < 25%     Toileting Toileting    Toileting assist Assist for toileting: Total Assistance - Patient < 25%     Transfers Chair/bed transfer  Transfers assist     Chair/bed transfer assist level: Moderate Assistance - Patient 50 - 74%     Locomotion Ambulation   Ambulation assist   Ambulation activity did not occur: Safety/medical concerns  Assist level: Moderate Assistance - Patient 50 - 74% Assistive device: Walker-rolling Max distance: 12   Walk 10 feet activity   Assist  Walk 10 feet activity did not occur: Safety/medical concerns  Assist level:  Moderate Assistance - Patient - 50 - 74% Assistive device: Walker-rolling   Walk 50 feet activity   Assist Walk 50 feet with 2 turns activity did not occur: Safety/medical concerns         Walk 150 feet activity   Assist Walk 150 feet activity did not occur: Safety/medical concerns         Walk 10 feet on uneven surface  activity   Assist Walk 10 feet on uneven surfaces activity did not occur: Safety/medical concerns         Wheelchair     Assist Is the patient using a wheelchair?: No   Wheelchair activity did not occur: Safety/medical concerns         Wheelchair 50 feet with 2 turns activity    Assist    Wheelchair 50 feet with 2 turns activity did not occur: Safety/medical concerns       Wheelchair 150 feet activity     Assist  Wheelchair 150 feet activity did not occur: Safety/medical concerns       Blood pressure (!) 142/66, pulse (!) 49, temperature 98 F (36.7 C), resp. rate 16, height 5\' 5"  (1.651 m), weight 55.8 kg, SpO2 97 %.    Medical Problem List and Plan: 1. 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             -patient may  shower             -ELOS/Goals: expected dc 6/7 min A to mod A and Max a for auditory comprehension and Total A for verbal comprehension  -Continue CIR PT, OT,SLP 2.  Anemia: resume Heparin [place] TID. Transfused 1U on 5/15. Post-transfusion hemoglobin trending upward nicely, repeat Monday              -antiplatelet therapy: aspirin 81 mg daily 3. Pain Management: Tylenol prn 4. Mood: LCSW to evaluate and provide emotional support             -antipsychotic agents: n/a 5. Neuropsych: This patient is not capable of making decisions on her own behalf. 6. Skin/Wound Care: Routine skin care checks 7. Dysphagia             -- continue Dysphagia 1 diet with thin liquids.  Crushed meds with applesauce.  SLP evaluation.             -- Continue  nutritional supplements, B12, MVI  -5/22 Start normal saline, she is not drinking much fluid PO 8: Paroxysmal atrial fibrillation: continue  Lopressor 50 mg BID --home torsemide on hold --no AC due to small bowel AVMs Add magnesium gluconate 250mg  HS    01/14/2022    2:44 PM 01/14/2022    5:46 AM 01/13/2022    7:37 PM  Vitals with BMI  Systolic 208 022 336  Diastolic 76 66 93  Pulse 47 49 105    9: Diabetes mellitus: CBGs qAC,qHS; monitor intake and activity --continue SSI --increase glipizide back to 10mg  (home dose) Increase Semglee to 11U started HS D/c Ensure between meals 10: Hypertension: d/c Lisinopril due to worsening kidney function, continue amlodipine 10 mg, Lopressor. Repeat creatinine on Monday given increase in Lisinopril.  5/13 BP controlled today. 5/14 some variability in B/P, with mild elevation, some episodes of bradycardia (HR 46 at 051 today). -D/c lisinopril today since BUN 28->68 and Crt 2.28->3.20, with increase in past 2 days.  Hold lopressor with bradycardia episodes.  Will see how B/P trends on amlodipine 10 mg, may consider adding hydralazine if B/p is elevated.   -now hypotensive, change fluids to NS HS 12 hours -5/22 BP a little stable currently, will continue to monitor      01/14/2022    2:44 PM 01/14/2022    5:46 AM 01/13/2022    7:37 PM  Vitals with BMI  Systolic 122 449 753  Diastolic 76 66 93  Pulse 47 49 105   11: Hyperlipidemia: continue rosuvastatin 10 mg 12: Moderate malnutrition: continue supplements 13: Chronic kidney disease stage 3b: follow-up BMP     -5/13 B/P is in the 120's today. Pt had lisinopril increased to 40 mg yesterday for elevated B/P.  Since lisinopril may increase Crt, decrease lisinopril to 20 mg daily today and push po intake and repeat BMP. -5/14 Crt up to 3.2 and BUN up to 68.  Fluid bolus (1L NS) and continuous fluid (0.45 NS at 75 ml/hr) given for rise in BUN and Crt. -d/c lisinopril (per Dr. Christella Noa) since could  be a possible cause of elevated Crt . -5/22 Repeat labs tomorrow     Latest Ref Rng & Units 01/09/2022    1:25 PM 01/07/2022    7:48 AM 01/06/2022    5:53 AM  BMP  Glucose 70 - 99 mg/dL 183   185   191    BUN 8 - 23 mg/dL 47   60   68    Creatinine 0.44 - 1.00 mg/dL 2.69   2.78   3.20    Sodium 135 - 145 mmol/L 142   140   144    Potassium 3.5 - 5.1 mmol/L 4.5   4.7   4.7    Chloride 98 - 111 mmol/L 113   112   110    CO2 22 - 32 mmol/L 19   23   25     Calcium 8.9 - 10.3 mg/dL 8.6   7.5   8.1      14: Glaucoma: continue latanoprost drops 15: Hypothyroidism: continue Synthroid 16: Diarrhea/loose stools: monitor; getting loperamide 2 mg daily (BID prn) -5/13 Continue current regimen. 17: Bowel and bladder incontinence: start bowel and bladder program 18. Daytime somnolence: [employer] and SW if therapy can be scheduled a little later in the day and if daughters can be present to help motivate her 82. Bradycardia: discontinue lopressor.  20. History of GI bleed: start protonix.  21. Hypotension: resolved, hemoglobin reviewed and stable 22. Neurogenic bladder: bladder program ordered starting 5/11. 23. Stool incontinence: fiber added to bulk stool  24. Screening for vitamin D deficiency: check vitamin D level on Monday 25. Fatigue:   -5/22 Iron 68, HGB slightly improved to 9.9   LOS: 11 days A FACE TO [place] 01/14/2022, 7:34 AM

## 2022-01-14 NOTE — Progress Notes (Signed)
Occupational Therapy Session Note  Patient Details  Name: Patricia Dudley MRN: [id number] Date of Birth: Dec 14, 1929  Today's Date: 01/14/2022 OT Individual Time: 6073-7106 OT Individual Time Calculation (min): 72 min    Short Term Goals: Week 2:  OT Short Term Goal 1 (Week 2): Pt will thread LB clothing over both feet with no more than CGA for balance and multimodal cueing as needed OT Short Term Goal 2 (Week 2): Pt will tolerate 2+ mins in stance during ADL task with no more than min A for balance OT Short Term Goal 3 (Week 2): Pt will attend to ADL task with min A for at least 2 mins to promote functional participation and sustained attention  Skilled Therapeutic Interventions/Progress Updates:  Skilled OT intervention completed with focus on motor planning within functional context including self-feeding, oral care, toileting and functional transfers. Pt received seated in w/c with Patricia Dudley (daughter) feeding pt lunch. This therapist unsure if pt's daughter was cleared to assist pt with feeding, however Patricia Dudley able to teach back unprompted pt's puree consistency and feeding routine with prohibited switching between solids/liquids during meals. Notified pt's SLP from earlier session to inform of family assisting to confirm clearance.   Therapist taking over meal to promote independence with self-feeding. Utilizing magic cup as a motivational tasty treat (daughter reports she likes sweet foods) to promote initiation with use of spoon in R hand, pt was able to initiate bringing spoon to cup and scooping mechanism once in cup, despite requiring total A for precision with placement/movement. Total A needed for initial placement of spoon in R hand then pt able to maintain actual grasp with no assist. Therapist provided wrist and elbow stabilization on the dorsal aspects of the arm to promote cup to mouth feeding, with pt able to follow through the motor planning and movement about 85% of the time  after therapist initiated the pathway to pt's mouth.   Per SLP following meals, oral care via suction sponge/brush completed at total A for oral hygiene with no pocketing noted and max cues needed for pt to attend to opening mouth, sticking out tongue and smiling for teeth brushing.   Noted suspected BM in brief, with therapist transitioning to bathroom for toileting. With use of grab bars pt able to stand x3, however unable to stand completely or for long enough period of time to pivot/shift hips to commode. On several accounts, pt attempted to sit despite max A to remain standing when attempting transfers. 2/2 time constraint and no +2 assist for safety therapist transitioned to back in room for nursing to assist with bed level toileting. Attempted stand pivot with pt using RW, with pt able to come to standing with min A, however unable to shift weight or follow any commands despite max cues and multimodal cues/facilitation. Completed sit <> stand in [place] with max multimodal cues and min A, with use of BUE on grab bars. Dependent transfer to EOB with CGA for trunk balance. Min A standing for perched position. Max A needed for bed mobility, then NT at bedside at direct care handoff and at end of session.  Therapy Documentation Precautions:  Precautions Precautions: Fall Precaution Comments: aspiration precautions Restrictions Weight Bearing Restrictions: No  Pain: Non-verbal, however no signs of pain or distress via facial expressions   Therapy/Group: Individual Therapy  Patricia Dudley 01/14/2022, 7:50 AM

## 2022-01-14 NOTE — Progress Notes (Addendum)
Speech Language Pathology Daily Session Note  Patient Details  Name: Patricia Dudley MRN: [id number] Date of Birth: 1930-05-27  Today's Date: 01/14/2022 SLP Individual Time: 0935-1030 SLP Individual Time Calculation (min): 55 min  Short Term Goals: Week 2: SLP Short Term Goal 1 (Week 2): Pt will follow 3 commands within a functional context given Mod multimodal A. - Followed 3 commands within a functional context (washing hands and oral care) given Max to Total A.  SLP Short Term Goal 2 (Week 2): Pt will communicate wants, needs, ideas, and thoughts x 5 given Max  A for use of mulitmodal communication methods. - x 4 given Max A for use of multimodal communication methods (via yes/no questions and model prompts) and extensive wait time.  SLP Short Term Goal 3 (Week 2): Pt will consume current diet with minimal overt s/s of aspiration with Mod verbal cues for use of swallowing compensatory strategies. - Pt consumed Dysphagia 1 and NTL's with no overt s/sx c/f aspiration with Mod-Max verbal cues for swallow initiation this date. Bolus holding noted with resultant suctioning and oral care provided to clear oral residuals. With oral care, gag reflex elicited with subsequent emesis. Performed oral care and suctioning of oral care to clear emesis.  SLP Short Term Goal 4 (Week 2): Pt will maintain sustianed attention for 5 minutes with Mod A. - Pt maintained attention for up to 2 minutes with Max A.  Skilled Therapeutic Interventions: Pt seen this date for skilled ST intervention targeting dysphagia and communication goals outlined above. Pt received sleeping in TIS.  Aroused to name. Non-verbal with the exception of echolalia x 2 on request. Appeared agreeable to ST intervention.  SLP facilitated today's session by continuing to provide the following skilled interventions within the context of functional tx tasks: extra processing time, supported communication interventions (simple language, written  + visual choice prompts, yes/no + close-ended vs open-ended questions, repetition, and use of hand + head gestures), Max A for adherence to aspiration precautions, and Max multimodal A for functional communication. No clinical s/sx concerning for aspiration with recommended textures (Dysphagia 1 and Nectar-Thick liquids provided separately from one another), though bolus holding of puree was observed (oral suctioning provided by SLP. Pt self-fed two bites of puree bites given Total A/HOH assistance before declining via head shake. Continues to attend best when items are placed and people stand in her L field.    Pt continues to inconsistently answer yes/no questions via head gestures re: personal cares/needs despite being provided with Max multimodal A to include gaining her attention first, SLP repositioning to pt's left visual field, repeating requests, modeling commands, providing visual supports/aids, and allowing for extra processing time. No attempts to communicate verbally this date despite utilization of aforementioned interventions. Please see above for objective data re: pt's performance.  Pt left in TIS with all safety measures activated. Call bell reviewed and within reach, and all immediate needs met. Continue per current ST POC.  Pain Pt did not report pain; NAD appreciated   Therapy/Group: Individual Therapy  Doroteo Nickolson A Onedia Vargus 01/14/2022, 1:56 PM

## 2022-01-15 LAB — BASIC METABOLIC PANEL
BUN: 75 mg/dL — ABNORMAL HIGH (ref 8–23)
BUN: 82 mg/dL — ABNORMAL HIGH (ref 8–23)
CO2: 22 mmol/L (ref 22–32)
CO2: 20 mmol/L — ABNORMAL LOW (ref 22–32)
Calcium: 8.7 mg/dL — ABNORMAL LOW (ref 8.9–10.3)
Calcium: 8.4 mg/dL — ABNORMAL LOW (ref 8.9–10.3)
Chloride: >130 mmol/L (ref 98–111)
Chloride: >130 mmol/L (ref 98–111)
Creatinine, Ser: 3.52 mg/dL — ABNORMAL HIGH (ref 0.44–1.00)
Creatinine, Ser: 3.71 mg/dL — ABNORMAL HIGH (ref 0.44–1.00)
GFR, Estimated: 12 mL/min — ABNORMAL LOW (ref >60)
GFR, Estimated: 11 mL/min — ABNORMAL LOW (ref >60)
Glucose, Bld: 143 mg/dL — ABNORMAL HIGH (ref 70–99)
Glucose, Bld: 368 mg/dL — ABNORMAL HIGH (ref 70–99)
Potassium: 5.1 mmol/L (ref 3.5–5.1)
Potassium: 5 mmol/L (ref 3.5–5.1)
Sodium: 159 mmol/L — ABNORMAL HIGH (ref 135–145)
Sodium: 157 mmol/L — ABNORMAL HIGH (ref 135–145)

## 2022-01-15 LAB — GLUCOSE, CAPILLARY
Glucose-Capillary: 126 mg/dL — ABNORMAL HIGH (ref 70–99)
Glucose-Capillary: 248 mg/dL — ABNORMAL HIGH (ref 70–99)
Glucose-Capillary: 206 mg/dL — ABNORMAL HIGH (ref 70–99)
Glucose-Capillary: 247 mg/dL — ABNORMAL HIGH (ref 70–99)
Glucose-Capillary: 265 mg/dL — ABNORMAL HIGH (ref 70–99)

## 2022-01-15 MED ORDER — SODIUM CHLORIDE 0.45 % IV SOLN: INTRAVENOUS | Status: DC

## 2022-01-15 MED ORDER — DEXTROSE 5 % IV SOLN: INTRAVENOUS | Status: DC

## 2022-01-15 NOTE — Progress Notes (Signed)
Speech Language Pathology Daily Session Note  Patient Details  Name: Patricia Dudley MRN: [id number] Date of Birth: 07/30/1930  Today's Date: 01/15/2022 SLP Individual Time: 1430-1500 SLP Individual Time Calculation (min): 30 min  Short Term Goals: Week 2: SLP Short Term Goal 1 (Week 2): Pt will follow 3 commands within a functional context given Mod multimodal A. SLP Short Term Goal 2 (Week 2): Pt will communicate wants, needs, ideas, and thoughts x 5 given Max  A for use of mulitmodal communication methods. SLP Short Term Goal 3 (Week 2): Pt will consume current diet with minimal overt s/s of aspiration with Mod verbal cues for use of swallowing compensatory strategies. SLP Short Term Goal 4 (Week 2): Pt will maintain sustianed attention for 5 minutes with Mod A.  Skilled Therapeutic Interventions: Skilled treatment session focused on family education with the patient and multiple family members. SLP facilitated session by providing general education regarding patient's current cognitive-linguistic function, barriers to progress, patient's current swallow function, diet recommendations, compensatory strategies and how to administer proper and safe oral care. SLP provided demonstration via the suction toothbrush. All verbalized understanding but will need reinforcement of information as patient's discharge date approaches. Patient handed off to OT. Continue with current plan of care.      Pain No/Denies Pain   Therapy/Group: Individual Therapy  Donesha Wallander 01/15/2022, 3:09 PM

## 2022-01-15 NOTE — Progress Notes (Signed)
Initial Nutrition Assessment  DOCUMENTATION CODES:   Non-severe (moderate) malnutrition in context of chronic illness  INTERVENTION:   Continue 48 - Calorie Count Continue Multivitamin w/ minerals daily Vital Cuisine Shake BID, each supplement provides 520 kcal and 22 grams of protein  NUTRITION DIAGNOSIS:   Moderate Malnutrition related to chronic illness as evidenced by moderate fat depletion, percent weight loss, mild muscle depletion.  GOAL:   Patient will meet greater than or equal to 90% of their needs  MONITOR:   PO intake, Supplement acceptance, Diet advancement, Labs, Weight trends  REASON FOR ASSESSMENT:   Consult Calorie Count  ASSESSMENT:   88 y.o. female admitted to CIR after an embolic stroke. PMH includes GERD, T2DM, TIA, CVA, HTN, and gastritis.   Pt sitting up in wheelchair, finishing up with SLP at time of RD visit. Pt daughters present in room as well.  Family all endorses good PO intake. Spoke with NT outside of room and reports that pt has been eating well today and provided calorie count information to RD. Per EMR, pt PO intake includes, 5/20: Lunch 75%, Dinner 50% 5/21: Breakfast 0%, Lunch 80% 5/22: Breakfast 80%, Lunch 35%, Dinner 0% 5/23: Breakfast 75%  Per EMR, pt has had an 11% weight loss within 5 months, this is clinically significant for time frame.   RD to order supplements for pt to provide additional calories.   Family asking about pt fluid intake, discussed that we can send additional liquids up on pt tray to have in room and SLP confirmed that there are some on the unit as well.   Day 1 Calorie Count Results 5/22 Dinner: 199 kcal, 22 gm PRO 5/23 Breakfast: 346 kcal, 27 gm PRO 5/23 Lunch: 343 kcal, 15 gm PRO  Total intake: 888 kcal (55.5% of minimum estimated needs)  64 protein (85% of minimum estimated needs)  RD to follow-up with calories count results tomorrow.   Medications reviewed and include: Glucotrol, SSI 0-5 units  daily + 0-9 units TID, Semglee, Magnesium Gluconate, MVI, Protonix, Fibercon, Vitamin B12 Labs reviewed: Sodium 159, BUN 75, Creatinine 3.52, 24 hr CBG 107-267  NUTRITION - FOCUSED PHYSICAL EXAM:  Flowsheet Row Most Recent Value  Orbital Region Moderate depletion  Upper Arm Region Moderate depletion  Thoracic and Lumbar Region Moderate depletion  Buccal Region Moderate depletion  Temple Region Mild depletion  Clavicle Bone Region Mild depletion  Clavicle and Acromion Bone Region Mild depletion  Scapular Bone Region Mild depletion  Dorsal Hand Mild depletion  Patellar Region Severe depletion  Anterior Thigh Region Severe depletion  Posterior Calf Region Severe depletion  Edema (RD Assessment) None  Hair Reviewed  Eyes Reviewed  Mouth Reviewed  Skin Reviewed  Nails Reviewed   Diet Order:   Diet Order             DIET - DYS 1 Room service appropriate? Yes; Fluid consistency: Nectar Thick  Diet effective now                   EDUCATION NEEDS:   Not appropriate for education at this time  Skin:  Skin Assessment: Reviewed RN Assessment  Last BM:  5/22  Height:   Ht Readings from Last 1 Encounters:  01/03/22 5\' 5"  (1.651 m)    Weight:   Wt Readings from Last 1 Encounters:  01/03/22 55.8 kg    Ideal Body Weight:  56.8 kg  BMI:  Body mass index is 20.47 kg/m.  Estimated Nutritional Needs:  Kcal:  1600-1800  Protein:  80-95 grams  Fluid:  >/= 1.6 L    Patricia Dudley RD, LDN Clinical Dietitian See [facility] for contact information.

## 2022-01-15 NOTE — Significant Event (Signed)
Date and time results received: 01/15/22 0630   Test: Chloride  Critical Value: >130  Name of Provider Notified: Linna Hoff, [place]  Orders Received? Or Actions Taken?: No New orders at this time

## 2022-01-15 NOTE — Consult Note (Signed)
Reason for Consult:  Hypernatremia Referring Physician:  Dr. Ranell Patrick  Chief Complaint: Aphasia  Assessment/Plan: Hypernatremia - considered chronic at this point and will not want to correct to quickly given risk of cerebral edema. Hopefully we can correct in the next 48-72 hrs. Patricia Dudley free water deficit is 3.8L and goal is to correct to ~151-154 / 24hrs. Will be difficult to calculate free water clearance if she's in a diaper; not convinced she needs a foley just to calculate a free water clearance. - Will start D5W at 70 ml/hr and follow serial chemistry panels. - Major question is what to do once we correct as Patricia Dudley free water access is very limited; would need either free water via IV or a G-tube for free water.  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 Afib on lopressor for rate control, unable to anticoagulate because of symptomatic AVM's in the past. Anemia - transfuse as needed. Will check  S/p CVA HTN  DM Hypothyroidism on Synthroid.   HPI: Patricia Dudley is an 88 y.o. female PAF, AV malformation of the duodenum, hypothyroidism, DM, HTN, HLD, CKD 3b presenting with aphasia, left gaze and slurred speech but outside the window for lytics. CT / MRI showed left basal ganglia/ internal capsule infarct. Also treated for aspiration PNA with Rocephin and Azithromycin -> Augmentin. Patient on a Dysphagia 1 diet initially and then eventually now to a pureed diet and she also requires assistance to eat.   ROS Pertinent items are noted in HPI.  Chemistry and CBC: Creatinine, Ser  Date/Time Value Ref Range Status  01/15/2022 05:39 AM 3.52 (H) 0.44 - 1.00 mg/dL Final  01/09/2022 01:25 PM 2.69 (H) 0.44 - 1.00 mg/dL Final  01/07/2022 07:48 AM 2.78 (H) 0.44 - 1.00 mg/dL Final  01/06/2022 05:53 AM 3.20 (H) 0.44 - 1.00 mg/dL Final  01/04/2022 05:58 AM 2.28 (H) 0.44 - 1.00 mg/dL Final  12/29/2021 01:00 AM 1.76 (H) 0.44 - 1.00 mg/dL Final  12/27/2021 02:38 AM 1.92 (H)  0.44 - 1.00 mg/dL Final  12/26/2021 01:06 AM 1.86 (H) 0.44 - 1.00 mg/dL Final  12/25/2021 05:30 AM 2.05 (H) 0.44 - 1.00 mg/dL Final  12/24/2021 02:38 PM 2.40 (H) 0.44 - 1.00 mg/dL Final  12/24/2021 02:36 PM 2.33 (H) 0.44 - 1.00 mg/dL Final  08/16/2021 12:39 PM 2.18 (H) 0.44 - 1.00 mg/dL Final  06/08/2021 04:05 PM 2.07 (H) 0.44 - 1.00 mg/dL Final  05/05/2020 01:53 PM 1.68 (H) 0.44 - 1.00 mg/dL Final  05/16/2017 06:40 AM 1.52 (H) 0.44 - 1.00 mg/dL Final  05/15/2017 04:11 AM 1.65 (H) 0.44 - 1.00 mg/dL Final  05/14/2017 03:37 PM 1.89 (H) 0.44 - 1.00 mg/dL Final  02/28/2017 06:56 AM 1.57 (H) 0.44 - 1.00 mg/dL Final  02/27/2017 12:05 PM 2.06 (H) 0.44 - 1.00 mg/dL Final  12/19/2016 10:40 PM 1.78 (H) 0.44 - 1.00 mg/dL Final  12/19/2016 04:09 AM 1.48 (H) 0.44 - 1.00 mg/dL Final  12/18/2016 01:47 PM 1.74 (H) 0.44 - 1.00 mg/dL Final  08/08/2016 05:19 AM 1.59 (H) 0.44 - 1.00 mg/dL Final  08/07/2016 01:17 PM 1.87 (H) 0.44 - 1.00 mg/dL Final  07/27/2015 04:16 AM 1.41 (H) 0.44 - 1.00 mg/dL Final  07/26/2015 10:47 AM 1.47 (H) 0.44 - 1.00 mg/dL Final   Recent Labs  Lab 01/09/22 1325 01/15/22 0539  NA 142 159*  K 4.5 5.1  CL 113* >130*  CO2 19* 22  GLUCOSE 183* 143*  BUN 47* 75*  CREATININE 2.69* 3.52*  CALCIUM 8.6* 8.7*   Recent Labs  Lab 01/09/22 1325 01/14/22 0618  WBC 12.4* 8.5  NEUTROABS 7.5  --   HGB 9.7* 9.9*  HCT 30.7* 31.2*  MCV 88.0 88.4  PLT 400 521*   Liver Function Tests: No results for input(s): AST, ALT, ALKPHOS, BILITOT, PROT, ALBUMIN in the last 168 hours. No results for input(s): LIPASE, AMYLASE in the last 168 hours. No results for input(s): AMMONIA in the last 168 hours. Cardiac Enzymes: No results for input(s): CKTOTAL, CKMB, CKMBINDEX, TROPONINI in the last 168 hours. Iron Studies:  Recent Labs    01/14/22 0618  IRON 68  TIBC 349   PT/INR: @LABRCNTIP (inr:5)  Xrays/Other Studies: ) Results for orders placed or performed during the hospital encounter  of 01/03/22 (from the past 48 hour(s))  Glucose, capillary     Status: Abnormal   Collection Time: 01/13/22  5:12 PM  Result Value Ref Range   Glucose-Capillary 220 (H) 70 - 99 mg/dL    Comment: Glucose reference range applies only to samples taken after fasting for at least 8 hours.  Glucose, capillary     Status: Abnormal   Collection Time: 01/13/22  9:06 PM  Result Value Ref Range   Glucose-Capillary 151 (H) 70 - 99 mg/dL    Comment: Glucose reference range applies only to samples taken after fasting for at least 8 hours.  CBC     Status: Abnormal   Collection Time: 01/14/22  6:18 AM  Result Value Ref Range   WBC 8.5 4.0 - 10.5 K/uL   RBC 3.53 (L) 3.87 - 5.11 MIL/uL   Hemoglobin 9.9 (L) 12.0 - 15.0 g/dL   HCT 31.2 (L) 36.0 - 46.0 %   MCV 88.4 80.0 - 100.0 fL   MCH 28.0 26.0 - 34.0 pg   MCHC 31.7 30.0 - 36.0 g/dL   RDW 16.3 (H) 11.5 - 15.5 %   Platelets 521 (H) 150 - 400 K/uL   nRBC 0.0 0.0 - 0.2 %    Comment: Performed at [facility] [address], [place], [place] [address]  VITAMIN D 25 Hydroxy (Vit-D Deficiency, Fractures)     Status: None   Collection Time: 01/14/22  6:18 AM  Result Value Ref Range   Vit D, 25-Hydroxy 50.78 30 - 100 ng/mL    Comment: (NOTE) Vitamin D deficiency has been defined by the Institute of Medicine  and an Endocrine Society practice guideline as a level of serum 25-OH  vitamin D less than 20 ng/mL (1,2). The Endocrine Society went on to  further define vitamin D insufficiency as a level between 21 and 29  ng/mL (2).  1. IOM (Institute of Medicine). 2010. Dietary reference intakes for  calcium and D. [place]: The [employer]. 2. Holick MF, Binkley Helper, Bischoff-Ferrari HA, et al. Evaluation,  treatment, and prevention of vitamin D deficiency: an Endocrine  Society clinical practice guideline, JCEM. 2011 Jul; 96(7): 1911-30.  Performed at [facility], [place] [address] [place], [place] [address]   Iron and TIBC      Status: None   Collection Time: 01/14/22  6:18 AM  Result Value Ref Range   Iron 68 28 - 170 ug/dL   TIBC 349 250 - 450 ug/dL   Saturation Ratios 20 10.4 - 31.8 %   UIBC 281 ug/dL    Comment: Performed at [facility], [place] [address] [place], [place] [address]  Glucose, capillary  Status: Abnormal   Collection Time: 01/14/22  6:20 AM  Result Value Ref Range   Glucose-Capillary 107 (H) 70 - 99 mg/dL    Comment: Glucose reference range applies only to samples taken after fasting for at least 8 hours.  Glucose, capillary     Status: Abnormal   Collection Time: 01/14/22 11:30 AM  Result Value Ref Range   Glucose-Capillary 226 (H) 70 - 99 mg/dL    Comment: Glucose reference range applies only to samples taken after fasting for at least 8 hours.  Glucose, capillary     Status: Abnormal   Collection Time: 01/14/22  4:11 PM  Result Value Ref Range   Glucose-Capillary 267 (H) 70 - 99 mg/dL    Comment: Glucose reference range applies only to samples taken after fasting for at least 8 hours.  Glucose, capillary     Status: Abnormal   Collection Time: 01/14/22  9:03 PM  Result Value Ref Range   Glucose-Capillary 140 (H) 70 - 99 mg/dL    Comment: Glucose reference range applies only to samples taken after fasting for at least 8 hours.  Basic metabolic panel     Status: Abnormal   Collection Time: 01/15/22  5:39 AM  Result Value Ref Range   Sodium 159 (H) 135 - 145 mmol/L   Potassium 5.1 3.5 - 5.1 mmol/L   Chloride >130 (HH) 98 - 111 mmol/L    Comment: CRITICAL RESULT CALLED TO, READ BACK BY AND VERIFIED WITH: YOUNG T,RN 01/15/22 0626 WAYK    CO2 22 22 - 32 mmol/L   Glucose, Bld 143 (H) 70 - 99 mg/dL    Comment: Glucose reference range applies only to samples taken after fasting for at least 8 hours.   BUN 75 (H) 8 - 23 mg/dL   Creatinine, Ser 3.52 (H) 0.44 - 1.00 mg/dL   Calcium 8.7 (L) 8.9 - 10.3 mg/dL   GFR, Estimated 12 (L) >60 mL/min    Comment: (NOTE) Calculated  using the CKD-EPI Creatinine Equation (2021)    Anion gap NOT CALCULATED 5 - 15    Comment: Performed at [facility] [address], [place], [place] [address]  Glucose, capillary     Status: Abnormal   Collection Time: 01/15/22  5:49 AM  Result Value Ref Range   Glucose-Capillary 126 (H) 70 - 99 mg/dL    Comment: Glucose reference range applies only to samples taken after fasting for at least 8 hours.  Glucose, capillary     Status: Abnormal   Collection Time: 01/15/22  9:38 AM  Result Value Ref Range   Glucose-Capillary 248 (H) 70 - 99 mg/dL    Comment: Glucose reference range applies only to samples taken after fasting for at least 8 hours.  Glucose, capillary     Status: Abnormal   Collection Time: 01/15/22 11:30 AM  Result Value Ref Range   Glucose-Capillary 206 (H) 70 - 99 mg/dL    Comment: Glucose reference range applies only to samples taken after fasting for at least 8 hours.   No results found.  PMH:   Past Medical History:  Diagnosis Date   A-fib [facility])    CVA (cerebral vascular accident) ([place])    Dementia ([place])    Diabetes mellitus without complication ([place])    GI bleed    Hyperlipemia    Hypertension    Thyroid disease     PSH:   Past Surgical History:  Procedure Laterality Date   COLONOSCOPY N/A 05/16/2017  Procedure: COLONOSCOPY;  Surgeon:  Landsman, MD;  Location: [facility] ENDOSCOPY;  Service: Gastroenterology;  Laterality: N/A;   ESOPHAGOGASTRODUODENOSCOPY N/A 08/08/2016   Procedure: ESOPHAGOGASTRODUODENOSCOPY (EGD);  Surgeon: [name], MD;  Location: [facility] ENDOSCOPY;  Service: Endoscopy;  Laterality: N/A;   ESOPHAGOGASTRODUODENOSCOPY N/A 05/16/2017   Procedure: ESOPHAGOGASTRODUODENOSCOPY (EGD);  Surgeon:  Landsman, MD;  [facility];  Service: Gastroenterology;  Laterality: N/A;   ESOPHAGOGASTRODUODENOSCOPY (EGD) WITH PROPOFOL N/A 02/28/2017   Procedure: ESOPHAGOGASTRODUODENOSCOPY (EGD) WITH PROPOFOL;  Surgeon:  [name], MD;  Location: [facility] ENDOSCOPY;  Service: Endoscopy;  Laterality: N/A;   EYE SURGERY     left    Allergies:  Allergies  Allergen Reactions   Ezetimibe Other (See Comments)    Unspecified Other reaction(s): Other (see comments) Other reaction(s): Other (See Comments) Unspecified Unspecified   Iodine Rash   Rosuvastatin Rash    Joint pain    Medications:   Prior to Admission medications   Medication Sig Start Date End Date Taking? Authorizing Provider  alendronate (FOSAMAX) 70 MG tablet Take 70 mg by mouth once a week. Take with a full glass of water on an empty stomach.    [provider]  amLODipine (NORVASC) 5 MG tablet Take 2 tablets (10 mg total) by mouth daily. 01/03/22   Ghimire, Henreitta Leber, MD  aspirin EC 81 MG EC tablet Take 1 tablet (81 mg total) by mouth daily. Swallow whole. 01/03/22   Ghimire, Henreitta Leber, MD  cholecalciferol (VITAMIN D3) 25 MCG (1000 UNIT) tablet Take 1,000 Units by mouth daily.    [provider]  feeding supplement (ENSURE ENLIVE / ENSURE PLUS) LIQD Take 237 mLs by mouth 2 (two) times daily between meals. 01/03/22   Ghimire, Henreitta Leber, MD  fluticasone (FLONASE) 50 MCG/ACT nasal spray Place into the nose. 12/26/16 12/26/17  [provider]  glucose blood test strip USE TO TEST BLOOD SUGAR TWICE DAILY 12/12/16   [provider]  insulin aspart (NOVOLOG) 100 UNIT/ML injection 0-9 Units, Subcutaneous, 3 times daily with meals CBG < 70: Implement Hypoglycemia measures CBG 70 - 120: 0 units CBG 121 - 150: 1 unit CBG 151 - 200: 2 units CBG 201 - 250: 3 units CBG 251 - 300: 5 units CBG 301 - 350: 7 units CBG 351 - 400: 9 units CBG > 400: call MD 01/03/22   Jonetta Osgood, MD  iron polysaccharides (NIFEREX) 150 MG capsule Take 1 capsule (150 mg total) by mouth 2 (two) times daily. Patient not taking: Reported on 12/24/2021 05/16/17   [name], MD  latanoprost (XALATAN) 0.005 % ophthalmic solution Place 1 drop into both  eyes at bedtime.    [provider]  levothyroxine (SYNTHROID, LEVOTHROID) 112 MCG tablet Take 112 mcg by mouth as directed. Take 1 tablet Mon-Fri    [provider]  lisinopril (PRINIVIL,ZESTRIL) 40 MG tablet Take 1 tablet (40 mg total) by mouth daily. 07/27/15   [name], MD  metoprolol tartrate (LOPRESSOR) 50 MG tablet Take 1 tablet (50 mg total) by mouth 2 (two) times daily. 01/03/22   Ghimire, Henreitta Leber, MD  ONETOUCH DELICA LANCETS 06Y MISC USE TO TEST BLOOD SUGAR TWICE DAILY 12/12/16   [provider]  pantoprazole (PROTONIX) 40 MG tablet Take 1 tablet (40 mg total) by mouth 2 (two) times daily before a meal. Patient not taking: Reported on 06/13/2017 12/20/16   Hillary Bow, MD  rosuvastatin (CRESTOR) 10 MG tablet Take 1 tablet (10 mg total) by  mouth daily. 01/04/22   Ghimire, Henreitta Leber, MD  vitamin B-12 (CYANOCOBALAMIN) 100 MCG tablet Take 100 mcg by mouth daily.    [provider]    Discontinued Meds:   Medications Discontinued During This Encounter  Medication Reason   heparin injection 5,000 Units    metoprolol tartrate (LOPRESSOR) tablet 50 mg    lisinopril (ZESTRIL) tablet 20 mg    lisinopril (ZESTRIL) tablet 40 mg    lisinopril (ZESTRIL) tablet 20 mg    metoprolol tartrate (LOPRESSOR) tablet 25 mg    carvedilol (COREG) tablet 6.25 mg    carvedilol (COREG) tablet 3.125 mg    insulin aspart (novoLOG) injection 0-9 Units    insulin aspart (novoLOG) injection 0-9 Units    rosuvastatin (CRESTOR) tablet 10 mg    feeding supplement (ENSURE ENLIVE / ENSURE PLUS) liquid 237 mL    insulin aspart (novoLOG) injection 0-9 Units    0.45 % sodium chloride infusion    glipiZIDE (GLUCOTROL) tablet 2.5 mg    glipiZIDE (GLUCOTROL) tablet 5 mg    empagliflozin (JARDIANCE) tablet 10 mg Discontinued by provider   insulin glargine-yfgn (SEMGLEE) injection 10 Units    dextrose 5 %-0.45 % sodium chloride infusion     Social History:  reports that she has  never smoked. She has never used smokeless tobacco. She reports that she does not drink alcohol and does not use drugs.  Family History:   Family History  Problem Relation Age of Onset   Diabetes Mother    CVA Mother    Hypertension Mother    Diabetes Sister    Diabetes Brother     Blood pressure 118/72, pulse 90, temperature 98 F (36.7 C), resp. rate 14, height 5\' 5"  (1.651 m), weight 55.8 kg, SpO2 98 %. General appearance: distracted Eyes: negative Neck: no adenopathy, no carotid bruit, no JVD, supple, symmetrical, trachea midline, and thyroid not enlarged, symmetric, no tenderness/mass/nodules Back: symmetric, no curvature. ROM normal. No CVA tenderness. Resp: clear to auscultation bilaterally and but poor effort Cardio: irregularly irregular rhythm GI: soft, non-tender; bowel sounds normal; no masses,  no organomegaly Extremities: extremities normal, atraumatic, no cyanosis or edema Pulses: 2+ and symmetric Skin: poor skin turgor       Rusti Arizmendi, Hunt Oris, MD 01/15/2022, 2:24 PM

## 2022-01-15 NOTE — Progress Notes (Signed)
PROGRESS NOTE   Subjective/Complaints: Sleepy this morning Cr elevating and Cl <130, nephrology notified.  Refused morning medications  ROS: Limited by language, unable to answer commands.  Objective:   No results found. Recent Labs    01/14/22 0618  WBC 8.5  HGB 9.9*  HCT 31.2*  PLT 521*    Recent Labs    01/15/22 0539  NA 159*  K 5.1  CL >130*  CO2 22  GLUCOSE 143*  BUN 75*  CREATININE 3.52*  CALCIUM 8.7*     Intake/Output Summary (Last 24 hours) at 01/15/2022 1041 Last data filed at 01/15/2022 0700 Gross per 24 hour  Intake 387.79 ml  Output --  Net 387.79 ml        Physical Exam: Vital Signs Blood pressure 118/72, pulse 90, temperature 98 F (36.7 C), resp. rate 14, height 5\' 5"  (1.651 m), weight 55.8 kg, SpO2 98 %.  Gen: no distress, normal appearing, BMI 20.47, fatigued HENT: oral mucosa pink and moist, NCAT Eyes:     General:    In bed, covers mouth with blanket    Right eye: No discharge.        Left eye: No discharge.     Comments: Left lid closed R eye lid more open  Mouth: denitrues in place, Mucus membranes a little dry Cardiovascular:     Rate and Rhythm: irregular, a fib, had episodes of bradycardia    Heart sounds: Normal heart sounds. No murmur heard.   No gallop.  Pulmonary:     Effort: Pulmonary effort is normal. No respiratory distress.     Breath sounds: Normal breath sounds. No wheezing or rales.  Abdominal:     General: Bowel sounds are normal. There is no distension.     Palpations: Abdomen is soft.     Tenderness: There is no abdominal tenderness.  Musculoskeletal:     Cervical back: Neck supple. Protracted posture    Comments: Limited participation in movement today, squeeze hand: L grip/biceps 5-/5 BUE: 3/5 grip/biceps.  Would not participate in RLE and LLE movement. Skin:    General: Skin is warm and dry.  Neurological:     Mental Status: She is alert.      Comments: Squeezes hands on command, did protrude tongue on command today, made attempt to smile when asked. No verbal expression shaking head yes or no-  Was able to remain awake and alert during exam today Ambulating ModA with RW. +echolalia Psychiatric:     Comments: Smiling,  +echolalia GU: continent of bladder at times, incontinent of bowel  Assessment/Plan: 1. Functional deficits which require 3+ hours per day of interdisciplinary therapy in a comprehensive inpatient rehab setting. Physiatrist is providing close team supervision and 24 hour management of active medical problems listed below. Physiatrist and rehab team continue to assess barriers to discharge/monitor patient progress toward functional and medical goals  Care Tool:  Bathing    Body parts bathed by patient: Left lower leg, Right lower leg, Left upper leg, Right upper leg, Front perineal area   Body parts bathed by helper: Right arm, Left arm, Chest, Abdomen, Buttocks, Face Body parts n/a: Front perineal area, Buttocks  Bathing assist Assist Level: Total Assistance - Patient < 25%     Upper Body Dressing/Undressing Upper body dressing   What is the patient wearing?: Pull over shirt    Upper body assist Assist Level: Total Assistance - Patient < 25%    Lower Body Dressing/Undressing Lower body dressing      What is the patient wearing?: Incontinence brief, Pants     Lower body assist Assist for lower body dressing: Maximal Assistance - Patient 25 - 49%     Toileting Toileting    Toileting assist Assist for toileting: Total Assistance - Patient < 25%     Transfers Chair/bed transfer  Transfers assist     Chair/bed transfer assist level: Dependent - mechanical lift     Locomotion Ambulation   Ambulation assist   Ambulation activity did not occur: Safety/medical concerns  Assist level: Moderate Assistance - Patient 50 - 74% Assistive device: Walker-rolling Max distance: 12   Walk 10  feet activity   Assist  Walk 10 feet activity did not occur: Safety/medical concerns  Assist level: Moderate Assistance - Patient - 50 - 74% Assistive device: Walker-rolling   Walk 50 feet activity   Assist Walk 50 feet with 2 turns activity did not occur: Safety/medical concerns         Walk 150 feet activity   Assist Walk 150 feet activity did not occur: Safety/medical concerns         Walk 10 feet on uneven surface  activity   Assist Walk 10 feet on uneven surfaces activity did not occur: Safety/medical concerns         Wheelchair     Assist Is the patient using a wheelchair?: No   Wheelchair activity did not occur: Safety/medical concerns         Wheelchair 50 feet with 2 turns activity    Assist    Wheelchair 50 feet with 2 turns activity did not occur: Safety/medical concerns       Wheelchair 150 feet activity     Assist  Wheelchair 150 feet activity did not occur: Safety/medical concerns       Blood pressure 118/72, pulse 90, temperature 98 F (36.7 C), resp. rate 14, height 5\' 5"  (1.651 m), weight 55.8 kg, SpO2 98 %.    Medical Problem List and Plan: 1. 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             -patient may  shower             -ELOS/Goals: expected dc 6/7 min A to mod A and Max a for auditory comprehension and Total A for verbal comprehension  -Continue CIR PT, OT,SLP 2.  Anemia: resume Heparin [place] TID. Transfused 1U on 5/15. Post-transfusion hemoglobin trending upward nicely, repeat Monday              -antiplatelet therapy: aspirin 81 mg daily 3. Pain Management: Tylenol prn 4. Mood: LCSW to evaluate and provide emotional support             -antipsychotic agents: n/a 5. Neuropsych: This patient is not capable of making decisions on her own behalf. 6. Skin/Wound Care: Routine skin care checks 7. Dysphagia             -- continue Dysphagia 1 diet  with thin liquids.  Crushed meds with applesauce.  SLP evaluation.             -- Continue nutritional  supplements, B12, MVI  -5/22 Start normal saline, she is not drinking much fluid PO 8: Paroxysmal atrial fibrillation: continue Lopressor 50 mg BID --home torsemide on hold --no AC due to small bowel AVMs Add magnesium gluconate 250mg  HS    01/15/2022    9:20 AM 01/14/2022    8:39 PM 01/14/2022    2:44 PM  Vitals with BMI  Systolic 132 440 102  Diastolic 72 64 76  Pulse  90 47    9: Diabetes mellitus: CBGs qAC,qHS; monitor intake and activity --continue SSI --increase glipizide back to 10mg  (home dose) Increase Semglee to 11U started HS D/c Ensure between meals 10: Hypertension: d/c Lisinopril due to worsening kidney function, continue amlodipine 10 mg, Lopressor. Repeat creatinine on Monday given increase in Lisinopril.  5/13 BP controlled today. 5/14 some variability in B/P, with mild elevation, some episodes of bradycardia (HR 46 at 051 today). -D/c lisinopril today since BUN 28->68 and Crt 2.28->3.20, with increase in past 2 days.  Hold lopressor with bradycardia episodes.  Will see how B/P trends on amlodipine 10 mg, may consider adding hydralazine if B/p is elevated.   -now hypotensive, change fluids to NS HS 12 hours -5/22 BP a little stable currently, will continue to monitor      01/15/2022    9:20 AM 01/14/2022    8:39 PM 01/14/2022    2:44 PM  Vitals with BMI  Systolic 725 366 440  Diastolic 72 64 76  Pulse  90 47   11: Hyperlipidemia: continue rosuvastatin 10 mg 12: Moderate malnutrition: continue supplements 13: Chronic kidney disease stage 3b: follow-up BMP     -5/13 B/P is in the 120's today. Pt had lisinopril increased to 40 mg yesterday for elevated B/P.  Since lisinopril may increase Crt, decrease lisinopril to 20 mg daily today and push po intake and repeat BMP. -5/14 Crt up to 3.2 and BUN up to 68.  Fluid bolus (1L NS) and continuous fluid (0.45 NS at 75  ml/hr) given for rise in BUN and Crt. -d/c lisinopril (per Dr. Christella Noa) since could be a possible cause of elevated Crt . Cr elevated, nephrology consulted     Latest Ref Rng & Units 01/15/2022    5:39 AM 01/09/2022    1:25 PM 01/07/2022    7:48 AM  BMP  Glucose 70 - 99 mg/dL 143   183   185    BUN 8 - 23 mg/dL 75   47   60    Creatinine 0.44 - 1.00 mg/dL 3.52   2.69   2.78    Sodium 135 - 145 mmol/L 159   142   140    Potassium 3.5 - 5.1 mmol/L 5.1   4.5   4.7    Chloride 98 - 111 mmol/L >130   113   112    CO2 22 - 32 mmol/L 22   19   23     Calcium 8.9 - 10.3 mg/dL 8.7   8.6   7.5      14: Glaucoma: continue latanoprost drops 15: Hypothyroidism: continue Synthroid 16: Diarrhea/loose stools: monitor; getting loperamide 2 mg daily (BID prn) -5/13 Continue current regimen. 17: Bowel and bladder incontinence: start bowel and bladder program 18. Daytime somnolence: [employer] and SW if therapy can be scheduled a little later in the day and if daughters can be present to help motivate her 30. Bradycardia: discontinue lopressor.  20. History of GI bleed: start protonix.  21. Hypotension: resolved,  hemoglobin reviewed and stable 22. Neurogenic bladder: bladder program ordered starting 5/11. 23. Stool incontinence: fiber added to bulk stool 24. Screening for vitamin D deficiency: check vitamin D level on Monday 25. Fatigue:   -5/22 Iron 68, HGB slightly improved to 9.9 26. Elevated chloride: notified nephrology 27. Hypernatremia Na 159: Nephrology consulted.    LOS: 12 days A FACE TO FACE EVALUATION WAS PERFORMED  Patricia Dudley 01/15/2022, 10:42 AM

## 2022-01-15 NOTE — Progress Notes (Signed)
Physical Therapy Session Note  Patient Details  Name: Patricia Dudley MRN: [id number] Date of Birth: [birth year]  Today's Date: 01/15/2022 PT Individual Time: 1638-4665 and 9935-7017 PT Individual Time Calculation (min): 27 min and 55 min  Short Term Goals: Week 1:  PT Short Term Goal 1 (Week 1): Pt will roll side to side w/ max A PT Short Term Goal 2 (Week 1): Pt will transfer sup to sit w/ max A PT Short Term Goal 3 (Week 1): Pt will transfer sit to stand w/ max A PT Short Term Goal 4 (Week 1): Pt will amb w/ LRAD x 10'. Week 2:  PT Short Term Goal 1 (Week 2): pt will transfer bed<>chair with LRAD and mod A of 1 consistantly PT Short Term Goal 2 (Week 2): pt will transfer sit<>stand with LRAD and mod A of 1 consistantly PT Short Term Goal 3 (Week 2): pt will ambulate 79ft with LRAD and mod A of 1  Skilled Therapeutic Interventions/Progress Updates:   Treatment Session 1 Received pt asleep in TIS WC and required max cues for arousal. Found pocketing food in mouth and with food adhered to tongue. Pt required increased time to suction and perform oral care (total A) with max cues to open mouth and stick tongue out (pt with sensitive gag reflex) - informed NT that oral care needs to be completed after every meal. Pt unable to communicate if she was wet or needed to use bathroom. Pt stood with RW and min A of 2 (pulling up on RW) and NT checked to ensure pt was dry while PT provided assist for standing balance - pt attempting to sit multiple times requiring heavy mod A and max cues to remain standing - pt found to be dry. Scooted back in TIS WC with max A as pt not initiating scooting and concluded session with pt sitting in TIS WC, needs within reach, and seatbelt alarm on.   Treatment Session 2 Received pt sitting in TIS WC with NT assisting with feeding. Pt did not appear to be in any pain. Pt's daughters present for family education training. Session with emphasis on D/C planning, functional  mobility/transfers, dynamic standing balance/coordination, and gait training. Performed oral care with total A, however pt unable to motor plan/sequence opening mouth; therefore brushed teeth and cleaned around cheeks - family observed. Nephrology MD present for brief assessment. Worked on initiation/attention/motor planning and sequencing performing sit<>stands with RW and light mod A (of note, pt responds better to cues provided by daughters than cues from therapist). Upon standing, pt proceeded to take a few steps without encouragement (ambulated ~56ft) with RW and min/light mod A with daughter standing in front as motivation. Pt did required assist to advance RLE with increased fatigue. Discussed getting hospital bed to decrease caregiver burden and family reported they may have access to one and will check to find out. Also educated family on need for custom WC upon D/C, however family reporting that they have a transport chair and declining custom chair due to weight and difficulty fitting through doorways. Provided daughters with home measurement sheet to get further specifications and dicussed scheduling additional family education sessions prior to discharge. Concluded session with pt sitting in TIS WC, needs within reach, and seatbelt alarm on awaiting upcoming SLP session.   Therapy Documentation Precautions:  Precautions Precautions: Fall Precaution Comments: aspiration precautions Restrictions Weight Bearing Restrictions: No  Therapy/Group: Individual Therapy Alfonse Alpers PT, DPT  01/15/2022, 7:06 AM

## 2022-01-15 NOTE — Progress Notes (Signed)
This nurse attempted to administer morning medications x3 (glipizide and synthroid). Pt was alert but  refusing to eat the apple sauce with crushed medications. Provider will be notified.

## 2022-01-15 NOTE — Progress Notes (Signed)
Occupational Therapy Session Note  Patient Details  Name: Patricia Dudley MRN: [id number] Date of Birth: Feb 21, 1930  Today's Date: 01/15/2022 OT Individual Time: 3299-2426 & 8341-9622 OT Individual Time Calculation (min): 56 min & 35 min   Short Term Goals: Week 2:  OT Short Term Goal 1 (Week 2): Pt will thread LB clothing over both feet with no more than CGA for balance and multimodal cueing as needed OT Short Term Goal 2 (Week 2): Pt will tolerate 2+ mins in stance during ADL task with no more than min A for balance OT Short Term Goal 3 (Week 2): Pt will attend to ADL task with min A for at least 2 mins to promote functional participation and sustained attention  Skilled Therapeutic Interventions/Progress Updates:  Session 1 Skilled OT intervention completed with focus on activity tolerance, functional transfers and ADL retraining in relation to motor planning. Pt received semi-side lying with NT feeding pt. No verbalized pain and no signs of pain/distress via facial expressions with pt able to report later in session shaking head "no" in response to if she had pain. Therapist provided education of functional eating position and specific diet of dys 1 and then nectar thickened liquids but intermittently is prohibited 2/2 aspiration. Minor pocketing noted, with suction used to clear mouth with max cues needed for opening mouth. Completed oral care with use of suction/brush, with total A. Pt still connected to IV, nurse in room per request to disconnect pt for functional mobility. Of note, pt with poor initiation and motor planning requiring significant increased time transition. Completed bed mobility max A for BLE and trunk management, sit > stand using RW with mod A and stand pivot min A +2 with physical assist needed for RW management. Transported pt in bathroom, with pt requiring 5+ attempts to stand at grab bars to pivot to commode, with min A +2 with increased time on successful attempt,  however max to total A for pivot to commode as pt attempted to sit prior to safe placement at [facility] with strict reliance on grab bars or hand placement with pt requiring physical assist to release grasp on grab bar/w/c to get hips centered on commode. Pt immediately continent of void only. Sit > stand with mod A of 1 with use of grab bars, then total A for toileting and min A of 2nd person for standing balance and +2 min A to chair. Seated at sink, pt able to initiate lathering soap in hands once placed and provided visual cue, then able to initiate hands to water with one step verbal command. Dressed LB with max A with use of figure 4 position, however max cues/hand over hand for following through. Pt was left seated in TIS chair, slightly reclined, belt alarm on and all needs in reach at end of session.   Session 2 Skilled OT intervention completed with focus on family education with 4 of 8 daughters present who plan to assist pt at home, regarding toilet transfers, self-care and ADL retraining. Pt received seated in w/c, with SLP at direct care handoff. Pt remained non-verbal during session, however no signs of distress or pain with facial expressions. Education provided on OT purpose/goals, motor planning and initiation deficits in relation to self-care tasks. Completed sit > stand without use of grab bar in bathroom with mod A with pt pushing up with BUE from w/c, with initiation to stand on 1st cue this session provided from pt's daughter. Pt's participation significantly increasing with daughters present.  Stand pivot to [facility] with grab bar/HHA and mod A for balance, total A for pants management. Pt unable to void/BM on commode, however changed brief with total A. Sit > stand using grab bar on L and HHA on RUE with mod A with pt self-limiting standing for about 5 seconds despite cues from daughter. Total A for donning pants over hips. Pt ambulated initially with +2 min A (2nd person was one of pt's daughters)  from commode to EOB with physical assist for RW and RLE advancement, then transitioned to max A of 1 as pt tolerated ambulation well, however ultimately required Max-total A to advance closer to bed and once at EOB for hip placement as pt tried on several accounts to sit prior to EOB (with w/c follow). Total A needed for hip placement on EOB as pt with poor command following and increased fatigue. Nursing in room to assist pt from EOB at direct care handoff.    Therapy Documentation Precautions:  Precautions Precautions: Fall Precaution Comments: aspiration precautions Restrictions Weight Bearing Restrictions: No    Therapy/Group: Individual Therapy  Derion Kreiter E Jovanni Eckhart 01/15/2022, 7:27 AM

## 2022-01-15 NOTE — Significant Event (Signed)
Date and time results received: 01/15/22 2130   Test: Chloride  Critical Value: <130  Name of Provider Notified: L. Foster  Orders Received? Or Actions Taken?: Orders Received - See Orders for details

## 2022-01-16 LAB — URINALYSIS, ROUTINE W REFLEX MICROSCOPIC
Bacteria, UA: NONE SEEN
Bilirubin Urine: NEGATIVE
Glucose, UA: 50 mg/dL — AB
Hgb urine dipstick: NEGATIVE
Ketones, ur: NEGATIVE mg/dL
Leukocytes,Ua: NEGATIVE
Nitrite: NEGATIVE
Protein, ur: 30 mg/dL — AB
Specific Gravity, Urine: 1.011 (ref 1.005–1.030)
pH: 6 (ref 5.0–8.0)

## 2022-01-16 LAB — BASIC METABOLIC PANEL
Anion gap: 10 (ref 5–15)
Anion gap: 7 (ref 5–15)
BUN: 75 mg/dL — ABNORMAL HIGH (ref 8–23)
BUN: 77 mg/dL — ABNORMAL HIGH (ref 8–23)
BUN: 80 mg/dL — ABNORMAL HIGH (ref 8–23)
CO2: 19 mmol/L — ABNORMAL LOW (ref 22–32)
CO2: 18 mmol/L — ABNORMAL LOW (ref 22–32)
CO2: 17 mmol/L — ABNORMAL LOW (ref 22–32)
Calcium: 8.7 mg/dL — ABNORMAL LOW (ref 8.9–10.3)
Calcium: 8.4 mg/dL — ABNORMAL LOW (ref 8.9–10.3)
Calcium: 7.9 mg/dL — ABNORMAL LOW (ref 8.9–10.3)
Chloride: >130 mmol/L (ref 98–111)
Chloride: 127 mmol/L — ABNORMAL HIGH (ref 98–111)
Chloride: 129 mmol/L — ABNORMAL HIGH (ref 98–111)
Creatinine, Ser: 3.47 mg/dL — ABNORMAL HIGH (ref 0.44–1.00)
Creatinine, Ser: 3.69 mg/dL — ABNORMAL HIGH (ref 0.44–1.00)
Creatinine, Ser: 3.6 mg/dL — ABNORMAL HIGH (ref 0.44–1.00)
GFR, Estimated: 12 mL/min — ABNORMAL LOW (ref >60)
GFR, Estimated: 11 mL/min — ABNORMAL LOW (ref >60)
GFR, Estimated: 12 mL/min — ABNORMAL LOW (ref >60)
Glucose, Bld: 98 mg/dL (ref 70–99)
Glucose, Bld: 371 mg/dL — ABNORMAL HIGH (ref 70–99)
Glucose, Bld: 347 mg/dL — ABNORMAL HIGH (ref 70–99)
Potassium: 4.7 mmol/L (ref 3.5–5.1)
Potassium: 4.5 mmol/L (ref 3.5–5.1)
Potassium: 5 mmol/L (ref 3.5–5.1)
Sodium: 160 mmol/L — ABNORMAL HIGH (ref 135–145)
Sodium: 155 mmol/L — ABNORMAL HIGH (ref 135–145)
Sodium: 153 mmol/L — ABNORMAL HIGH (ref 135–145)

## 2022-01-16 LAB — GLUCOSE, CAPILLARY
Glucose-Capillary: 93 mg/dL (ref 70–99)
Glucose-Capillary: 332 mg/dL — ABNORMAL HIGH (ref 70–99)
Glucose-Capillary: 324 mg/dL — ABNORMAL HIGH (ref 70–99)
Glucose-Capillary: 283 mg/dL — ABNORMAL HIGH (ref 70–99)

## 2022-01-16 MED ORDER — NYSTATIN 100000 UNIT/ML MT SUSP
5.0000 mL | Freq: Four times a day (QID) | OROMUCOSAL | Status: DC
  Administered 2022-01-16 – 2022-01-31 (×59): 500000 [IU] via ORAL
  Filled 2022-01-16 (×48): qty 5

## 2022-01-16 NOTE — Patient Care Conference (Signed)
Inpatient RehabilitationTeam Conference and Plan of Care Update Date: 01/16/2022   Time: 11:46 AM    Patient Name: Patricia Dudley      Medical Record Number: [id number]  Date of Birth: June 22, 1930 Sex: Female         Room/Bed: 4W07C/4W07C-01 Payor Info: Payor: [employer] MEDICARE / Plan: [place] PPO / Product Type: *No Product type* /    Admit Date/Time:  01/03/2022  2:52 PM  Primary Diagnosis:  Acute embolic stroke [facility])  Hospital Problems: Principal Problem:   Acute embolic stroke [facility])    Expected Discharge Date: Expected Discharge Date: 01/30/22  Team Members Present: Physician leading conference: Dr. Leeroy Cha Social Worker Present: Erlene Quan, BSW Nurse Present: Dorien Chihuahua, RN PT Present: Becky Sax, PT OT Present: Jennefer Bravo, OT SLP Present: Helaine Chess, SLP PPS Coordinator present : Gunnar Fusi, SLP     Current Status/Progress Goal Weekly Team Focus  Bowel/Bladder     Continent with toileting   Continent   Toileting protocol  Swallow/Nutrition/ Hydration   Dysphagia 1 with Nectar-thick liquids + aspiration precautions  Mod A for consuming least restrictive diet  Self-feeding as able and tolerance for recommended diet textures   ADL's   Still limited with slow initiation and impaired motor planning with flucuations in performance therefore still at Max to total A bathing/dressing, Max A toileting, mod-max A self-feeding and oral care 2/2 cues and hand over hand assist required despite physical abilities  mod A  family ed, functional participation, initiaiton, endurance   Mobility   bed mobility max A, sit<>stands min/max A, stand<>pivots min/mod A, gait 33ft with RW mod A +2, 4 steps 2 rails max A +2  upgraded to [name], however pt has recently required increased assist and use of stedy for transfers. May need to downgrade  functional mobility/transfers, generalized strengthening and endurance, NMR, dynamic standing  balance/coordination, gait training, and attention/initiation   Communication   Max to Total A for auditory comprehension and verbal expression  Max A for auditory comprehension, Total A for verbal expression  Trial use of eye gaze or pointing to words or pictures + yes/no board for additional visual support.   Safety/Cognition/ Behavioral Observations  Max A for focused attention for 1-3 minutes.  Mod A  Focused attention during functional tasks   Pain     N/A        Skin     N/A          Discharge Planning:  Patient 5 children plan to rotate care for patient. Checking in on patient 3x a day. Patient daughter completed family education   Team Discussion: Doing better with IVF;poor po intake. Patient with poor motor planning, initiation, little communication,and fatigue post BG infarct.   Patient on target to meet rehab goals: yes, patient able to transfer with min assist. Needs mod assist +2 to ambulate 20'. Does better with family. Needs max assist for bed mobility. Needs mod- max assist for self feeding and requires hand over hand assist. Goals for discharge set for mod assist overall.  *See Care Plan and progress notes for long and short-term goals.   Revisions to Treatment Plan:  N/A   Teaching Needs: Safety, assistance needed, medications, secondary risk management, transfers, toileting, etc  Current Barriers to Discharge: Decreased caregiver support and Incontinence  Possible Resolutions to Barriers: Family education HH follow up services DME: declined custom w/c for transport chair OP IVF infusion clinic     Medical Summary Current  Status: type 2 diabetes with uncontrolled hyperglycemia, severe hypernatremia, elevated chloride, worsening chornic kidney disease, dyshagia  Barriers to Discharge: Medical stability  Barriers to Discharge Comments: type 2 diabetes with uncontrolled hyperglycemia, severe hypernatremia, elevated chloride, worsening chornic kidney disease,  dyshagia Possible Resolutions to [employer] Focus: increased glipizide, started longacting Semglee and will titrate up today, increase D5W today, obtain urine studies today, continue close nehrology follow-up   Continued Need for Acute Rehabilitation Level of Care: The patient requires daily medical management by a physician with specialized training in physical medicine and rehabilitation for the following reasons: Direction of a multidisciplinary physical rehabilitation program to maximize functional independence : Yes Medical management of patient stability for increased activity during participation in an intensive rehabilitation regime.: Yes Analysis of laboratory values and/or radiology reports with any subsequent need for medication adjustment and/or medical intervention. : Yes   I attest that I was present, lead the team conference, and concur with the assessment and plan of the team.   Dorien Chihuahua B 01/16/2022, 2:18 PM

## 2022-01-16 NOTE — Progress Notes (Signed)
[facility] Progress Note   88 y.o. female PAF, AV malformation of the duodenum, hypothyroidism, DM, HTN, HLD, CKD 3b presenting with aphasia, left gaze and slurred speech but outside the window for lytics. CT / MRI showed left basal ganglia/ internal capsule infarct. Also treated for aspiration PNA with Rocephin and Azithromycin -> Augmentin. Patient on a Dysphagia 1 diet initially and then eventually now to a pureed diet and she also requires assistance to eat.   Assessment/ Plan:   Hypernatremia - considered chronic at this point and will not want to correct to quickly given risk of cerebral edema. Hopefully we can correct in the next 48-72 hrs. Her free water deficit is 4L and goal is to correct to ~151-154 / 24hrs. Will be difficult to calculate free water clearance if she's in a diaper; not convinced she needs a foley just to calculate a free water clearance. - Started D5W at 75 ml/hr -> 1/2NS 75 overnight bec of hyperglycemia; incr to 129ml/hr + 8oz water (thickened) q2-4hr and follow serial chemistry panels. Goal is Na ~152-154 by tomorrow AM. Acute on CKD 3b/IV - acute component likely from prerenal azotemia given the hypernatremia but will check urine studies. Afib on lopressor for rate control, unable to anticoagulate because of symptomatic AVM's in the past. Anemia - transfuse as needed.  S/p CVA HTN  DM Hypothyroidism on Synthroid.  Subjective:   Doing rehab and tolerating; no events overnight other than having to switch to 1/2NS bec of hyperglycemia with the D5W.   Objective:   BP 120/67 (BP Location: Left Arm)   Pulse (!) 51   Temp 98.1 F (36.7 C) (Oral)   Resp 18   Ht 5\' 5"  (1.651 m)   Wt 55.8 kg   SpO2 100%   BMI 20.47 kg/m   Intake/Output Summary (Last 24 hours) at 01/16/2022 1791 Last data filed at 01/16/2022 0000 Gross per 24 hour  Intake 1297.02 ml  Output --  Net 1297.02 ml   Weight change:    Physical Exam: GEN: NAD, alert and  cooperative, NCAT HEENT: No conjunctival pallor, EOMI NECK: Supple, no thyromegaly LUNGS: CTA B/L no rales but poor effort CV: irreg ABD: SNDNT +BS  EXT: No lower extremity edema SKIN: poor turgor  Imaging: No results found.  Labs: BMET Recent Labs  Lab 01/09/22 1325 01/15/22 0539 01/15/22 2014 01/16/22 0550  NA 142 159* 157* 160*  K 4.5 5.1 5.0 4.7  CL 113* >130* >130* >130*  CO2 19* 22 20* 19*  GLUCOSE 183* 143* 368* 98  BUN 47* 75* 82* 75*  CREATININE 2.69* 3.52* 3.71* 3.47*  CALCIUM 8.6* 8.7* 8.4* 8.7*   CBC Recent Labs  Lab 01/09/22 1325 01/14/22 0618  WBC 12.4* 8.5  NEUTROABS 7.5  --   HGB 9.7* 9.9*  HCT 30.7* 31.2*  MCV 88.0 88.4  PLT 400 521*    Medications:     amLODipine  10 mg Oral Daily   aspirin EC  81 mg Oral Daily   Gerhardt's butt cream   Topical QID   glipiZIDE  10 mg Oral QAC breakfast   heparin  5,000 Units Subcutaneous Q8H   insulin aspart  0-5 Units Subcutaneous QHS   insulin aspart  0-9 Units Subcutaneous TID WC   insulin glargine-yfgn  11 Units Subcutaneous QHS   latanoprost  1 drop Both Eyes QHS   levothyroxine  112 mcg Oral Q0600   magnesium gluconate  250 mg Oral QHS   multivitamin with  minerals  1 tablet Oral Daily   pantoprazole  40 mg Oral Daily   polycarbophil  625 mg Oral Daily   vitamin B-12  100 mcg Oral Daily      Otelia Santee, MD 01/16/2022, 8:29 AM

## 2022-01-16 NOTE — Progress Notes (Signed)
Physical Therapy Session Note  Patient Details  Name: Patricia Dudley MRN: [id number] Date of Birth: Nov 16, 1929  Today's Date: 01/16/2022 PT Individual Time: 0731-0842 PT Individual Time Calculation (min): 71 min   Short Term Goals: Week 1:  PT Short Term Goal 1 (Week 1): Pt will roll side to side w/ max A PT Short Term Goal 2 (Week 1): Pt will transfer sup to sit w/ max A PT Short Term Goal 3 (Week 1): Pt will transfer sit to stand w/ max A PT Short Term Goal 4 (Week 1): Pt will amb w/ LRAD x 10'. Week 2:  PT Short Term Goal 1 (Week 2): pt will transfer bed<>chair with LRAD and mod A of 1 consistantly PT Short Term Goal 2 (Week 2): pt will transfer sit<>stand with LRAD and mod A of 1 consistantly PT Short Term Goal 3 (Week 2): pt will ambulate 84ft with LRAD and mod A of 1  Skilled Therapeutic Interventions/Progress Updates:   Received pt sidelying in bed asleep, with covers pulled over face. Pt did not respond to verbal/tactile stimuli but only when therapist removed bed sheets - to which pt attempted to pull back over her. Session with emphasis on functional mobility/transfers, dressing, toileting, attention/initiation, motor planning, dynamic standing balance/coordination, and gait training. Pt did not initate any movement to sit EOB, therefore transferred semi-reclined<>sitting EOB with total A and scooted hips to EOM with total A using chuck pad. However, pt able to maintain static sitting balance with CGA/close supervision with no posterior LOB this morning. Pt much more alert once sitting EOB and donned pants with max A and pt did initiate pulling pants up to thighs. Stood from EOB with RW and mod A and required max A to pull pants over hips. Stand<>pivot bed<>TIS WC with RW and mod fading to max A to safely pivot hips into WC, as pt attempting to sit before turning all the way around. Noted pt incontinent and changed at 5:30am, therefore took pt to bathroom to try to void again. Pt  required increased time, multiple attempts, and max A to stand from TIS [facility] and heavy mod A to pivot with RW to toilet with bedside commode over top. Doffed brief and pants with total A and required mod A to position hips back on commode - pt able to void. Stood from commode with heavy min A, and required max A to pull pants over hips, and transferred back into WC with mod A with total A to control RW. Of note, pt requires manual facilitation to lower hips back into WC seat as pt will attempt to sit down on the edge. Combed hair dependently and transported to/from dayroom in TIS WC. Sit<>stand with RW and min of 2 ranging to max of 1 and pt ambulated 54ft x 1 and 83ft x 1 with RW and mod A with +2 for equipment management. Pt attempting to sit once in standing and required assist to advance/steer RW and to advance R foot due to poor foot clearance R/L. Nephrology arrived briefly during session to discuss fluids. Pt's breakfast tray arrived - notified RN to provide full supervision/assist due to time restrictions. Concluded session with pt reclined in TIS WC, needs within reach, and seatbelt alarm on.   Therapy Documentation Precautions:  Precautions Precautions: Fall Precaution Comments: aspiration precautions Restrictions Weight Bearing Restrictions: No  Therapy/Group: Individual Therapy Alfonse Alpers PT, DPT  01/16/2022, 6:51 AM

## 2022-01-16 NOTE — Progress Notes (Signed)
Calorie Count Note  48 - hour calorie count ordered.  Diet: Dysphagia 1, Nectar Thick Liquids Supplements: Vital Cuisine Shake BID  Day 2 5/23 Dinner: 177 kcal, 11 gm protein 5/24 Breakfast: No meal ticket 5/24 Lunch: No meal ticket Supplements: 260 kcal, 11 gm protein  Total intake: 437 kcal (27% of minimum estimated needs)  22 protein (28% of minimum estimated needs)  Nutrition Diagnosis: Moderate Malnutrition related to chronic illness as evidenced by moderate fat depletion, percent weight loss, mild muscle depletion.  Goal: Patient will meet greater than or equal to 90% of their needs.  Intervention:  Vital Cuisine Shake - BID   Hermina Barters RD, LDN Clinical Dietitian See [facility] for contact information.

## 2022-01-16 NOTE — Progress Notes (Addendum)
Speech Language Pathology Daily Session Note  Patient Details  Name: Patricia Dudley MRN: [id number] Date of Birth: Sep 04, 1929  Today's Date: 01/16/2022 SLP Individual Time: 1015-1055 SLP Individual Time Calculation (min): 40 min  Short Term Goals: Week 2: SLP Short Term Goal 1 (Week 2): Pt will follow 3 commands within a functional context given Mod multimodal A- Pt followed 3 commands within a functional context (eating, oral care, and repositioning) given Mod multimodal A.  SLP Short Term Goal 2 (Week 2): Pt will communicate wants, needs, ideas, and thoughts x 5 given Max  A for use of mulitmodal communication methods. - x 3 with use of yes/no head nods and Max A. Only verbalizations were on request for repetition of familiar words.  SLP Short Term Goal 3 (Week 2): Pt will consume current diet with minimal overt s/s of aspiration with Mod verbal cues for use of swallowing compensatory strategies. - Pt consumed 8 oz of NTL via straw with Min A for motor initiation to bring cup/straw to mouth once cup was placed in her hand. No overt s/sx concerning for aspiration observed; however, unable to assess vocal quality between PO trials given pt is mainly non-verbal.   SLP Short Term Goal 4 (Week 2): Pt will maintain sustianed attention for 5 minutes with Mod A. - Maintained sustained attention for ~2-3 minutes with Mod A.  Skilled Therapeutic Interventions: Pt seen this date for skilled ST intervention targeting communication and dysphagia goals outlined above. Pt received lethargic and OOB in TIS. Receiving IV fluids upon arrival. Eyes open and smiled when SLP stated her name. Agreeable to ST intervention.  SLP facilitated today's session by providing Mod A for command following within functional tasks and Mod A for brief focused attention. Continues to demonstrate inconsistency in her willingness/ability to communicate via yes/no questions via head nod/shake. Did not communicate verbally other  than to verbally repetition single word at [facility] request <50% of the time. No overt s/sx concerning for aspiration with NTL via straw given Total A for cup placement in hand and Mod A for motor initiation for task progression (continuing to take sips). Attempted to probe pt's stimulability for low-tech AAC picture board by assessing receptive ID of various nouns, verbs, and emotions in a field of 2. Pt with inconsistent eye gaze for target word, ultimately achieving <50% accuracy for verbs and emotions and ~50% for nouns with this form of communication. Pt unable to point to pictures without [facility] assistance. Brief improvement was noted with yes/no responses via head gestures with use of visual yes/no board. Will continue to probe response to pictures, words, and yes/no boards.    Pt left in TIS with all safety measures in place. Call bell within reach and all immediate needs met. Continue per current ST POC.  Pain No pain reported; NAD appreciated   Therapy/Group: Individual Therapy  [name] A Floyde Dingley 01/16/2022, 1:02 PM

## 2022-01-16 NOTE — Progress Notes (Signed)
Inpatient Diabetes Program Recommendations  AACE/ADA: New Consensus Statement on Inpatient Glycemic Control (2015)  Target Ranges:  Prepandial:   less than 140 mg/dL      Peak postprandial:   less than 180 mg/dL (1-2 hours)      Critically ill patients:  140 - 180 mg/dL   Lab Results  Component Value Date   GLUCAP 93 01/16/2022   HGBA1C 6.5 (H) 12/25/2021    Review of Glycemic Control  Latest Reference Range & Units 01/15/22 05:49 01/15/22 09:38 01/15/22 11:30 01/15/22 16:56  Glucose-Capillary 70 - 99 mg/dL 126 (H) 248 (H) 206 (H) 247 (H)   Diabetes history: DM 2 Outpatient Diabetes medications:  Glucotrol 10 mg daily Current orders for Inpatient glycemic control:  Novolog sensitive tid with meals and HS Glucotrol 10 mg daily Semglee 11 units q HS Inpatient Diabetes Program Recommendations:    May consider adding Novolog meal coverage 2 units tid with meals (hold if patient eats less than 50% or NPO).   Thanks,  Adah Perl, RN, BC-ADM Inpatient Diabetes Coordinator Pager [phone]  (8a-5p)

## 2022-01-16 NOTE — Progress Notes (Signed)
Occupational Therapy Session Note  Patient Details  Name: Patricia Dudley MRN: [id number] Date of Birth: [birth year]  Today's Date: 01/16/2022 OT Individual Time: 1415-1530 OT Individual Time Calculation (min): 75 min    Short Term Goals: Week 2:  OT Short Term Goal 1 (Week 2): Pt will thread LB clothing over both feet with no more than CGA for balance and multimodal cueing as needed OT Short Term Goal 2 (Week 2): Pt will tolerate 2+ mins in stance during ADL task with no more than min A for balance OT Short Term Goal 3 (Week 2): Pt will attend to ADL task with min A for at least 2 mins to promote functional participation and sustained attention  Skilled Therapeutic Interventions/Progress Updates:  Skilled OT intervention completed with focus on motor planning and initiation within functional task, toileting and ambulatory transfers. Pt received upright in TIS chair, awake, with increased verbalizations made by pt this session. Pt stating "how are you" after therapist asked same question, as well as "no" to if in pain. Also stating "nowhere to go" 3 separate times, however unable to respond to therapist when asking for clarification.   To promote initiation within familiar functional task, therapist positioned TIS chair perpendicular to bed, and assembled pile of laundry items including wash cloths, towels, pillow cases. Pt unable to initiate reaching toward pile to grab item when prompted, however once handed a wash cloth and provided one verbal cue of "fold this" pt was able to complete without physical or further cueing assist. Prompted pt to place folded cloth on bed in front of her to promote anterior weight shift needed for prepping to stand vs hand over hand placement. Facilitory cue needed behind trunk to initiate lean forward and hand over hand for beginning part of reach, then with visual cue was able to place cloth on bed. Able to complete 3 wash cloths at same assist, then task  upgraded to larger item I.e. towel. Pt demonstrated initiation with folding, however difficulty with orienting sides of towel and which way to fold, with mod A needed throughout, utilizing forward chaining method. Same difficulty demonstrated with pillow case.  Per timed toileting, pt due for toileting. Transported in w/c to bathroom in front of grab bars. 3 trials needed to come to full stand, with BUE on arm rest pushing up to stand, then hand over hand assist provided for transitioned LUE to grab bar and RUE to HHA. Mod A stand pivot from chair <> BSC with RLE cues/advancement needed. Total A for doffing brief/pants. Despite increased time, pt unable to actively void, but did have incontinent void in brief. Sit > stand from commode on multiple accounts intermittently during toileting 2/2 pt fatigue with 2nd person standing in front of pt 2/2 heavy anterior lean during stands/standing. Total A for pericare and changed sacral dressing per nursing, with CGA to min A needed for balance. Max A needed for donning over hips with pt not attempting to assist this session.  In room ambulatory transfer using RW completed with unreasonable amount of time to initiate, however able to do with 1 person providing initial min A increasing to mod-max A 2/2 poor command following and increased fatigue. Physical advancement needed for RW and RLE, with pt attempting to rotate and backwards walk rest of way to bed, with pt able to do so with time and guidance and 2nd person assisting with IV pole and guarding pt's anterior lean. Max A needed for bed mobility. Trendelenburg bed position  utilized with pt able to initiate bending bilateral knees/feet on bed with tactile/verbal cue and max A for boosting. Pt was left upright in bed, with family present, nurse aware of IV machine beeping, and all needs in reach at end of session.   Therapy Documentation Precautions:  Precautions Precautions: Fall Precaution Comments: aspiration  precautions Restrictions Weight Bearing Restrictions: No    Therapy/Group: Individual Therapy  Yaquelin Langelier E Mckinzie Saksa 01/16/2022, 7:49 AM

## 2022-01-16 NOTE — Progress Notes (Signed)
PROGRESS NOTE   Subjective/Complaints: Eating breakfast Appreciate Dr. Aron Baba assistance inn management of hypernatremia- increasing D5W  ROS: Limited by language, unable to answer commands.  Objective:   No results found. Recent Labs    01/14/22 0618  WBC 8.5  HGB 9.9*  HCT 31.2*  PLT 521*    Recent Labs    01/15/22 2014 01/16/22 0550  NA 157* 160*  K 5.0 4.7  CL >130* >130*  CO2 20* 19*  GLUCOSE 368* 98  BUN 82* 75*  CREATININE 3.71* 3.47*  CALCIUM 8.4* 8.7*     Intake/Output Summary (Last 24 hours) at 01/16/2022 1101 Last data filed at 01/16/2022 0943 Gross per 24 hour  Intake 1537.02 ml  Output --  Net 1537.02 ml        Physical Exam: Vital Signs Blood pressure 120/67, pulse (!) 51, temperature 98.1 F (36.7 C), temperature source Oral, resp. rate 18, height 5\' 5"  (1.651 m), weight 55.8 kg, SpO2 100 %.  Gen: no distress, normal appearing, BMI 20.47, fatigued HENT: oral mucosa pink and moist, NCAT, requires assistance to eat Eyes:     General:    In bed, covers mouth with blanket    Right eye: No discharge.        Left eye: No discharge.     Comments: Left lid closed R eye lid more open  Mouth: denitrues in place, Mucus membranes a little dry Cardiovascular:     Rate and Rhythm: irregular, a fib, had episodes of bradycardia    Heart sounds: Normal heart sounds. No murmur heard.   No gallop.  Pulmonary:     Effort: Pulmonary effort is normal. No respiratory distress.     Breath sounds: Normal breath sounds. No wheezing or rales.  Abdominal:     General: Bowel sounds are normal. There is no distension.     Palpations: Abdomen is soft.     Tenderness: There is no abdominal tenderness.  Musculoskeletal:     Cervical back: Neck supple. Protracted posture    Comments: Limited participation in movement today, squeeze hand: L grip/biceps 5-/5 BUE: 3/5 grip/biceps.  Would not participate in RLE  and LLE movement. Skin:    General: Skin is warm and dry.  Neurological:     Mental Status: She is alert.     Comments: Squeezes hands on command, did protrude tongue on command today, made attempt to smile when asked. No verbal expression shaking head yes or no-  Was able to remain awake and alert during exam today Ambulating ModA with RW. +echolalia Psychiatric:     Comments: Smiling,  +echolalia GU: continent of bladder at times, incontinent of bowel  Assessment/Plan: 1. Functional deficits which require 3+ hours per day of interdisciplinary therapy in a comprehensive inpatient rehab setting. Physiatrist is providing close team supervision and 24 hour management of active medical problems listed below. Physiatrist and rehab team continue to assess barriers to discharge/monitor patient progress toward functional and medical goals  Care Tool:  Bathing    Body parts bathed by patient: Left lower leg, Right lower leg, Left upper leg, Right upper leg, Front perineal area   Body parts bathed by helper:  Right arm, Left arm, Chest, Abdomen, Buttocks, Face Body parts n/a: Front perineal area, Buttocks   Bathing assist Assist Level: Total Assistance - Patient < 25%     Upper Body Dressing/Undressing Upper body dressing   What is the patient wearing?: Pull over shirt    Upper body assist Assist Level: Total Assistance - Patient < 25%    Lower Body Dressing/Undressing Lower body dressing      What is the patient wearing?: Incontinence brief, Pants     Lower body assist Assist for lower body dressing: Maximal Assistance - Patient 25 - 49%     Toileting Toileting    Toileting assist Assist for toileting: Total Assistance - Patient < 25%     Transfers Chair/bed transfer  Transfers assist     Chair/bed transfer assist level: Maximal Assistance - Patient 25 - 49%     Locomotion Ambulation   Ambulation assist   Ambulation activity did not occur: Safety/medical  concerns  Assist level: 2 helpers Assistive device: Walker-rolling Max distance: 32ft   Walk 10 feet activity   Assist  Walk 10 feet activity did not occur: Safety/medical concerns  Assist level: 2 helpers Assistive device: Walker-rolling   Walk 50 feet activity   Assist Walk 50 feet with 2 turns activity did not occur: Safety/medical concerns         Walk 150 feet activity   Assist Walk 150 feet activity did not occur: Safety/medical concerns         Walk 10 feet on uneven surface  activity   Assist Walk 10 feet on uneven surfaces activity did not occur: Safety/medical concerns         Wheelchair     Assist Is the patient using a wheelchair?: No   Wheelchair activity did not occur: Safety/medical concerns         Wheelchair 50 feet with 2 turns activity    Assist    Wheelchair 50 feet with 2 turns activity did not occur: Safety/medical concerns       Wheelchair 150 feet activity     Assist  Wheelchair 150 feet activity did not occur: Safety/medical concerns       Blood pressure 120/67, pulse (!) 51, temperature 98.1 F (36.7 C), temperature source Oral, resp. rate 18, height 5\' 5"  (1.651 m), weight 55.8 kg, SpO2 100 %.    Medical Problem List and Plan: 1. 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             -patient may  shower             -ELOS/Goals: expected dc 6/7 min A to mod A and Max a for auditory comprehension and Total A for verbal comprehension  -Continue CIR PT, OT,SLP  -Interdisciplinary Team Conference today   2.  Anemia: continue Heparin [place] TID. Transfused 1U on 5/15. Post-transfusion hemoglobin trending upward nicely, repeat Monday              -antiplatelet therapy: aspirin 81 mg daily 3. Pain Management: Tylenol prn 4. Mood: LCSW to evaluate and provide emotional support             -antipsychotic agents: n/a 5. Neuropsych: This patient is not  capable of making decisions on her own behalf. 6. Skin/Wound Care: Routine skin care checks 7. Dysphagia             -- continue Dysphagia 1 diet with thin liquids.  Crushed meds  with applesauce.  SLP evaluation.             -- Continue nutritional supplements, B12, MVI  -5/22 Start normal saline, she is not drinking much fluid PO 8: Paroxysmal atrial fibrillation: continue Lopressor 50 mg BID --home torsemide on hold --no AC due to small bowel AVMs Add magnesium gluconate 250mg  HS    01/16/2022    3:59 AM 01/15/2022    8:10 PM 01/15/2022    2:31 PM  Vitals with BMI  Systolic [phone]  Diastolic 67 64 66  Pulse 51 94 96    9: Diabetes mellitus: CBGs qAC,qHS; monitor intake and activity --continue SSI --increase glipizide back to 10mg  (home dose) Increase Semglee to 11U started HS D/c Ensure between meals 10: Hypertension: d/c Lisinopril due to worsening kidney function, continue amlodipine 10 mg, Lopressor. Repeat creatinine on Monday given increase in Lisinopril.  5/13 BP controlled today. 5/14 some variability in B/P, with mild elevation, some episodes of bradycardia (HR 46 at 051 today). -D/c lisinopril today since BUN 28->68 and Crt 2.28->3.20, with increase in past 2 days.  Hold lopressor with bradycardia episodes.  Will see how B/P trends on amlodipine 10 mg, may consider adding hydralazine if B/p is elevated.   -now hypotensive, change fluids to NS HS 12 hours -5/22 BP a little stable currently, will continue to monitor      01/16/2022    3:59 AM 01/15/2022    8:10 PM 01/15/2022    2:31 PM  Vitals with BMI  Systolic [phone]  Diastolic 67 64 66  Pulse 51 94 96   11: Hyperlipidemia: continue rosuvastatin 10 mg 12: Moderate malnutrition: continue supplements 13: Chronic kidney disease stage 3b: follow-up BMP     -5/13 B/P is in the 120's today. Pt had lisinopril increased to 40 mg yesterday for elevated B/P.  Since lisinopril may increase Crt, decrease lisinopril to  20 mg daily today and push po intake and repeat BMP. -5/14 Crt up to 3.2 and BUN up to 68.  Fluid bolus (1L NS) and continuous fluid (0.45 NS at 75 ml/hr) given for rise in BUN and Crt. -d/c lisinopril (per Dr. Christella Noa) since could be a possible cause of elevated Crt . Cr elevated, nephrology consulted. Check urine studies     Latest Ref Rng & Units 01/16/2022    5:50 AM 01/15/2022    8:14 PM 01/15/2022    5:39 AM  BMP  Glucose 70 - 99 mg/dL 98   368   143    BUN 8 - 23 mg/dL 75   82   75    Creatinine 0.44 - 1.00 mg/dL 3.47   3.71   3.52    Sodium 135 - 145 mmol/L 160   157   159    Potassium 3.5 - 5.1 mmol/L 4.7   5.0   5.1    Chloride 98 - 111 mmol/L >130   >130   >130    CO2 22 - 32 mmol/L 19   20   22     Calcium 8.9 - 10.3 mg/dL 8.7   8.4   8.7      14: Glaucoma: continue latanoprost drops 15: Hypothyroidism: continue Synthroid 16: Diarrhea/loose stools: monitor; getting loperamide 2 mg daily (BID prn) -5/13 Continue current regimen. 17: Bowel and bladder incontinence: start bowel and bladder program 18. Daytime somnolence: [employer] and SW if therapy can be scheduled a little later in the day and if daughters  can be present to help motivate her 19. Bradycardia: discontinue lopressor.  20. History of GI bleed: start protonix.  21. Hypotension: resolved, hemoglobin reviewed and stable 22. Neurogenic bladder: bladder program ordered starting 5/11. 23. Stool incontinence: fiber added to bulk stool 24. Screening for vitamin D deficiency: check vitamin D level on Monday 25. Fatigue:   -5/22 Iron 68, HGB slightly improved to 9.9 26. Elevated chloride: notified nephrology 27. Hypernatremia Na 160: Nephrology consulted. Increase D5W to 133ml/hr   LOS: 13 days A FACE TO FACE EVALUATION WAS PERFORMED  Natacia Chaisson P Royalti Schauf 01/16/2022, 11:01 AM

## 2022-01-17 LAB — BASIC METABOLIC PANEL
Anion gap: 7 (ref 5–15)
Anion gap: 6 (ref 5–15)
Anion gap: 7 (ref 5–15)
BUN: 69 mg/dL — ABNORMAL HIGH (ref 8–23)
BUN: 66 mg/dL — ABNORMAL HIGH (ref 8–23)
BUN: 72 mg/dL — ABNORMAL HIGH (ref 8–23)
CO2: 20 mmol/L — ABNORMAL LOW (ref 22–32)
CO2: 18 mmol/L — ABNORMAL LOW (ref 22–32)
CO2: 19 mmol/L — ABNORMAL LOW (ref 22–32)
Calcium: 8.3 mg/dL — ABNORMAL LOW (ref 8.9–10.3)
Calcium: 8.4 mg/dL — ABNORMAL LOW (ref 8.9–10.3)
Calcium: 7.9 mg/dL — ABNORMAL LOW (ref 8.9–10.3)
Chloride: 129 mmol/L — ABNORMAL HIGH (ref 98–111)
Chloride: 130 mmol/L — ABNORMAL HIGH (ref 98–111)
Chloride: 125 mmol/L — ABNORMAL HIGH (ref 98–111)
Creatinine, Ser: 3.21 mg/dL — ABNORMAL HIGH (ref 0.44–1.00)
Creatinine, Ser: 3.19 mg/dL — ABNORMAL HIGH (ref 0.44–1.00)
Creatinine, Ser: 3.38 mg/dL — ABNORMAL HIGH (ref 0.44–1.00)
GFR, Estimated: 13 mL/min — ABNORMAL LOW (ref >60)
GFR, Estimated: 13 mL/min — ABNORMAL LOW (ref >60)
GFR, Estimated: 12 mL/min — ABNORMAL LOW (ref >60)
Glucose, Bld: 86 mg/dL (ref 70–99)
Glucose, Bld: 217 mg/dL — ABNORMAL HIGH (ref 70–99)
Glucose, Bld: 321 mg/dL — ABNORMAL HIGH (ref 70–99)
Potassium: 4.4 mmol/L (ref 3.5–5.1)
Potassium: 5.3 mmol/L — ABNORMAL HIGH (ref 3.5–5.1)
Potassium: 4.6 mmol/L (ref 3.5–5.1)
Sodium: 156 mmol/L — ABNORMAL HIGH (ref 135–145)
Sodium: 154 mmol/L — ABNORMAL HIGH (ref 135–145)
Sodium: 151 mmol/L — ABNORMAL HIGH (ref 135–145)

## 2022-01-17 LAB — CREATININE, URINE, RANDOM: Creatinine, Urine: 54.89 mg/dL

## 2022-01-17 LAB — SODIUM, URINE, RANDOM: Sodium, Ur: 75 mmol/L

## 2022-01-17 LAB — GLUCOSE, CAPILLARY
Glucose-Capillary: 80 mg/dL (ref 70–99)
Glucose-Capillary: 174 mg/dL — ABNORMAL HIGH (ref 70–99)
Glucose-Capillary: 200 mg/dL — ABNORMAL HIGH (ref 70–99)
Glucose-Capillary: 235 mg/dL — ABNORMAL HIGH (ref 70–99)

## 2022-01-17 MED ORDER — INSULIN GLARGINE-YFGN 100 UNIT/ML ~~LOC~~ SOLN
12.0000 [IU] | Freq: Every day | SUBCUTANEOUS | Status: DC
  Administered 2022-01-17 – 2022-01-19 (×3): 12 [IU] via SUBCUTANEOUS
  Filled 2022-01-17 (×4): qty 0.12

## 2022-01-17 NOTE — Progress Notes (Signed)
Patient ID: Patricia Dudley, female   DOB: 02/08/1930, 88 y.o.   MRN: [id number]  Sw made attempt to call patient daughters to arrange additional family education. Left detailed VM. SW ill continue to follow up.

## 2022-01-17 NOTE — Progress Notes (Signed)
Speech Language Pathology Daily Session Note  Patient Details  Name: Patricia Dudley MRN: [id number] Date of Birth: Oct 19, 1929  Today's Date: 01/17/2022 SLP Individual Time: 4098-1191 SLP Individual Time Calculation (min): 70 min  Short Term Goals: Week 2: SLP Short Term Goal 1 (Week 2): Pt will follow 3 commands within a functional context given Mod multimodal A. - 3 commands within a functional context given Mod multimodal A.  SLP Short Term Goal 2 (Week 2): Pt will communicate wants, needs, ideas, and thoughts x 5 given Max  A for use of mulitmodal communication methods. - x 5 with Max A for use of multimodal communication methods.  SLP Short Term Goal 3 (Week 2): Pt will consume current diet with minimal overt s/s of aspiration with Mod verbal cues for use of swallowing compensatory strategies. - Mod-Max A to encourage A-P transit with puree intake. No pharyngeal s/sx c/f aspiration.  SLP Short Term Goal 4 (Week 2): Pt will maintain sustianed attention for 5 minutes with Mod A. - Sustained attention to trials of ice chips and water for 5+ minutes with Mod A.  Skilled Therapeutic Interventions: Pt seen this date for skilled ST intervention targeting communication and dysphagia goals outlined above. Pt received lethargic, lying semi-reclined in bed. Brief checked and soaked through the clothes and socks. Pt cleaned and changed with help from NT. Transferred from bed to TIS with use of Stedy + 2. Oral care completed via suction toothbrush with Max A. Agreeable to ST intervention.   SLP facilitated today's session by providing the following skilled ST interventions within the context of functional tx tasks: implementation of supported communication interventions (simple language, providing choices via use of objects vs pictures, yes/no with use of visual support (high-tech AAC application, direct commands, extended wait time, and model prompts), Max A faded to Mod A for self-feeding,  therapeutic trials of ice chips and thin liquid via tsp, and use of tactile, model, and visual supports to promote improved motor planning/initiation and command following. During therapeutic trials of ice chips and thin liquid via tsp, pt followed commands to complete effortful swallow and falsetto glide with Mod A, as well as phonate "ah" after each trial. Vocal quality remained clear and consistent with baseline; therefore, recommend a repeat instrumental swallow study to assess readiness for liquid upgrade. During AM meal, pt seated in TIS and exhibited no oral nor pharyngeal s/sx concerning for aspiration with NTL via straw. Puree intake was complicated by prolonged oral phase, incomplete A-P transit, and motor perseveration of mastication motor plan. Benefited from multimodal cues to include use of dry spoon and light pressure to center of tongue + verbal cues to initiate swallow. Overall, pt with improved attention and participation, in comparison to previous sessions. Continues to intermittently communicate via facial expressions and head nodding, though did verbalize yes and no x 2 when provided with visual support of high-tech AAC application. Low-tech, yes/no AAC board left at [facility]; communicated with team via [place] chat re: recommendations.  Pt left in TIS with direct hand off to NT to continue feeding. Call bell within reach and all immediate needs met. Continue per current ST POC.  Pain No indication of pain with NAD  Therapy/Group: Individual Therapy  Narmeen Kerper A Elmira Olkowski 01/17/2022, 12:14 PM

## 2022-01-17 NOTE — Plan of Care (Signed)
  Problem: Consults Goal: RH STROKE PATIENT EDUCATION Description: See Patient Education module for education specifics  Outcome: Progressing   Problem: RH BOWEL ELIMINATION Goal: RH STG MANAGE BOWEL WITH ASSISTANCE Description: STG Manage Bowel with min Assistance. Outcome: Progressing Flowsheets (Taken 01/17/2022 1735) STG: Pt will manage bowels with assistance: 6-Modified independent   Problem: RH BLADDER ELIMINATION Goal: RH STG MANAGE BLADDER WITH ASSISTANCE Description: STG Manage Bladder With min Assistance Outcome: Progressing   Problem: RH SAFETY Goal: RH STG ADHERE TO SAFETY PRECAUTIONS W/ASSISTANCE/DEVICE Description: STG Adhere to Safety Precautions With cues Assistance/Device. Outcome: Progressing   Problem: RH PAIN MANAGEMENT Goal: RH STG PAIN MANAGED AT OR BELOW PT'S PAIN GOAL Description: At or below level 4 with prns Outcome: Progressing   Problem: RH KNOWLEDGE DEFICIT Goal: RH STG INCREASE KNOWLEDGE OF DIABETES Description: Patient and daughter will be able to manage DM with medications and dietary modifications using handouts and educational materials independently Outcome: Progressing Goal: RH STG INCREASE KNOWLEDGE OF DYSPHAGIA/FLUID INTAKE Description: Patient and dtr will be able to manage Dysphagia, medications and dietary modifications using handouts and educational materials independently Outcome: Progressing Goal: RH STG INCREASE KNOWLEGDE OF HYPERLIPIDEMIA Description: Patient and dtr will be able to manage HLD with medications and dietary modifications using handouts and educational materials independently Outcome: Progressing Goal: RH STG INCREASE KNOWLEDGE OF STROKE PROPHYLAXIS Description: Patient and dtr will be able to manage secondary stroke risks with medications and dietary modifications using handouts and educational materials independently Outcome: Progressing   Problem: RH KNOWLEDGE DEFICIT Goal: RH STG INCREASE KNOWLEDGE OF  HYPERTENSION Description: Patient and dtr will be able to manage HTN with medications and dietary modifications using handouts and educational materials independently Outcome: Progressing   Problem: RH Vision Goal: RH LTG Vision (Specify) Outcome: Progressing

## 2022-01-17 NOTE — Progress Notes (Signed)
PROGRESS NOTE   Subjective/Complaints: More alert and communicative Scheduled for MBS tomorrow CKD improving Na slowly trending down, up to 156 today, continue D5W  ROS: Limited by language, unable to answer commands.  Objective:   No results found. No results for input(s): WBC, HGB, HCT, PLT in the last 72 hours.   Recent Labs    01/16/22 1947 01/17/22 0546  NA 153* 156*  K 5.0 4.4  CL 129* 129*  CO2 17* 20*  GLUCOSE 347* 86  BUN 80* 69*  CREATININE 3.60* 3.21*  CALCIUM 7.9* 8.3*     Intake/Output Summary (Last 24 hours) at 01/17/2022 1035 Last data filed at 01/17/2022 0900 Gross per 24 hour  Intake 900 ml  Output --  Net 900 ml        Physical Exam: Vital Signs Blood pressure (!) 170/83, pulse (!) 52, temperature 97.6 F (36.4 C), temperature source Oral, resp. rate 18, height 5\' 5"  (1.651 m), weight 55.8 kg, SpO2 100 %.  Gen: no distress, normal appearing, BMI 20.47, fatigued HENT: oral mucosa pink and moist, NCAT, requires assistance to eat Eyes:     General:    In bed, covers mouth with blanket    Right eye: No discharge.        Left eye: No discharge.     Comments: Left lid closed R eye lid more open  Mouth: denitrues in place, Mucus membranes a little dry Cardiovascular:     Rate and Rhythm: irregular, a fib, had episodes of bradycardia    Heart sounds: Normal heart sounds. No murmur heard.   No gallop.  Pulmonary:     Effort: Pulmonary effort is normal. No respiratory distress.     Breath sounds: Normal breath sounds. No wheezing or rales.  Abdominal:     General: Bowel sounds are normal. There is no distension.     Palpations: Abdomen is soft.     Tenderness: There is no abdominal tenderness.  Musculoskeletal:     Cervical back: Neck supple. Protracted posture    Comments: Limited participation in movement today, squeeze hand: L grip/biceps 5-/5 BUE: 3/5 grip/biceps.  Would not  participate in RLE and LLE movement. Skin:    General: Skin is warm and dry.  Neurological:     Mental Status: She is alert.     Comments: Squeezes hands on command, did protrude tongue on command today, made attempt to smile when asked. Minimal verbal output Was able to remain awake and alert during exam today Ambulating ModA with RW. +echolalia Psychiatric:     Comments: Smiling,  +echolalia GU: continent of bladder at times, incontinent of bowel  Assessment/Plan: 1. Functional deficits which require 3+ hours per day of interdisciplinary therapy in a comprehensive inpatient rehab setting. Physiatrist is providing close team supervision and 24 hour management of active medical problems listed below. Physiatrist and rehab team continue to assess barriers to discharge/monitor patient progress toward functional and medical goals  Care Tool:  Bathing    Body parts bathed by patient: Left lower leg, Right lower leg, Left upper leg, Right upper leg, Front perineal area   Body parts bathed by helper: Right arm, Left arm,  Chest, Abdomen, Buttocks, Face Body parts n/a: Front perineal area, Buttocks   Bathing assist Assist Level: Total Assistance - Patient < 25%     Upper Body Dressing/Undressing Upper body dressing   What is the patient wearing?: Pull over shirt    Upper body assist Assist Level: Total Assistance - Patient < 25%    Lower Body Dressing/Undressing Lower body dressing      What is the patient wearing?: Incontinence brief, Pants     Lower body assist Assist for lower body dressing: Maximal Assistance - Patient 25 - 49%     Toileting Toileting    Toileting assist Assist for toileting: Total Assistance - Patient < 25%     Transfers Chair/bed transfer  Transfers assist     Chair/bed transfer assist level: Maximal Assistance - Patient 25 - 49%     Locomotion Ambulation   Ambulation assist   Ambulation activity did not occur: Safety/medical  concerns  Assist level: 2 helpers Assistive device: Walker-rolling Max distance: 62ft   Walk 10 feet activity   Assist  Walk 10 feet activity did not occur: Safety/medical concerns  Assist level: 2 helpers Assistive device: Walker-rolling   Walk 50 feet activity   Assist Walk 50 feet with 2 turns activity did not occur: Safety/medical concerns         Walk 150 feet activity   Assist Walk 150 feet activity did not occur: Safety/medical concerns         Walk 10 feet on uneven surface  activity   Assist Walk 10 feet on uneven surfaces activity did not occur: Safety/medical concerns         Wheelchair     Assist Is the patient using a wheelchair?: No   Wheelchair activity did not occur: Safety/medical concerns         Wheelchair 50 feet with 2 turns activity    Assist    Wheelchair 50 feet with 2 turns activity did not occur: Safety/medical concerns       Wheelchair 150 feet activity     Assist  Wheelchair 150 feet activity did not occur: Safety/medical concerns       Blood pressure (!) 170/83, pulse (!) 52, temperature 97.6 F (36.4 C), temperature source Oral, resp. rate 18, height 5\' 5"  (1.651 m), weight 55.8 kg, SpO2 100 %.    Medical Problem List and Plan: 1. 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             -patient may  shower             -ELOS/Goals: expected dc 6/7 min A to mod A and Max a for auditory comprehension and Total A for verbal comprehension  -Continue CIR PT, OT,SLP 2.  Anemia: continue Heparin [place] TID. Transfused 1U on 5/15. Post-transfusion hemoglobin trending upward nicely, repeat Monday              -antiplatelet therapy: aspirin 81 mg daily 3. Pain Management: Tylenol prn 4. Mood: LCSW to evaluate and provide emotional support             -antipsychotic agents: n/a 5. Neuropsych: This patient is not capable of making decisions on her own  behalf. 6. Skin/Wound Care: Routine skin care checks 7. Dysphagia             -- continue Dysphagia 1 diet with thin liquids.  Crushed meds with applesauce.  SLP evaluation. MBS tomorrow             --  Continue nutritional supplements, B12, MVI  -5/22 Start normal saline, she is not drinking much fluid PO 8: Paroxysmal atrial fibrillation: continue Lopressor 50 mg BID --home torsemide on hold --no AC due to small bowel AVMs Add magnesium gluconate 250mg  HS    01/17/2022    4:02 AM 01/16/2022    7:45 PM 01/16/2022    2:06 PM  Vitals with BMI  Systolic [phone]  Diastolic 83 63 63  Pulse 52 102 94    9: Diabetes mellitus: CBGs qAC,qHS; monitor intake and activity --continue SSI --increase glipizide back to 10mg  (home dose) Increase Semglee to 11U started HS D/c Ensure between meals 10: Hypertension: d/c Lisinopril due to worsening kidney function, continue amlodipine 10 mg, Lopressor. Repeat creatinine on Monday given increase in Lisinopril.  5/13 BP controlled today. 5/14 some variability in B/P, with mild elevation, some episodes of bradycardia (HR 46 at 051 today). -D/c lisinopril today since BUN 28->68 and Crt 2.28->3.20, with increase in past 2 days.  Hold lopressor with bradycardia episodes.  Will see how B/P trends on amlodipine 10 mg, may consider adding hydralazine if B/p is elevated.   -now hypotensive, change fluids to NS HS 12 hours -5/22 BP a little stable currently, will continue to monitor      01/17/2022    4:02 AM 01/16/2022    7:45 PM 01/16/2022    2:06 PM  Vitals with BMI  Systolic [phone]  Diastolic 83 63 63  Pulse 52 102 94   11: Hyperlipidemia: continue rosuvastatin 10 mg 12: Moderate malnutrition: continue supplements 13: Chronic kidney disease stage 3b: follow-up BMP     -5/13 B/P is in the 120's today. Pt had lisinopril increased to 40 mg yesterday for elevated B/P.  Since lisinopril may increase Crt, decrease lisinopril to 20 mg daily today and  push po intake and repeat BMP. -5/14 Crt up to 3.2 and BUN up to 68.  Fluid bolus (1L NS) and continuous fluid (0.45 NS at 75 ml/hr) given for rise in BUN and Crt. -d/c lisinopril (per Dr. Christella Noa) since could be a possible cause of elevated Crt . Cr elevated, nephrology consulted. Urine studies obtained, creatinine improving, continue D5W     Latest Ref Rng & Units 01/17/2022    5:46 AM 01/16/2022    7:47 PM 01/16/2022    3:28 PM  BMP  Glucose 70 - 99 mg/dL 86   347   371    BUN 8 - 23 mg/dL 69   80   77    Creatinine 0.44 - 1.00 mg/dL 3.21   3.60   3.69    Sodium 135 - 145 mmol/L 156   153   155    Potassium 3.5 - 5.1 mmol/L 4.4   5.0   4.5    Chloride 98 - 111 mmol/L 129   129   127    CO2 22 - 32 mmol/L 20   17   18     Calcium 8.9 - 10.3 mg/dL 8.3   7.9   8.4      14: Glaucoma: continue latanoprost drops 15: Hypothyroidism: continue Synthroid 16: Diarrhea/loose stools: monitor; getting loperamide 2 mg daily (BID prn) -5/13 Continue current regimen. 17: Bowel and bladder incontinence: start bowel and bladder program 18. Daytime somnolence: [employer] and SW if therapy can be scheduled a little later in the day and if daughters can be present to help motivate her 85. Bradycardia: discontinue lopressor.  20. History  of GI bleed: start protonix.  21. Hypotension: resolved, hemoglobin reviewed and stable 22. Neurogenic bladder: bladder program ordered starting 5/11. 23. Stool incontinence: fiber added to bulk stool 24. Screening for vitamin D deficiency: check vitamin D level on Monday 25. Fatigue:   -5/22 Iron 68, HGB slightly improved to 9.9 26. Elevated chloride: notified nephrology 27. Hypernatremia Na 156: Nephrology consulted. Decrease D5W to 142ml/hr   LOS: 14 days A FACE TO FACE EVALUATION WAS PERFORMED  Patricia Dudley 01/17/2022, 10:35 AM

## 2022-01-17 NOTE — Progress Notes (Addendum)
[facility] Progress Note   88 y.o. female PAF, AV malformation of the duodenum, hypothyroidism, DM, HTN, HLD, CKD 3b presenting with aphasia, left gaze and slurred speech but outside the window for lytics. CT / MRI showed left basal ganglia/ internal capsule infarct. Also treated for aspiration PNA with Rocephin and Azithromycin -> Augmentin. Patient on a Dysphagia 1 diet initially and then eventually now to a pureed diet and she also requires assistance to eat.   Assessment/ Plan:   Hypernatremia - considered chronic at this point and will not want to correct to quickly given risk of cerebral edema. Hopefully we can correct in the next 48-72 hrs. Her free water deficit is 4L and goal is to correct to ~151-154 / 24hrs. Will be difficult to calculate free water clearance if she's in a diaper; not convinced she needs a foley just to calculate a free water clearance. - 1/2NS decr to now 37ml/hr yesterday evening with drop in Na from 160-> 153. Labs not back this morning but goal over next 24 hrs is ~145 by tomorrow am. Will check a Na at 12p and 8pm.   Update: increasing 1/2NS back to 100 ml/hr  Continue 8oz water (thickened) q2-4hr. Acute on CKD 3b/IV - acute component likely from prerenal azotemia given the hypernatremia but will check urine studies. Afib on lopressor for rate control, unable to anticoagulate because of symptomatic AVM's in the past. Anemia - transfuse as needed.  S/p CVA HTN  DM Hypothyroidism on Synthroid.  Subjective:   Tolerating rehab; no events overnight other than having to switch to serially decr 1/2NS down to now 32ml/hr. Alert, but confused. Tries to follow commands.   Objective:   BP (!) 170/83 (BP Location: Left Arm)   Pulse (!) 52   Temp 97.6 F (36.4 C) (Oral)   Resp 18   Ht 5\' 5"  (1.651 m)   Wt 55.8 kg   SpO2 100%   BMI 20.47 kg/m   Intake/Output Summary (Last 24 hours) at 01/17/2022 1245 Last data filed at 01/16/2022 1753 Gross per  24 hour  Intake 1020 ml  Output --  Net 1020 ml   Weight change:    Physical Exam: GEN: NAD, alert and cooperative, NCAT HEENT: No conjunctival pallor, EOMI NECK: Supple, no thyromegaly LUNGS: CTA B/L no rales but poor effort, rate is normal CV: irreg ABD: SNDNT +BS  EXT: No lower extremity edema SKIN: poor turgor  Imaging: No results found.  Labs: BMET Recent Labs  Lab 01/15/22 0539 01/15/22 2014 01/16/22 0550 01/16/22 1528 01/16/22 1947  NA 159* 157* 160* 155* 153*  K 5.1 5.0 4.7 4.5 5.0  CL >130* >130* >130* 127* 129*  CO2 22 20* 19* 18* 17*  GLUCOSE 143* 368* 98 371* 347*  BUN 75* 82* 75* 77* 80*  CREATININE 3.52* 3.71* 3.47* 3.69* 3.60*  CALCIUM 8.7* 8.4* 8.7* 8.4* 7.9*   CBC Recent Labs  Lab 01/14/22 0618  WBC 8.5  HGB 9.9*  HCT 31.2*  MCV 88.4  PLT 521*    Medications:     amLODipine  10 mg Oral Daily   aspirin EC  81 mg Oral Daily   Gerhardt's butt cream   Topical QID   glipiZIDE  10 mg Oral QAC breakfast   heparin  5,000 Units Subcutaneous Q8H   insulin aspart  0-5 Units Subcutaneous QHS   insulin aspart  0-9 Units Subcutaneous TID WC   insulin glargine-yfgn  11 Units Subcutaneous QHS   latanoprost  1 drop Both Eyes QHS   levothyroxine  112 mcg Oral Q0600   magnesium gluconate  250 mg Oral QHS   multivitamin with minerals  1 tablet Oral Daily   nystatin  5 mL Oral QID   pantoprazole  40 mg Oral Daily   polycarbophil  625 mg Oral Daily   vitamin B-12  100 mcg Oral Daily      Otelia Santee, MD 01/17/2022, 7:04 AM

## 2022-01-17 NOTE — Progress Notes (Signed)
Physical Therapy Session Note  Patient Details  Name: MASEN SALVAS MRN: [id number] Date of Birth: [birth year]  Today's Date: 01/17/2022 PT Individual Time:1130-1200   30 min   Short Term Goals:  Week 2:  PT Short Term Goal 1 (Week 2): pt will transfer bed<>chair with LRAD and mod A of 1 consistantly PT Short Term Goal 2 (Week 2): pt will transfer sit<>stand with LRAD and mod A of 1 consistantly PT Short Term Goal 3 (Week 2): pt will ambulate 83f with LRAD and mod A of 1   Skilled Therapeutic Interventions/Progress Updates:   Pt received sitting in WC and agreeable to PT. [facility]<>stand x 4 with mod assist overall with delayed initiation. Gait training with RW 2 x 279fwith min-mod assist for balance, continued anterior progression and AD management in turns. [facility] and all needs met.         Therapy Documentation Precautions:  Precautions Precautions: Fall Precaution Comments: aspiration precautions Restrictions Weight Bearing Restrictions: No  Pain:  denies    Therapy/Group: Individual Therapy  AuLorie Phenix/25/2023, 8:29 AM

## 2022-01-17 NOTE — Progress Notes (Signed)
Inpatient Diabetes Program Recommendations  AACE/ADA: New Consensus Statement on Inpatient Glycemic Control (2015)  Target Ranges:  Prepandial:   less than 140 mg/dL      Peak postprandial:   less than 180 mg/dL (1-2 hours)      Critically ill patients:  140 - 180 mg/dL   Lab Results  Component Value Date   GLUCAP 174 (H) 01/17/2022   HGBA1C 6.5 (H) 12/25/2021    Review of Glycemic Control  Latest Reference Range & Units 01/16/22 16:51 01/16/22 20:40 01/17/22 05:08 01/17/22 11:34  Glucose-Capillary 70 - 99 mg/dL 324 (H) 283 (H) 80 174 (H)  Diabetes history: DM 2 Outpatient Diabetes medications:  Glucotrol 10 mg daily Current orders for Inpatient glycemic control:  Novolog sensitive tid with meals and HS Glucotrol 10 mg daily Semglee 11 units q HS Inpatient Diabetes Program Recommendations:     May consider adding Novolog meal coverage 2 units tid with meals (hold if patient eats less than 50% or NPO).   Thanks, Adah Perl, RN, BC-ADM Inpatient Diabetes Coordinator Pager [phone]  (8a-5p)

## 2022-01-17 NOTE — Progress Notes (Signed)
Occupational Therapy Session Note  Patient Details  Name: Patricia Dudley MRN: [id number] Date of Birth: [birth year]  Today's Date: 01/17/2022 OT Individual Time: 9702-6378 OT Individual Time Calculation (min): 70 min    Short Term Goals: Week 2:  OT Short Term Goal 1 (Week 2): Pt will thread LB clothing over both feet with no more than CGA for balance and multimodal cueing as needed OT Short Term Goal 2 (Week 2): Pt will tolerate 2+ mins in stance during ADL task with no more than min A for balance OT Short Term Goal 3 (Week 2): Pt will attend to ADL task with min A for at least 2 mins to promote functional participation and sustained attention  Skilled Therapeutic Interventions/Progress Updates:    Patient received up in wheelchair.  Skilled OT intervention with emphasis on improving attention and active participation in familiar, functional activities.  Patient did well with backward chaining to help her actively participate once activity started.  Patient actively stay engaged in tasks x 30 sec- 1 min.  At times needed assistance to terminate task and begin new activity.  Patient transitioned sit to stand with min assist when functionally relevant - e.g. to pull up pants - used bed rail and arm rest of wheelchair as points of contact to set up this transition.   Worked on sit to stand in dayroom against wall, then worked on sidestepping to " clean the rail" x 5 min.    Therapy Documentation Precautions:  Precautions Precautions: Fall Precaution Comments: aspiration precautions Restrictions Weight Bearing Restrictions: No   Pain:  No indication of pain     Therapy/Group: Individual Therapy  [name] 01/17/2022, 12:35 PM

## 2022-01-17 NOTE — Progress Notes (Signed)
Occupational Therapy Session Note  Patient Details  Name: Patricia Dudley MRN: [id number] Date of Birth: [birth year]  Today's Date: 01/17/2022 OT Individual Time: 7001-7494 OT Individual Time Calculation (min): 27 min    Short Term Goals: Week 1:  OT Short Term Goal 1 (Week 1): Pt will visually attend to R side of body with min A during functional task OT Short Term Goal 1 - Progress (Week 1): Met OT Short Term Goal 2 (Week 1): Pt will follow 1 step command in prep for ADL task, with use of multimodal cues and no more than min A OT Short Term Goal 2 - Progress (Week 1): Met OT Short Term Goal 3 (Week 1): Pt will roll either R or L during bed level toileting, with mod A and use of multimodal cues as needed OT Short Term Goal 3 - Progress (Week 1): Met Week 2:  OT Short Term Goal 1 (Week 2): Pt will thread LB clothing over both feet with no more than CGA for balance and multimodal cueing as needed OT Short Term Goal 2 (Week 2): Pt will tolerate 2+ mins in stance during ADL task with no more than min A for balance OT Short Term Goal 3 (Week 2): Pt will attend to ADL task with min A for at least 2 mins to promote functional participation and sustained attention  Skilled Therapeutic Interventions/Progress Updates:    Pt received seated in TIS with family present, no s/sx of pain and appears, agreeable to therapy. Did not verbalize throughout session but did smile towards daughter who had come to visit. Dep TIS transport to and from gym.  In kitchen seated at kitchen table, pt practiced organizing silverware into organizer to target functional use of RUE, motor planning, sustained attention to familiar task. Pt unable to reach over to table to pick up utensils when cued, but able to grasp/release silverware into organizer with handed over utensil. ~80% accuracy when matching up utensils on first attempt and pt able to sustain attention to task for ~10 min before losing visual attention.  Pt left  seated in TIS with family present with safety belt alarm engaged, call bell in reach, and all immediate needs met.    Therapy Documentation Precautions:  Precautions Precautions: Fall Precaution Comments: aspiration precautions Restrictions Weight Bearing Restrictions: No  Pain:   No s/sx throughout ADL: See Care Tool for more details.  Therapy/Group: Individual Therapy  Volanda Napoleon MS, OTR/L  01/17/2022, 6:57 AM

## 2022-01-18 ENCOUNTER — Inpatient Hospital Stay (HOSPITAL_COMMUNITY): Payer: Medicare PPO

## 2022-01-18 LAB — BASIC METABOLIC PANEL
Anion gap: 5 (ref 5–15)
BUN: 62 mg/dL — ABNORMAL HIGH (ref 8–23)
CO2: 21 mmol/L — ABNORMAL LOW (ref 22–32)
Calcium: 8.1 mg/dL — ABNORMAL LOW (ref 8.9–10.3)
Chloride: 124 mmol/L — ABNORMAL HIGH (ref 98–111)
Creatinine, Ser: 3.1 mg/dL — ABNORMAL HIGH (ref 0.44–1.00)
GFR, Estimated: 14 mL/min — ABNORMAL LOW (ref >60)
Glucose, Bld: 73 mg/dL (ref 70–99)
Potassium: 4.1 mmol/L (ref 3.5–5.1)
Sodium: 150 mmol/L — ABNORMAL HIGH (ref 135–145)

## 2022-01-18 LAB — GLUCOSE, CAPILLARY
Glucose-Capillary: 56 mg/dL — ABNORMAL LOW (ref 70–99)
Glucose-Capillary: 65 mg/dL — ABNORMAL LOW (ref 70–99)
Glucose-Capillary: 71 mg/dL (ref 70–99)
Glucose-Capillary: 114 mg/dL — ABNORMAL HIGH (ref 70–99)
Glucose-Capillary: 116 mg/dL — ABNORMAL HIGH (ref 70–99)
Glucose-Capillary: 276 mg/dL — ABNORMAL HIGH (ref 70–99)
Glucose-Capillary: 118 mg/dL — ABNORMAL HIGH (ref 70–99)

## 2022-01-18 MED ORDER — GLUCOSE 40 % PO GEL
ORAL | Status: AC
  Filled 2022-01-18: qty 1.21

## 2022-01-18 MED ORDER — GLIPIZIDE 5 MG PO TABS
10.0000 mg | ORAL_TABLET | Freq: Every day | ORAL | Status: DC
  Administered 2022-01-18 – 2022-01-30 (×13): 10 mg via ORAL
  Filled 2022-01-18 (×12): qty 2

## 2022-01-18 MED ORDER — SORBITOL 70 % SOLN: 30.0000 mL | Freq: Every day | Status: DC | PRN

## 2022-01-18 MED ORDER — DEXTROSE 5 % IV SOLN: INTRAVENOUS | Status: DC

## 2022-01-18 NOTE — Progress Notes (Addendum)
Occupational Therapy Session Note  Patient Details  Name: Patricia Dudley MRN: [id number] Date of Birth: [birth year]  Today's Date: 01/18/2022 OT Individual Time: 1104-1200 & 5643-3295 OT Individual Time Calculation (min): 56 min & 53 min   Short Term Goals: Week 2:  OT Short Term Goal 1 (Week 2): Pt will thread LB clothing over both feet with no more than CGA for balance and multimodal cueing as needed OT Short Term Goal 2 (Week 2): Pt will tolerate 2+ mins in stance during ADL task with no more than min A for balance OT Short Term Goal 3 (Week 2): Pt will attend to ADL task with min A for at least 2 mins to promote functional participation and sustained attention  Skilled Therapeutic Interventions/Progress Updates:  Session 1 Skilled OT intervention completed with focus on motor planning, one step command following within functional context. Pt received in L side lying, asleep requiring multimodal cues to waken, including removing covers with pt then arousing and eye opening eyes. Pt was increasingly verbal this session, however expressing short statements including "hi", "thank you", "tired" and "goodbye". No signs of pain or distress visible with facial expressions despite non-verbal report of pain. Pt smiling and laughing on occasion during session as well. Improvement in processing speed this session demonstrated with pt following one step commands about 50% of the time without repetition!  Pt found to be incontinent (bladder and bowel- NT aware) as well as actively going in brief, requiring bed level pericare and brief changing with max A, with pt assisting with rolling R<> L with mod A requiring multimodal cues, pericare with total A and threading new brief. Transitioned R sidelying > EOB with max A for BLE and trunk control however fading to close supervision while all extremities supported, and min A during LB dressing. Donned shirt with mod A with forward chaining method,  self-threading BUEs in arm holes after over head, then mod A LB at the sit > stand level with min A needed for standing using RW, then mod A for donning over R hip with pt actively pulling up on L. Few step transfer using RW with mod A to w/c, with total A needed for placing hips into chair as pt attempting to sit prior to safe placement. Donned shoes with mod A with use of figure 4 position on R, and initiation with placing feet into shoes on floor.   Pt actively engaged in stripping/placing bed sheets with max cueing, and hand over hand assist, but most independence and initiation noted during threading of pillow case on pillow with only min A needed for 2 pillows. Pt was left upright in TIS chair, with belt alarm on and all needs in reach at end of session.    Session 2 Skilled OT intervention completed with focus on toileting, functional transfers, family education. Pt received seated in TIS chair with family present. No verbal pain reported, and no signs of pain on pt's face. Pt was minimally verbal during session with word salad, expressing intelligible words at times. Suspect that pt is repeating therapist vs responding in comprehension.   Provided overview education about CLOF, assist level and goals for d/c with pt's daughter [place]. However palliative consult entered room therefore could not complete hands on training. Transported pt dependently to bathroom for toileting as pt able to express "yes" to if she needed to go to bathroom. Completed sit > stand with min A at grab bars, then mod A stand pivot with +2  on standby for safety however pt able to pivot with HHA and grab bar. Guidance needed for hips to [facility]. Incontinent BM found in brief, however continent BM completed in toilet. Total A for pericare and donning of new brief/pants. Completed stand pivot with mod A at same assist back to chair. Stand pivot using RW with mod A with physical assist needed for positioning RW during transfer to EOB.  Pt resistant at first to lay down in bed, however with increased time for clear response pt reported "need help" with therapist completing total A bed mobility, then max A for boosting up towards [place]. Pt was left upright in bed, with family present, bed alarm on and all needs in reach at end of session.  Therapy Documentation Precautions:  Precautions Precautions: Fall Precaution Comments: aspiration precautions Restrictions Weight Bearing Restrictions: No    Therapy/Group: Individual Therapy  Chelsie Burel E Craigory Toste 01/18/2022, 7:44 AM

## 2022-01-18 NOTE — Progress Notes (Signed)
Physical Therapy Session Note  Patient Details  Name: Patricia Dudley MRN: [id number] Date of Birth: Jan 29, 1930  Today's Date: 01/18/2022 PT Individual Time: 4917-9150 and 1300-1355 PT Individual Time Calculation (min): 39 min and 55 min  Short Term Goals: Week 2:  PT Short Term Goal 1 (Week 2): pt will transfer bed<>chair with LRAD and mod A of 1 consistantly PT Short Term Goal 1 - Progress (Week 2): Met PT Short Term Goal 2 (Week 2): pt will transfer sit<>stand with LRAD and mod A of 1 consistantly PT Short Term Goal 2 - Progress (Week 2): Progressing toward goal PT Short Term Goal 3 (Week 2): pt will ambulate 37f with LRAD and mod A of 1 PT Short Term Goal 3 - Progress (Week 2): Met Week 3:  PT Short Term Goal 1 (Week 3): pt will initate supine<>sit with mod A PT Short Term Goal 2 (Week 3): pt will transfer sit<>stand with mod A consistantly PT Short Term Goal 3 (Week 3): pt will ambulate 257fwith LRAD and mod A  Skilled Therapeutic Interventions/Progress Updates:  Treatment Session 1 Received pt sidelying in bed asleep. Per RN, pt being taken for MBS sometime during therapy session and requesting pt be back in bed at end of session. Pt did not arouse to max verbal/tactile stimuli; therefore removed bedsheets and pt immediately awoke. Pt transferred semi-reclined<>sitting EOB with HOB elevated and total A this morning as pt resisting therapist to sit up. Scooted to EOB with max A and donned slippers with max A. Pt able to maintain static sitting balance with close supervision once reaching EOB with 1 posterior LOB requiring mod/max A to correct. Worked on blocked practice sit<>stands x 3 trials (successful 2/3 attempts) with RW and mod/max A of 1 - max hand over hand cues for technique. Pt unable to sequence side steps to [facility] immediately returned to sitting. RN arrived to administer medication and worked on dynamic sitting balance while taking medications (required significantly  increased time but did not appear to be swallowing). MD arrived for morning rounds. Transport arrived and pt transferred sit<>supine with max A and required +2 assist to [facility] with pt semi-reclined in bed with all needs within reach [facility]  Treatment Session 2 [facility] with feeding. Session with emphasis on family education training, functional mobility/transfers, generalized strengthening and endurance, attention/initiation, dynamic standing balance/coordination, and gait training. Educated daughter on how to perform oral care as well as importance of performing after every meal - pt much more participative, opening mouth with minimal cues. Of note, pt much more alert and communicative today, speaking in short phrases and smiling to therapist. NT arrived to check vitals and pt transported to/from room in TIS WC dependently. Sit<>stand with RW and min A of 1, with manual facilitation to place UEs on RW, and pt ambulated 1658f 2 trials (trial 1 with therapist and trial 2 with daughter) with RW and min A with +2 for equipment management. Pt able to advance RLE without assist today as well as steer RW with only occasional assist for steering! Asked if pt was fatigued and she stated "a little bit". When asked if pt wanted to try walking again with daughter, she responded with "okay". Pt ambulates with significantly decreased cadence, narrow BOS, and decreased foot clearance R>L. Concluded session with pt sitting in TIS WC, needs within reach, and seatbelt alarm on with family  present at bedside. Upon leaving pt was able to state "bye" to therapist.   Therapy Documentation Precautions:  Precautions Precautions: Fall Precaution Comments: aspiration precautions Restrictions Weight Bearing Restrictions: No  Therapy/Group: Individual Therapy Alfonse Alpers PT, DPT  01/18/2022, 7:02 AM

## 2022-01-18 NOTE — Progress Notes (Signed)
PROGRESS NOTE   Subjective/Complaints: MBS today showed aspiration on thin liquids- consulted palliative care for goals of care discussion regarding feeding preferences- for now will continue thickened liquids.   ROS: Limited by language, unable to answer commands.  Objective:   No results found. No results for input(s): WBC, HGB, HCT, PLT in the last 72 hours.   Recent Labs    01/17/22 1936 01/18/22 0538  NA 151* 150*  K 4.6 4.1  CL 125* 124*  CO2 19* 21*  GLUCOSE 321* 73  BUN 72* 62*  CREATININE 3.38* 3.10*  CALCIUM 7.9* 8.1*     Intake/Output Summary (Last 24 hours) at 01/18/2022 1202 Last data filed at 01/18/2022 0600 Gross per 24 hour  Intake 4595.74 ml  Output --  Net 4595.74 ml        Physical Exam: Vital Signs Blood pressure (!) 145/54, pulse 92, temperature 98 F (36.7 C), resp. rate 16, height 5\' 5"  (1.651 m), weight 55.8 kg, SpO2 100 %.  Gen: no distress, normal appearing, BMI 20.47, fatigued HENT: oral mucosa pink and moist, NCAT, requires assistance to eat Eyes:     General:    In bed, covers mouth with blanket    Right eye: No discharge.        Left eye: No discharge.     Comments: Left lid closed R eye lid more open  Mouth: denitrues in place, Mucus membranes a little dry Cardiovascular:     Rate and Rhythm: irregular, a fib, had episodes of bradycardia    Heart sounds: Normal heart sounds. No murmur heard.   No gallop.  Pulmonary:     Effort: Pulmonary effort is normal. No respiratory distress.     Breath sounds: Normal breath sounds. No wheezing or rales.  Abdominal:     General: Bowel sounds are normal. There is no distension.     Palpations: Abdomen is soft.     Tenderness: There is no abdominal tenderness.  Musculoskeletal:     Cervical back: Neck supple. Protracted posture    Comments: Limited participation in movement today, squeeze hand: L grip/biceps 5-/5 BUE: 3/5  grip/biceps.  Would not participate in RLE and LLE movement. Skin:    General: Skin is warm and dry.  Neurological:     Mental Status: She is alert.     Comments: Squeezes hands on command, did protrude tongue on command today, made attempt to smile when asked. Minimal verbal output Was able to remain awake and alert during exam today Ambulating ModA with RW. Sit to stand MinA +echolalia Psychiatric:     Comments: Smiling,  +echolalia GU: continent of bladder at times, incontinent of bowel  Assessment/Plan: 1. Functional deficits which require 3+ hours per day of interdisciplinary therapy in a comprehensive inpatient rehab setting. Physiatrist is providing close team supervision and 24 hour management of active medical problems listed below. Physiatrist and rehab team continue to assess barriers to discharge/monitor patient progress toward functional and medical goals  Care Tool:  Bathing    Body parts bathed by patient: Right arm, Left arm, Chest, Abdomen, Left upper leg, Right lower leg   Body parts bathed by helper: Left lower  leg, Right lower leg Body parts n/a: Front perineal area, Buttocks   Bathing assist Assist Level: Maximal Assistance - Patient 24 - 49%     Upper Body Dressing/Undressing Upper body dressing   What is the patient wearing?: Pull over shirt    Upper body assist Assist Level: Maximal Assistance - Patient 25 - 49%    Lower Body Dressing/Undressing Lower body dressing      What is the patient wearing?: Incontinence brief, Pants     Lower body assist Assist for lower body dressing: Maximal Assistance - Patient 25 - 49%     Toileting Toileting    Toileting assist Assist for toileting: Total Assistance - Patient < 25%     Transfers Chair/bed transfer  Transfers assist     Chair/bed transfer assist level: Maximal Assistance - Patient 25 - 49%     Locomotion Ambulation   Ambulation assist   Ambulation activity did not occur:  Safety/medical concerns  Assist level: 2 helpers Assistive device: Walker-rolling Max distance: 51ft   Walk 10 feet activity   Assist  Walk 10 feet activity did not occur: Safety/medical concerns  Assist level: 2 helpers Assistive device: Walker-rolling   Walk 50 feet activity   Assist Walk 50 feet with 2 turns activity did not occur: Safety/medical concerns         Walk 150 feet activity   Assist Walk 150 feet activity did not occur: Safety/medical concerns         Walk 10 feet on uneven surface  activity   Assist Walk 10 feet on uneven surfaces activity did not occur: Safety/medical concerns         Wheelchair     Assist Is the patient using a wheelchair?: No   Wheelchair activity did not occur: Safety/medical concerns         Wheelchair 50 feet with 2 turns activity    Assist    Wheelchair 50 feet with 2 turns activity did not occur: Safety/medical concerns       Wheelchair 150 feet activity     Assist  Wheelchair 150 feet activity did not occur: Safety/medical concerns       Blood pressure (!) 145/54, pulse 92, temperature 98 F (36.7 C), resp. rate 16, height 5\' 5"  (1.651 m), weight 55.8 kg, SpO2 100 %.    Medical Problem List and Plan: 1. 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             -patient may  shower             -ELOS/Goals: expected dc 6/7 min A to mod A and Max a for auditory comprehension and Total A for verbal comprehension  -Continue CIR PT, OT,SLP 2.  Anemia: continue Heparin  TID. Transfused 1U on 5/15. Post-transfusion hemoglobin trending upward nicely, repeat Monday              -antiplatelet therapy: aspirin 81 mg daily 3. Pain Management: Tylenol prn 4. Mood: LCSW to evaluate and provide emotional support             -antipsychotic agents: n/a 5. Neuropsych: This patient is not capable of making decisions on her own behalf. 6.  Skin/Wound Care: Routine skin care checks 7. Dysphagia             -- continue Dysphagia 1 diet with thin liquids.  Crushed meds with applesauce.  SLP evaluation. MBS discussed with SLP and shows high  risk of aspiration with thin liquids.              -- Continue nutritional supplements, B12, MVI  -5/22 Start normal saline, she is not drinking much fluid PO 8: Paroxysmal atrial fibrillation: continue Lopressor 50 mg BID --home torsemide on hold --no AC due to small bowel AVMs Add magnesium gluconate 250mg  HS    01/18/2022    5:20 AM 01/17/2022    7:00 PM 01/17/2022    2:44 PM  Vitals with BMI  Systolic 093 235 573  Diastolic 54 88 54  Pulse 92 96 89    9: Diabetes mellitus: CBGs qAC,qHS; monitor intake and activity --continue SSI --increase glipizide back to 10mg  (home dose)- changed timing to after supper due to AM hypoglycemia and elevated CBGs HS Increase Semglee to 11U started HS D/c Ensure between meals D/c HS sliding scale due to AM hypoglycemia.  10: Hypertension: d/c Lisinopril due to worsening kidney function, continue amlodipine 10 mg, Lopressor. Repeat creatinine on Monday given increase in Lisinopril.  5/13 BP controlled today. 5/14 some variability in B/P, with mild elevation, some episodes of bradycardia (HR 46 at 051 today). -D/c lisinopril today since BUN 28->68 and Crt 2.28->3.20, with increase in past 2 days.  Hold lopressor with bradycardia episodes.  Will see how B/P trends on amlodipine 10 mg, may consider adding hydralazine if B/p is elevated.   -now hypotensive, change fluids to NS HS 12 hours -5/22 BP a little stable currently, will continue to monitor      01/18/2022    5:20 AM 01/17/2022    7:00 PM 01/17/2022    2:44 PM  Vitals with BMI  Systolic 220 254 270  Diastolic 54 88 54  Pulse 92 96 89   11: Hyperlipidemia: continue rosuvastatin 10 mg 12: Moderate malnutrition: continue supplements 13: Chronic kidney disease stage 3b: follow-up BMP     -5/13  B/P is in the 120's today. Pt had lisinopril increased to 40 mg yesterday for elevated B/P.  Since lisinopril may increase Crt, decrease lisinopril to 20 mg daily today and push po intake and repeat BMP. -5/14 Crt up to 3.2 and BUN up to 68.  Fluid bolus (1L NS) and continuous fluid (0.45 NS at 75 ml/hr) given for rise in BUN and Crt. -d/c lisinopril (per Dr. Christella Noa) since could be a possible cause of elevated Crt . Cr elevated, nephrology consulted. Urine studies obtained, creatinine improving, continue D5W     Latest Ref Rng & Units 01/18/2022    5:38 AM 01/17/2022    7:36 PM 01/17/2022    1:10 PM  BMP  Glucose 70 - 99 mg/dL 73   321   217    BUN 8 - 23 mg/dL 62   72   66    Creatinine 0.44 - 1.00 mg/dL 3.10   3.38   3.19    Sodium 135 - 145 mmol/L 150   151   154    Potassium 3.5 - 5.1 mmol/L 4.1   4.6   5.3    Chloride 98 - 111 mmol/L 124   125   130    CO2 22 - 32 mmol/L 21   19   18     Calcium 8.9 - 10.3 mg/dL 8.1   7.9   8.4      14: Glaucoma: continue latanoprost drops 15: Hypothyroidism: continue Synthroid 16: Diarrhea/loose stools: monitor; getting loperamide 2 mg daily (BID prn) -5/13 Continue current regimen. 17: Bowel and  bladder incontinence: start bowel and bladder program 18. Daytime somnolence: [employer] and SW if therapy can be scheduled a little later in the day and if daughters can be present to help motivate her 30. Bradycardia: discontinue lopressor.  20. History of GI bleed: start protonix.  21. Hypotension: resolved, hemoglobin reviewed and stable 22. Neurogenic bladder: bladder program ordered starting 5/11. 23. Stool incontinence: fiber added to bulk stool 24. Screening for vitamin D deficiency: check vitamin D level on Monday 25. Fatigue:   -5/22 Iron 68, HGB slightly improved to 9.9 26. Elevated chloride: notified nephrology 27. Hypernatremia Na 156: Nephrology consulted. Decrease D5W to 111ml/hr   LOS: 15 days A FACE TO FACE EVALUATION WAS  PERFORMED  Martha Clan P Itati Brocksmith 01/18/2022, 12:02 PM

## 2022-01-18 NOTE — Progress Notes (Signed)
Occupational Therapy Weekly Progress Note  Patient Details  Name: Patricia Dudley MRN: [id number] Date of Birth: [birth year]  Beginning of progress report period: Jan 11, 2022 End of progress report period: Jan 18, 2022   Patient has met 2 of 3 short term goals.  Pt is making slow progress towards LTGs. Pt has progressed functional transfers from min A +2 for short step ambulatory transfers using RW to overall mod A of 1 however is not consistent with this assist level requiring up to total A at times, is able to bathe at an overall Max A level, dress at an overall mod A level and requires max assist for toileting tasks. Pt continues to be demonstrate inconsistencies with performance, as fatigue and activity tolerance varies day to day, however pt has demonstrated overall improvement on good days with increased processing speed, one step command following, and initiation with functional tasks. Pt still requires multimodal cueing, and is at a max to total A for cues at this time. OT has initiated family education with 4 of 8 daughters, however all members have not completed hands on assist training with OT and would benefit from extensive education to better prepare pt and family for safe discharge home.    Patient continues to demonstrate the following deficits: muscle weakness, decreased cardiorespiratoy endurance, impaired timing and sequencing, decreased coordination, and decreased motor planning, decreased visual acuity, decreased initiation, decreased attention, decreased awareness, decreased problem solving, decreased safety awareness, decreased memory, and delayed processing, and decreased sitting balance, decreased standing balance, and hemiplegia and therefore will continue to benefit from skilled OT intervention to enhance overall performance with BADL and Reduce care partner burden.  Patient progressing toward long term goals..  Continue plan of care.  OT Short Term Goals Week 1:  OT  Short Term Goal 1 (Week 1): Pt will visually attend to R side of body with min A during functional task OT Short Term Goal 1 - Progress (Week 1): Met OT Short Term Goal 2 (Week 1): Pt will follow 1 step command in prep for ADL task, with use of multimodal cues and no more than min A OT Short Term Goal 2 - Progress (Week 1): Met OT Short Term Goal 3 (Week 1): Pt will roll either R or L during bed level toileting, with mod A and use of multimodal cues as needed OT Short Term Goal 3 - Progress (Week 1): Met Week 2:  OT Short Term Goal 1 (Week 2): Pt will thread LB clothing over both feet with no more than CGA for balance and multimodal cueing as needed OT Short Term Goal 1 - Progress (Week 2): Not met OT Short Term Goal 2 (Week 2): Pt will tolerate 2+ mins in stance during ADL task with no more than min A for balance OT Short Term Goal 2 - Progress (Week 2): Met OT Short Term Goal 3 (Week 2): Pt will attend to ADL task with min A for at least 2 mins to promote functional participation and sustained attention OT Short Term Goal 3 - Progress (Week 2): Met Week 3:  OT Short Term Goal 1 (Week 3): Pt will consistently complete 1/3 steps of toileting, with no more than min A for balance to promote independence with OOB toileting OT Short Term Goal 2 (Week 3): Pt will attend to task >10 mins to promote sustained attention needed for ADL task OT Short Term Goal 3 (Week 3): Pt will initiate 2/3 steps with donning shirt, with  no more than min A and multimodal cues PRN    Suprena Travaglini E Alle Difabio 01/18/2022, 7:48 AM

## 2022-01-18 NOTE — Progress Notes (Signed)
Physical Therapy Weekly Progress Note  Patient Details  Name: Patricia Dudley MRN: [id number] Date of Birth: Dec 31, 1929  Beginning of progress report period: Jan 04, 2022 End of progress report period: Jan 18, 2022  Patient has met 2 of 3 short term goals. Pt demonstrates slow progress towards long term goals. Pt currently requires max A for bed mobility due to lack of initiation and poor motor planning. Pt continues to fluctuate with mobility and can range from heavy min of 1 to mod of 2 for transfers with RW. Pt is currently able to ambulate up to 91f with RW and mod A (+2 for equipment management) and navigate 4 steps with 2 railings and max A +2. Pt continues to be limited by global aphasia, R hemiparesis, impaired motor planning/sequencing, decreased initiation/attention, and decreased balance/postural control. Pt's 4 daughters have been present for 1 family education training session, but will require additional eduction sessions prior to discharge.   Patient continues to demonstrate the following deficits muscle weakness, decreased cardiorespiratoy endurance, impaired timing and sequencing, decreased coordination, and decreased motor planning, decreased initiation, decreased attention, decreased awareness, decreased problem solving, decreased safety awareness, and delayed processing, and decreased sitting balance, decreased standing balance, decreased postural control, hemiplegia, and decreased balance strategies and therefore will continue to benefit from skilled PT intervention to increase functional independence with mobility.  Patient progressing toward long term goals..  Continue plan of care.  PT Short Term Goals Week 2:  PT Short Term Goal 1 (Week 2): pt will transfer bed<>chair with LRAD and mod A of 1 consistantly PT Short Term Goal 1 - Progress (Week 2): Met PT Short Term Goal 2 (Week 2): pt will transfer sit<>stand with LRAD and mod A of 1 consistantly PT Short Term Goal 2 -  Progress (Week 2): Progressing toward goal PT Short Term Goal 3 (Week 2): pt will ambulate 158fwith LRAD and mod A of 1 PT Short Term Goal 3 - Progress (Week 2): Met Week 3:  PT Short Term Goal 1 (Week 3): pt will initate supine<>sit with mod A PT Short Term Goal 2 (Week 3): pt will transfer sit<>stand with mod A consistantly PT Short Term Goal 3 (Week 3): pt will ambulate 2033fith LRAD and mod A  Skilled Therapeutic Interventions/Progress Updates:  Ambulation/gait training;Discharge planning;Functional mobility training;Therapeutic Activities;UE/LE Strength taining/ROM;Balance/vestibular training;Community reintegration;Neuromuscular re-education;Patient/family education;Therapeutic Exercise   Therapy Documentation Precautions:  Precautions Precautions: Fall Precaution Comments: aspiration precautions Restrictions Weight Bearing Restrictions: No  Therapy/Group: Individual Therapy AnnAlfonse Alpers, DPT  01/18/2022, 7:00 AM

## 2022-01-18 NOTE — Progress Notes (Deleted)
Consultation Note Date: 01/18/2022   Patient Name: Patricia Dudley  DOB: [birth year]  MRN: [id number]  Age / Sex: 88 y.o., female  PCP: Tracie Harrier, MD Referring Physician: Izora Ribas, MD  Reason for Consultation: Establishing goals of care  HPI/Patient Profile: 88 y.o. 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   PMT has been consulted to assist with goals of care conversation regarding nutrition given continued high risk of aspiration with thin liquids. Diet advanced to dysphagia 3/pured meats with thin liquids on 5/4, but downgraded back to dysphagia 1 diet, crushed meds on 5/5.   Clinical Assessment and Goals of Care:  I have reviewed medical records including EPIC notes, labs and imaging, assessed the patient and then met at the bedside with daughter [place] and called daughter/HCPOA [name] to discuss diagnosis prognosis, GOC, EOL wishes, disposition and options.  I introduced Palliative Medicine as specialized medical care for people living with serious illness. It focuses on providing relief from the symptoms and stress of a serious illness. The goal is to improve quality of life for both the patient and the family.  We discussed a brief life review of the patient and then focused on their current illness.   I attempted to elicit values and goals of care important to the patient.    Medical History Review and Understanding:  Discussed patient's previous stroke around 15 years ago, ongoing management of her chronic comorbidities, and recent stroke with  complicating pneumonia.   Social History: Patient has 10 children who are all involved in her care. 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  Advance Directives: A detailed discussion regarding advanced directives was had. Patient's daughter [name] is HCPOA.   Discussion: Patient's daughter [place] shares that all 10 children are involved in major decisions regarding patient's care.  They are hopeful that she will recover as she has in the past with a previous stroke.  We discussed the risks with ongoing dysphagia and that decisions regarding nutrition may soon become urgent.  I recommended family meeting to discuss in detail sooner rather than later.  She notes that her Sister [name] is power of attorney and is out of town until Wednesday.  I called [name] and provided updates on today's MBS, concerns for dysphagia without improvement, and recommendations for a family meeting to discuss goals of care.  She does not have much time to discuss further but states she will get in touch with her Sister [place] for PMT contact information and reach out when they have discussed preferences for scheduling.   Discussed the importance of continued conversation with family and the medical providers regarding overall plan of care and treatment options, ensuring decisions are within the context of the patient's values and GOCs.   Questions and concerns were addressed.  Hard Choices booklet left for review. The family was encouraged to call with questions or concerns.  PMT will continue to support holistically.    SUMMARY OF RECOMMENDATIONS   -Patient's family will coordinate to schedule preferred date/time for family meeting and [place] discussion regarding nutritional goals -Will follow up on CODE STATUS, per records patient was DNR throughout  previous admission -PMT will continue to follow  Prognosis:  Guarded  Discharge  Planning: To Be Determined      Primary Diagnoses: Present on Admission:  Acute embolic stroke [facility])   Physical Exam Vitals and nursing note reviewed.  Constitutional:      General: She is not in acute distress. Neurological:     Mental Status: She is alert.    Vital Signs: BP 123/83 (BP Location: Left Arm)   Pulse (!) 107   Temp (!) 97.4 F (36.3 C)   Resp 18   Ht 5' 5"  (1.651 m)   Wt 55.8 kg   SpO2 100%   BMI 20.47 kg/m  Pain Scale: Faces   Pain Score: 0-No pain   SpO2: SpO2: 100 % O2 Device:SpO2: 100 % O2 Flow Rate: .    Palliative Assessment/Data:     Total time: I spent 45 minutes in the care of the patient today in the above activities and documenting the encounter.   Aaditya Letizia Johnnette Litter, PA-C  Palliative Medicine Team Team phone # [phone]  Thank you for allowing the Palliative Medicine Team to assist in the care of this patient. Please utilize secure chat with additional questions, if there is no response within 30 minutes please call the above phone number.  Palliative Medicine Team providers are available by phone from 7am to 7pm daily and can be reached through the team cell phone.  Should this patient require assistance outside of these hours, please call the patient's attending physician.

## 2022-01-18 NOTE — Significant Event (Signed)
Hypoglycemic Event  CBG: 56  Treatment: 1 tube glucose gel  Symptoms:     lethargic  Follow-up CBG: Time: 1225 CBG Result:65                                     [id number]                        21   Possible Reasons for Event: Unknown  Comments/MD notified: Linna Hoff, [place] notified      Corneluis Allston M Alana [place]

## 2022-01-18 NOTE — Progress Notes (Addendum)
Modified Barium Swallow Progress Note  Patient Details  Name: Patricia Dudley MRN: [id number] Date of Birth: 12-13-1929  Today's Date: 01/18/2022  Modified Barium Swallow completed.  Full report located under Chart Review in the Imaging Section.  Brief recommendations include the following:  Clinical Impression MBSS completed. Per instrumental findings, pt continues to present with moderate oropharyngeal dysphagia which continues to result from poor motor planning and cognitive impairment. No functional change noted in today's swallow function when compared to MBSS completed on 01/09/2022. Pt continues to demonstrate silent aspiration of thin liquids via tsp and cup sips, as well as an overall elevated risk of development of aspiration related complications (e.g. PNA, illness). Pt inconsistently follows commands with no postural and behavioral strategies appearing beneficial (chin tuck, effortful swallow, and bolus hold). Prognosis for advancing pt's diet without increasing aspiration risk is very guarded at this time. Additionally, pt remains at an elevated risk of malnutrition and dehydration due to need for full assistance for self-feeding and oral care with long-term alternate means of nutrition and hydration placing pt at risk for further medical complications and not necessarily reducing risk of developing complications from aspiration (reflux, secretions). Please see below for diet recommendations and MBSS report for oropharyngeal impairments. Recommend palliative care consult for pt and family to discuss goals of care re: swallow function and if pt would wish for conservative measures to reduce aspiration risk vs comfort/quality of life; pt limited by cognitive impairment at this time.    Swallow Evaluation Recommendations   SLP Diet Recommendations: Dysphagia 1 (Puree) solids;No mixed consistencies;Nectar thick liquid   Liquid Administration via: Cup;Straw   Medication Administration:  Crushed with puree   Compensations: Slow rate;Small sips/bites;Minimize environmental distractions;Other (Comment) (Provide solids separate from liquids; do not alternate)   Postural Changes: Remain semi-upright after after feeds/meals (Comment);Seated upright at 90 degrees   Oral Care Recommendations: Oral care before and after PO;Oral care BID    Mabelle Mungin A Yessika Otte 01/18/2022,2:30 PM

## 2022-01-18 NOTE — Progress Notes (Signed)
Patient ID: Patricia Dudley, female   DOB: 01/09/1930, 88 y.o.   MRN: [id number] [facility] Progress Note   Assessment/ Plan:   1.  Hypernatremia: Appears to be subacute versus chronic following her recent hospitalization/CVA.  Improved overnight with half-normal saline and I will convert fluids now to D5W to provide both glucose and water.  Oral intake limited. 2.  Acute kidney injury on chronic kidney disease stage IIIb-stage IV: Appears hemodynamically mediated and partly improved with half-normal saline.  Continue water replacement. 3.  Atrial fibrillation: Rate controlled on metoprolol and not a candidate for anticoagulation due to symptomatic arteriovenous malformations. 4.  Aphasia/physical deconditioning status post left basal ganglia/internal capsule CVA  Subjective:   Hypoglycemia noted earlier this morning.   Objective:   BP (!) 145/54 (BP Location: Right Arm)   Pulse 92   Temp 98 F (36.7 C)   Resp 16   Ht 5\' 5"  (1.651 m)   Wt 55.8 kg   SpO2 100%   BMI 20.47 kg/m   Intake/Output Summary (Last 24 hours) at 01/18/2022 1156 Last data filed at 01/18/2022 0600 Gross per 24 hour  Intake 4595.74 ml  Output --  Net 4595.74 ml   Weight change:   Physical Exam: Gen: Sitting comfortably in wheelchair, nonverbal but alert CVS: Pulse regular rhythm, normal rate, S1 and S2 are normal Resp: Diminished breath sounds over bases, no distinct rales or rhonchi Abd: Soft, flat, nontender, bowel sounds normal Ext: No lower extremity edema  Imaging: No results found.  Labs: BMET Recent Labs  Lab 01/16/22 0550 01/16/22 1528 01/16/22 1947 01/17/22 0546 01/17/22 1310 01/17/22 1936 01/18/22 0538  NA 160* 155* 153* 156* 154* 151* 150*  K 4.7 4.5 5.0 4.4 5.3* 4.6 4.1  CL >130* 127* 129* 129* 130* 125* 124*  CO2 19* 18* 17* 20* 18* 19* 21*  GLUCOSE 98 371* 347* 86 217* 321* 73  BUN 75* 77* 80* 69* 66* 72* 62*  CREATININE 3.47* 3.69* 3.60* 3.21* 3.19* 3.38*  3.10*  CALCIUM 8.7* 8.4* 7.9* 8.3* 8.4* 7.9* 8.1*   CBC Recent Labs  Lab 01/14/22 0618  WBC 8.5  HGB 9.9*  HCT 31.2*  MCV 88.4  PLT 521*    Medications:     amLODipine  10 mg Oral Daily   aspirin EC  81 mg Oral Daily   Gerhardt's butt cream   Topical QID   glipiZIDE  10 mg Oral QPC supper   heparin  5,000 Units Subcutaneous Q8H   insulin aspart  0-9 Units Subcutaneous TID WC   insulin glargine-yfgn  12 Units Subcutaneous QHS   latanoprost  1 drop Both Eyes QHS   levothyroxine  112 mcg Oral Q0600   magnesium gluconate  250 mg Oral QHS   multivitamin with minerals  1 tablet Oral Daily   nystatin  5 mL Oral QID   pantoprazole  40 mg Oral Daily   polycarbophil  625 mg Oral Daily   vitamin B-12  100 mcg Oral Daily   Elmarie Shiley, MD 01/18/2022, 11:56 AM

## 2022-01-19 LAB — BASIC METABOLIC PANEL
Anion gap: 5 (ref 5–15)
BUN: 47 mg/dL — ABNORMAL HIGH (ref 8–23)
CO2: 21 mmol/L — ABNORMAL LOW (ref 22–32)
Calcium: 8.2 mg/dL — ABNORMAL LOW (ref 8.9–10.3)
Chloride: 119 mmol/L — ABNORMAL HIGH (ref 98–111)
Creatinine, Ser: 2.76 mg/dL — ABNORMAL HIGH (ref 0.44–1.00)
GFR, Estimated: 16 mL/min — ABNORMAL LOW (ref >60)
Glucose, Bld: 92 mg/dL (ref 70–99)
Potassium: 4.2 mmol/L (ref 3.5–5.1)
Sodium: 145 mmol/L (ref 135–145)

## 2022-01-19 LAB — GLUCOSE, CAPILLARY
Glucose-Capillary: 83 mg/dL (ref 70–99)
Glucose-Capillary: 225 mg/dL — ABNORMAL HIGH (ref 70–99)
Glucose-Capillary: 355 mg/dL — ABNORMAL HIGH (ref 70–99)
Glucose-Capillary: 241 mg/dL — ABNORMAL HIGH (ref 70–99)

## 2022-01-19 NOTE — Progress Notes (Signed)
Patient ID: Patricia Dudley, female   DOB: 26-Oct-1929, 88 y.o.   MRN: [id number] [facility] Progress Note   Assessment/ Plan:   1.  Hypernatremia: Appears to be subacute versus chronic following her recent hospitalization/CVA.  This is likely potentiated by her suboptimal oral fluid intake.  Sodium levels corrected with intravenous fluids and we will now convert her to maintenance D5W at 50 cc an hour to help maintain eunatremia.  Renal function improving. 2.  Acute kidney injury on chronic kidney disease stage IIIb-stage IV: Appears hemodynamically mediated and partly improved with half-normal saline.  Renal function improving and back towards baseline with ongoing fluid replacement. 3.  Atrial fibrillation: Rate controlled on metoprolol and not a candidate for anticoagulation due to symptomatic arteriovenous malformations. 4.  Aphasia/physical deconditioning status post left basal ganglia/internal capsule CVA  Subjective:   Hypoglycemia noted earlier this morning.  Modified barium swallow performed yesterday with recommendations for dysphagia 1 diet and nectar thick liquids.   Objective:   BP (!) 155/52 (BP Location: Left Arm)   Pulse (!) 44   Temp (!) 97.5 F (36.4 C) (Oral)   Resp 16   Ht 5\' 5"  (1.651 m)   Wt 55.8 kg   SpO2 100%   BMI 20.47 kg/m   Intake/Output Summary (Last 24 hours) at 01/19/2022 1128 Last data filed at 01/19/2022 0407 Gross per 24 hour  Intake 1389.9 ml  Output --  Net 1389.9 ml   Weight change:   Physical Exam: Gen: Sitting comfortably in wheelchair, nods to questions/comments, nonverbal CVS: Pulse regular rhythm, normal rate, S1 and S2 are normal Resp: Diminished breath sounds over bases, no distinct rales or rhonchi Abd: Soft, flat, nontender, bowel sounds normal Ext: No lower extremity edema  Imaging: DG Swallowing Func-Speech Pathology  Result Date: 01/18/2022 Table formatting from the original result was not included. Objective  Swallowing Evaluation: Type of Study: MBS-Modified Barium Swallow Study  Patient Details Name: Patricia Dudley MRN: [id number] Date of Birth: 01-13-1930 Today's Date: 01/18/2022 Time: SLP Start Time (ACUTE ONLY): 1555 -SLP Stop Time (ACUTE ONLY): 1610 SLP Time Calculation (min) (ACUTE ONLY): 15 min Past Medical History: Past Medical History: Diagnosis Date  A-fib ([place])   CVA (cerebral vascular accident) ([place])   Dementia ([place])   Diabetes mellitus without complication ([place])   GI bleed   Hyperlipemia   Hypertension   Thyroid disease  Past Surgical History: Past Surgical History: Procedure Laterality Date  COLONOSCOPY N/A 05/16/2017  Procedure: COLONOSCOPY;  Surgeon: Lin Landsman, MD;  Location: [place];  Service: Gastroenterology;  Laterality: N/A;  ESOPHAGOGASTRODUODENOSCOPY N/A 08/08/2016  Procedure: ESOPHAGOGASTRODUODENOSCOPY (EGD);  Surgeon: [name], MD;  Location: [facility] ENDOSCOPY;  Service: Endoscopy;  Laterality: N/A;  ESOPHAGOGASTRODUODENOSCOPY N/A 05/16/2017  Procedure: ESOPHAGOGASTRODUODENOSCOPY (EGD);  Surgeon: Lin Landsman, MD;  Location: [facility] ENDOSCOPY;  Service: Gastroenterology;  Laterality: N/A;  ESOPHAGOGASTRODUODENOSCOPY (EGD) WITH PROPOFOL N/A 02/28/2017  Procedure: ESOPHAGOGASTRODUODENOSCOPY (EGD) WITH PROPOFOL;  Surgeon: [name], MD;  Location: [facility] ENDOSCOPY;  Service: Endoscopy;  Laterality: N/A;  EYE SURGERY    left HPI: Patricia Dudley is a 88 y.o. female who presented to [facility] ED via EMS for aphasia, left gaze and slurred speech. Pt with recent episondes of vomiting at home and in ED. MRI 5/2: "positive for acute small vessel infarct at the left internal capsule." CXR 5/1: "Mild airspace disease at the left base, favor atelectasis."  Pt  with past medical history of HTN, HLD, A fib not on AC,  GI bleed, dementia, CVA, hypothyroidism and DM.  Subjective: Family present for assessment- daughter [name]  Recommendations for follow up therapy are one component of a  multi-disciplinary discharge planning process, led by the attending physician.  Recommendations may be updated based on patient status, additional functional criteria and insurance authorization. Assessment / Plan / Recommendation   01/18/2022   2:28 PM Clinical Impressions Clinical Impression MBSS completed. Per instrumental findings, pt continues to present with moderate oropharyngeal dysphagia which continues to result from poor motor planning and cognitive impairment. No functional change noted in today's swallow function when compared to MBSS completed on 01/09/2022. Pt continues to demonstrate silent aspiration of thin liquids via tsp and cup sips, as well as an overall elevated risk of development of aspiration related complications (e.g. PNA, illness). Pt inconsistently follows commands with no postural and behavioral strategies appearing beneficial (chin tuck, effortful swallow, and bolus hold). Prognosis for advancing pt's diet without increasing aspiration risk is very guarded at this time. Additionally, pt remains at an elevated risk of malnutrition and dehydration due to need for full assistance for self-feeding and oral care. Please see below for diet recommendations and MBSS report for oropharyngeal impairments. SLP Visit Diagnosis Dysphagia, oropharyngeal phase (R13.12) Impact on safety and function Risk for inadequate nutrition/hydration;Moderate aspiration risk     12/25/2021   9:58 AM Treatment Recommendations Treatment Recommendations Therapy as outlined in treatment plan below     01/18/2022   2:29 PM Prognosis Prognosis for Safe Diet Advancement Guarded Barriers to Reach Goals Cognitive deficits;Severity of deficits;Language deficits;Time post onset;Behavior   01/18/2022   2:28 PM Diet Recommendations SLP Diet Recommendations Dysphagia 1 (Puree) solids;No mixed consistencies;Nectar thick liquid Liquid Administration via Cup;Straw Medication Administration Crushed with puree Compensations Slow  rate;Small sips/bites;Minimize environmental distractions;Other (Comment) Postural Changes Remain semi-upright after after feeds/meals (Comment);Seated upright at 90 degrees     01/18/2022   2:28 PM Other Recommendations Oral Care Recommendations Oral care before and after PO;Oral care BID   01/18/2022   2:28 PM Frequency and Duration  Treatment Duration 2 weeks     01/18/2022   2:24 PM Oral Phase Oral Phase Impaired Oral - Honey Teaspoon NT Oral - Honey Cup NT Oral - Nectar Teaspoon NT Oral - Nectar Cup Lingual pumping;Weak lingual manipulation;Piecemeal swallowing;Decreased bolus cohesion;Premature spillage Oral - Nectar Straw Lingual pumping;Weak lingual manipulation;Piecemeal swallowing;Premature spillage;Decreased bolus cohesion Oral - Thin Teaspoon Weak lingual manipulation;Lingual pumping;Piecemeal swallowing;Decreased bolus cohesion;Premature spillage Oral - Thin Cup Weak lingual manipulation;Lingual pumping;Decreased bolus cohesion;Premature spillage;Piecemeal swallowing Oral - Thin Straw NT Oral - Puree Weak lingual manipulation;Lingual pumping;Delayed oral transit;Decreased bolus cohesion;Piecemeal swallowing;Holding of bolus;Premature spillage Oral - Mech Soft NT Oral - Regular NT Oral - Multi-Consistency NT Oral - Pill NT    01/18/2022   2:26 PM Pharyngeal Phase Pharyngeal Phase Impaired Pharyngeal- Honey Teaspoon NT Pharyngeal- Honey Cup NT Pharyngeal- Nectar Teaspoon NT Pharyngeal- Nectar Cup Delayed swallow initiation-vallecula;Delayed swallow initiation-pyriform sinuses Pharyngeal Material enters airway, remains ABOVE vocal cords then ejected out Pharyngeal- Nectar Straw Delayed swallow initiation-vallecula;Delayed swallow initiation-pyriform sinuses;Reduced airway/laryngeal closure Pharyngeal Material enters airway, remains ABOVE vocal cords then ejected out Pharyngeal- Thin Teaspoon Delayed swallow initiation-pyriform sinuses;Delayed swallow initiation-vallecula;Reduced laryngeal elevation;Reduced  airway/laryngeal closure;Penetration/Aspiration during swallow;Trace aspiration Pharyngeal Material enters airway, passes BELOW cords without attempt by patient to eject out (silent aspiration) Pharyngeal- Thin Cup Delayed swallow initiation-vallecula;Delayed swallow initiation-pyriform sinuses;Reduced laryngeal elevation;Reduced airway/laryngeal closure;Penetration/Aspiration during swallow;Trace aspiration Pharyngeal Material enters airway, passes BELOW cords without attempt by patient to eject out (silent  aspiration) Pharyngeal- Thin Straw NT Pharyngeal- Puree Delayed swallow initiation-vallecula Pharyngeal Material does not enter airway Pharyngeal- Mechanical Soft NT Pharyngeal- Regular NT Pharyngeal- Multi-consistency NT Pharyngeal- Pill NT    01/18/2022   2:27 PM Cervical Esophageal Phase  Cervical Esophageal Phase [facility] Bethany A Lutes 01/18/2022, 2:39 PM                      Labs: BMET Recent Labs  Lab 01/16/22 1528 01/16/22 1947 01/17/22 0546 01/17/22 1310 01/17/22 1936 01/18/22 0538 01/19/22 0920  NA 155* 153* 156* 154* 151* 150* 145  K 4.5 5.0 4.4 5.3* 4.6 4.1 4.2  CL 127* 129* 129* 130* 125* 124* 119*  CO2 18* 17* 20* 18* 19* 21* 21*  GLUCOSE 371* 347* 86 217* 321* 73 92  BUN 77* 80* 69* 66* 72* 62* 47*  CREATININE 3.69* 3.60* 3.21* 3.19* 3.38* 3.10* 2.76*  CALCIUM 8.4* 7.9* 8.3* 8.4* 7.9* 8.1* 8.2*   CBC Recent Labs  Lab 01/14/22 0618  WBC 8.5  HGB 9.9*  HCT 31.2*  MCV 88.4  PLT 521*    Medications:     amLODipine  10 mg Oral Daily   aspirin EC  81 mg Oral Daily   Gerhardt's butt cream   Topical QID   glipiZIDE  10 mg Oral QPC supper   heparin  5,000 Units Subcutaneous Q8H   insulin aspart  0-9 Units Subcutaneous TID WC   insulin glargine-yfgn  12 Units Subcutaneous QHS   latanoprost  1 drop Both Eyes QHS   levothyroxine  112 mcg Oral Q0600   magnesium gluconate  250 mg Oral QHS   multivitamin with minerals  1 tablet Oral Daily   nystatin  5 mL Oral QID    pantoprazole  40 mg Oral Daily   polycarbophil  625 mg Oral Daily   vitamin B-12  100 mcg Oral Daily   Elmarie Shiley, MD 01/19/2022, 11:28 AM

## 2022-01-19 NOTE — Progress Notes (Signed)
PROGRESS NOTE   Subjective/Complaints:  Pt woke briefly, but turned her head to go back to sleep- per nursing, this is her regular course of behavior in AM  ROS: limited by cognition/sedation  Objective:   DG Swallowing Func-Speech Pathology  Result Date: 01/18/2022 Table formatting from the original result was not included. Objective Swallowing Evaluation: Type of Study: MBS-Modified Barium Swallow Study  Patient Details Name: Patricia Dudley MRN: [id number] Date of Birth: September 15, 1929 Today's Date: 01/18/2022 Time: SLP Start Time (ACUTE ONLY): 1555 -SLP Stop Time (ACUTE ONLY): 1610 SLP Time Calculation (min) (ACUTE ONLY): 15 min Past Medical History: Past Medical History: Diagnosis Date  A-fib ([place])   CVA (cerebral vascular accident) ([place])   Dementia ([place])   Diabetes mellitus without complication ([place])   GI bleed   Hyperlipemia   Hypertension   Thyroid disease  Past Surgical History: Past Surgical History: Procedure Laterality Date  COLONOSCOPY N/A 05/16/2017  Procedure: COLONOSCOPY;  Surgeon: Lin Landsman, MD;  Location: [place];  Service: Gastroenterology;  Laterality: N/A;  ESOPHAGOGASTRODUODENOSCOPY N/A 08/08/2016  Procedure: ESOPHAGOGASTRODUODENOSCOPY (EGD);  Surgeon: [name], MD;  Location: [facility] ENDOSCOPY;  Service: Endoscopy;  Laterality: N/A;  ESOPHAGOGASTRODUODENOSCOPY N/A 05/16/2017  Procedure: ESOPHAGOGASTRODUODENOSCOPY (EGD);  Surgeon: Lin Landsman, MD;  Location: [facility] ENDOSCOPY;  Service: Gastroenterology;  Laterality: N/A;  ESOPHAGOGASTRODUODENOSCOPY (EGD) WITH PROPOFOL N/A 02/28/2017  Procedure: ESOPHAGOGASTRODUODENOSCOPY (EGD) WITH PROPOFOL;  Surgeon: [name], MD;  Location: [facility] ENDOSCOPY;  Service: Endoscopy;  Laterality: N/A;  EYE SURGERY    left HPI: Patricia Dudley is a 88 y.o. female who presented to [facility] ED via EMS for aphasia, left gaze and slurred speech. Pt with recent episondes of vomiting  at home and in ED. MRI 5/2: "positive for acute small vessel infarct at the left internal capsule." CXR 5/1: "Mild airspace disease at the left base, favor atelectasis."  Pt  with past medical history of HTN, HLD, A fib not on AC, GI bleed, dementia, CVA, hypothyroidism and DM.  Subjective: Family present for assessment- daughter [name]  Recommendations for follow up therapy are one component of a multi-disciplinary discharge planning process, led by the attending physician.  Recommendations may be updated based on patient status, additional functional criteria and insurance authorization. Assessment / Plan / Recommendation   01/18/2022   2:28 PM Clinical Impressions Clinical Impression MBSS completed. Per instrumental findings, pt continues to present with moderate oropharyngeal dysphagia which continues to result from poor motor planning and cognitive impairment. No functional change noted in today's swallow function when compared to MBSS completed on 01/09/2022. Pt continues to demonstrate silent aspiration of thin liquids via tsp and cup sips, as well as an overall elevated risk of development of aspiration related complications (e.g. PNA, illness). Pt inconsistently follows commands with no postural and behavioral strategies appearing beneficial (chin tuck, effortful swallow, and bolus hold). Prognosis for advancing pt's diet without increasing aspiration risk is very guarded at this time. Additionally, pt remains at an elevated risk of malnutrition and dehydration due to need for full assistance for self-feeding and oral care. Please see below for diet recommendations and MBSS report for oropharyngeal impairments. SLP Visit Diagnosis Dysphagia, oropharyngeal phase (R13.12)  Impact on safety and function Risk for inadequate nutrition/hydration;Moderate aspiration risk     12/25/2021   9:58 AM Treatment Recommendations Treatment Recommendations Therapy as outlined in treatment plan below     01/18/2022   2:29 PM  Prognosis Prognosis for Safe Diet Advancement Guarded Barriers to Reach Goals Cognitive deficits;Severity of deficits;Language deficits;Time post onset;Behavior   01/18/2022   2:28 PM Diet Recommendations SLP Diet Recommendations Dysphagia 1 (Puree) solids;No mixed consistencies;Nectar thick liquid Liquid Administration via Cup;Straw Medication Administration Crushed with puree Compensations Slow rate;Small sips/bites;Minimize environmental distractions;Other (Comment) Postural Changes Remain semi-upright after after feeds/meals (Comment);Seated upright at 90 degrees     01/18/2022   2:28 PM Other Recommendations Oral Care Recommendations Oral care before and after PO;Oral care BID   01/18/2022   2:28 PM Frequency and Duration  Treatment Duration 2 weeks     01/18/2022   2:24 PM Oral Phase Oral Phase Impaired Oral - Honey Teaspoon NT Oral - Honey Cup NT Oral - Nectar Teaspoon NT Oral - Nectar Cup Lingual pumping;Weak lingual manipulation;Piecemeal swallowing;Decreased bolus cohesion;Premature spillage Oral - Nectar Straw Lingual pumping;Weak lingual manipulation;Piecemeal swallowing;Premature spillage;Decreased bolus cohesion Oral - Thin Teaspoon Weak lingual manipulation;Lingual pumping;Piecemeal swallowing;Decreased bolus cohesion;Premature spillage Oral - Thin Cup Weak lingual manipulation;Lingual pumping;Decreased bolus cohesion;Premature spillage;Piecemeal swallowing Oral - Thin Straw NT Oral - Puree Weak lingual manipulation;Lingual pumping;Delayed oral transit;Decreased bolus cohesion;Piecemeal swallowing;Holding of bolus;Premature spillage Oral - Mech Soft NT Oral - Regular NT Oral - Multi-Consistency NT Oral - Pill NT    01/18/2022   2:26 PM Pharyngeal Phase Pharyngeal Phase Impaired Pharyngeal- Honey Teaspoon NT Pharyngeal- Honey Cup NT Pharyngeal- Nectar Teaspoon NT Pharyngeal- Nectar Cup Delayed swallow initiation-vallecula;Delayed swallow initiation-pyriform sinuses Pharyngeal Material enters airway, remains  ABOVE vocal cords then ejected out Pharyngeal- Nectar Straw Delayed swallow initiation-vallecula;Delayed swallow initiation-pyriform sinuses;Reduced airway/laryngeal closure Pharyngeal Material enters airway, remains ABOVE vocal cords then ejected out Pharyngeal- Thin Teaspoon Delayed swallow initiation-pyriform sinuses;Delayed swallow initiation-vallecula;Reduced laryngeal elevation;Reduced airway/laryngeal closure;Penetration/Aspiration during swallow;Trace aspiration Pharyngeal Material enters airway, passes BELOW cords without attempt by patient to eject out (silent aspiration) Pharyngeal- Thin Cup Delayed swallow initiation-vallecula;Delayed swallow initiation-pyriform sinuses;Reduced laryngeal elevation;Reduced airway/laryngeal closure;Penetration/Aspiration during swallow;Trace aspiration Pharyngeal Material enters airway, passes BELOW cords without attempt by patient to eject out (silent aspiration) Pharyngeal- Thin Straw NT Pharyngeal- Puree Delayed swallow initiation-vallecula Pharyngeal Material does not enter airway Pharyngeal- Mechanical Soft NT Pharyngeal- Regular NT Pharyngeal- Multi-consistency NT Pharyngeal- Pill NT    01/18/2022   2:27 PM Cervical Esophageal Phase  Cervical Esophageal Phase [facility] Patricia Dudley 01/18/2022, 2:39 PM                     No results for input(s): WBC, HGB, HCT, PLT in the last 72 hours.   Recent Labs    01/18/22 0538 01/19/22 0920  NA 150* 145  K 4.1 4.2  CL 124* 119*  CO2 21* 21*  GLUCOSE 73 92  BUN 62* 47*  CREATININE 3.10* 2.76*  CALCIUM 8.1* 8.2*     Intake/Output Summary (Last 24 hours) at 01/19/2022 1258 Last data filed at 01/19/2022 0407 Gross per 24 hour  Intake 1389.9 ml  Output --  Net 1389.9 ml        Physical Exam: Vital Signs Blood pressure (!) 155/52, pulse (!) 44, temperature (!) 97.5 F (36.4 C), temperature source Oral, resp. rate 16, height 5\' 5"  (1.651 m), weight 55.8 kg, SpO2 100 %.    General: sleepy- frail appearing-  BMI 20;  asleep until woke briefly; NAD HENT: eyes closed except for a few moments; mouth a little dry; getting IVFs CV: bradycardic;  no JVD Pulmonary: CTA B/L; no W/R/R- GI: soft, NT, ND, (+)BS Psychiatric: sleepy Neurological: nonverbal  Musculoskeletal:     Cervical back: Neck supple. Protracted posture    Comments: Limited participation in movement today, squeeze hand: L grip/biceps 5-/5 BUE: 3/5 grip/biceps.  Would not participate in RLE and LLE movement. Skin:    General: Skin is warm and dry.  Neurological:     Mental Status: She is alert.     Comments: Squeezes hands on command, did protrude tongue on command today, made attempt to smile when asked. Minimal verbal output Was able to remain awake and alert during exam today Ambulating ModA with RW. Sit to stand MinA +echolalia Psychiatric:     Comments: Smiling,  +echolalia GU: continent of bladder at times, incontinent of bowel  Assessment/Plan: 1. Functional deficits which require 3+ hours per day of interdisciplinary therapy in a comprehensive inpatient rehab setting. Physiatrist is providing close team supervision and 24 hour management of active medical problems listed below. Physiatrist and rehab team continue to assess barriers to discharge/monitor patient progress toward functional and medical goals  Care Tool:  Bathing    Body parts bathed by patient: Right arm, Left arm, Chest, Abdomen, Left upper leg, Right lower leg   Body parts bathed by helper: Left lower leg, Right lower leg Body parts n/a: Front perineal area, Buttocks   Bathing assist Assist Level: Maximal Assistance - Patient 24 - 49%     Upper Body Dressing/Undressing Upper body dressing   What is the patient wearing?: Pull over shirt    Upper body assist Assist Level: Moderate Assistance - Patient 50 - 74%    Lower Body Dressing/Undressing Lower body dressing      What is the patient wearing?: Incontinence brief, Pants     Lower body  assist Assist for lower body dressing: Moderate Assistance - Patient 50 - 74%     Toileting Toileting    Toileting assist Assist for toileting: Total Assistance - Patient < 25%     Transfers Chair/bed transfer  Transfers assist     Chair/bed transfer assist level: Moderate Assistance - Patient 50 - 74%     Locomotion Ambulation   Ambulation assist   Ambulation activity did not occur: Safety/medical concerns  Assist level: Minimal Assistance - Patient > 75% Assistive device: Walker-rolling Max distance: 15ft   Walk 10 feet activity   Assist  Walk 10 feet activity did not occur: Safety/medical concerns  Assist level: Minimal Assistance - Patient > 75% Assistive device: Walker-rolling   Walk 50 feet activity   Assist Walk 50 feet with 2 turns activity did not occur: Safety/medical concerns         Walk 150 feet activity   Assist Walk 150 feet activity did not occur: Safety/medical concerns         Walk 10 feet on uneven surface  activity   Assist Walk 10 feet on uneven surfaces activity did not occur: Safety/medical concerns         Wheelchair     Assist Is the patient using a wheelchair?: No   Wheelchair activity did not occur: Safety/medical concerns         Wheelchair 50 feet with 2 turns activity    Assist    Wheelchair 50 feet with 2 turns activity did not occur: Safety/medical concerns  Wheelchair 150 feet activity     Assist  Wheelchair 150 feet activity did not occur: Safety/medical concerns       Blood pressure (!) 155/52, pulse (!) 44, temperature (!) 97.5 F (36.4 C), temperature source Oral, resp. rate 16, height 5\' 5"  (1.651 m), weight 55.8 kg, SpO2 100 %.    Medical Problem List and Plan: 1. 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             -patient may  shower             -ELOS/Goals: expected dc 6/7 min A to mod A and Max a  for auditory comprehension and Total A for verbal comprehension  -con't CIR- PT, OT and SLP 2.  Anemia: continue Heparin [place] TID. Transfused 1U on 5/15. Post-transfusion hemoglobin trending upward nicely, repeat Monday              -antiplatelet therapy: aspirin 81 mg daily 3. Pain Management: Tylenol prn 4. Mood: LCSW to evaluate and provide emotional support             -antipsychotic agents: n/a 5. Neuropsych: This patient is not capable of making decisions on her own behalf. 6. Skin/Wound Care: Routine skin care checks 7. Dysphagia             -- continue Dysphagia 1 diet with thin liquids.  Crushed meds with applesauce.  SLP evaluation. MBS discussed with SLP and shows high risk of aspiration with thin liquids.              -- Continue nutritional supplements, B12, MVI   8: Paroxysmal atrial fibrillation: continue Lopressor 50 mg BID --home torsemide on hold --no AC due to small bowel AVMs Add magnesium gluconate 250mg  HS    01/19/2022    3:36 AM 01/18/2022    8:19 PM 01/18/2022    1:25 PM  Vitals with BMI  Systolic 681 157 262  Diastolic 52 75 83  Pulse 44 46 107    9: Diabetes mellitus: CBGs qAC,qHS; monitor intake and activity --continue SSI --increase glipizide back to 10mg  (home dose)- changed timing to after supper due to AM hypoglycemia and elevated CBGs HS Increase Semglee to 11U started HS D/c Ensure between meals D/c HS sliding scale due to AM hypoglycemia.  10: Hypertension: d/c Lisinopril due to worsening kidney function, continue amlodipine 10 mg, Lopressor. Repeat creatinine on Monday given increase in Lisinopril.  5/13 BP controlled today. 5/14 some variability in B/P, with mild elevation, some episodes of bradycardia (HR 46 at 051 today). -D/c lisinopril today since BUN 28->68 and Crt 2.28->3.20, with increase in past 2 days.  Hold lopressor with bradycardia episodes.  Will see how B/P trends on amlodipine 10 mg, may consider adding hydralazine if B/p is elevated.    -now hypotensive, change fluids to NS HS 12 hours -5/22 BP a little stable currently, will continue to monitor 5/27- BP slightly elevated in 150s, however off ACE inhibitor- likely up since beocming more euvolemic. Will monitor for trend.      01/19/2022    3:36 AM 01/18/2022    8:19 PM 01/18/2022    1:25 PM  Vitals with BMI  Systolic [phone]  Diastolic 52 75 83  Pulse 44 46 107   11: Hyperlipidemia: continue rosuvastatin 10 mg 12: Moderate malnutrition: continue supplements 13: Chronic kidney disease stage 3b: follow-up BMP     -5/13 B/P is in the  120's today. Pt had lisinopril increased to 40 mg yesterday for elevated B/P.  Since lisinopril may increase Crt, decrease lisinopril to 20 mg daily today and push po intake and repeat BMP. -5/14 Crt up to 3.2 and BUN up to 68.  Fluid bolus (1L NS) and continuous fluid (0.45 NS at 75 ml/hr) given for rise in BUN and Crt. -d/c lisinopril (per Dr. Christella Noa) since could be a possible cause of elevated Crt . Cr elevated, nephrology consulted. Urine studies obtained, creatinine improving, continue D5W 5/27- Cr down to 2.75 from 3.01- Renal on board- BUN 47    Latest Ref Rng & Units 01/19/2022    9:20 AM 01/18/2022    5:38 AM 01/17/2022    7:36 PM  BMP  Glucose 70 - 99 mg/dL 92   73   321    BUN 8 - 23 mg/dL 47   62   72    Creatinine 0.44 - 1.00 mg/dL 2.76   3.10   3.38    Sodium 135 - 145 mmol/L 145   150   151    Potassium 3.5 - 5.1 mmol/L 4.2   4.1   4.6    Chloride 98 - 111 mmol/L 119   124   125    CO2 22 - 32 mmol/L 21   21   19     Calcium 8.9 - 10.3 mg/dL 8.2   8.1   7.9      14: Glaucoma: continue latanoprost drops 15: Hypothyroidism: continue Synthroid 16: Diarrhea/loose stools: monitor; getting loperamide 2 mg daily (BID prn) -5/13 Continue current regimen. 17: Bowel and bladder incontinence: start bowel and bladder program 18. Daytime somnolence: [employer] and SW if therapy can be scheduled a little later in the day  and if daughters can be present to help motivate her  5/27- still sleepy- might be worth looking into Ritalin? 19. Bradycardia: discontinue lopressor. 5/27- HR in 40s- just for last 12 hours- will recheck with nursing- if still less than 60, will order EKG  20. History of GI bleed: start protonix.  21. Hypotension: resolved, hemoglobin reviewed and stable 22. Neurogenic bladder: bladder program ordered starting 5/11. 23. Stool incontinence: fiber added to bulk stool 24. Screening for vitamin D deficiency: check vitamin D level on Monday 25. Fatigue:   -5/22 Iron 68, HGB slightly improved to 9.9 26. Elevated chloride: notified nephrology 27. Hypernatremia Na 156: Nephrology consulted. Decrease D5W to 148ml/hr  5/27- Na 145- per Nephrology, reduced to 50cc/hour to maintain euvolemia- and also seen by SLP- now on Nectar thick liquids and D1 diet.      I spent a total of 37   minutes on total care today- >50% coordination of care- due to d/w nursing about bradycardia and getting EKG.    LOS: 16 days A FACE TO FACE EVALUATION WAS PERFORMED  Paislynn Hegstrom 01/19/2022, 12:58 PM

## 2022-01-19 NOTE — Progress Notes (Signed)
Received call from Dr Dagoberto Ligas, HR documented in low 40s this am and was not rechecked. Checked apical HR and is 80, reported back to MD.  Notified nurse and NT that HR needs to be taken manually at all times.

## 2022-01-19 NOTE — Progress Notes (Addendum)
Speech Language Pathology Weekly Progress and Session Note  Patient Details  Name: Patricia Dudley MRN: [id number] Date of Birth: 31-Jul-1930  Beginning of progress report period: Jan 18, 2022 End of progress report period: January 25, 2022  Today's Date: 01/19/2022 SLP Individual Time: 1120-1200 SLP Individual Time Calculation (min): 40 min  Short Term Goals: Week 2: SLP Short Term Goal 1 (Week 2): Pt will follow 3 commands within a functional context given Mod multimodal A. - x 3 with Mod multimodal A SLP Short Term Goal 1 - Progress (Week 2): Met SLP Short Term Goal 2 (Week 2): Pt will communicate wants, needs, ideas, and thoughts x 5 given Max  A for use of mulitmodal communication methods. - x 5 with Min A SLP Short Term Goal 2 - Progress (Week 2): Met SLP Short Term Goal 3 (Week 2): Pt will consume current diet with minimal overt s/s of aspiration with Mod verbal cues for use of swallowing compensatory strategies. - No s/sx concerning for aspiration of NTL with Max A for self-feeding. SLP Short Term Goal 3 - Progress (Week 2): Not met SLP Short Term Goal 4 (Week 2): Pt will maintain sustianed attention for 5 minutes with Mod A. - Sustained attention to task for 5 minutes with Mod A. SLP Short Term Goal 4 - Progress (Week 2): Met    New Short Term Goals: Week 3: SLP Short Term Goal 1 (Week 3): Pt will follow 5 commands within a functional context given Mod multimodal A. SLP Short Term Goal 2 (Week 3): Pt will communicate wants, needs, ideas, and thoughts x 5 given Min-Mod A and use of mulitmodal communication methods. SLP Short Term Goal 3 (Week 3): Pt will consume pureed textures with minimal oral holding/pocketing given Mod A multimodal cues. SLP Short Term Goal 4 (Week 3): Pt will maintain sustianed attention for 5 minutes with Min A.  Weekly Progress Updates: Pt has demonstrated slow gains this week and has met 3 out of 4 short-term goals. Pt education is limited in the setting of  severe cognitive-linguistic impairment; however, family education has been initiated re: MBSS results from 02/18/2022 (pureed textures/Dsyphagia 1 + NTL), diet recommendations, aspiration precautions, importance of oral care after PO intake, strategies to decrease bolus holding, and ramifications of aspiration; pt's daughter verbalized understanding and all questions were answered. Continue to recommend ST intervention during CIR admission + f/u at time of d/c with 24/7 supervision and assistance.   Intensity: Minumum of 1-2 x/day, 30 to 90 minutes Frequency: 3 to 5 out of 7 days Duration/Length of Stay: 01/30/22 Treatment/Interventions: Speech/Language facilitation;Patient/family education;Multimodal communication approach;Therapeutic Activities;Dysphagia/aspiration precaution training;Functional tasks;Cognitive remediation/compensation;Internal/external aids;Cueing hierarchy;Environmental controls   Daily Session  Skilled Therapeutic Interventions: Pt seen this date for skilled ST intervention targeting dysphagia and communication/language goals outlined above. Pt received awake/alert and OOB in TIS. Receiving IV fluids. Daughter, [place], joined halfway through today's session. Participated in ST intervention.   Please see above for objective data re: pt's performance on targeted goals. Pt and family education completed re: MBSS results from 02/18/2022 (reviewed study images), diet recommendations, aspiration precautions, importance of oral care after PO intake, strategies to decrease bolus holding, and ramifications of aspiration; pt's daughter verbalized understanding and all questions were answered. Pt with slightly improved verbal responses to yes/no and close-ended questions re: biographical and factual information + less latency between question and responses this date.  Overall, pt was somewhat stimulable for skilled intervention and would continue to benefit from ST targeting  goals outlined  above. Pt left in TIS with all safety measures activated. Daughter remained at bedside. Continue per updated POC.     Pain No indication of pain via non-verbal means.  Therapy/Group: Individual Therapy  Alda Gaultney A Armonie Mettler 01/19/2022, 3:20 PM

## 2022-01-20 LAB — GLUCOSE, CAPILLARY
Glucose-Capillary: 101 mg/dL — ABNORMAL HIGH (ref 70–99)
Glucose-Capillary: 268 mg/dL — ABNORMAL HIGH (ref 70–99)
Glucose-Capillary: 253 mg/dL — ABNORMAL HIGH (ref 70–99)
Glucose-Capillary: 171 mg/dL — ABNORMAL HIGH (ref 70–99)

## 2022-01-20 LAB — RENAL FUNCTION PANEL
Albumin: 2.5 g/dL — ABNORMAL LOW (ref 3.5–5.0)
Anion gap: 5 (ref 5–15)
BUN: 53 mg/dL — ABNORMAL HIGH (ref 8–23)
CO2: 21 mmol/L — ABNORMAL LOW (ref 22–32)
Calcium: 8.2 mg/dL — ABNORMAL LOW (ref 8.9–10.3)
Chloride: 122 mmol/L — ABNORMAL HIGH (ref 98–111)
Creatinine, Ser: 2.72 mg/dL — ABNORMAL HIGH (ref 0.44–1.00)
GFR, Estimated: 16 mL/min — ABNORMAL LOW (ref >60)
Glucose, Bld: 129 mg/dL — ABNORMAL HIGH (ref 70–99)
Phosphorus: 3.3 mg/dL (ref 2.5–4.6)
Potassium: 4.2 mmol/L (ref 3.5–5.1)
Sodium: 148 mmol/L — ABNORMAL HIGH (ref 135–145)

## 2022-01-20 MED ORDER — INSULIN GLARGINE-YFGN 100 UNIT/ML ~~LOC~~ SOLN
14.0000 [IU] | Freq: Every day | SUBCUTANEOUS | Status: DC
  Administered 2022-01-20 – 2022-01-21 (×2): 14 [IU] via SUBCUTANEOUS
  Filled 2022-01-20 (×3): qty 0.14

## 2022-01-20 NOTE — Consult Note (Signed)
Consultation Note Date: 01/18/2022    Patient Name: Patricia Dudley  DOB:   February 16, 1930        MRN:   [id number]      Age / Sex:       88 y.o., female  PCP: Tracie Harrier, MD Referring Physician: Izora Ribas, MD   Reason for Consultation: Establishing goals of care   HPI/Patient Profile: 88 y.o. 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    PMT has been consulted to assist with goals of care conversation regarding nutrition given continued high risk of aspiration with thin liquids. Diet advanced to dysphagia 3/pured meats with thin liquids on 5/4, but downgraded back to dysphagia 1 diet, crushed meds on 5/5.    Clinical Assessment and Goals of Care:   I have reviewed medical records including EPIC notes, labs and imaging, assessed the patient and then met at the bedside with daughter [place] and called daughter/HCPOA [name] to discuss diagnosis prognosis, GOC, EOL wishes, disposition and options.   I introduced Palliative Medicine as specialized medical care for people living with serious illness. It focuses on providing relief from the symptoms and stress of a serious illness. The goal is to improve quality of life for both the patient and the family.   We discussed a brief life review of the patient and then focused on their current illness.    I attempted to elicit values and goals of care important to the patient.     Medical History Review and Understanding:  Discussed patient's previous stroke around 15 years ago, ongoing management  of her chronic comorbidities, and recent stroke with complicating pneumonia.    Social History: Patient has 10 children who are all involved in her care. 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   Advance Directives: A detailed discussion regarding advanced directives was had. Patient's daughter [name] is HCPOA.     Discussion: Patient's daughter [place] shares that all 10 children are involved in major decisions regarding patient's care.  They are hopeful that she will recover as she has in the past with a previous stroke.  We discussed the risks with ongoing dysphagia and that decisions regarding nutrition may soon become urgent.  I recommended family meeting to discuss in detail sooner rather than later.  She notes that her Sister [name] is power of attorney and is out of town until Wednesday.  I called [name] and provided updates on today's MBS, concerns for dysphagia without improvement, and recommendations for a family meeting to discuss goals of care.  She does not have much time to discuss further but states she will get in touch with her Sister [place] for PMT contact information and reach out when they have discussed preferences for scheduling.     Discussed the importance of continued conversation with family and the medical providers regarding overall plan of care and treatment options, ensuring decisions are within the context of the patient's values and GOCs.    Questions and concerns were addressed.  Hard Choices booklet left for review. The family was encouraged to call with questions or concerns.  PMT will continue to support holistically.  SUMMARY OF RECOMMENDATIONS   -Patient's family will coordinate to schedule preferred date/time for family meeting and [place] discussion regarding nutritional goals -Will follow up on CODE STATUS, per records patient was DNR throughout previous admission -PMT will  continue to follow   Prognosis:  Guarded   Discharge Planning: To Be Determined         Primary Diagnoses: Present on Admission:  Acute embolic stroke [facility])     Physical Exam Vitals and nursing note reviewed.  Constitutional:      General: She is not in acute distress. Neurological:     Mental Status: She is alert.      Vital Signs: BP 123/83 (BP Location: Left Arm)   Pulse (!) 107   Temp (!) 97.4 F (36.3 C)   Resp 18   Ht 5' 5"  (1.651 m)   Wt 55.8 kg   SpO2 100%   BMI 20.47 kg/m  Pain Scale: Faces Pain Score: 0-No pain     SpO2: SpO2: 100 % O2 Device:SpO2: 100 % O2 Flow Rate: .      Palliative Assessment/Data:        Total time: I spent 45 minutes in the care of the patient today in the above activities and documenting the encounter.     Nikki Rusnak Johnnette Litter, PA-C   Palliative Medicine Team Team phone # [phone]   Thank you for allowing the Palliative Medicine Team to assist in the care of this patient. Please utilize secure chat with additional questions, if there is no response within 30 minutes please call the above phone number.   Palliative Medicine Team providers are available by phone from 7am to 7pm daily and can be reached through the team cell phone.  Should this patient require assistance outside of these hours, please call the patient's attending physician.

## 2022-01-20 NOTE — Progress Notes (Signed)
Daily Progress Note   Patient Name: Patricia Dudley       Date: 01/20/2022 DOB: 12-12-1929  Age: 88 y.o. MRN#: [id number] Attending Physician: Izora Ribas, MD Primary Care Physician: Tracie Harrier, MD Admit Date: 01/03/2022  Reason for Consultation/Follow-up: Establishing goals of care  Subjective: Medical records reviewed including progress notes, labs. Patient assessed at the bedside. Sitting in wheelchair, alert and in no acute distress. No family present during my visit.  Called patient's daughter/HCPOA [name] to provide palliative support and follow up on family's preferred date/time for [employer] discussion regarding nutritional goals. Unable to reach. Left VM with PMT contact information.   Questions and concerns addressed. PMT will continue to support holistically.   Length of Stay: 17   Physical Exam Vitals and nursing note reviewed.  Constitutional:      General: She is not in acute distress. Cardiovascular:     Rate and Rhythm: Bradycardia present.  Pulmonary:     Effort: Pulmonary effort is normal. No respiratory distress.  Neurological:     Mental Status: She is alert. She is disoriented.  Psychiatric:        Behavior: Behavior is cooperative.            Vital Signs: BP 135/73 (BP Location: Left Arm)   Pulse (!) 57   Temp 98 F (36.7 C)   Resp 16   Ht 5\' 5"  (1.651 m)   Wt 55.8 kg   SpO2 98%   BMI 20.47 kg/m  SpO2: SpO2: 98 % O2 Device: O2 Device: Room Air O2 Flow Rate:        Palliative Assessment/Data:      Palliative Care Assessment & Plan   Patient Profile: 88 y.o. 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    PMT has been consulted to assist with goals of care conversation regarding nutrition given continued high risk of aspiration with thin liquids. Diet advanced to dysphagia 3/pured meats with thin liquids on 5/4, but downgraded back to dysphagia 1 diet, crushed meds on 5/5.  Assessment: Acute embolic stroke Dysphagia Goals of care conversation  Recommendations/Plan: Continue full code/full scope Continue efforts to arrange family meeting for [place] discussion  PMT will continue to follow   Prognosis:  Guarded  Discharge Planning: To Be Determined    Total time: I spent 25 minutes in the care of the patient today in the above activities and documenting the encounter.   Tillie Viverette Johnnette Litter, PA-C  Palliative Medicine Team Team phone # [phone]  Thank you for allowing the Palliative Medicine Team to assist in the care of this patient. Please utilize secure chat with additional questions, if there is no response within 30 minutes please call the above phone number.  Palliative Medicine Team providers are available by phone from 7am to 7pm daily and can be reached through the team cell phone.  Should this patient require assistance outside of these hours, please call the patient's attending physician.

## 2022-01-20 NOTE — Progress Notes (Signed)
Occupational Therapy Session Note  Patient Details  Name: Patricia Dudley MRN: [id number] Date of Birth: 1929-11-05  Today's Date: 01/20/2022 OT Individual Time: 1005-1105 OT Individual Time Calculation (min): 60 min    Short Term Goals: Week 3:  OT Short Term Goal 1 (Week 3): Pt will consistently complete 1/3 steps of toileting, with no more than min A for balance to promote independence with OOB toileting OT Short Term Goal 2 (Week 3): Pt will attend to task >10 mins to promote sustained attention needed for ADL task OT Short Term Goal 3 (Week 3): Pt will initiate 2/3 steps with donning shirt, with no more than min A and multimodal cues PRN  Skilled Therapeutic Interventions/Progress Updates:    Patient received dressed and sitting upright in recliner chair.  Patient awake and alert.  Hearing aides placed in ears, and patient slightly more responsive - making eye contact, repeating "Good Morning!"  Patient set up at sink to engage in familiar functional tasks.  Skilled OT intervention to address patient's ability to attend and participate in familiar self care tasks for more than a fleeting moment.  Handed washcloth with warm water and gestured to wash face and patient complied with 5 second delay.  Patient able to apply lotion to hands, chapstick to lips, close chap stick container.  Patient with soiled brief so transitioned to bathroom where she did stand step transfer with min assist and increased time.  Sit to stand with set up and min assist to change brief, complete hygiene - patient did not participate to assist in either of these tasks.  Patient handed clean pants and she begin motion of donning - then lost concentration.  Continues to respond best to backward chaining.   Patient left upright in wheelchair with safety belt in place and engaged and call bell in lap.    Therapy Documentation Precautions:  Precautions Precautions: Fall Precaution Comments: aspiration  precautions Restrictions Weight Bearing Restrictions: No  Pain: No indication of pain   Therapy/Group: Individual Therapy  [name] 01/20/2022, 12:43 PM

## 2022-01-20 NOTE — Progress Notes (Signed)
PROGRESS NOTE   Subjective/Complaints:   Pt much more awake this Am- still nonverbal- getting cleaned up from incontinence per NT.    ROS: limited by cognition/nobverbal  Objective:   No results found. No results for input(s): WBC, HGB, HCT, PLT in the last 72 hours.   Recent Labs    01/19/22 0920 01/20/22 0524  NA 145 148*  K 4.2 4.2  CL 119* 122*  CO2 21* 21*  GLUCOSE 92 129*  BUN 47* 53*  CREATININE 2.76* 2.72*  CALCIUM 8.2* 8.2*     Intake/Output Summary (Last 24 hours) at 01/20/2022 1041 Last data filed at 01/20/2022 0913 Gross per 24 hour  Intake 608 ml  Output --  Net 608 ml        Physical Exam: Vital Signs Blood pressure 135/73, pulse (!) 57, temperature 98 F (36.7 C), resp. rate 16, height 5\' 5"  (1.651 m), weight 55.8 kg, SpO2 98 %.     General: awake, but nonverbal- laying on side- locked eyes with me, then looked away; NT in room cleaning pt up;  NAD HENT: ; oropharynx a little dry CV: regular to borderline bradycardic  rate; no JVD Pulmonary: CTA B/L; no W/R/R- good air movement GI: soft, NT, ND, (+)BS Psychiatric: nonverbal Neurological: nonverbal- awake  Musculoskeletal:     Cervical back: Neck supple. Protracted posture    Comments: Limited participation in movement today, squeeze hand: L grip/biceps 5-/5 BUE: 3/5 grip/biceps.  Would not participate in RLE and LLE movement. Skin:    General: Skin is warm and dry.  Neurological:     Mental Status: She is alert.     Comments: Squeezes hands on command, did protrude tongue on command today, made attempt to smile when asked. Minimal verbal output Was able to remain awake and alert during exam today Ambulating ModA with RW. Sit to stand MinA +echolalia Psychiatric:     Comments: Smiling,  +echolalia GU: continent of bladder at times, incontinent of bowel  Assessment/Plan: 1. Functional deficits which require 3+ hours per day  of interdisciplinary therapy in a comprehensive inpatient rehab setting. Physiatrist is providing close team supervision and 24 hour management of active medical problems listed below. Physiatrist and rehab team continue to assess barriers to discharge/monitor patient progress toward functional and medical goals  Care Tool:  Bathing    Body parts bathed by patient: Right arm, Left arm, Chest, Abdomen, Left upper leg, Right lower leg   Body parts bathed by helper: Left lower leg, Right lower leg Body parts n/a: Front perineal area, Buttocks   Bathing assist Assist Level: Maximal Assistance - Patient 24 - 49%     Upper Body Dressing/Undressing Upper body dressing   What is the patient wearing?: Pull over shirt    Upper body assist Assist Level: Moderate Assistance - Patient 50 - 74%    Lower Body Dressing/Undressing Lower body dressing      What is the patient wearing?: Incontinence brief, Pants     Lower body assist Assist for lower body dressing: Moderate Assistance - Patient 50 - 74%     Toileting Toileting    Toileting assist Assist for toileting: Total Assistance -  Patient < 25%     Transfers Chair/bed transfer  Transfers assist     Chair/bed transfer assist level: Moderate Assistance - Patient 50 - 74%     Locomotion Ambulation   Ambulation assist   Ambulation activity did not occur: Safety/medical concerns  Assist level: Minimal Assistance - Patient > 75% Assistive device: Walker-rolling Max distance: 95ft   Walk 10 feet activity   Assist  Walk 10 feet activity did not occur: Safety/medical concerns  Assist level: Minimal Assistance - Patient > 75% Assistive device: Walker-rolling   Walk 50 feet activity   Assist Walk 50 feet with 2 turns activity did not occur: Safety/medical concerns         Walk 150 feet activity   Assist Walk 150 feet activity did not occur: Safety/medical concerns         Walk 10 feet on uneven surface   activity   Assist Walk 10 feet on uneven surfaces activity did not occur: Safety/medical concerns         Wheelchair     Assist Is the patient using a wheelchair?: No   Wheelchair activity did not occur: Safety/medical concerns         Wheelchair 50 feet with 2 turns activity    Assist    Wheelchair 50 feet with 2 turns activity did not occur: Safety/medical concerns       Wheelchair 150 feet activity     Assist  Wheelchair 150 feet activity did not occur: Safety/medical concerns       Blood pressure 135/73, pulse (!) 57, temperature 98 F (36.7 C), resp. rate 16, height 5\' 5"  (1.651 m), weight 55.8 kg, SpO2 98 %.    Medical Problem List and Plan: 1. 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             -patient may  shower             -ELOS/Goals: expected dc 6/7 min A to mod A and Max a for auditory comprehension and Total A for verbal comprehension  Continue CIR- PT, OT and SLP 2.  Anemia: continue Heparin Watson TID. Transfused 1U on 5/15. Post-transfusion hemoglobin trending upward nicely, repeat Monday              -antiplatelet therapy: aspirin 81 mg daily 3. Pain Management: Tylenol prn 4. Mood: LCSW to evaluate and provide emotional support             -antipsychotic agents: n/a 5. Neuropsych: This patient is not capable of making decisions on her own behalf. 6. Skin/Wound Care: Routine skin care checks 7. Dysphagia             -- continue Dysphagia 1 diet with thin liquids.  Crushed meds with applesauce.  SLP evaluation. MBS discussed with SLP and shows high risk of aspiration with thin liquids.              -- Continue nutritional supplements, B12, MVI   8: Paroxysmal atrial fibrillation: continue Lopressor 50 mg BID --home torsemide on hold --no AC due to small bowel AVMs Add magnesium gluconate 250mg  HS    01/20/2022    4:01 AM 01/19/2022    7:34 PM 01/19/2022    3:22 PM   Vitals with BMI  Systolic 892 119   417 408  Diastolic 73 52   52 51  Pulse 57 94   96 86    9: Diabetes mellitus:  CBGs qAC,qHS; monitor intake and activity --continue SSI --increase glipizide back to 10mg  (home dose)- changed timing to after supper due to AM hypoglycemia and elevated CBGs HS Increase Semglee to 11U started HS D/c Ensure between meals D/c HS sliding scale due to AM hypoglycemia.  5/28- Cbgs very labile- 101 this AM, but 200s-300s otherwise- will increase Semglee to 14 (from 12) units and monitor 10: Hypertension: d/c Lisinopril due to worsening kidney function, continue amlodipine 10 mg, Lopressor. Repeat creatinine on Monday given increase in Lisinopril.  5/13 BP controlled today. 5/14 some variability in B/P, with mild elevation, some episodes of bradycardia (HR 46 at 051 today). -D/c lisinopril today since BUN 28->68 and Crt 2.28->3.20, with increase in past 2 days.  Hold lopressor with bradycardia episodes.  Will see how B/P trends on amlodipine 10 mg, may consider adding hydralazine if B/p is elevated.   -now hypotensive, change fluids to NS HS 12 hours -5/22 BP a little stable currently, will continue to monitor 5/27- BP slightly elevated in 150s, however off ACE inhibitor- likely up since beocming more euvolemic. Will monitor for trend.  5/28- BP much better- cont' regimen    01/20/2022    4:01 AM 01/19/2022    7:34 PM 01/19/2022    3:22 PM  Vitals with BMI  Systolic 096 283   662 947  Diastolic 73 52   52 51  Pulse 57 94   96 86   11: Hyperlipidemia: continue rosuvastatin 10 mg 12: Moderate malnutrition: continue supplements 13: Chronic kidney disease stage 3b: follow-up BMP     -5/13 B/P is in the 120's today. Pt had lisinopril increased to 40 mg yesterday for elevated B/P.  Since lisinopril may increase Crt, decrease lisinopril to 20 mg daily today and push po intake and repeat BMP. -5/14 Crt up to 3.2 and BUN up to 68.  Fluid bolus (1L NS) and  continuous fluid (0.45 NS at 75 ml/hr) given for rise in BUN and Crt. -d/c lisinopril (per Dr. Christella Noa) since could be a possible cause of elevated Crt . Cr elevated, nephrology consulted. Urine studies obtained, creatinine improving, continue D5W 5/27- Cr down to 2.75 from 3.01- Renal on board- BUN 47 5/28- BUN up to 53 form 47 but Cr stable at 2.7- per renal- appreciate their assistance.     Latest Ref Rng & Units 01/20/2022    5:24 AM 01/19/2022    9:20 AM 01/18/2022    5:38 AM  BMP  Glucose 70 - 99 mg/dL 129   92   73    BUN 8 - 23 mg/dL 53   47   62    Creatinine 0.44 - 1.00 mg/dL 2.72   2.76   3.10    Sodium 135 - 145 mmol/L 148   145   150    Potassium 3.5 - 5.1 mmol/L 4.2   4.2   4.1    Chloride 98 - 111 mmol/L 122   119   124    CO2 22 - 32 mmol/L 21   21   21     Calcium 8.9 - 10.3 mg/dL 8.2   8.2   8.1      14: Glaucoma: continue latanoprost drops 15: Hypothyroidism: continue Synthroid 16: Diarrhea/loose stools: monitor; getting loperamide 2 mg daily (BID prn) -5/13 Continue current regimen. 17: Bowel and bladder incontinence: start bowel and bladder program 18. Daytime somnolence: [employer] and SW if therapy can be scheduled a little later in the day  and if daughters can be present to help motivate her  5/27- still sleepy- might be worth looking into Ritalin? 19. Bradycardia: discontinue lopressor. 5/27- HR in 40s- just for last 12 hours- will recheck with nursing- if still less than 60, will order EKG  20. History of GI bleed: start protonix.  21. Hypotension: resolved, hemoglobin reviewed and stable 22. Neurogenic bladder: bladder program ordered starting 5/11. 23. Stool incontinence: fiber added to bulk stool 24. Screening for vitamin D deficiency: check vitamin D level on Monday 25. Fatigue:   -5/22 Iron 68, HGB slightly improved to 9.9 26. Elevated chloride: notified nephrology 27. Hypernatremia Na 156: Nephrology consulted. Decrease D5W to 140ml/hr  5/27- Na  145- per Nephrology, reduced to 50cc/hour to maintain euvolemia- and also seen by SLP- now on Nectar thick liquids and D1 diet.   5/28- Na 148 today- has gone back up- will d/w renal to see what the next step would be.    I spent a total of  35  minutes on total care today- >50% coordination of care- due to d/w renal and nursing.         LOS: 17 days A FACE TO FACE EVALUATION WAS PERFORMED  Christian Treadway 01/20/2022, 10:41 AM

## 2022-01-20 NOTE — Progress Notes (Signed)
Patient ID: Patricia Dudley, female   DOB: 03/09/1930, 88 y.o.   MRN: [id number] [facility] Progress Note   Assessment/ Plan:   1.  Hypernatremia: Appears to be subacute versus chronic following her recent hospitalization/CVA.  This is likely potentiated by her suboptimal oral fluid intake.  Sodium levels slightly higher this morning for which I will adjust the rate of D5W as she may have increased insensible losses beyond what is suspected to be her basal requirements of 1200 cc/day.  Oral intake remains suboptimal with restrictions based on swallow evaluation. 2.  Acute kidney injury on chronic kidney disease stage IIIb-stage IV: Appears hemodynamically mediated and partly improved with half-normal saline.  Renal function improving and back towards baseline with ongoing fluid replacement. 3.  Atrial fibrillation: Rate controlled on metoprolol and not a candidate for anticoagulation due to symptomatic arteriovenous malformations. 4.  Aphasia/physical deconditioning status post left basal ganglia/internal capsule CVA  Subjective:   Bradycardia noted on telemetry overnight but found to be false on manual confirmation.   Objective:   BP 135/73 (BP Location: Left Arm)   Pulse (!) 57   Temp 98 F (36.7 C)   Resp 16   Ht 5\' 5"  (1.651 m)   Wt 55.8 kg   SpO2 98%   BMI 20.47 kg/m   Intake/Output Summary (Last 24 hours) at 01/20/2022 1042 Last data filed at 01/20/2022 0913 Gross per 24 hour  Intake 608 ml  Output --  Net 608 ml   Weight change:   Physical Exam: Gen: Getting hygiene care CVS: Pulse regular rhythm, normal rate, S1 and S2 are normal Resp: Diminished breath sounds over bases, no distinct rales or rhonchi Abd: Soft, flat, nontender, bowel sounds normal Ext: No lower extremity edema  Imaging: No results found.  Labs: BMET Recent Labs  Lab 01/16/22 1947 01/17/22 0546 01/17/22 1310 01/17/22 1936 01/18/22 0538 01/19/22 0920 01/20/22 0524  NA 153*  156* 154* 151* 150* 145 148*  K 5.0 4.4 5.3* 4.6 4.1 4.2 4.2  CL 129* 129* 130* 125* 124* 119* 122*  CO2 17* 20* 18* 19* 21* 21* 21*  GLUCOSE 347* 86 217* 321* 73 92 129*  BUN 80* 69* 66* 72* 62* 47* 53*  CREATININE 3.60* 3.21* 3.19* 3.38* 3.10* 2.76* 2.72*  CALCIUM 7.9* 8.3* 8.4* 7.9* 8.1* 8.2* 8.2*  PHOS  --   --   --   --   --   --  3.3   CBC Recent Labs  Lab 01/14/22 0618  WBC 8.5  HGB 9.9*  HCT 31.2*  MCV 88.4  PLT 521*    Medications:     amLODipine  10 mg Oral Daily   aspirin EC  81 mg Oral Daily   Gerhardt's butt cream   Topical QID   glipiZIDE  10 mg Oral QPC supper   heparin  5,000 Units Subcutaneous Q8H   insulin aspart  0-9 Units Subcutaneous TID WC   insulin glargine-yfgn  12 Units Subcutaneous QHS   latanoprost  1 drop Both Eyes QHS   levothyroxine  112 mcg Oral Q0600   magnesium gluconate  250 mg Oral QHS   multivitamin with minerals  1 tablet Oral Daily   nystatin  5 mL Oral QID   pantoprazole  40 mg Oral Daily   polycarbophil  625 mg Oral Daily   vitamin B-12  100 mcg Oral Daily   Elmarie Shiley, MD 01/20/2022, 10:42 AM

## 2022-01-21 DIAGNOSIS — E870 Hyperosmolality and hypernatremia: Secondary | ICD-10-CM

## 2022-01-21 DIAGNOSIS — N1832 Chronic kidney disease, stage 3b: Secondary | ICD-10-CM

## 2022-01-21 DIAGNOSIS — I9589 Other hypotension: Secondary | ICD-10-CM

## 2022-01-21 DIAGNOSIS — E861 Hypovolemia: Secondary | ICD-10-CM

## 2022-01-21 LAB — GLUCOSE, CAPILLARY
Glucose-Capillary: 132 mg/dL — ABNORMAL HIGH (ref 70–99)
Glucose-Capillary: 347 mg/dL — ABNORMAL HIGH (ref 70–99)

## 2022-01-21 LAB — CBC
HCT: 25.2 % — ABNORMAL LOW (ref 36.0–46.0)
Hemoglobin: 8 g/dL — ABNORMAL LOW (ref 12.0–15.0)
MCH: 28.6 pg (ref 26.0–34.0)
MCHC: 31.7 g/dL (ref 30.0–36.0)
MCV: 90 fL (ref 80.0–100.0)
Platelets: 293 10*3/uL (ref 150–400)
RBC: 2.8 MIL/uL — ABNORMAL LOW (ref 3.87–5.11)
RDW: 16 % — ABNORMAL HIGH (ref 11.5–15.5)
WBC: 7 10*3/uL (ref 4.0–10.5)
nRBC: 0 % (ref 0.0–0.2)

## 2022-01-21 LAB — RENAL FUNCTION PANEL
Albumin: 2.5 g/dL — ABNORMAL LOW (ref 3.5–5.0)
Anion gap: 3 — ABNORMAL LOW (ref 5–15)
BUN: 48 mg/dL — ABNORMAL HIGH (ref 8–23)
CO2: 23 mmol/L (ref 22–32)
Calcium: 8.2 mg/dL — ABNORMAL LOW (ref 8.9–10.3)
Chloride: 114 mmol/L — ABNORMAL HIGH (ref 98–111)
Creatinine, Ser: 2.75 mg/dL — ABNORMAL HIGH (ref 0.44–1.00)
GFR, Estimated: 16 mL/min — ABNORMAL LOW (ref >60)
Glucose, Bld: 130 mg/dL — ABNORMAL HIGH (ref 70–99)
Phosphorus: 3.6 mg/dL (ref 2.5–4.6)
Potassium: 4 mmol/L (ref 3.5–5.1)
Sodium: 140 mmol/L (ref 135–145)

## 2022-01-21 NOTE — Progress Notes (Signed)
Occupational Therapy Session Note  Patient Details  Name: Patricia Dudley MRN: [id number] Date of Birth: [birth year]  Today's Date: 01/21/2022 OT Individual Time: 1005-1045 OT Individual Time Calculation (min): 40 min    Short Term Goals: Week 3:  OT Short Term Goal 1 (Week 3): Pt will consistently complete 1/3 steps of toileting, with no more than min A for balance to promote independence with OOB toileting OT Short Term Goal 2 (Week 3): Pt will attend to task >10 mins to promote sustained attention needed for ADL task OT Short Term Goal 3 (Week 3): Pt will initiate 2/3 steps with donning shirt, with no more than min A and multimodal cues PRN  Skilled Therapeutic Interventions/Progress Updates:  Pt received seated in w/c. Therapist said hello and good morning and pt able with extra time to respond by smiling and saying "hello." Pt began doffing gown by reaching with her hands to pull the gown off her arms but required Mod A for completion and attending to the task. Pt completed sink bath seated in w/c. Provided multimodal cues to initiate, terminate, and attend to bathing tasks. Demonstrated difficulty terminating tasks such as washing her face. Pt handed an oral swab and able to bring it to her mouth and brush her teeth for about 2 minutes. Leaned forward to spit in the sink 3x. Pt required extra time to process 1-3 step commands but for the most part was able to follow simple commands for dressing, bathing, and sit to stand. Pt stood w/c > sink with Min A and verbal cues for hand placement. Required Max A to pull pants up. Pt left with chair alarm on, call bell in reach, and all needs within reach. IV still running and intact.   Therapy Documentation Precautions:  Precautions Precautions: Fall Precaution Comments: aspiration precautions Restrictions Weight Bearing Restrictions: No     Therapy/Group: Individual Therapy  Catalina Lunger 01/21/2022, 7:34 AM

## 2022-01-21 NOTE — Progress Notes (Signed)
PROGRESS NOTE   Subjective/Complaints:  Very alert. Working with PT. Remains non-verbal  ROS: limited due to language/communication   Objective:   No results found. Recent Labs    01/21/22 0526  WBC 7.0  HGB 8.0*  HCT 25.2*  PLT 293     Recent Labs    01/20/22 0524 01/21/22 0526  NA 148* 140  K 4.2 4.0  CL 122* 114*  CO2 21* 23  GLUCOSE 129* 130*  BUN 53* 48*  CREATININE 2.72* 2.75*  CALCIUM 8.2* 8.2*     Intake/Output Summary (Last 24 hours) at 01/21/2022 1110 Last data filed at 01/21/2022 0841 Gross per 24 hour  Intake 118 ml  Output --  Net 118 ml        Physical Exam: Vital Signs Blood pressure (!) 158/64, pulse 85, temperature 97.8 F (36.6 C), temperature source Oral, resp. rate 20, height 5\' 5"  (1.651 m), weight 55.8 kg, SpO2 100 %.     Constitutional: No distress . Vital signs reviewed. HEENT: NCAT, EOMI, oral membranes moist Neck: supple Cardiovascular: RRR without murmur. No JVD    Respiratory/Chest: CTA Bilaterally without wheezes or rales. Normal effort    GI/Abdomen: BS +, non-tender, non-distended Ext: no clubbing, cyanosis, or edema Psych: flat but cooperates Musculoskeletal:     Cervical back: Neck supple. Protracted posture    Comments: nods head, follows simple commands. Remains non-verbal. L grip/biceps 5-/5 BUE: 3/5 grip/biceps.  No appreciable RLE and LLE movement. Skin:    General: Skin is warm and dry.  Neurological:       nods head, follows simple commands. Remains non-verbal. L grip/biceps 5-/5, did not move right side for moe. Delayed processing Psychiatric:     Comments: Smiling,  +echolalia    Assessment/Plan: 1. Functional deficits which require 3+ hours per day of interdisciplinary therapy in a comprehensive inpatient rehab setting. Physiatrist is providing close team supervision and 24 hour management of active medical problems listed below. Physiatrist  and rehab team continue to assess barriers to discharge/monitor patient progress toward functional and medical goals  Care Tool:  Bathing    Body parts bathed by patient: Right arm, Left arm, Chest, Abdomen, Left upper leg, Right lower leg   Body parts bathed by helper: Face Body parts n/a: Front perineal area, Buttocks   Bathing assist Assist Level: Maximal Assistance - Patient 24 - 49%     Upper Body Dressing/Undressing Upper body dressing   What is the patient wearing?: Pull over shirt    Upper body assist Assist Level: Moderate Assistance - Patient 50 - 74%    Lower Body Dressing/Undressing Lower body dressing      What is the patient wearing?: Incontinence brief, Pants     Lower body assist Assist for lower body dressing: Moderate Assistance - Patient 50 - 74%     Toileting Toileting    Toileting assist Assist for toileting: Total Assistance - Patient < 25%     Transfers Chair/bed transfer  Transfers assist     Chair/bed transfer assist level: Moderate Assistance - Patient 50 - 74%     Locomotion Ambulation   Ambulation assist   Ambulation activity did not  occur: Safety/medical concerns  Assist level: Minimal Assistance - Patient > 75% Assistive device: Walker-rolling Max distance: 27ft   Walk 10 feet activity   Assist  Walk 10 feet activity did not occur: Safety/medical concerns  Assist level: Minimal Assistance - Patient > 75% Assistive device: Walker-rolling   Walk 50 feet activity   Assist Walk 50 feet with 2 turns activity did not occur: Safety/medical concerns         Walk 150 feet activity   Assist Walk 150 feet activity did not occur: Safety/medical concerns         Walk 10 feet on uneven surface  activity   Assist Walk 10 feet on uneven surfaces activity did not occur: Safety/medical concerns         Wheelchair     Assist Is the patient using a wheelchair?: No   Wheelchair activity did not occur:  Safety/medical concerns         Wheelchair 50 feet with 2 turns activity    Assist    Wheelchair 50 feet with 2 turns activity did not occur: Safety/medical concerns       Wheelchair 150 feet activity     Assist  Wheelchair 150 feet activity did not occur: Safety/medical concerns       Blood pressure (!) 158/64, pulse 85, temperature 97.8 F (36.6 C), temperature source Oral, resp. rate 20, height 5\' 5"  (1.651 m), weight 55.8 kg, SpO2 100 %.    Medical Problem List and Plan: 1. 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             -patient may  shower             -ELOS/Goals: expected dc 6/7 min A to mod A and Max a for auditory comprehension and Total A for verbal comprehension  -Continue CIR therapies including PT, OT, and SLP     2.  Anemia: continue Heparin Irvona TID. Transfused 1U on 5/15. Post-transfusion hemoglobin trending upward nicely, repeat Monday              -antiplatelet therapy: aspirin 81 mg daily 3. Pain Management: Tylenol prn 4. Mood: LCSW to evaluate and provide emotional support             -antipsychotic agents: n/a 5. Neuropsych: This patient is not capable of making decisions on her own behalf. 6. Skin/Wound Care: Routine skin care checks 7. Dysphagia             -- continue Dysphagia 1 diet with thin liquids.  Crushed meds with applesauce.  SLP evaluation. MBS discussed with SLP and shows high risk of aspiration with thin liquids.              -- Continue nutritional supplements, B12, MVI   8: Paroxysmal atrial fibrillation: continue Lopressor 50 mg BID --home torsemide on hold --no AC due to small bowel AVMs Add magnesium gluconate 250mg  HS    01/21/2022    4:44 AM 01/20/2022    7:33 PM 01/20/2022    1:51 PM  Vitals with BMI  Systolic [phone]  Diastolic 64 48 64  Pulse 85 86 79    9: Diabetes mellitus: CBGs qAC,qHS; monitor intake and activity --continue  SSI --increase glipizide back to 10mg  (home dose)- changed timing to after supper due to AM hypoglycemia and elevated CBGs HS Increase Semglee to 11U started HS D/c Ensure between meals D/c HS sliding scale due to  AM hypoglycemia.  5/28- Cbgs very labile- 101 this AM, but 200s-300s otherwise- will increase Semglee to 14 (from 12) units and monitor 5/29- cbgs still labile--semglee just increased--observe today  -may need am dose of semglee 10: Hypertension: d/c Lisinopril due to worsening kidney function, continue amlodipine 10 mg, Lopressor. Repeat creatinine on Monday given increase in Lisinopril.  5/13 BP controlled today. 5/14 some variability in B/P, with mild elevation, some episodes of bradycardia (HR 46 at 051 today). -D/c lisinopril today since BUN 28->68 and Crt 2.28->3.20, with increase in past 2 days.  Hold lopressor with bradycardia episodes.  Will see how B/P trends on amlodipine 10 mg, may consider adding hydralazine if B/p is elevated.   -now hypotensive, change fluids to NS HS 12 hours -5/22 BP a little stable currently, will continue to monitor 5/27- BP slightly elevated in 150s, however off ACE inhibitor- likely up since beocming more euvolemic. Will monitor for trend.  5/29 bp under fair to reasonable control    01/21/2022    4:44 AM 01/20/2022    7:33 PM 01/20/2022    1:51 PM  Vitals with BMI  Systolic 235 573 220  Diastolic 64 48 64  Pulse 85 86 79   11: Hyperlipidemia: continue rosuvastatin 10 mg 12: Moderate malnutrition: continue supplements 13: Chronic kidney disease stage 3b: follow-up BMP     -5/13 B/P is in the 120's today. Pt had lisinopril increased to 40 mg yesterday for elevated B/P.  Since lisinopril may increase Crt, decrease lisinopril to 20 mg daily today and push po intake and repeat BMP. -5/14 Crt up to 3.2 and BUN up to 68.  Fluid bolus (1L NS) and continuous fluid (0.45 NS at 75 ml/hr) given for rise in BUN and Crt. -d/c lisinopril (per Dr.  Christella Noa) since could be a possible cause of elevated Crt . Cr elevated, nephrology consulted. Urine studies obtained, creatinine improving, continue D5W 5/27- Cr down to 2.75 from 3.01- Renal on board- BUN 47 5/29 bun improved to 48, Cr still 2.75--renal has signed off.     Latest Ref Rng & Units 01/21/2022    5:26 AM 01/20/2022    5:24 AM 01/19/2022    9:20 AM  BMP  Glucose 70 - 99 mg/dL 130   129   92    BUN 8 - 23 mg/dL 48   53   47    Creatinine 0.44 - 1.00 mg/dL 2.75   2.72   2.76    Sodium 135 - 145 mmol/L 140   148   145    Potassium 3.5 - 5.1 mmol/L 4.0   4.2   4.2    Chloride 98 - 111 mmol/L 114   122   119    CO2 22 - 32 mmol/L 23   21   21     Calcium 8.9 - 10.3 mg/dL 8.2   8.2   8.2      14: Glaucoma: continue latanoprost drops 15: Hypothyroidism: continue Synthroid 16: Diarrhea/loose stools: monitor; getting loperamide 2 mg daily (BID prn) -continue current regimen. 17: Bowel and bladder incontinence: start bowel and bladder program 18. Daytime somnolence: [employer] and SW if therapy can be scheduled a little later in the day and if daughters can be present to help motivate her  5/27- still sleepy- might be worth looking into Ritalin? 19. Bradycardia: discontinue lopressor. 5/27- HR in 40s- just for last 12 hours- will recheck with nursing- if still less than 60, will order EKG  20. History of GI bleed: start protonix.  21. Hypotension: resolved, hemoglobin reviewed and stable 22. Neurogenic bladder: bladder program ordered starting 5/11. 23. Stool incontinence: fiber added to bulk stool 24. Screening for vitamin D deficiency: check vitamin D level on Monday 25. Fatigue:   -5/22 Iron 68, HGB slightly improved to 9.9 26. Elevated chloride: notified nephrology 27. Hypernatremia Na 156: Nephrology consulted. Decrease D5W to 16ml/hr  5/27- Na 145- per Nephrology, reduced to 50cc/hour to maintain euvolemia- and also seen by SLP- now on Nectar thick liquids and D1 diet.    5/2 Na back to 150 today, renal decreased D5 to 75cc/hr -adjust further fluids based on labs, po intake    -renal has signed off           LOS: 18 days A FACE TO [place] 01/21/2022, 11:10 AM

## 2022-01-21 NOTE — Progress Notes (Signed)
Occupational Therapy Session Note  Patient Details  Name: SHATONIA HOOTS MRN: [id number] Date of Birth: [birth year]  Today's Date: 01/21/2022 OT Individual Time: 1330-1430 OT Individual Time Calculation (min): 60 min    Short Term Goals: Week 1:   Week 2:  OT Short Term Goal 1 (Week 2): Pt will thread LB clothing over both feet with no more than CGA for balance and multimodal cueing as needed OT Short Term Goal 1 - Progress (Week 2): Not met OT Short Term Goal 2 (Week 2): Pt will tolerate 2+ mins in stance during ADL task with no more than min A for balance OT Short Term Goal 2 - Progress (Week 2): Met OT Short Term Goal 3 (Week 2): Pt will attend to ADL task with min A for at least 2 mins to promote functional participation and sustained attention OT Short Term Goal 3 - Progress (Week 2): Met  Skilled Therapeutic Interventions/Progress Updates:    Pt received in room with no pain indicated and daughter present. Pt completes stand pivot transfer to/from Bsc using grab bar with MIN A and multimodal cuing to initiate transfer and hand placement as well as facilitation of lateral weight shifting for stepping feet. Pt able to sit to stand 2x throughout cleansing process with MIN A and eventually able ot pull pants past hips with BUE and MIN A to maintain balance. Pt washes hands at sink with significantly increased time to scan for faucet, cue for sequencing and A to reach for paper towel in max ranges outside BOS. Seated in Dayroom, pt sorts silverware with increased external distractor impacting performance d/t busy nature of environment. Pt able to accurately sort 4/4 pieces with OT handing item to pt, but pt unable to reach to pick it up off the table. Exited session with pt seated in bed, exit alarm on and call light in reach    Therapy Documentation Precautions:  Precautions Precautions: Fall Precaution Comments: aspiration precautions Restrictions Weight Bearing Restrictions:  No General:   Vital Signs:   Therapy/Group: Individual Therapy  Tonny Branch 01/21/2022, 2:42 PM

## 2022-01-21 NOTE — Progress Notes (Signed)
Physical Therapy Session Note  Patient Details  Name: Patricia Dudley MRN: [id number] Date of Birth: 1930-06-18  Today's Date: 01/21/2022 PT Individual Time: 0830-0911 PT Individual Time Calculation (min): 41 min   Short Term Goals: Week 2:  PT Short Term Goal 1 (Week 2): pt will transfer bed<>chair with LRAD and mod A of 1 consistantly PT Short Term Goal 1 - Progress (Week 2): Met PT Short Term Goal 2 (Week 2): pt will transfer sit<>stand with LRAD and mod A of 1 consistantly PT Short Term Goal 2 - Progress (Week 2): Progressing toward goal PT Short Term Goal 3 (Week 2): pt will ambulate 35f with LRAD and mod A of 1 PT Short Term Goal 3 - Progress (Week 2): Met Week 3:  PT Short Term Goal 1 (Week 3): pt will initate supine<>sit with mod A PT Short Term Goal 2 (Week 3): pt will transfer sit<>stand with mod A consistantly PT Short Term Goal 3 (Week 3): pt will ambulate 285fwith LRAD and mod A  Skilled Therapeutic Interventions/Progress Updates:   Received pt semi-reclined in bed asleep. Pt did not awake to verbal or tactile stimuli, but when therapist removed covers. Put in hearing aides with total A but noted pt more non-verbal this morning compared to Friday only stating "bye" at end of session. Session with emphasis on functional mobility/transfers, dressing, toileting, and generalized strengthening and endurance. Pt transferred semi-reclined<>sitting EOB with HOB elevated and max A as pt not initiating any movement to sit up. Scooted to EOB with max A and donned pants and shoes with max A. Pt able to maintain static sitting balance with close supervision with 1 posterior LOB requiring mod A to correct. Pt required x 3 attempts and heavy max A to stand from EOB to pull pants up as pt sitting back down prior to coming to complete stand. Noted pt incontinent of urine; therefore quickly removed brief in standing. Donned clean brief sitting EOB and NT arrived. Pt transferred bed<>TIS WC with  RW and mod A +2, however while transferring had active BM on floor before therapist could pull brief over hips. Increased time spent cleaning then pt [facility] mod A +2 while NT performed peri-care dependently. Pt required max A to remain standing due to numerous attempts to sit down. Nephology MD and rehab MD arrived for brief assessment then donned clean gown with max A. Concluded session with pt sitting in TIS WC, needs within reach, and seatbelt alarm on.   Therapy Documentation Precautions:  Precautions Precautions: Fall Precaution Comments: aspiration precautions Restrictions Weight Bearing Restrictions: No  Therapy/Group: Individual Therapy AnAlfonse AlpersT, DPT  01/21/2022, 6:54 AM

## 2022-01-21 NOTE — Progress Notes (Signed)
Patient ID: Patricia Dudley, female   DOB: [birth year], 88 y.o.   MRN: [id number] [facility] Progress Note   Assessment/ Plan:   1.  Hypernatremia: Appears to be subacute versus chronic following her recent hospitalization/CVA.  This is likely potentiated by her suboptimal oral fluid intake with possible insensible losses.   -Na improved, would continue with D5W until she is able to adequately take in PO. Given that her Na is down to 140, will decrease her rate to 75cc/hr. Can adjust rate depending on her Na and free water deficit. -Nothing else to add from a nephrology perspective at this time, will sign off. Please call with any questions/concerns. 2.  Acute kidney injury on chronic kidney disease stage IIIb-stage IV: Appears hemodynamically mediated and partly improved with half-normal saline.  Renal function improving and back towards baseline (which is ~2) with ongoing fluid replacement. 3.  Atrial fibrillation: Rate controlled on metoprolol and not a candidate for anticoagulation due to symptomatic arteriovenous malformations. 4.  Aphasia/physical deconditioning status post left basal ganglia/internal capsule CVA 5. Anemia: per primary service  Subjective:   No acute events overnight.   Objective:   BP (!) 158/64 (BP Location: Left Arm)   Pulse 85   Temp 97.8 F (36.6 C) (Oral)   Resp 20   Ht 5\' 5"  (1.651 m)   Wt 55.8 kg   SpO2 100%   BMI 20.47 kg/m   Intake/Output Summary (Last 24 hours) at 01/21/2022 1019 Last data filed at 01/21/2022 0841 Gross per 24 hour  Intake 118 ml  Output --  Net 118 ml   Weight change:   Physical Exam: Gen: nad, sitting in wheelchair, nonverbal CVS: Pulse regular rhythm, normal rate, S1 and S2 are normal Resp: Diminished breath sounds over bases, no distinct rales or rhonchi, normal WOB Abd: Soft, flat, nontender, bowel sounds normal Ext: No lower extremity edema  Imaging: No results found.  Labs: BMET Recent Labs  Lab  01/17/22 0546 01/17/22 1310 01/17/22 1936 01/18/22 0538 01/19/22 0920 01/20/22 0524 01/21/22 0526  NA 156* 154* 151* 150* 145 148* 140  K 4.4 5.3* 4.6 4.1 4.2 4.2 4.0  CL 129* 130* 125* 124* 119* 122* 114*  CO2 20* 18* 19* 21* 21* 21* 23  GLUCOSE 86 217* 321* 73 92 129* 130*  BUN 69* 66* 72* 62* 47* 53* 48*  CREATININE 3.21* 3.19* 3.38* 3.10* 2.76* 2.72* 2.75*  CALCIUM 8.3* 8.4* 7.9* 8.1* 8.2* 8.2* 8.2*  PHOS  --   --   --   --   --  3.3 3.6   CBC Recent Labs  Lab 01/21/22 0526  WBC 7.0  HGB 8.0*  HCT 25.2*  MCV 90.0  PLT 293    Medications:     amLODipine  10 mg Oral Daily   aspirin EC  81 mg Oral Daily   Gerhardt's butt cream   Topical QID   glipiZIDE  10 mg Oral QPC supper   heparin  5,000 Units Subcutaneous Q8H   insulin aspart  0-9 Units Subcutaneous TID WC   insulin glargine-yfgn  14 Units Subcutaneous QHS   latanoprost  1 drop Both Eyes QHS   levothyroxine  112 mcg Oral Q0600   magnesium gluconate  250 mg Oral QHS   multivitamin with minerals  1 tablet Oral Daily   nystatin  5 mL Oral QID   pantoprazole  40 mg Oral Daily   polycarbophil  625 mg Oral Daily   vitamin B-12  100 mcg Oral Daily   Gean Quint, MD [facility] 01/21/2022, 10:19 AM

## 2022-01-21 NOTE — Progress Notes (Signed)
Speech Language Pathology Daily Session Note  Patient Details  Name: Patricia Dudley MRN: [id number] Date of Birth: [birth year]  Today's Date: 01/21/2022 SLP Individual Time: 1100-1145 SLP Individual Time Calculation (min): 45 min  Short Term Goals: Week 3: SLP Short Term Goal 1 (Week 3): Pt will follow 5 commands within a functional context given Mod multimodal A. SLP Short Term Goal 2 (Week 3): Pt will communicate wants, needs, ideas, and thoughts x 5 given Min-Mod A and use of mulitmodal communication methods. SLP Short Term Goal 3 (Week 3): Pt will consume pureed textures with minimal oral holding/pocketing given Mod A multimodal cues. SLP Short Term Goal 4 (Week 3): Pt will maintain sustianed attention for 5 minutes with Min A.  Skilled Therapeutic Interventions: Skilled ST services focused on swallow and cognitive skills. Pt was alert during session, making eye contact on several occasion but demonstrated limited functional interaction. SLP presented three dys 1 texture snack options and yes/no communication board. Pt was unable to indicate yes/no or preference of snack despite max A multimodal cues. SLP facilitated PO consumption of dys 1 texture snack, providing total A feeding. Pt initially demonstrated 7 seconds for bolus manipulation decreasing to 5-6 seconds when SLP provided count down/verbal cues. Pt demonstrated very mild oral residue, noted during completion of oral care via suction. Pt required x3 attempts to open oral cavity for oral care on command. Pt demonstrated slight head nod x3 to indicate yes in basic yes/no questions pertaining to biographical information and immediate wants/needs. Pt did not verbalize during the session. SLP attempted a 2 color card sorting task, however pt was only able to initiate command with hand over hand assistance. Pt was left in room with call bell within reach and chair alarm set. Recommend to continue ST services.      Pain Pain  Assessment Faces Pain Scale: No hurt  Therapy/Group: Individual Therapy  Kwadwo Taras  [facility] 01/21/2022, 11:39 AM

## 2022-01-22 LAB — RENAL FUNCTION PANEL
Albumin: 2.6 g/dL — ABNORMAL LOW (ref 3.5–5.0)
Anion gap: 6 (ref 5–15)
BUN: 41 mg/dL — ABNORMAL HIGH (ref 8–23)
CO2: 22 mmol/L (ref 22–32)
Calcium: 8.5 mg/dL — ABNORMAL LOW (ref 8.9–10.3)
Chloride: 109 mmol/L (ref 98–111)
Creatinine, Ser: 2.56 mg/dL — ABNORMAL HIGH (ref 0.44–1.00)
GFR, Estimated: 17 mL/min — ABNORMAL LOW (ref >60)
Glucose, Bld: 132 mg/dL — ABNORMAL HIGH (ref 70–99)
Phosphorus: 3.6 mg/dL (ref 2.5–4.6)
Potassium: 3.9 mmol/L (ref 3.5–5.1)
Sodium: 137 mmol/L (ref 135–145)

## 2022-01-22 LAB — GLUCOSE, CAPILLARY
Glucose-Capillary: 118 mg/dL — ABNORMAL HIGH (ref 70–99)
Glucose-Capillary: 254 mg/dL — ABNORMAL HIGH (ref 70–99)
Glucose-Capillary: 350 mg/dL — ABNORMAL HIGH (ref 70–99)
Glucose-Capillary: 306 mg/dL — ABNORMAL HIGH (ref 70–99)

## 2022-01-22 MED ORDER — VITAMIN D 25 MCG (1000 UNIT) PO TABS
1000.0000 [IU] | ORAL_TABLET | Freq: Every day | ORAL | Status: DC
  Administered 2022-01-22 – 2022-01-31 (×10): 1000 [IU] via ORAL
  Filled 2022-01-22 (×10): qty 1

## 2022-01-22 MED ORDER — INSULIN GLARGINE-YFGN 100 UNIT/ML ~~LOC~~ SOLN
15.0000 [IU] | Freq: Every day | SUBCUTANEOUS | Status: DC
  Administered 2022-01-22: 15 [IU] via SUBCUTANEOUS
  Filled 2022-01-22 (×2): qty 0.15

## 2022-01-22 NOTE — Progress Notes (Signed)
Occupational Therapy Session Note  Patient Details  Name: Patricia Dudley MRN: [id number] Date of Birth: 1930-03-31  Today's Date: 01/22/2022 OT Group Time: 1230-1300 OT Group Time Calculation (min): 30 min   Short Term Goals: Week 1:  OT Short Term Goal 1 (Week 1): Pt will visually attend to R side of body with min A during functional task OT Short Term Goal 1 - Progress (Week 1): Met OT Short Term Goal 2 (Week 1): Pt will follow 1 step command in prep for ADL task, with use of multimodal cues and no more than min A OT Short Term Goal 2 - Progress (Week 1): Met OT Short Term Goal 3 (Week 1): Pt will roll either R or L during bed level toileting, with mod A and use of multimodal cues as needed OT Short Term Goal 3 - Progress (Week 1): Met Week 2:  OT Short Term Goal 1 (Week 2): Pt will thread LB clothing over both feet with no more than CGA for balance and multimodal cueing as needed OT Short Term Goal 1 - Progress (Week 2): Not met OT Short Term Goal 2 (Week 2): Pt will tolerate 2+ mins in stance during ADL task with no more than min A for balance OT Short Term Goal 2 - Progress (Week 2): Met OT Short Term Goal 3 (Week 2): Pt will attend to ADL task with min A for at least 2 mins to promote functional participation and sustained attention OT Short Term Goal 3 - Progress (Week 2): Met Week 3:  OT Short Term Goal 1 (Week 3): Pt will consistently complete 1/3 steps of toileting, with no more than min A for balance to promote independence with OOB toileting OT Short Term Goal 2 (Week 3): Pt will attend to task >10 mins to promote sustained attention needed for ADL task OT Short Term Goal 3 (Week 3): Pt will initiate 2/3 steps with donning shirt, with no more than min A and multimodal cues PRN   Skilled Therapeutic Interventions/Progress Updates:    [employer] group with SLP with focus on self feeding follow diet D1/nectar thick precautions. Pt with improved ability to initiate and  follow through with self feeding; but still requires extra time. Pt did take the entire hr to eat. Pt did need tactile cuing 40% of the time scoop bite onto utensil. Pt continues to need setup of meals; including seperating her liquids from her solids. We started the meal with solids. Pt did demonstrate more timely swallow of bolus than last time OT ate with her.  Pt was taken back to gym via w/c and left with family member in prep for next session.    Therapy Documentation Precautions:  Precautions Precautions: Fall Precaution Comments: aspiration precautions Restrictions Weight Bearing Restrictions: No  Pain:  No c/o pain in session    Therapy/Group: Group Therapy  Willeen Cass [facility] 01/22/2022, 2:52 PM

## 2022-01-22 NOTE — Progress Notes (Signed)
PROGRESS NOTE   Subjective/Complaints: Fluctuates in ability as per therapy CBGs elevated- discussed with nursing increase in Semglee.  Nonverbal this morning  ROS: Limited due to language/communication   Objective:   No results found. Recent Labs    01/21/22 0526  WBC 7.0  HGB 8.0*  HCT 25.2*  PLT 293     Recent Labs    01/21/22 0526 01/22/22 0542  NA 140 137  K 4.0 3.9  CL 114* 109  CO2 23 22  GLUCOSE 130* 132*  BUN 48* 41*  CREATININE 2.75* 2.56*  CALCIUM 8.2* 8.5*     Intake/Output Summary (Last 24 hours) at 01/22/2022 0958 Last data filed at 01/22/2022 0918 Gross per 24 hour  Intake 320 ml  Output --  Net 320 ml        Physical Exam: Vital Signs Blood pressure 134/66, pulse 87, temperature 97.9 F (36.6 C), resp. rate 15, height 5\' 5"  (1.651 m), weight 55.8 kg, SpO2 100 %.     Constitutional: No distress . Vital signs reviewed. HEENT: NCAT, EOMI, oral membranes moist Neck: supple Cardiovascular: RRR without murmur. No JVD    Respiratory/Chest: CTA Bilaterally without wheezes or rales. Normal effort    GI/Abdomen: BS +, non-tender, non-distended Ext: no clubbing, cyanosis, or edema Psych: flat but cooperates Musculoskeletal:     Cervical back: Neck supple. Protracted posture    Comments: nods head, follows simple commands. Remains non-verbal. L grip/biceps 5-/5 BUE: 3/5 grip/biceps.  No appreciable RLE and LLE movement. Skin:    General: Skin is warm and dry.  Neurological:       nods head, follows simple commands. Remains non-verbal. L grip/biceps 5-/5, did not move right side for moe. Delayed processing Stand pivot transfer MinA Psychiatric:     Comments: Smiling,  +echolalia    Assessment/Plan: 1. Functional deficits which require 3+ hours per day of interdisciplinary therapy in a comprehensive inpatient rehab setting. Physiatrist is providing close team supervision and 24 hour  management of active medical problems listed below. Physiatrist and rehab team continue to assess barriers to discharge/monitor patient progress toward functional and medical goals  Care Tool:  Bathing    Body parts bathed by patient: Right arm, Left arm, Chest, Abdomen, Left upper leg, Right lower leg   Body parts bathed by helper: Face Body parts n/a: Front perineal area, Buttocks   Bathing assist Assist Level: Maximal Assistance - Patient 24 - 49%     Upper Body Dressing/Undressing Upper body dressing   What is the patient wearing?: Pull over shirt    Upper body assist Assist Level: Moderate Assistance - Patient 50 - 74%    Lower Body Dressing/Undressing Lower body dressing      What is the patient wearing?: Incontinence brief, Pants     Lower body assist Assist for lower body dressing: Maximal Assistance - Patient 25 - 49%     Toileting Toileting    Toileting assist Assist for toileting: Total Assistance - Patient < 25%     Transfers Chair/bed transfer  Transfers assist     Chair/bed transfer assist level: Moderate Assistance - Patient 50 - 74%     Locomotion Ambulation  Ambulation assist   Ambulation activity did not occur: Safety/medical concerns  Assist level: Minimal Assistance - Patient > 75% Assistive device: Walker-rolling Max distance: 2ft   Walk 10 feet activity   Assist  Walk 10 feet activity did not occur: Safety/medical concerns  Assist level: Minimal Assistance - Patient > 75% Assistive device: Walker-rolling   Walk 50 feet activity   Assist Walk 50 feet with 2 turns activity did not occur: Safety/medical concerns         Walk 150 feet activity   Assist Walk 150 feet activity did not occur: Safety/medical concerns         Walk 10 feet on uneven surface  activity   Assist Walk 10 feet on uneven surfaces activity did not occur: Safety/medical concerns         Wheelchair     Assist Is the patient using  a wheelchair?: No   Wheelchair activity did not occur: Safety/medical concerns         Wheelchair 50 feet with 2 turns activity    Assist    Wheelchair 50 feet with 2 turns activity did not occur: Safety/medical concerns       Wheelchair 150 feet activity     Assist  Wheelchair 150 feet activity did not occur: Safety/medical concerns       Blood pressure 134/66, pulse 87, temperature 97.9 F (36.6 C), resp. rate 15, height 5\' 5"  (1.651 m), weight 55.8 kg, SpO2 100 %.    Medical Problem List and Plan: 1. 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             -patient may  shower             -ELOS/Goals: expected dc 6/7 min A to mod A and Max a for auditory comprehension and Total A for verbal comprehension  -Continue CIR therapies including PT, OT, and SLP  2.  Anemia: continue Heparin [place] TID. Transfused 1U on 5/15. Post-transfusion hemoglobin trending upward nicely, repeat Monday              -antiplatelet therapy: aspirin 81 mg daily 3. Pain Management: Tylenol prn 4. Mood: LCSW to evaluate and provide emotional support             -antipsychotic agents: n/a 5. Neuropsych: This patient is not capable of making decisions on her own behalf. 6. Skin/Wound Care: Routine skin care checks 7. Dysphagia             -- continue Dysphagia 1 diet with thin liquids.  Crushed meds with applesauce.  SLP evaluation. MBS discussed with SLP and shows high risk of aspiration with thin liquids.              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  5/28- Cbgs very labile- 101 this AM, but 200s-300s otherwise- will increase Semglee to 14 (from 12) units and monitor 5/29- cbgs still labile--semglee just increased--observe today  -may need am dose of semglee 10: Hypertension: d/c Lisinopril due to worsening kidney function, continue amlodipine 10 mg, Lopressor. Repeat creatinine on Monday given increase in Lisinopril.  5/13 BP controlled today. 5/14 some variability in B/P, with mild elevation, some episodes of bradycardia (HR 46 at 051 today). -D/c lisinopril today since BUN 28->68 and Crt 2.28->3.20, with increase in past 2 days.  Hold lopressor with bradycardia episodes.  Will see how B/P trends on amlodipine 10 mg, may consider adding hydralazine if B/p is elevated.   -now hypotensive, change fluids to NS HS 12 hours -5/22 BP a little stable currently, will continue to monitor 5/27- BP slightly elevated in 150s, however off ACE inhibitor- likely up since beocming more euvolemic. Will monitor for trend.  5/29 bp under fair to reasonable control    01/22/2022    3:23 AM 01/21/2022    7:55 PM 01/21/2022    2:35 PM  Vitals with BMI  Systolic [phone]  Diastolic 66 68 50  Pulse 87 46 56   11: Hyperlipidemia: continue rosuvastatin 10 mg 12: Moderate malnutrition: continue supplements 13: Chronic kidney disease stage 3b: follow-up BMP     -5/13 B/P is in the 120's today. Pt had lisinopril increased to 40 mg yesterday for elevated B/P.  Since lisinopril may increase Crt, decrease lisinopril to 20 mg daily today and push po intake and repeat BMP. -5/14 Crt up to 3.2 and BUN up to 68.  Fluid bolus (1L NS) and continuous fluid (0.45 NS at 75 ml/hr) given for rise in BUN and Crt. -d/c  lisinopril (per Dr. Christella Noa) since could be a possible cause of elevated Crt . Cr elevated, nephrology consulted. Urine studies obtained, creatinine improving, continue D5W 5/27- Cr down to 2.75 from 3.01- Renal on board- BUN 47 5/29 bun improved to 48, Cr still 2.75--renal has signed off.     Latest Ref Rng & Units 01/22/2022    5:42 AM 01/21/2022    5:26 AM 01/20/2022    5:24 AM  BMP  Glucose 70 - 99 mg/dL 132   130   129    BUN 8 - 23 mg/dL 41   48   53    Creatinine 0.44 - 1.00 mg/dL 2.56   2.75   2.72    Sodium 135 - 145 mmol/L 137   140   148    Potassium 3.5 - 5.1 mmol/L 3.9   4.0   4.2    Chloride 98 - 111 mmol/L 109   114   122    CO2 22 - 32 mmol/L 22   23   21     Calcium 8.9 - 10.3 mg/dL 8.5   8.2   8.2      14: Glaucoma: continue latanoprost drops 15: Hypothyroidism: continue Synthroid 16: Diarrhea/loose stools: monitor; getting loperamide 2 mg daily (BID prn) -continue current regimen. 17: Bowel and bladder incontinence: start bowel and bladder program 18. Daytime somnolence: [employer] and SW if therapy can be scheduled a little later in the day and if daughters can be present to help motivate her  5/27- still sleepy- might be worth looking into Ritalin? 19. Bradycardia: discontinue lopressor. 5/27- HR in 40s- just for last 12 hours- will recheck with nursing- if still less than 60, will order  EKG  20. History of GI bleed: start protonix.  21. Hypotension: resolved, hemoglobin reviewed and stable 22. Neurogenic bladder: bladder program ordered starting 5/11. 23. Stool incontinence: fiber added to bulk stool 24. Screening for vitamin D deficiency: Vitamin D level 50: start daily supplement 25. Fatigue:   -5/22 Iron 68, HGB slightly improved to 9.9. Start vitamin D 1,000U daily 26. Elevated chloride: notified nephrology 27. Hypernatremia: resolved, decrease D5 to 50, repeat Na tomorrow.           LOS: 19 days A FACE TO FACE EVALUATION WAS  PERFORMED  Clide Deutscher Georg Ang 01/22/2022, 9:58 AM

## 2022-01-22 NOTE — Progress Notes (Signed)
Physical Therapy Session Note  Patient Details  Name: Patricia Dudley MRN: [id number] Date of Birth: 19-Mar-1930  Today's Date: 01/22/2022 PT Individual Time: 1410-3013 PT Individual Time Calculation (min): 55 min   Short Term Goals: Week 2:  PT Short Term Goal 1 (Week 2): pt will transfer bed<>chair with LRAD and mod A of 1 consistantly PT Short Term Goal 1 - Progress (Week 2): Met PT Short Term Goal 2 (Week 2): pt will transfer sit<>stand with LRAD and mod A of 1 consistantly PT Short Term Goal 2 - Progress (Week 2): Progressing toward goal PT Short Term Goal 3 (Week 2): pt will ambulate 26f with LRAD and mod A of 1 PT Short Term Goal 3 - Progress (Week 2): Met Week 3:  PT Short Term Goal 1 (Week 3): pt will initate supine<>sit with mod A PT Short Term Goal 2 (Week 3): pt will transfer sit<>stand with mod A consistantly PT Short Term Goal 3 (Week 3): pt will ambulate 254fwith LRAD and mod A  Skilled Therapeutic Interventions/Progress Updates:   Received pt sitting in TIS WC with daughter present at bedside. Session with emphasis on family education training, functional mobility/transfers, generalized strengthening and endurance, simulated car transfers, and gait training. Pt transported to/from room in TIS WC dependently. Pt performed [place] and max A overall (with therapist). Pt required max cues for sequencing when turning and manual assist to steer/control RW. Pt able to get legs in/out of car with min A, but required max A to stand from low car seat height. Pt reported fatigue after transfer, therefore did not perform with daughter but need to practice again tomorrow in family education. Transported to dayroom and stood with RW and light mod A provided by daughter and pt ambulated [id number] RW and min A with therapist providing WC follow and managing IV. Briefly discussed WC options with daughter, who reported not wanting anything "heavy  duty" - plan to trial 16x16 or 18x18 manual WC tomorrow to see if family can manage. Pt's daughter returned completed home measurement sheet and it appears WC will fit through most of the doorways (however, bathroom is 24in). Returned to room and concluded session with pt reclined in TIS WC, needs within reach, and seatbelt alarm on. Daughter present at bedside.   Therapy Documentation Precautions:  Precautions Precautions: Fall Precaution Comments: aspiration precautions Restrictions Weight Bearing Restrictions: No  Therapy/Group: Individual Therapy AnnAlfonse Alpers, DPT  01/22/2022, 7:05 AM

## 2022-01-22 NOTE — Progress Notes (Signed)
Occupational Therapy Session Note  Patient Details  Name: Patricia Dudley MRN: [id number] Date of Birth: [birth year]  Today's Date: 01/22/2022 OT Individual Time: 6073-7106 OT Individual Time Calculation (min): 57 min    Short Term Goals: Week 3:  OT Short Term Goal 1 (Week 3): Pt will consistently complete 1/3 steps of toileting, with no more than min A for balance to promote independence with OOB toileting OT Short Term Goal 2 (Week 3): Pt will attend to task >10 mins to promote sustained attention needed for ADL task OT Short Term Goal 3 (Week 3): Pt will initiate 2/3 steps with donning shirt, with no more than min A and multimodal cues PRN  Skilled Therapeutic Interventions/Progress Updates:    Pt greeted seated in TIS wc asleep, easy to wake. Pt smiled at OT when OT introduced self. OT brought pt to the sink and handed pt her comb. Pt needed hand over hand A to initiate combing task, then she was able to start combing hair for short time, before loosing attention. Worked on visual scanning at the sink to locate items, but pt did not initiate when given extra time. OT asked pt if she wanted OT to braid her hair. Pt smiled so OT braided hair while cuing pt to keep her head/neck upright and look in mirror at the sink. Pt brought to therapy apartment in quiet environment. Worked on folding towels and wash cloths with hand over hand A to initiate. Patient able to fold 4 wash cloths with increased time to imultimodal cues to nitiate and sustain attention to task. OT placed 4 fruits on the counter and asked pt to pick up the orange. Pt initiated and followed this command quickly. However, she was unable to sustain attention and did not follow commands to locate the other fruit items. Pt returned to room and left seated in TIS wc with alarm belt on, call bell in reach, and needs met.   Therapy Documentation Precautions:  Precautions Precautions: Fall Precaution Comments: aspiration  precautions Restrictions Weight Bearing Restrictions: No Pain:  Unable to report.   Therapy/Group: Individual Therapy  Valma Cava 01/22/2022, 9:59 AM

## 2022-01-22 NOTE — Progress Notes (Signed)
Patient ID: Patricia Dudley, female   DOB: 1929-12-03, 88 y.o.   MRN: [id number]  Sw made attempt to contact patient family to schedule family education. SW was able to reach one of patient daughters, Patricia Dudley. She will discuss with other daughters and schedule education.

## 2022-01-22 NOTE — Progress Notes (Signed)
Daily Progress Note   Patient Name: Patricia Dudley       Date: 01/22/2022 DOB: 16-Jul-1930  Age: 88 y.o. MRN#: [id number] Attending Physician: Izora Ribas, MD Primary Care Physician: Tracie Harrier, MD Admit Date: 01/03/2022  Reason for Consultation/Follow-up: Establishing goals of care  Subjective: Medical records reviewed including progress notes, labs.  I called patient's daughter/HCPOA [name] who tells me she has just landed on her flight in [place] and is not able to speak at length.  She confirms she is back in town tomorrow and will be present for family education over the next several days.  She asks that I call her Sister [place] to arrange goals of care discussion while family is in town.  [place] and offered to arrange goals of care conversation around the schedule for family education as we have previously discussed.  Reoriented to emphasis on nutrition as the rationale for PMT involvement for this discussion.  [place] tells me that they will be meeting for education tomorrow from 1-3, Thursday from 10-4, and Saturday from 1-4.  They are agreeable to meet with PMT afterwards and this would work better as family will be coming from out of town to participate.  Tomorrow is preferred as this is the lightest day of education.  Informed [place] that I will discuss with my colleague and she will receive a call tomorrow to confirm.  Questions and concerns addressed. PMT will continue to support holistically.   Length of Stay: 19   Vital Signs: BP 129/63 (BP Location: Left Arm)   Pulse 81   Temp (!) 97.4 F (36.3 C) (Oral)   Resp 16   Ht 5\' 5"  (1.651 m)   Wt 55.8 kg   SpO2 100%   BMI 20.47 kg/m  SpO2: SpO2: 100 % O2 Device: O2 Device: Room Air O2 Flow Rate:         Palliative Assessment/Data:      Palliative Care Assessment & Plan   Patient Profile: 88 y.o. 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  PMT has been consulted to assist with goals of care conversation regarding nutrition given continued high risk of aspiration with thin liquids. Diet advanced to dysphagia 3/pured meats with thin liquids on 5/4, but downgraded back to dysphagia 1 diet, crushed meds on 5/5.   Assessment: Acute embolic stroke Dysphagia Goals of care conversation  Recommendations/Plan: Continue full code/full scope We will discuss CODE STATUS further during family meeting, as patient was DNR during previous admission Patient's daughters are agreeable to meet with PMT for goals of care discussion, prefer tomorrow 5/31 around 3 PM after family education PMT will continue to follow   Prognosis:  Guarded  Discharge Planning: To Be Determined    Total time: I spent 25 minutes in the care of the patient today in the above activities and documenting the encounter.   Glenford Garis Johnnette Litter, PA-C  Palliative Medicine Team Team phone # [phone]  Thank you for allowing the Palliative Medicine Team to assist in the care of this patient. Please utilize secure chat with additional questions, if there is no response within 30 minutes please call the above phone number.  Palliative Medicine Team providers are available by phone from 7am to 7pm daily and can be reached through the team cell phone.  Should this patient require assistance outside of these hours, please call the patient's attending physician.

## 2022-01-22 NOTE — Progress Notes (Signed)
Speech Language Pathology Daily Session Note  Patient Details  Name: Patricia Dudley MRN: [id number] Date of Birth: February 28, 1930  Today's Date: 01/22/2022 SLP Group Time: 1200-1230 SLP Group Time Calculation (min): 30 min  Short Term Goals: Week 3: SLP Short Term Goal 1 (Week 3): Pt will follow 5 commands within a functional context given Mod multimodal A. SLP Short Term Goal 2 (Week 3): Pt will communicate wants, needs, ideas, and thoughts x 5 given Min-Mod A and use of mulitmodal communication methods. SLP Short Term Goal 3 (Week 3): Pt will consume pureed textures with minimal oral holding/pocketing given Mod A multimodal cues. SLP Short Term Goal 4 (Week 3): Pt will maintain sustianed attention for 5 minutes with Min A.  Skilled Therapeutic Interventions: Skilled group treatment in [place] with OT that focused on dysphagia goals and self-feeding. SLP facilitated session by providing Mod A verbal and tactile cues for initiation with self-feeding in a moderately distracting environment for ~60 minutes. Patient consumed her current diet of Dys. 1 textures with nectar-thick liquids without overt s/s of aspiration with decreased time needed for AP transit and increased timeliness of swallow initiation. Recommend patient continue current diet. Patient left upright in wheelchair with family present. Continue with current plan of care.    Pain No/Denies Pain  Therapy/Group: Group Therapy  Jaydin Jalomo 01/22/2022, 2:52 PM

## 2022-01-22 NOTE — Plan of Care (Signed)
  Problem: RH Balance Goal: LTG Patient will maintain dynamic standing balance (PT) Description: LTG:  Patient will maintain dynamic standing balance with assistance during mobility activities (PT) Flowsheets (Taken 01/22/2022 0728) LTG: Pt will maintain dynamic standing balance during mobility activities with:: (downgraded due to fluctuations in mobility due to impaired motor planning/sequencing) Minimal Assistance - Patient > 75% Note: downgraded due to fluctuations in mobility due to impaired motor planning/sequencing   Problem: Sit to Stand Goal: LTG:  Patient will perform sit to stand with assistance level (PT) Description: LTG:  Patient will perform sit to stand with assistance level (PT) Flowsheets (Taken 01/22/2022 0728) LTG: PT will perform sit to stand in preparation for functional mobility with assistance level: (downgraded due to fluctuations in mobility due to impaired motor planning/sequencing) Minimal Assistance - Patient > 75% Note: downgraded due to fluctuations in mobility due to impaired motor planning/sequencing   Problem: RH Bed Mobility Goal: LTG Patient will perform bed mobility with assist (PT) Description: LTG: Patient will perform bed mobility with assistance, with/without cues (PT). Flowsheets (Taken 01/22/2022 0728) LTG: Pt will perform bed mobility with assistance level of: (downgraded due to fluctuations in mobility due to impaired motor planning/sequencing) Moderate Assistance - Patient 50 - 74% Note: downgraded due to fluctuations in mobility due to impaired motor planning/sequencing   Problem: RH Bed to Chair Transfers Goal: LTG Patient will perform bed/chair transfers w/assist (PT) Description: LTG: Patient will perform bed to chair transfers with assistance (PT). Flowsheets (Taken 01/22/2022 0728) LTG: Pt will perform Bed to Chair Transfers with assistance level: (downgraded due to fluctuations in mobility due to impaired motor planning/sequencing) Minimal  Assistance - Patient > 75% Note: downgraded due to fluctuations in mobility due to impaired motor planning/sequencing   Problem: RH Furniture Transfers Goal: LTG Patient will perform furniture transfers w/assist (OT/PT) Description: LTG: Patient will perform furniture transfers  with assistance (OT/PT). Flowsheets (Taken 01/22/2022 0728) LTG: Pt will perform furniture transfers with assist:: (downgraded due to fluctuations in mobility due to impaired motor planning/sequencing) Minimal Assistance - Patient > 75% Note: downgraded due to fluctuations in mobility due to impaired motor planning/sequencing   Problem: RH Ambulation Goal: LTG Patient will ambulate in controlled environment (PT) Description: LTG: Patient will ambulate in a controlled environment, # of feet with assistance (PT). Flowsheets (Taken 01/22/2022 0728) LTG: Pt will ambulate in controlled environ  assist needed:: (downgraded due to fluctuations in mobility due to impaired motor planning/sequencing) Minimal Assistance - Patient > 75% LTG: Ambulation distance in controlled environment: 23ft with LRAD Note: downgraded due to fluctuations in mobility due to impaired motor planning/sequencing Goal: LTG Patient will ambulate in home environment (PT) Description: LTG: Patient will ambulate in home environment, # of feet with assistance (PT). Flowsheets (Taken 01/22/2022 0728) LTG: Pt will ambulate in home environ  assist needed:: (downgraded due to fluctuations in mobility due to impaired motor planning/sequencing) Minimal Assistance - Patient > 75% Note: downgraded due to fluctuations in mobility due to impaired motor planning/sequencing

## 2022-01-22 NOTE — Progress Notes (Signed)
Patient ID: Patricia Dudley, female   DOB: 02-16-1930, 88 y.o.   MRN: [id number]  Patient family education scheduled Wednesday, Thursday and Saturday.

## 2022-01-22 NOTE — Progress Notes (Signed)
Occupational Therapy Session Note  Patient Details  Name: Patricia Dudley MRN: [id number] Date of Birth: [birth year]  Today's Date: 01/22/2022 OT Individual Time: 1601-0932 OT Individual Time Calculation (min): 55 min    Short Term Goals: Week 3:  OT Short Term Goal 1 (Week 3): Pt will consistently complete 1/3 steps of toileting, with no more than min A for balance to promote independence with OOB toileting OT Short Term Goal 2 (Week 3): Pt will attend to task >10 mins to promote sustained attention needed for ADL task OT Short Term Goal 3 (Week 3): Pt will initiate 2/3 steps with donning shirt, with no more than min A and multimodal cues PRN  Skilled Therapeutic Interventions/Progress Updates:  Skilled OT intervention completed with focus on ADL retraining, activity tolerance, motor planning/initiation within functional context. Pt received in R sidelying, requiring multimodal cues to waken, with minimal visual attendance to therapist, requiring therapist to remove covers before pt remained awake. Pt remained non-verbal this session, with only a few grunts stating "no" when asked questions. Required increased time for transitions, with pt demonstrating almost resistive/anxious behaviors, during transfers requiring unreasonable amount of time to transition and heavier assist despite encouragement to complete task as much as possible on her own.  Bed mobility for sidelying to sitting EOB with initial max A fading to supervision for sitting balance. Attempted standing 5X before successful stand with RW and mod A, with pt resisting stand pivot to w/c, however able to complete with max A with physical placement of BLE and RW for safe transfer. Dependently transported into bathroom for toileting with +2 needed at min A level for pt to initiate standing with pt reliant on BUE on grab bar or HHA, with pt found to be heavily incontinent of void only, but unable to void/BM on commode, with pt grunting  "no" when asked if she needed to go to the bathroom. Upon posterior pericare, slight BM present, however pt desiring to get back to chair, with total A needed for toileting and Mod A +2 to TIS chair via stand pivot. Threaded pants with max A, and donned UB shirt with mod A with pt requiring initiation cues after shirt put overhead. Able to initiate combing hair with therapist utilizing hand over hand assist for first 25% of movement then pt following through however with poor sustained attention. Nurse in room for meds. Pt was left seated in TIS chair, with belt alarm on, NT notified of pt readiness for eating breakfast and all needs in reach at end of session.     Therapy Documentation Precautions:  Precautions Precautions: Fall Precaution Comments: aspiration precautions Restrictions Weight Bearing Restrictions: No  Pain: Non-verbal, however smiling and able to shake head no when asked if she had any pain  Therapy/Group: Individual Therapy  Lani Havlik E Jadah Bobak 01/22/2022, 7:30 AM

## 2022-01-23 DIAGNOSIS — Z7189 Other specified counseling: Secondary | ICD-10-CM

## 2022-01-23 DIAGNOSIS — Z515 Encounter for palliative care: Secondary | ICD-10-CM

## 2022-01-23 LAB — RENAL FUNCTION PANEL
Albumin: 2.6 g/dL — ABNORMAL LOW (ref 3.5–5.0)
Anion gap: 6 (ref 5–15)
BUN: 47 mg/dL — ABNORMAL HIGH (ref 8–23)
CO2: 22 mmol/L (ref 22–32)
Calcium: 8.4 mg/dL — ABNORMAL LOW (ref 8.9–10.3)
Chloride: 108 mmol/L (ref 98–111)
Creatinine, Ser: 2.9 mg/dL — ABNORMAL HIGH (ref 0.44–1.00)
GFR, Estimated: 15 mL/min — ABNORMAL LOW (ref >60)
Glucose, Bld: 190 mg/dL — ABNORMAL HIGH (ref 70–99)
Phosphorus: 3.7 mg/dL (ref 2.5–4.6)
Potassium: 4.7 mmol/L (ref 3.5–5.1)
Sodium: 136 mmol/L (ref 135–145)

## 2022-01-23 LAB — GLUCOSE, CAPILLARY
Glucose-Capillary: 183 mg/dL — ABNORMAL HIGH (ref 70–99)
Glucose-Capillary: 257 mg/dL — ABNORMAL HIGH (ref 70–99)
Glucose-Capillary: 240 mg/dL — ABNORMAL HIGH (ref 70–99)
Glucose-Capillary: 329 mg/dL — ABNORMAL HIGH (ref 70–99)

## 2022-01-23 MED ORDER — LORATADINE 10 MG PO TABS
10.0000 mg | ORAL_TABLET | Freq: Every day | ORAL | Status: DC
  Administered 2022-01-23 – 2022-01-30 (×8): 10 mg via ORAL
  Filled 2022-01-23 (×8): qty 1

## 2022-01-23 MED ORDER — INSULIN GLARGINE-YFGN 100 UNIT/ML ~~LOC~~ SOLN
16.0000 [IU] | Freq: Every day | SUBCUTANEOUS | Status: DC
  Administered 2022-01-23: 16 [IU] via SUBCUTANEOUS
  Filled 2022-01-23 (×2): qty 0.16

## 2022-01-23 NOTE — Progress Notes (Addendum)
Occupational Therapy Session Note  Patient Details  Name: Patricia Dudley MRN: [id number] Date of Birth: Jul 12, 1930  Today's Date: 01/23/2022 OT Individual Time: 7741-2878 OT Individual Time Calculation (min): 59 min    Short Term Goals: Week 3:  OT Short Term Goal 1 (Week 3): Pt will consistently complete 1/3 steps of toileting, with no more than min A for balance to promote independence with OOB toileting OT Short Term Goal 2 (Week 3): Pt will attend to task >10 mins to promote sustained attention needed for ADL task OT Short Term Goal 3 (Week 3): Pt will initiate 2/3 steps with donning shirt, with no more than min A and multimodal cues PRN  Skilled Therapeutic Interventions/Progress Updates:  Skilled OT intervention completed with focus on family education with 3 daughters present, regarding toileting and toilet transfers. Pt received seated in TIS chair, with family present, pt smiling upon therapist entry, however non-verbal but no signs of distress or pain. Education provided about the following:  Timed toileting schedule (daytime vs nighttime) Bed level toileting if found incontinent Pericare to prevent infection and brief changing efficiency bed level Functional transfer technique via stand pivot to [facility] in room at mod A level with RW Threading of brief/pants management for toileting on BSC How to manage impulsivity with standing or sitting when unsafe Hand over hand assist methods for placing hands on RW or during transfers/giving pt wash cloth for pericare Body mechanics and positioning for stand pivot transfers Maximizing pt independence with impaired motor planning, slow processing speed and initiation  Throughout session, all 3 daughters provided hands on assist to pt for sit > stands at min A level using RW, min to mod A for stand pivots using RW from w/c <>BSC, threading brief and assisting with toileting at the mod to max A level. Pt was continent of void only, on  commode. Feedback provided to daughters about body positioning, safety precautions, cueing techniques, and how to maximize pt performance. Pt did on several accounts attempt to sit prior to being safely positioned at [facility] or w/c, with therapist jumping in to assist with max level physical placement of hips to seat for safety as pt's daughters demonstrated a little hesitancy. Education provided about options for toileting during different fatigue levels to maximize safety and confidence with transfers especially if alone vs +2 assist.  Pt was left seated in w/c, with next PT in room at direct care handoff.     Therapy Documentation Precautions:  Precautions Precautions: Fall Precaution Comments: aspiration precautions Restrictions Weight Bearing Restrictions: No    Therapy/Group: Individual Therapy  Blase Mess, MS, OTR/L  01/23/2022, 7:47 AM

## 2022-01-23 NOTE — Progress Notes (Signed)
PROGRESS NOTE   Subjective/Complaints: Smiling and stable this morning Aides at bedside assisting with feeding her and they express no concerns Nonverbal  ROS: Limited due to language/communication   Objective:   No results found. Recent Labs    01/21/22 0526  WBC 7.0  HGB 8.0*  HCT 25.2*  PLT 293     Recent Labs    01/22/22 0542 01/23/22 0551  NA 137 136  K 3.9 4.7  CL 109 108  CO2 22 22  GLUCOSE 132* 190*  BUN 41* 47*  CREATININE 2.56* 2.90*  CALCIUM 8.5* 8.4*     Intake/Output Summary (Last 24 hours) at 01/23/2022 1054 Last data filed at 01/22/2022 1822 Gross per 24 hour  Intake 240 ml  Output --  Net 240 ml        Physical Exam: Vital Signs Blood pressure (!) 132/53, pulse 73, temperature 98 F (36.7 C), resp. rate 16, height 5\' 5"  (1.651 m), weight 55.8 kg, SpO2 100 %. Constitutional: No distress . Vital signs reviewed. HEENT: NCAT, EOMI, oral membranes moist Neck: supple Cardiovascular: RRR without murmur. No JVD    Respiratory/Chest: CTA Bilaterally without wheezes or rales. Normal effort    GI/Abdomen: BS +, non-tender, non-distended Ext: no clubbing, cyanosis, or edema Psych: flat but cooperates Musculoskeletal:     Cervical back: Neck supple. Protracted posture    Comments: nods head, follows simple commands. Remains non-verbal. L grip/biceps 5-/5 BUE: 3/5 grip/biceps.  No appreciable RLE and LLE movement. Skin:    General: Skin is warm and dry.  Neurological:       nods head, follows simple commands. Remains non-verbal. L grip/biceps 5-/5, did not move right side for moe. Delayed processing Stand pivot transfer MinA Needs set-up for meals Psychiatric:     Comments: Smiling,  +echolalia    Assessment/Plan: 1. Functional deficits which require 3+ hours per day of interdisciplinary therapy in a comprehensive inpatient rehab setting. Physiatrist is providing close team supervision  and 24 hour management of active medical problems listed below. Physiatrist and rehab team continue to assess barriers to discharge/monitor patient progress toward functional and medical goals  Care Tool:  Bathing    Body parts bathed by patient: Right arm, Left arm, Chest, Abdomen, Left upper leg, Right lower leg   Body parts bathed by helper: Face Body parts n/a: Front perineal area, Buttocks   Bathing assist Assist Level: Maximal Assistance - Patient 24 - 49%     Upper Body Dressing/Undressing Upper body dressing   What is the patient wearing?: Pull over shirt    Upper body assist Assist Level: Moderate Assistance - Patient 50 - 74%    Lower Body Dressing/Undressing Lower body dressing      What is the patient wearing?: Incontinence brief, Pants     Lower body assist Assist for lower body dressing: Maximal Assistance - Patient 25 - 49%     Toileting Toileting    Toileting assist Assist for toileting: Total Assistance - Patient < 25%     Transfers Chair/bed transfer  Transfers assist     Chair/bed transfer assist level: Moderate Assistance - Patient 50 - 74%     Locomotion  Ambulation   Ambulation assist   Ambulation activity did not occur: Safety/medical concerns  Assist level: Minimal Assistance - Patient > 75% Assistive device: Walker-rolling Max distance: 32ft   Walk 10 feet activity   Assist  Walk 10 feet activity did not occur: Safety/medical concerns  Assist level: Minimal Assistance - Patient > 75% Assistive device: Walker-rolling   Walk 50 feet activity   Assist Walk 50 feet with 2 turns activity did not occur: Safety/medical concerns         Walk 150 feet activity   Assist Walk 150 feet activity did not occur: Safety/medical concerns         Walk 10 feet on uneven surface  activity   Assist Walk 10 feet on uneven surfaces activity did not occur: Safety/medical concerns         Wheelchair     Assist Is the  patient using a wheelchair?: No   Wheelchair activity did not occur: Safety/medical concerns         Wheelchair 50 feet with 2 turns activity    Assist    Wheelchair 50 feet with 2 turns activity did not occur: Safety/medical concerns       Wheelchair 150 feet activity     Assist  Wheelchair 150 feet activity did not occur: Safety/medical concerns       Blood pressure (!) 132/53, pulse 73, temperature 98 F (36.7 C), resp. rate 16, height 5\' 5"  (1.651 m), weight 55.8 kg, SpO2 100 %.    Medical Problem List and Plan: 1. 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             -patient may  shower             -ELOS/Goals: expected dc 6/7 min A to mod A and Max a for auditory comprehension and Total A for verbal comprehension  -Continue CIR therapies including PT, OT, and SLP -Interdisciplinary Team Conference today    2.  Anemia: continue Heparin [place] TID. Transfused 1U on 5/15. Post-transfusion hemoglobin trending upward nicely, repeat Monday              -antiplatelet therapy: aspirin 81 mg daily 3. Pain Management: Tylenol prn 4. Mood: LCSW to evaluate and provide emotional support             -antipsychotic agents: n/a 5. Neuropsych: This patient is not capable of making decisions on her own behalf. 6. Skin/Wound Care: Routine skin care checks 7. Dysphagia             -- continue Dysphagia 1 diet with thin liquids.  Crushed meds with applesauce.  SLP evaluation. MBS discussed with SLP and shows high risk of aspiration with thin liquids.              -- Continue nutritional supplements, B12, MVI   8: Paroxysmal atrial fibrillation: continue Lopressor 50 mg BID --home torsemide on hold --no AC due to small bowel AVMs Add magnesium gluconate 250mg  HS    01/23/2022    6:01 AM 01/22/2022    7:50 PM 01/22/2022    1:42 PM  Vitals with BMI  Systolic [phone]  Diastolic 53 80 63  Pulse 73 88 81    9:  Diabetes mellitus: CBGs qAC,qHS; monitor intake and activity --continue SSI --increase glipizide back to 10mg  (home dose)- changed timing to after supper due to AM hypoglycemia and elevated CBGs HS Increase Semglee to 15U started  HS D/c Ensure between meals D/c HS sliding scale due to AM hypoglycemia.  5/28- Cbgs very labile- 101 this AM, but 200s-300s otherwise- will increase Semglee to 14 (from 12) units and monitor 5/29- cbgs still labile--semglee just increased--observe today  -may need am dose of semglee 10: Hypertension: d/c Lisinopril due to worsening kidney function, continue amlodipine 10 mg, Lopressor. Repeat creatinine on Monday given increase in Lisinopril.  5/13 BP controlled today. 5/14 some variability in B/P, with mild elevation, some episodes of bradycardia (HR 46 at 051 today). -D/c lisinopril today since BUN 28->68 and Crt 2.28->3.20, with increase in past 2 days.  Hold lopressor with bradycardia episodes.  Will see how B/P trends on amlodipine 10 mg, may consider adding hydralazine if B/p is elevated.   -now hypotensive, change fluids to NS HS 12 hours -5/22 BP a little stable currently, will continue to monitor 5/27- BP slightly elevated in 150s, however off ACE inhibitor- likely up since beocming more euvolemic. Will monitor for trend.  5/29 bp under fair to reasonable control    01/23/2022    6:01 AM 01/22/2022    7:50 PM 01/22/2022    1:42 PM  Vitals with BMI  Systolic [phone]  Diastolic 53 80 63  Pulse 73 88 81   11: Hyperlipidemia: continue rosuvastatin 10 mg 12: Moderate malnutrition: continue supplements 13: Chronic kidney disease stage 3b: follow-up BMP     -5/13 B/P is in the 120's today. Pt had lisinopril increased to 40 mg yesterday for elevated B/P.  Since lisinopril may increase Crt, decrease lisinopril to 20 mg daily today and push po intake and repeat BMP. -5/14 Crt up to 3.2 and BUN up to 68.  Fluid bolus (1L NS) and continuous fluid (0.45 NS at 75  ml/hr) given for rise in BUN and Crt. -d/c lisinopril (per Dr. Christella Noa) since could be a possible cause of elevated Crt . Cr elevated, nephrology consulted. Urine studies obtained, creatinine improving, continue D5W 5/27- Cr down to 2.75 from 3.01- Renal on board- BUN 47 5/29 bun improved to 48, Cr still 2.75--renal has signed off.     Latest Ref Rng & Units 01/23/2022    5:51 AM 01/22/2022    5:42 AM 01/21/2022    5:26 AM  BMP  Glucose 70 - 99 mg/dL 190   132   130    BUN 8 - 23 mg/dL 47   41   48    Creatinine 0.44 - 1.00 mg/dL 2.90   2.56   2.75    Sodium 135 - 145 mmol/L 136   137   140    Potassium 3.5 - 5.1 mmol/L 4.7   3.9   4.0    Chloride 98 - 111 mmol/L 108   109   114    CO2 22 - 32 mmol/L 22   22   23     Calcium 8.9 - 10.3 mg/dL 8.4   8.5   8.2      14: Glaucoma: continue latanoprost drops 15: Hypothyroidism: continue Synthroid 16: Diarrhea/loose stools: monitor; getting loperamide 2 mg daily (BID prn) -continue current regimen. 17: Bowel and bladder incontinence: start bowel and bladder program 18. Daytime somnolence: [employer] and SW if therapy can be scheduled a little later in the day and if daughters can be present to help motivate her  5/27- still sleepy- might be worth looking into Ritalin? 19. Bradycardia: discontinue lopressor. 5/27- HR in 40s- just for last 12 hours- will  recheck with nursing- if still less than 60, will order EKG  20. History of GI bleed: start protonix.  21. Hypotension: resolved, hemoglobin reviewed and stable 22. Neurogenic bladder: bladder program ordered starting 5/11. 23. Stool incontinence: fiber added to bulk stool 24. Screening for vitamin D deficiency: Vitamin D level 50: start daily supplement 25. Fatigue: Iron reviewed and is 68, HGB slightly improved to 9.9. Start vitamin D 1,000U daily 26. Elevated chloride: resolved, decrease D5 to 57mls/hour 27. Hypernatremia: resolved, decrease D5 to 10, repeat Na tomorrow.            LOS: 20 days A FACE TO FACE EVALUATION WAS PERFORMED  Clide Deutscher Talyssa Gibas 01/23/2022, 10:54 AM

## 2022-01-23 NOTE — Progress Notes (Signed)
Speech Language Pathology Daily Session Note  Patient Details  Name: Patricia Dudley MRN: [id number] Date of Birth: June 07, 1930  Today's Date: 01/23/2022 SLP Individual Time: 1230-1326 SLP Individual Time Calculation (min): 56 min  Short Term Goals: Week 3: SLP Short Term Goal 1 (Week 3): Pt will follow 5 commands within a functional context given Mod multimodal A. SLP Short Term Goal 2 (Week 3): Pt will communicate wants, needs, ideas, and thoughts x 5 given Min-Mod A and use of mulitmodal communication methods. SLP Short Term Goal 3 (Week 3): Pt will consume pureed textures with minimal oral holding/pocketing given Mod A multimodal cues. SLP Short Term Goal 4 (Week 3): Pt will maintain sustianed attention for 5 minutes with Min A.  Skilled Therapeutic Interventions:Skilled ST services focused on education, swallow and communication skills. Two of pt's daughters were present for education. Patricia Dudley (one of the daughters) was feeding pt upon entering room and verbalized appropriate recommended swallow strategies. SLP provided education pertaining to current swallow function with slight improvements in swallow initiation and AP transport noted, but continued impairments in cognitive skills as well as motor planning. Pt demonstrated 2-5 second delay in swallow initiation with pureed textures and noted occasional cough after PO consumption of purees. Pt's daughters reported a nasal drip during PO consumption, post nasal drip possibly resulting in cough? SLP continues to recommend pureed textures and nectar thick liquids, separating solids and liquids (providing liquids first to increase hydration.) SLP demonstrated strategies to increase swallow initiation. SLP provided education pertaining to communication via facial expression and occasional vocal sounds indicating wants/needs when presented with yes/no questions. SLP provided oral care. Pt's daughters asked appropriate questions and continued  education is needed. Pt was left in room with call bell within reach and chair alarm set. SLP recommends to continue skilled services.     Pain Pain Assessment Pain Scale: Faces Pain Score: 0-No pain Faces Pain Scale: No hurt  Therapy/Group: Individual Therapy  Fantashia Shupert  [facility] 01/23/2022, 1:32 PM

## 2022-01-23 NOTE — Progress Notes (Signed)
Inpatient Diabetes Program Recommendations  AACE/ADA: New Consensus Statement on Inpatient Glycemic Control (2015)  Target Ranges:  Prepandial:   less than 140 mg/dL      Peak postprandial:   less than 180 mg/dL (1-2 hours)      Critically ill patients:  140 - 180 mg/dL   Lab Results  Component Value Date   GLUCAP 183 (H) 01/23/2022   HGBA1C 6.5 (H) 12/25/2021    Review of Glycemic Control  Latest Reference Range & Units 01/22/22 11:29 01/22/22 18:30 01/22/22 21:04 01/23/22 05:52  Glucose-Capillary 70 - 99 mg/dL 254 (H) 350 (H) 306 (H) 183 (H)  (H): Data is abnormally high Diabetes history: Type 2 DM Outpatient Diabetes medications: Novolog 0-9 units TID Current orders for Inpatient glycemic control: Novolog 0-9 units TID, Glipizide 10 mg QPM, Semglee 16 units QHS  Inpatient Diabetes Program Recommendations:    Noted changes made to basal insulin this AM. If patient continues to remain elevated above inpatient goals of 180-200 mg/dL would consider adding Novolog 3 units TID (assuming patient is consuming >50% of meals).   Thanks, Bronson Curb, MSN, RNC-OB Diabetes Coordinator [phone] (8a-5p)

## 2022-01-23 NOTE — Patient Care Conference (Signed)
Inpatient RehabilitationTeam Conference and Plan of Care Update Date: 01/23/2022   Time: 11:39 AM    Patient Name: Patricia Dudley      Medical Record Number: [id number]  Date of Birth: 1930-05-22 Sex: Female         Room/Bed: 4W07C/4W07C-01 Payor Info: Payor: [employer] MEDICARE / Plan: [employer] MEDICARE CHOICE PPO / Product Type: *No Product type* /    Admit Date/Time:  01/03/2022  2:52 PM  Primary Diagnosis:  Acute embolic stroke [facility])  Hospital Problems: Principal Problem:   Acute embolic stroke [facility])    Expected Discharge Date: Expected Discharge Date: 01/30/22  Team Members Present: Physician leading conference: Dr. Leeroy Cha Social Worker Present: Erlene Quan, BSW Nurse Present: Dorien Chihuahua, RN PT Present: Ailene Rud, PT OT Present: Jennefer Bravo, OT SLP Present: Charolett Bumpers, SLP PPS Coordinator present : Gunnar Fusi, SLP     Current Status/Progress Goal Weekly Team Focus  Bowel/Bladder   incont x2  regain cont.  assess q shift and prn   Swallow/Nutrition/ Hydration   Dys 1 and Nectar thick liquids, max-mod A  Mod A for consuming least restrictive diet  self-feeding and tolerance of current diet   ADL's   min to max A for bathing/dressing, mod to max A toileting, min to total A feeding and oral care. Heavily fluctuating depending on time of day, task, and from person to person 2/2 impaired motor planning/sequencing/iniation  keeping goals at mod A even though can do some at min A 2/2 fluctuating assist  family hands on training, functional tasks in relation to motor planning/initiation   Mobility   bed mobility max A, sit<>stands min/max A, stand<>pivots min/mod A, gait 53ft with RW and min fading to mod A (+2 for WC follow). Fluctuates due to motor planning/sequencing/initation  CGA/min A  functional mobility/transfers, generalized strengthening and endurance, NMR, dynamic standing balance/coordination, gait training, family educatoin, and  attention/initiation   Communication   Max-Mod A multimodal  Max A for auditory comprehension, Total A for verbal expression  education and multimodal communication   Safety/Cognition/ Behavioral Observations  Max-mod A  Mod A  focused and sustained attention   Pain   no current complaints of pain  pain <3  assess q shift and prn   Skin   skin tear lt buttock  no new breakdown  assess q shift an dprn     Discharge Planning:  discharging home with 5 daughters and 2 sons to assist. Family edu Ladora Daniel, Thursday and Saturday   Team Discussion: Patient on IVF wean. Continue to note issues with motor planning, initiation, communication of needs, and sustained focused attention deficits.  Patient on target to meet rehab goals: no, currently needs max assist for bathing and dressing with hand over hand sequencing cues. Needs min assist for feeding self. Needs max assist for sit - stand and mod assist for stand pivot transfers.   *See Care Plan and progress notes for long and short-term goals.   Revisions to Treatment Plan:  Downgraded PT goals   Teaching Needs: Safety, cues, medications, transfers, toileting, etc  Current Barriers to Discharge: Home enviroment access/layout and Incontinence  Possible Resolutions to Barriers: Family education HH follow up ervices DME: W/C, hospital bed; already has a [facility]     Medical Summary Current Status: N has normalized, Cl has normalized, creatinine has returned to closer to baseline, type 2 DM with severely elevated CBGs  Barriers to Discharge: Medical stability  Barriers to Discharge Comments: severely hypernatremic last  week, severe hyperchloremia last week, high aspiration risk, type 2 DM with severely elevated CBGs, hypertension Possible Resolutions to [employer] Focus: decrease D4 to 50mls/hour, daily renal function panel, increase Semglee to 15U, discuss aspiration risk with daughter, continue amlodipine 10mg    Continued Need for  Acute Rehabilitation Level of Care: The patient requires daily medical management by a physician with specialized training in physical medicine and rehabilitation for the following reasons: Direction of a multidisciplinary physical rehabilitation program to maximize functional independence : Yes Medical management of patient stability for increased activity during participation in an intensive rehabilitation regime.: Yes Analysis of laboratory values and/or radiology reports with any subsequent need for medication adjustment and/or medical intervention. : Yes   I attest that I was present, lead the team conference, and concur with the assessment and plan of the team.   Dorien Chihuahua B 01/23/2022, 4:29 PM

## 2022-01-23 NOTE — Progress Notes (Addendum)
Palliative Medicine Inpatient Follow Up Note  HPI: 88 y.o. 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    PMT has been consulted to assist with goals of care conversation regarding nutrition given continued high risk of aspiration with thin liquids. Diet advanced to dysphagia 3/pured meats with thin liquids on 5/4, but downgraded back to dysphagia 1 diet, crushed meds on 5/5.   Today's Discussion 01/23/2022  *Please note that this is a verbal dictation therefore any spelling or grammatical errors are due to the "[place]" system interpretation.  Chart reviewed inclusive of vital signs, progress notes, laboratory results, and diagnostic images.   I met with Rick and her daughters [place], Juliann Pulse, and Santiago Glad this afternoon.  We had a discussion pertaining to patient's present clinical condition.  Reviewed patient's dementia and how another ischemic event can exacerbate/enhance dementias course.  We reviewed that Kanai has suffered from dementia for quite some time now though after patient's last stroke it seems to become more pronounced.  We reviewed vascular dementia and how this often progresses in a slower/stepwise pattern.  Patient's daughter does endorse that is sometimes difficult when patient does not recognize them.  They also shared that she has some behavioral symptoms that occur from time to time.  Discussed frailty syndrome in older adults is characterized by progressive decline in physiological reserve, increased vulnerability to stressors, and poor clinical outcomes, including recurrent falls and injuries, hospitalization, or progressive  disability.  We reviewed that often as we age are want and desire to eat and drink decrease as our thirst response changes as well as our taste buds.  I shared very honestly that when patients begin to stop eating and drinking sufficiently is often an indicator of long-term prognosis.  Patient's family request more knowledge in terms of patient's aspirational events.  We talked about dysphagia in the setting of having a stroke as well as in the setting of dementia.  We talked about the progression of the disease despite muscle strengthening with speech therapy and cognitive impairments.  I shared with her it starts to become a trend of rehospitalization's due to aspirational events then we really need to question what we are trying to pursue medically in the long run.  From the perspective of cardiopulmonary resuscitation-patient's MPOA [name] had in the past determined a DO NOT RESUSCITATE.  I have tried to call [name] by phone to confirm this though it is running through to voicemail.  Patient's family and I have identified providing additional time to review the idea of resuscitation as a family and to come to the decision by Saturday of this week.  More family members will be present on Saturday therefore an additional meeting will be held to discuss this topic.  Created space and opportunity for family to explore thoughts feelings and fears regarding current medical situation.  Questions and concerns addressed   Palliative Support Provided _____________________________________________ Addendum:  I spoke to patients daughter, [name]. We reviewed the conversation above.  We discussed resuscitation status and [name]'s agrees with a DNAR/DNI.   We have planned to meet on Saturday for additional conversations.   Objective Assessment: Vital Signs Vitals:   01/22/22 1950 01/23/22 0601  BP: 101/80 (!) 132/53  Pulse: 88 73  Resp: 16 16  Temp: 98 F (36.7 C) 98 F (36.7 C)  SpO2: 100% 100%     Intake/Output Summary (Last 24 hours) at 01/23/2022 1554 Last data filed at 01/23/2022 0800 Gross per 24 hour  Intake 480 ml  Output --  Net 480 ml   Last Weight  Most recent update: 01/03/2022  3:26 PM    Weight  55.8 kg (123 lb 0.3 oz)            Gen: Frail elderly African-American female in no acute distress HEENT: moist mucous membranes CV: Regular rate and rhythm PULM: On room air ABD: soft/nontender EXT: No edema Neuro: Somnolent  SUMMARY OF RECOMMENDATIONS   DNAR/DNI Family meeting Saturday --> Time not yet defined OP Palliative support on discharge PMT will continue to follow  Total Time 66  Billing based on MDM: High  Problems Addressed: One acute or chronic illness or injury that poses a threat to life or bodily function  Amount and/or Complexity of Data: Category 3:Discussion of management or test interpretation with external physician/other qualified health care professional/appropriate source (not separately reported)  Risks: Decision not to resuscitate or to de-escalate care because of poor prognosis ______________________________________________________________________________________ [place] Team Team Cell Phone: [phone] Please utilize secure chat with additional questions, if there is no response within 30 minutes please call the above phone number  Palliative Medicine Team providers are available by phone from 7am to 7pm daily and can be reached through the team cell phone.  Should this patient require assistance outside of these hours, please call the patient's attending physician.

## 2022-01-23 NOTE — Progress Notes (Signed)
Physical Therapy Session Note  Patient Details  Name: Patricia Dudley MRN: [id number] Date of Birth: 11/28/1929  Today's Date: 01/23/2022 PT Individual Time: 1430-1455 PT Individual Time Calculation (min): 25 min   Short Term Goals: Week 3:  PT Short Term Goal 1 (Week 3): pt will initate supine<>sit with mod A PT Short Term Goal 2 (Week 3): pt will transfer sit<>stand with mod A consistantly PT Short Term Goal 3 (Week 3): pt will ambulate 45ft with LRAD and mod A  Skilled Therapeutic Interventions/Progress Updates:    Pt received seated in w/c handed off from OT for family education. No indications of pain during session. Three of pt's daughters present for hands-on family education session. Family with questions regarding a car transfer, type of w/c recommended for use upon d/c home, and wanting to review transfers with patient and therapist. Obtained 18x18 standard w/c with cushion for demonstration of how to break down chair for transport in/out of car. Family members take turns attempting to lift w/c and report it is too heavy for use at home, wish to continue to use transport chair for ease of use. Due to time constraints unable to perform car transfer this session. Per OT and family report pt exhibits more fatigue this date as well and requires increased time to complete tasks. Pt agreeable to return to bed. Sit to stand with min A to RW with increased time and cues needed to perform transfer, manual cueing needed to initiate transfer. Stand pivot transfer with RW back to bed with mod A due to pt attempting to sit before reaching the bed. Sit to supine mod A for BLE management. Instructed pt's daughters on how to utilize hospital bed features and chuck pad to scoot pt towards HOB. Pt left seated in bed with needs in reach, bed alarm in place, family present.  Therapy Documentation Precautions:  Precautions Precautions: Fall Precaution Comments: aspiration  precautions Restrictions Weight Bearing Restrictions: No       Therapy/Group: Individual Therapy   [name], PT, DPT, CSRS 01/23/2022, 3:10 PM

## 2022-01-23 NOTE — Progress Notes (Signed)
Occupational Therapy Session Note  Patient Details  Name: Patricia Dudley MRN: [id number] Date of Birth: 05-21-1930  Today's Date: 01/23/2022 OT Individual Time: 8182-9937 OT Individual Time Calculation (min): 60 min    Short Term Goals: Week 3:  OT Short Term Goal 1 (Week 3): Pt will consistently complete 1/3 steps of toileting, with no more than min A for balance to promote independence with OOB toileting OT Short Term Goal 2 (Week 3): Pt will attend to task >10 mins to promote sustained attention needed for ADL task OT Short Term Goal 3 (Week 3): Pt will initiate 2/3 steps with donning shirt, with no more than min A and multimodal cues PRN  Skilled Therapeutic Interventions/Progress Updates:    S: Patient replied "yes," with a smile when she was asked if she liked music. Daughter in room upon therapy arrival and confirmed that there would be family education this afternoon. She was hoping to catch the MD this morning to speak with them.   O: -Toilet transfer: Max assist to initiate using Grab bars and BSC over toilet.  - Patient was dependent for toileting which was performed while standing holding grab bar. - Grooming: Hand washing completed while seated at sink with mod assist. With max cues, patient was able to wet her hands, rub soap on both top and bottom of hands, dry hands with provided paper towel, then throw paper towel in trash (required greater than typical amount of time to complete with max cues). - Color sorting task attempted to work on direction following, motor processing skills in preparation to return home and complete ADL tasks with family.    A: Patient did not void while on toilet which may be the reason why she required more assistance to transfer onto toilet. Patient required max prompts consistently including VC, tactile and visual demonstration. Patient was able to complete sorting task for 3 trials within an extended amount of time with the mentioned prompts.      P: Complete family education to prepare for d/c.   Therapy Documentation Precautions:  Precautions Precautions: Fall Precaution Comments: aspiration precautions Restrictions Weight Bearing Restrictions: No  Pain: Pain Assessment Pain Scale: Faces Pain Score: 0-No pain Faces Pain Scale: No hurt    Therapy/Group: Individual Therapy  Ailene Ravel, OTR/L,CBIS  Supplemental OT - [place] [facility]  01/23/2022, 12:33 PM

## 2022-01-23 NOTE — Progress Notes (Signed)
Patient ID: Patricia Dudley, female   DOB: 05-Jan-1930, 88 y.o.   MRN: [id number]  Hospital bed and bedside commode ordered through [employer]

## 2022-01-23 NOTE — Progress Notes (Signed)
Nutrition Follow-up  DOCUMENTATION CODES:   Non-severe (moderate) malnutrition in context of chronic illness  INTERVENTION:   Continue Multivitamin w/ minerals daily Continue Vital Cuisine Shake BID, each supplement provides 520 kcal and 22 grams of protein Recommend obtaining a new weight due to no new weight in >2 weeks. RN notified.  If within [place], recommend placement of a PEG to meet pt nutritional and fluid needs: If  tube is placed would start Jevity 1.2 @ 20 mL/hr and advance by 10 mL until goal of 60 mL/hr is reached (1440 mL/day) 100 mL free water flushes q6h Provides 1728 kcal, 80 gm protein, and 1162 mL free water (1572 mL total free water) daily.   NUTRITION DIAGNOSIS:   Moderate Malnutrition related to chronic illness as evidenced by moderate fat depletion, percent weight loss, mild muscle depletion. - Ongoing  GOAL:   Patient will meet greater than or equal to 90% of their needs - Progressing   MONITOR:   PO intake, Supplement acceptance, Diet advancement, Labs, Weight trends  REASON FOR ASSESSMENT:   Consult Calorie Count  ASSESSMENT:   88 y.o. female admitted to CIR after an embolic stroke. PMH includes GERD, T2DM, TIA, CVA, HTN, and gastritis.  5/26 - MBS w/ SLP, recommend Dysphagia 1 - Nectar Thick Liquids   Pt with therapy at time of visit, spoke with daughter at bedside.  Discussed with daughter that pt PO intake has been ~50% of tray completion. Discussed alternative means of nutrition with daughter. Daughter is unsure what they would want; current plan is for family meeting with PMT to discuss [place]. Reviewed that pt is on Nectar thick liquids and that liquids can be thickened at home and will be part of education that family receives prior to discharge. Did discuss that some liquids do not thicken well. Reviewed current nutritional supplement that pt is on with daughter and expressed that it is already Nectar thick consistency and drinking 3 per day would  almost meet her daily needs. This RD answered any questions that pt daughter had.   Per EMR, pt PO intake includes: 5/27: Lunch 40%, Dinner 50% 5/28: Breakfast 50%, Lunch 40% 5/29: Breakfast 0%, Lunch 60% 5/30: Breakfast 50%, Dinner 100% 5/31: Breakfast 80%  Medications reviewed and include: Vitamin D3,Glucotrol, SSI, Semglee, Magnesium Gluconate, MVI, Protonix, Fibercon, Vitamin B12 Labs reviewed: BUN 47, Creatinine 2.90, 24 CBG 118-350  Diet Order:   Diet Order             DIET - DYS 1 Room service appropriate? Yes; Fluid consistency: Nectar Thick  Diet effective now                  EDUCATION NEEDS:   Not appropriate for education at this time  Skin:  Skin Assessment: Reviewed RN Assessment  Last BM:  5/29  Height:  Ht Readings from Last 1 Encounters:  01/03/22 5\' 5"  (1.651 m)   Weight:  Wt Readings from Last 1 Encounters:  01/03/22 55.8 kg   Ideal Body Weight:  56.8 kg  BMI:  Body mass index is 20.47 kg/m.  Estimated Nutritional Needs:  Kcal:  1600-1800 Protein:  80-95 grams Fluid:  >/= 1.6 L    Patricia Dudley RD, LDN Clinical Dietitian See [facility] for contact information.

## 2022-01-23 NOTE — Progress Notes (Signed)
Physical Therapy Session Note  Patient Details  Name: Patricia Dudley MRN: [id number] Date of Birth: 04/11/1930  Today's Date: 01/23/2022 PT Individual Time: 0805-0832 PT Individual Time Calculation (min): 27 min   Short Term Goals: Week 3:  PT Short Term Goal 1 (Week 3): pt will initate supine<>sit with mod A PT Short Term Goal 2 (Week 3): pt will transfer sit<>stand with mod A consistantly PT Short Term Goal 3 (Week 3): pt will ambulate 77ft with LRAD and mod A  Skilled Therapeutic Interventions/Progress Updates:    Pt received R sidelying in bed with her gown falling off and easily awakens. Pt appears agreeable to therapy session, smiling back at therapist. Pt remained non-verbal throughout session only saying "uh-hu" once. R sidelying>sitting R EOB with mod/max assist for bringing B LEs over to EOB and bringing trunk upright - pt with persistent R lateral trunk lean requiring mod/max assist to maintain upright until heavy max assist provided to scoot hips forward to reach feet to floor. Sitting EOB requires close supervision up to mod assist for trunk control due to posterior lean with 2x total LOB requiring max assist to recover upright - pt with only a slight balance recovery strategy. Donned shirt with max assist to thread onto arms and dependent assist to pull over head with poor awareness and motor planning how to perform this task. Handed pt pants and she just laid them on top of her lap with poor awareness and no motor planning to initiate threading them on her feet requiring total assist but then pt could initiate pulling them up. Sit>stand EOB>RW with significantly increased time to initiate this task and requiring heavy mod assist for lifting to stand and facilitating increased anterior trunk lean - pt noted to be incontinent of bladder at this time therefore retrieved stedy. Stedy transfer into bathroom. Sit<>stand to/from stedy seat with min assist and from [facility] over toilet with mod  assist. Pt quick to initiate returning back to sitting once in standing requiring max encouragement to remain standing for therapist to perform dependent assist LB clothing management and peri-care. Pt noted to start voiding BM therefore left seated on BSC over toilet with stedy in place and NT present to assume care of pt.  Therapy Documentation Precautions:  Precautions Precautions: Fall Precaution Comments: aspiration precautions Restrictions Weight Bearing Restrictions: No   Pain:  No indication of pain during session.   Therapy/Group: Individual Therapy  Tawana Scale , PT, DPT, NCS, CSRS 01/23/2022, 7:50 AM

## 2022-01-23 NOTE — Progress Notes (Signed)
Patient ID: Patricia Dudley, female   DOB: 1930-08-16, 88 y.o.   MRN: [id number]  Team Conference Report to Patient/Family  Team Conference discussion was reviewed with the patient and caregiver, including goals, any changes in plan of care and target discharge date.  Patient and caregiver express understanding and are in agreement.  The patient has a target discharge date of 01/30/22.  Sw spoke with patient daughter on 5/30. Scheduled family education for today, tomorrow and Saturday. Physician will provide team conference updates to family.  Dyanne Iha 01/23/2022, 1:58 PM

## 2022-01-24 LAB — RENAL FUNCTION PANEL
Albumin: 2.6 g/dL — ABNORMAL LOW (ref 3.5–5.0)
Anion gap: 4 — ABNORMAL LOW (ref 5–15)
BUN: 57 mg/dL — ABNORMAL HIGH (ref 8–23)
CO2: 25 mmol/L (ref 22–32)
Calcium: 8.7 mg/dL — ABNORMAL LOW (ref 8.9–10.3)
Chloride: 112 mmol/L — ABNORMAL HIGH (ref 98–111)
Creatinine, Ser: 3 mg/dL — ABNORMAL HIGH (ref 0.44–1.00)
GFR, Estimated: 14 mL/min — ABNORMAL LOW (ref >60)
Glucose, Bld: 186 mg/dL — ABNORMAL HIGH (ref 70–99)
Phosphorus: 3.4 mg/dL (ref 2.5–4.6)
Potassium: 4.5 mmol/L (ref 3.5–5.1)
Sodium: 141 mmol/L (ref 135–145)

## 2022-01-24 LAB — GLUCOSE, CAPILLARY
Glucose-Capillary: 152 mg/dL — ABNORMAL HIGH (ref 70–99)
Glucose-Capillary: 175 mg/dL — ABNORMAL HIGH (ref 70–99)
Glucose-Capillary: 290 mg/dL — ABNORMAL HIGH (ref 70–99)
Glucose-Capillary: 168 mg/dL — ABNORMAL HIGH (ref 70–99)

## 2022-01-24 MED ORDER — INSULIN GLARGINE-YFGN 100 UNIT/ML ~~LOC~~ SOLN
17.0000 [IU] | Freq: Every day | SUBCUTANEOUS | Status: DC
  Administered 2022-01-24 – 2022-01-30 (×7): 17 [IU] via SUBCUTANEOUS
  Filled 2022-01-24 (×8): qty 0.17

## 2022-01-24 NOTE — Progress Notes (Signed)
Speech Language Pathology Daily Session Note  Patient Details  Name: Patricia Dudley MRN: [id number] Date of Birth: [birth year]  Today's Date: 01/24/2022 SLP Individual Time: 4627-0350 SLP Individual Time Calculation (min): 44 min  Short Term Goals: Week 3: SLP Short Term Goal 1 (Week 3): Pt will follow 5 commands within a functional context given Mod multimodal A. SLP Short Term Goal 2 (Week 3): Pt will communicate wants, needs, ideas, and thoughts x 5 given Min-Mod A and use of mulitmodal communication methods. SLP Short Term Goal 3 (Week 3): Pt will consume pureed textures with minimal oral holding/pocketing given Mod A multimodal cues. SLP Short Term Goal 4 (Week 3): Pt will maintain sustianed attention for 5 minutes with Min A.  Skilled Therapeutic Interventions:Skilled ST services focused on education and swallow skills. Four of pt's daughters were present for education. SLP facilitated education pertaining to current diet pureed textures and nectar thick liquids, providing handout of examples. SLP educated smoothies can be consumed with mixing puree textures and nectar thick liquids (only), with recommendations on increasing the thickness closer to puree than nectar thick. Pt demonstrated oral holding from recently consumed pureed snack, prior to SLP entering. SLP was able to remove majority of the puree with suction and then oral care. Pt's daughter was able to remove upper and lower partials and then SLP completed minimal oral care again, due to pt's reduced ability to keep oral cavity open. SLP educated family on response to yes/no questions providing as much visual aids/demonstrations as possible along with automatic language/singing. Pt was unable to vocalize in singing tasks, but smiled often. All questions answered to satisfaction. Pt was left in room with family, call bell within reach and chair alarm set. SLP recommends to continue skilled services.     Pain Pain Assessment Pain  Score: 0-No pain  Therapy/Group: Individual Therapy  Mayuri Staples  [facility] 01/24/2022, 1:38 PM

## 2022-01-24 NOTE — Progress Notes (Signed)
Occupational Therapy Session Note  Patient Details  Name: Patricia Dudley MRN: [id number] Date of Birth: 1929-11-30  Today's Date: 01/24/2022 OT Individual Time: 1300-1410 OT Individual Time Calculation (min): 70 min    Short Term Goals: Week 3:  OT Short Term Goal 1 (Week 3): Pt will consistently complete 1/3 steps of toileting, with no more than min A for balance to promote independence with OOB toileting OT Short Term Goal 2 (Week 3): Pt will attend to task >10 mins to promote sustained attention needed for ADL task OT Short Term Goal 3 (Week 3): Pt will initiate 2/3 steps with donning shirt, with no more than min A and multimodal cues PRN  Skilled Therapeutic Interventions/Progress Updates:   Pt seen for PM OT session with focus on Family Education. 4 of pt's daughters were present for training in prep for d/c home with family caregiver support next week. OT explained role and plan for session with family based on home set up and needs of care. Pt was resting in Tilt w/c upright upon OT arrival and calm and pleasantly confused through session smiling often. OT reinforced with family that pt tends to require increased time to respond, simple commands and steps of tasks to be broken down. Family requested OT to explain how a transfer board is utilized as they saw the transfer board in the room and were interested. OT demonstrated with w/c to bed but encouraged family to perform transfers with the RW and assist with ambulatory SPT's whenever possible to continue to progress pt's current skills. OT training for toileting routine including use of RW for SPT and allowing pt to respond to 1 step simple commands with promotion of self performance vs doing tasks completely and all demonstrated agreement. OT educated on hand placement, balance and safety, skin integrity and use of simple garments ie pull on clothing. OT completed session with therapeutic activities for HEP carryover including balloon  toss, bubbles, sensory tools such as aromas, SLO-MO ball toss and simple object sorting. At the end of session, pt was fatigued, therefore OT assisted pt back to bed.  OT gauged comfort level with instruction and all family verbalized satisfaction with teaching with no further questions for this clinician at this time. Left session with family present, bed exit on, pt already falling asleep in side lying and needs within reach.   Therapy/Group: Individual Therapy  Barnabas Lister 01/24/2022, 3:24 PM

## 2022-01-24 NOTE — Progress Notes (Signed)
Physical Therapy Session Note  Patient Details  Name: Patricia Dudley MRN: [id number] Date of Birth: [birth year]  Today's Date: 01/24/2022 PT Individual Time: 1000-1056 PT Individual Time Calculation (min): 56 min   Short Term Goals: Week 2:  PT Short Term Goal 1 (Week 2): pt will transfer bed<>chair with LRAD and mod A of 1 consistantly PT Short Term Goal 1 - Progress (Week 2): Met PT Short Term Goal 2 (Week 2): pt will transfer sit<>stand with LRAD and mod A of 1 consistantly PT Short Term Goal 2 - Progress (Week 2): Progressing toward goal PT Short Term Goal 3 (Week 2): pt will ambulate 55f with LRAD and mod A of 1 PT Short Term Goal 3 - Progress (Week 2): Met Week 3:  PT Short Term Goal 1 (Week 3): pt will initate supine<>sit with mod A PT Short Term Goal 2 (Week 3): pt will transfer sit<>stand with mod A consistantly PT Short Term Goal 3 (Week 3): pt will ambulate 26fwith LRAD and mod A  Skilled Therapeutic Interventions/Progress Updates:   Received pt sitting in TIS WC with 2 NTs present to assist with toileting - PT took over with care. Session with emphasis on discharge planning, functional mobility/transfers, toileting, generalized strengthening and endurance, dynamic standing balance/coordination, and stimulated car transfers. Sit<>stand in [place] max A of 1 as pt not initiating any movement to stand. Dependent transfer to toilet with bedside commode over top and required total A for clothing management. Pt able to void and brief dry - pt's 3 daughters arrived for family education. Pt stood from bedside commode in [place] A (poor initiation/motor planning) but therapist unable to get brief/pants over hips without additional assist. Brought pt out into [place]/mod A of 2 and required +2 assist from daughters to pull pants over hips. Pt transported to/from room in TIS WC dependently. Pt performed simulated car transfer with RW and mod A +2  provided by daughters with therapist managing IV pole. Pt required max verbal and tactile cues as well as assist to advance/steer RW. Educated family on body mechanics/positioning, use of gait belt, transfer set up/equipment management, and verbal/tactile cues but especially regarding motor planning deficits and difficulty sequencing. Noted pt's pants wet - returned to room and pt stood with RW and mod A to remove dirty pants. Donned clean pants sitting with max A and stood again with RW and +2 assist provided by daughters to pull pants over hips. Plan for family education again on Saturday. Concluded session with pt sitting in TIS WC, needs within reach, and seatbelt alarm on. Pt's daughters assisting with feeding.   Therapy Documentation Precautions:  Precautions Precautions: Fall Precaution Comments: aspiration precautions Restrictions Weight Bearing Restrictions: No  Therapy/Group: Individual Therapy AnAlfonse AlpersT, DPT  01/24/2022, 7:00 AM

## 2022-01-24 NOTE — Progress Notes (Signed)
Physical Therapy Session Note  Patient Details  Name: Patricia Dudley MRN: [id number] Date of Birth: 28-Oct-1929  Today's Date: 01/24/2022 PT Individual Time: 6803-2122 PT Individual Time Calculation (min): 26 min   Short Term Goals: Week 1:  PT Short Term Goal 1 (Week 1): Pt will roll side to side w/ max A PT Short Term Goal 2 (Week 1): Pt will transfer sup to sit w/ max A PT Short Term Goal 3 (Week 1): Pt will transfer sit to stand w/ max A PT Short Term Goal 4 (Week 1): Pt will amb w/ LRAD x 10'. Week 2:  PT Short Term Goal 1 (Week 2): pt will transfer bed<>chair with LRAD and mod A of 1 consistantly PT Short Term Goal 1 - Progress (Week 2): Met PT Short Term Goal 2 (Week 2): pt will transfer sit<>stand with LRAD and mod A of 1 consistantly PT Short Term Goal 2 - Progress (Week 2): Progressing toward goal PT Short Term Goal 3 (Week 2): pt will ambulate 50f with LRAD and mod A of 1 PT Short Term Goal 3 - Progress (Week 2): Met Week 3:  PT Short Term Goal 1 (Week 3): pt will initate supine<>sit with mod A PT Short Term Goal 2 (Week 3): pt will transfer sit<>stand with mod A consistantly PT Short Term Goal 3 (Week 3): pt will ambulate 226fwith LRAD and mod A Week 4:     Skilled Therapeutic Interventions/Progress Updates:   Pt received supine in bed asleep. Aroused with effort from PT. Supine>sit transfer with total assist and cues  for log roll technique. Sitting balance EOB total A to prevent LOB to the L initially while MD present for assessment. Donning shift EOB with max assist for LUE management. Sitting balance progressed to supervision assist after 5 minutes. Sit<>stand x 5 throughout session to doff wet brief and don clean as well as for RN to place sacral dressing. [facility] from PT for facilitation of forward movement and AD control in turn. Pt left sitting in WC with call bell in reach and all needs met.      Therapy  Documentation Precautions:  Precautions Precautions: Fall Precaution Comments: aspiration precautions Restrictions Weight Bearing Restrictions: No   Pain: Faces: none.    Therapy/Group: Individual Therapy  AuLorie Phenix/08/2021, 9:05 AM

## 2022-01-24 NOTE — Progress Notes (Signed)
PROGRESS NOTE   Subjective/Complaints: No new complaints expressed this morning by patient or Patricia Dudley Had conversation with daughter yesterday regarding diet  ROS: Limited due to language/communication   Objective:   No results found. No results for input(s): WBC, HGB, HCT, PLT in the last 72 hours.    Recent Labs    01/23/22 0551 01/24/22 0525  NA 136 141  K 4.7 4.5  CL 108 112*  CO2 22 25  GLUCOSE 190* 186*  BUN 47* 57*  CREATININE 2.90* 3.00*  CALCIUM 8.4* 8.7*     Intake/Output Summary (Last 24 hours) at 01/24/2022 0933 Last data filed at 01/23/2022 1900 Gross per 24 hour  Intake 240 ml  Output --  Net 240 ml        Physical Exam: Vital Signs Blood pressure (!) 147/98, pulse 88, temperature 98.5 F (36.9 C), temperature source Oral, resp. rate 16, height 5\' 5"  (1.651 m), weight 60.8 kg, SpO2 100 %. Constitutional: No distress . Vital signs reviewed. BMI 22.32 HEENT: NCAT, EOMI, oral membranes moist Neck: supple Cardiovascular: RRR without murmur. No JVD    Respiratory/Chest: CTA Bilaterally without wheezes or rales. Normal effort    GI/Abdomen: BS +, non-tender, non-distended Ext: no clubbing, cyanosis, or edema Psych: flat but cooperates Musculoskeletal:     Cervical back: Neck supple. Protracted posture    Comments: nods head, follows simple commands. Remains non-verbal. L grip/biceps 5-/5 BUE: 3/5 grip/biceps.  No appreciable RLE and LLE movement. Skin:    General: Skin is warm and dry.  Neurological:       nods head, follows simple commands. Remains non-verbal. L grip/biceps 5-/5, did not move right side for moe. Delayed processing Stand pivot transfer MinA Needs set-up for meals Psychiatric:     Comments: Smiling,  +echolalia    Assessment/Plan: 1. Functional deficits which require 3+ hours per day of interdisciplinary therapy in a comprehensive inpatient rehab setting. Physiatrist is  providing close team supervision and 24 hour management of active medical problems listed below. Physiatrist and rehab team continue to assess barriers to discharge/monitor patient progress toward functional and medical goals  Care Tool:  Bathing    Body parts bathed by patient: Right arm, Left arm, Chest, Abdomen, Left upper leg, Right lower leg   Body parts bathed by helper: Face Body parts n/a: Front perineal area, Buttocks   Bathing assist Assist Level: Maximal Assistance - Patient 24 - 49%     Upper Body Dressing/Undressing Upper body dressing   What is the patient wearing?: Pull over shirt    Upper body assist Assist Level: Moderate Assistance - Patient 50 - 74%    Lower Body Dressing/Undressing Lower body dressing      What is the patient wearing?: Incontinence brief, Pants     Lower body assist Assist for lower body dressing: Maximal Assistance - Patient 25 - 49%     Toileting Toileting    Toileting assist Assist for toileting: Moderate Assistance - Patient 50 - 74%     Transfers Chair/bed transfer  Transfers assist     Chair/bed transfer assist level: Moderate Assistance - Patient 50 - 74%     Locomotion Ambulation  Ambulation assist   Ambulation activity did not occur: Safety/medical concerns  Assist level: Minimal Assistance - Patient > 75% Assistive device: Walker-rolling Max distance: 97ft   Walk 10 feet activity   Assist  Walk 10 feet activity did not occur: Safety/medical concerns  Assist level: Minimal Assistance - Patient > 75% Assistive device: Walker-rolling   Walk 50 feet activity   Assist Walk 50 feet with 2 turns activity did not occur: Safety/medical concerns         Walk 150 feet activity   Assist Walk 150 feet activity did not occur: Safety/medical concerns         Walk 10 feet on uneven surface  activity   Assist Walk 10 feet on uneven surfaces activity did not occur: Safety/medical concerns          Wheelchair     Assist Is the patient using a wheelchair?: No   Wheelchair activity did not occur: Safety/medical concerns         Wheelchair 50 feet with 2 turns activity    Assist    Wheelchair 50 feet with 2 turns activity did not occur: Safety/medical concerns       Wheelchair 150 feet activity     Assist  Wheelchair 150 feet activity did not occur: Safety/medical concerns       Blood pressure (!) 147/98, pulse 88, temperature 98.5 F (36.9 C), temperature source Oral, resp. rate 16, height 5\' 5"  (1.651 m), weight 60.8 kg, SpO2 100 %.    Medical Problem List and Plan: 1. 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             -patient may  shower             -ELOS/Goals: expected dc 6/7 min A to mod A and Max a for auditory comprehension and Total A for verbal comprehension  -Continue CIR therapies including Dudley, OT, and SLP 2.  Anemia: continue Heparin  TID. Transfused 1U on 5/15. Post-transfusion hemoglobin trending upward nicely, repeat Monday              -antiplatelet therapy: aspirin 81 mg daily 3. Pain Management: Tylenol prn 4. Mood: LCSW to evaluate and provide emotional support             -antipsychotic agents: n/a 5. Neuropsych: This patient is not capable of making decisions on her own behalf. 6. Skin/Wound Care: Routine skin care checks 7. Dysphagia             -- continue Dysphagia 1 diet with thin liquids.  Crushed meds with applesauce.  SLP evaluation. MBS discussed with SLP and shows high risk of aspiration with thin liquids. Discussed with daughter and SLP thickened smoothies at home for good nutrition             -- Continue nutritional supplements, B12, MVI   8: Paroxysmal atrial fibrillation: continue Lopressor 50 mg BID --home torsemide on hold --no AC due to small bowel AVMs Add magnesium gluconate 250mg  HS    01/24/2022    4:32 AM 01/23/2022    7:35 PM 01/23/2022     6:00 PM  Vitals with BMI  Weight   134 lbs 2 oz  BMI   10.27  Systolic 253 664   Diastolic 98 63   Pulse 88 101     9: Diabetes mellitus: CBGs qAC,qHS; monitor intake and activity --continue SSI --increase glipizide back to 10mg  (home dose)- changed  timing to after supper due to AM hypoglycemia and elevated CBGs HS Increase Semglee to 15U started HS D/c Ensure between meals D/c HS sliding scale due to AM hypoglycemia.  5/28- Cbgs very labile- 101 this AM, but 200s-300s otherwise- will increase Semglee to 14 (from 12) units and monitor 5/29- cbgs still labile--semglee just increased--observe today  -may need am dose of semglee 10: Hypertension: d/c Lisinopril due to worsening kidney function, continue amlodipine 10 mg, Lopressor. Repeat creatinine on Monday given increase in Lisinopril.  5/13 BP controlled today. 5/14 some variability in B/P, with mild elevation, some episodes of bradycardia (HR 46 at 051 today). -D/c lisinopril today since BUN 28->68 and Crt 2.28->3.20, with increase in past 2 days.  Hold lopressor with bradycardia episodes.  Will see how B/P trends on amlodipine 10 mg, may consider adding hydralazine if B/p is elevated.   -now hypotensive, change fluids to NS HS 12 hours -5/22 BP a little stable currently, will continue to monitor 5/27- BP slightly elevated in 150s, however off ACE inhibitor- likely up since beocming more euvolemic. Will monitor for trend.  5/29 bp under fair to reasonable control    01/24/2022    4:32 AM 01/23/2022    7:35 PM 01/23/2022    6:00 PM  Vitals with BMI  Weight   134 lbs 2 oz  BMI   98.33  Systolic 825 053   Diastolic 98 63   Pulse 88 101    11: Hyperlipidemia: continue rosuvastatin 10 mg 12: Moderate malnutrition: continue supplements 13: Chronic kidney disease stage 3b: follow-up BMP     -5/13 B/P is in the 120's today. Dudley had lisinopril increased to 40 mg yesterday for elevated B/P.  Since lisinopril may increase Crt, decrease  lisinopril to 20 mg daily today and push po intake and repeat BMP. -5/14 Crt up to 3.2 and BUN up to 68.  Fluid bolus (1L NS) and continuous fluid (0.45 NS at 75 ml/hr) given for rise in BUN and Crt. -d/c lisinopril (per Dr. Christella Noa) since could be a possible cause of elevated Crt . Cr elevated, nephrology consulted. Urine studies obtained, creatinine improving, continue D5W 5/27- Cr down to 2.75 from 3.01- Renal on board- BUN 47 5/29 bun improved to 48, Cr still 2.75--renal has signed off.     Latest Ref Rng & Units 01/24/2022    5:25 AM 01/23/2022    5:51 AM 01/22/2022    5:42 AM  BMP  Glucose 70 - 99 mg/dL 186   190   132    BUN 8 - 23 mg/dL 57   47   41    Creatinine 0.44 - 1.00 mg/dL 3.00   2.90   2.56    Sodium 135 - 145 mmol/L 141   136   137    Potassium 3.5 - 5.1 mmol/L 4.5   4.7   3.9    Chloride 98 - 111 mmol/L 112   108   109    CO2 22 - 32 mmol/L 25   22   22     Calcium 8.9 - 10.3 mg/dL 8.7   8.4   8.5      14: Glaucoma: continue latanoprost drops 15: Hypothyroidism: continue Synthroid 16: Diarrhea/loose stools: monitor; getting loperamide 2 mg daily (BID prn) -continue current regimen. 17: Bowel and bladder incontinence: start bowel and bladder program 18. Daytime somnolence: [employer] and SW if therapy can be scheduled a little later in the day and if daughters can  be present to help motivate her  5/27- still sleepy- might be worth looking into Ritalin? 19. Bradycardia: discontinue lopressor. 5/27- HR in 40s- just for last 12 hours- will recheck with nursing- if still less than 60, will order EKG  20. History of GI bleed: start protonix.  21. Hypotension: resolved, hemoglobin reviewed and stable 22. Neurogenic bladder: bladder program ordered starting 5/11. 23. Stool incontinence: fiber added to bulk stool 24. Screening for vitamin D deficiency: Vitamin D level 50: start daily supplement 25. Fatigue: Iron reviewed and is 68, HGB slightly improved to 9.9. Start  vitamin D 1,000U daily 26. Elevated chloride: resolved, continue D5 to 64mls/hour 27. Hypernatremia: resolved, decrease D5 to 10, repeat Na tomorrow.           LOS: 21 days A FACE TO FACE EVALUATION WAS PERFORMED  Clide Deutscher Saifullah Jolley 01/24/2022, 9:33 AM

## 2022-01-25 LAB — GLUCOSE, CAPILLARY
Glucose-Capillary: 110 mg/dL — ABNORMAL HIGH (ref 70–99)
Glucose-Capillary: 243 mg/dL — ABNORMAL HIGH (ref 70–99)
Glucose-Capillary: 291 mg/dL — ABNORMAL HIGH (ref 70–99)
Glucose-Capillary: 325 mg/dL — ABNORMAL HIGH (ref 70–99)

## 2022-01-25 LAB — BASIC METABOLIC PANEL
Anion gap: 8 (ref 5–15)
BUN: 58 mg/dL — ABNORMAL HIGH (ref 8–23)
CO2: 25 mmol/L (ref 22–32)
Calcium: 8.8 mg/dL — ABNORMAL LOW (ref 8.9–10.3)
Chloride: 111 mmol/L (ref 98–111)
Creatinine, Ser: 2.99 mg/dL — ABNORMAL HIGH (ref 0.44–1.00)
GFR, Estimated: 14 mL/min — ABNORMAL LOW (ref >60)
Glucose, Bld: 120 mg/dL — ABNORMAL HIGH (ref 70–99)
Potassium: 4.9 mmol/L (ref 3.5–5.1)
Sodium: 144 mmol/L (ref 135–145)

## 2022-01-25 NOTE — Progress Notes (Signed)
Speech Language Pathology Weekly Progress and Session Note  Patient Details  Name: Patricia Dudley MRN: [id number] Date of Birth: 09-22-1929  Beginning of progress report period: Jan 18, 2022 End of progress report period: January 25, 2022  Short Term Goals: Week 3: SLP Short Term Goal 1 (Week 3): Pt will follow 5 commands within a functional context given Mod multimodal A. SLP Short Term Goal 1 - Progress (Week 3): Not met SLP Short Term Goal 2 (Week 3): Pt will communicate wants, needs, ideas, and thoughts x 5 given Min-Mod A and use of mulitmodal communication methods. SLP Short Term Goal 2 - Progress (Week 3): Not met SLP Short Term Goal 3 (Week 3): Pt will consume pureed textures with minimal oral holding/pocketing given Mod A multimodal cues. SLP Short Term Goal 3 - Progress (Week 3): Met SLP Short Term Goal 4 (Week 3): Pt will maintain sustianed attention for 5 minutes with Min A. SLP Short Term Goal 4 - Progress (Week 3): Met    New Short Term Goals: Week 4: SLP Short Term Goal 1 (Week 4): STG=LTG due to ELOS (6/7)  Weekly Progress Updates: Pt made moderate progress meeting 2 out 4 goals. Pt is able to demonstrate ability to express wants/needs via facial expressions and head nods. Pt demonstrated sustained attention to functional tasks for 5 minutes and is ability to self feed with max-mod A. Pt demonstrates occasional, but overall reduced oral holding on pureed textures and nectar thick liquids. Education with multiple family members has begun with handouts given pertaining to diet and demonstration of multimodal communication methods. Continued education is needed and family is asking appropriate questions. Pt would continue to benefit from skilled ST services in order to maximize functional independence and reduce burden of care, requiring 24 hour supervision at discharge with continued skilled ST services.      Intensity: Minumum of 1-2 x/day, 30 to 90 minutes Frequency: 3 to  5 out of 7 days Duration/Length of Stay: 01/30/22 Treatment/Interventions: Speech/Language facilitation;Patient/family education;Multimodal communication approach;Therapeutic Activities;Dysphagia/aspiration precaution training;Functional tasks;Cognitive remediation/compensation;Internal/external aids;Cueing hierarchy;Environmental controls  Talin Feister 01/25/2022, 2:57 PM

## 2022-01-25 NOTE — Progress Notes (Signed)
Occupational Therapy Session Note  Patient Details  Name: Patricia Dudley MRN: [id number] Date of Birth: [birth year]  Today's Date: 01/25/2022 OT Group Time: 1230-1300 OT Group Time Calculation (min): 30 min   Short Term Goals: Week 4:  OT Short Term Goal 1 (Week 4): STG + LTG 2/2 ELOS  Skilled Therapeutic Interventions/Progress Updates:    [employer] group with SLP with focus on self feeding follow diet D1/nectar thick precautions. Pt with improved ability to initiate and follow through with self feeding; but still requires extra time. Pt did take the entire hr to eat. Pt did need tactile cuing 50% of the time scoop bite onto utensil. Pt continues to need setup of meals; including seperating her liquids from her solids. We started the meal with liquids and ended with liquids.    Pt was taken back to room via w/c and left with family member in prep for next session.    Therapy Documentation Precautions:  Precautions Precautions: Fall Precaution Comments: aspiration precautions Restrictions Weight Bearing Restrictions: No  Pain:  No indications of pain  Therapy/Group: Group Therapy  Willeen Cass [facility] 01/25/2022, 3:26 PM

## 2022-01-25 NOTE — Progress Notes (Signed)
Occupational Therapy Weekly Progress Note  Patient Details  Name: Patricia Dudley MRN: [id number] Date of Birth: 07/27/1930  Beginning of progress report period: Jan 18, 2022 End of progress report period: January 25, 2022  Patient has met 1 of 3 short term goals.  Pt is making stagnant progress towards LTGs. Pt remains at an inconsistent level with her abilities depending on fatigue, arousal and participation levels, however overall has progressed functional transfers from mod A +2 stand pivot using RW to min-mod A of 1 using RW for short ambulatory transfers, sometimes requiring a 2nd assist for safety, is able to bathe at an overall mod A level, dress at an overall mod to max A level and requires mod to max assist with sometimes 2nd assist for safety for toileting tasks. Though physical abilities are present, the challenge with impaired motor planning, sequencing, awareness and initiation cause pt to require max to total A multimodal cueing to complete all basic ADLs, including visual, hand over hand and repetitive gestures to be successful. Pt's family has completed several hands on training sessions, however have demonstrated hesitancy during self-care sessions with assisting pt and would benefit from further sessions to become more confident and knowledgeable about assisting pt once home, especially as pt fluctuates with required assist level. OT has spent time addressing bed level and OOB toileting, functional transfers, bathing/dressing recommendations and safety techniques.   Patient continues to demonstrate the following deficits: muscle weakness, decreased cardiorespiratoy endurance, impaired timing and sequencing, decreased coordination, and decreased motor planning, sustained attention, decreased initiation, decreased attention, decreased awareness, decreased problem solving, decreased safety awareness, decreased memory, and delayed processing, and decreased sitting balance and decreased  standing balance and therefore will continue to benefit from skilled OT intervention to enhance overall performance with BADL and Reduce care partner burden.  Patient progressing toward long term goals..  Continue plan of care.  OT Short Term Goals Week 1:  OT Short Term Goal 1 (Week 1): Pt will visually attend to R side of body with min A during functional task OT Short Term Goal 1 - Progress (Week 1): Met OT Short Term Goal 2 (Week 1): Pt will follow 1 step command in prep for ADL task, with use of multimodal cues and no more than min A OT Short Term Goal 2 - Progress (Week 1): Met OT Short Term Goal 3 (Week 1): Pt will roll either R or L during bed level toileting, with mod A and use of multimodal cues as needed OT Short Term Goal 3 - Progress (Week 1): Met Week 2:  OT Short Term Goal 1 (Week 2): Pt will thread LB clothing over both feet with no more than CGA for balance and multimodal cueing as needed OT Short Term Goal 1 - Progress (Week 2): Not met OT Short Term Goal 2 (Week 2): Pt will tolerate 2+ mins in stance during ADL task with no more than min A for balance OT Short Term Goal 2 - Progress (Week 2): Met OT Short Term Goal 3 (Week 2): Pt will attend to ADL task with min A for at least 2 mins to promote functional participation and sustained attention OT Short Term Goal 3 - Progress (Week 2): Met Week 3:  OT Short Term Goal 1 (Week 3): Pt will consistently complete 1/3 steps of toileting, with no more than min A for balance to promote independence with OOB toileting OT Short Term Goal 1 - Progress (Week 3): Progressing toward goal OT Short  Term Goal 2 (Week 3): Pt will attend to task >10 mins to promote sustained attention needed for ADL task OT Short Term Goal 2 - Progress (Week 3): Progressing toward goal OT Short Term Goal 3 (Week 3): Pt will initiate 2/3 steps with donning shirt, with no more than min A and multimodal cues PRN OT Short Term Goal 3 - Progress (Week 3): Met Week 4:   OT Short Term Goal 1 (Week 4): STG + LTG 2/2 ELOS   Skiler Olden E Cuyler Vandyken, MS, OTR/L  01/25/2022, 7:43 AM

## 2022-01-25 NOTE — Progress Notes (Signed)
Physical Therapy Weekly Progress Note  Patient Details  Name: Patricia Dudley MRN: [id number] Date of Birth: 24-Jul-1930  Beginning of progress report period: Jan 04, 2022 End of progress report period: January 25, 2022  Today's Date: 01/25/2022 PT Individual Time: 0383-3383 PT Individual Time Calculation (min): 68 min   Patient has met 1 of 3 short term goals. Pt continues to fluctuate with mobility. Pt tends to do better in the afternoons compared to mornings. Pt currently requires max/total A to transfer supine<>sitting EOB and mod/max A for sit<>supine. Pt requires anywhere from min of 1 to max A of 2 for sit<>stands and is able to transfer stand<>pivot with as little as min A of 1 but can require mod/max A of 1. Pt is currently able to ambulate ~18f with RW with as little as min A of 1 and advance RLE, but can require max A with gait and total A to advance RLE (does require +2 assist for [facility] due to tendency to sit randomly). Pt continues to be limited by poor motor planning/sequencing, impaired initiation, attention, problem solving, and inability to follow one step commands. Pt's daughters have been present for numerous hands on family education sessions and have verbalized confidence with ability to safely care for pt at home. Plan for continued family education training on 6/3.    Patient continues to demonstrate the following deficits muscle weakness, decreased cardiorespiratoy endurance, impaired timing and sequencing, unbalanced muscle activation, decreased coordination, and decreased motor planning, decreased attention to right and decreased motor planning, decreased initiation, decreased attention, decreased awareness, decreased problem solving, decreased safety awareness, decreased memory, and delayed processing, and decreased sitting balance, decreased standing balance, decreased postural control, hemiplegia, and decreased balance strategies and therefore will continue to  benefit from skilled PT intervention to increase functional independence with mobility.  Patient progressing toward long term goals..  Continue plan of care.  PT Short Term Goals Week 3:  PT Short Term Goal 1 (Week 3): pt will initate supine<>sit with mod A PT Short Term Goal 1 - Progress (Week 3): Progressing toward goal PT Short Term Goal 2 (Week 3): pt will transfer sit<>stand with mod A consistantly PT Short Term Goal 2 - Progress (Week 3): Progressing toward goal PT Short Term Goal 3 (Week 3): pt will ambulate 222fwith LRAD and mod A PT Short Term Goal 3 - Progress (Week 3): Met Week 4:  PT Short Term Goal 1 (Week 4): STG=LTG due to LOS  Skilled Therapeutic Interventions/Progress Updates:  Ambulation/gait training;Discharge planning;Functional mobility training;Therapeutic Activities;UE/LE Strength taining/ROM;Balance/vestibular training;Community reintegration;Neuromuscular re-education;Patient/family education;Therapeutic Exercise   Today's Interventions: Received pt sidelying in bed asleep, but more easily aroused this morning. Session with emphasis on functional mobility/transfers, toileting, dressing, generalized strengthening and endurance, dynamic standing balance/coordination, NMR, and gait training. Pt transferred semi-reclined<>sitting EOB with HOB elevated and use of bedrails with max/total A as pt not initiating any movement. Scooted to EOB with max A and donned pants sitting EOB with max A, however pt did assist with pulling pants up once they were donned to thighs. Stood from elevated EOB with RW and min A but pt found to be incontinent in brief and immediately attempting to sit. Quickly removed soiled brief and stand<>pivoted into TIS WC with max A for safety. Pt then transferred TIS WC<>bedside commode stand/squat<>pivot with RW and max A. Of note, motor planning/sequencing appears to be worse in mornings>afternoons. Pt unable to void any further. Donned clean brief sitting  with max  A and required multiple attempts to stand with RW and max A due to pt resisting and pushing posteriorly. Pt required x 2 attempts and total A to pull brief/pants over hips as pt still attempting to sit. RN notified and present to unhook IV, doffed nightgown with max A, and donned clean shirt with max A. Pt transported to/from room in TIS WC dependently. Sit<>stand with RW and max A and pt ambulated 58f with RW and max A with +2 for IV pole management and WC follow. Pt pushing RW too far forward requiring max A for control of RW. Pt with difficulty advancing feet, shuffling in place - therefore required manual facilitation to advance RLE. Pt attemping to sit while ambulating, requiring heavy max A for anterior weight shifting. Worked on initiation, attention, functional use of RUE, and problem solving folding washcloths x 2 trials. Pt did not initiate movement until therapist placed both hands on washcloth, then she began to fold and required mod cues to continue, as pt frequently "freezing" and  externally distracted staring out the window.  Returned to room and breakfast tray arrived. Concluded session with pt sitting in TIS WC, needs within reach, and seatbelt alarm on with NT present at bedside assisting with feeding.   Therapy Documentation Precautions:  Precautions Precautions: Fall Precaution Comments: aspiration precautions Restrictions Weight Bearing Restrictions: No  Therapy/Group: Individual Therapy AAlfonse AlpersPT, DPT  01/25/2022, 7:04 AM

## 2022-01-25 NOTE — Progress Notes (Signed)
Occupational Therapy Session Note  Patient Details  Name: Patricia Dudley MRN: [id number] Date of Birth: 1930/05/20  Today's Date: 01/25/2022 OT Individual Time: 2202-5427 & 1300-1330 OT Individual Time Calculation (min): 59 min & 30 min   Short Term Goals: Week 3:  OT Short Term Goal 1 (Week 3): Pt will consistently complete 1/3 steps of toileting, with no more than min A for balance to promote independence with OOB toileting OT Short Term Goal 2 (Week 3): Pt will attend to task >10 mins to promote sustained attention needed for ADL task OT Short Term Goal 3 (Week 3): Pt will initiate 2/3 steps with donning shirt, with no more than min A and multimodal cues PRN  Skilled Therapeutic Interventions/Progress Updates:  Session 1 Skilled OT intervention completed with focus on oral care, basic level sorting, one step command following, initiation and motor planning. Pt received seated in w/c, with NT finishing feeding with pt, with therapist taking over at care handoff. Pt nonverbal for entirety of session, however smiling during occasionally, indicating no/limited pain. Pt with about 25% of protein shake left, with hand over hand assist applied to R hand for initiating hand to mouth for about 25% of the movement then pt able to carry through to mouth and complete sucking in straw and swallow with delay noticed between gathering in mouth and active swallow. No pocketing noted via suction, with max A needed for oral care for teeth, with max cues needed to show teeth, however pt resisting presenting tongue for therapist to brush tongue. No multimodal cues effective this session, with pt gritting teeth hard.   Transported dependently in TIS chair > gym. Therapist displayed apple, banana and orange in front of pt, asking pt to identify banana. No initiation with grabbing the banana however able to shake head "yes" when asked if she liked fruit, as well as pt visually attending to the banana vs reaching  for it which is a technique pt has learned during SLP sessions. Transitioned to basic level motor planning task with catching/rolling ball. Pt with initial requirement of multimodal total A cues to open hands for holding ball and letting ball roll out of hands to cone for visual target however faded to mod A with repetitive trials. Pt's performance improving with therapist providing visual demonstration, alternating turns during task, with pt initiating about 30% of task for opening hands, leaning forward, and letting ball roll out of hands. Pt even let the ball roll once with no assist provided by therapist! Transported pt dependently to day room per next OT for [employer] session for feeding purposes. Pt was left seated in TIS chair, with IV pole, set up at table and in direct supervision of rehab tech at care hand off.  Session 2 Skilled OT intervention completed with focus on family education with daughter Patricia Dudley present, regarding maximizing dressing and HHOT purposes. Pt received seated in TIS chair, awake, with daughter in room. Non-verbal during session, with smiles on pt's face indicating NAD or pain. [place] with request that pt have new socks as her current pair have made an indention on her ankles. Retrieved gray socks with therapist cutting elastic band to prevent turnicate-like constriction. Utilized the opportunity to provide education about dressing tasks including allowing pt time to physically respond, how to improve initiation, as well as strategies such as placing pt's legs in figure 4 position for LB donning and having her complete 1 step or via forward chaining method. Donned socks with max A,  with pt kicking each leg out for donning, with seemingly discomfort with figure 4 position today as pt immediately transitioning out despite therapist attempting to hold position for access of feet. Discussed in depth options for [facility] services, goals and ways they can continue to progress pt at  home, with functional tasks that can also be utilized by family such as folding wash cloths, with pt able to return demonstrate 90% initiation with folding 4 wash cloths in room during the discussion. Pt was left seated in TIS chair with nurse present at direct care hand off.    Therapy Documentation Precautions:  Precautions Precautions: Fall Precaution Comments: aspiration precautions Restrictions Weight Bearing Restrictions: No    Therapy/Group: Individual Therapy  Milarose Savich E Mahlik Lenn 01/25/2022, 7:30 AM

## 2022-01-25 NOTE — Progress Notes (Signed)
Speech Language Pathology Daily Session Note  Patient Details  Name: Patricia Dudley MRN: [id number] Date of Birth: 1930-06-04  Today's Date: 01/25/2022 SLP Group Time: 1200-1230 SLP Group Time Calculation (min): 30 min  Short Term Goals: Week 3: SLP Short Term Goal 1 (Week 3): Pt will follow 5 commands within a functional context given Mod multimodal A. SLP Short Term Goal 2 (Week 3): Pt will communicate wants, needs, ideas, and thoughts x 5 given Min-Mod A and use of mulitmodal communication methods. SLP Short Term Goal 3 (Week 3): Pt will consume pureed textures with minimal oral holding/pocketing given Mod A multimodal cues. SLP Short Term Goal 4 (Week 3): Pt will maintain sustianed attention for 5 minutes with Min A.  Skilled Therapeutic Interventions: Skilled group treatment in [place] with OT focused on dysphagia and cognitive goals. SLP facilitated session by providing Mod -Max A verbal and tactile cues for initiation with self-feeding in a moderately distracting environment for ~60 minutes. Patient consumed her current diet of Dys. 1 textures with nectar-thick liquids without overt s/s of aspiration with decreased time needed for AP transit and increased timeliness of swallow initiation. Recommend patient continue current diet. Patient left upright in wheelchair with alarm on and all needs within reach. Continue with current plan of care.      Pain No/Denies Pain   Therapy/Group: Group Therapy  Trachelle Low 01/25/2022, 1:14 PM

## 2022-01-25 NOTE — Progress Notes (Signed)
PROGRESS NOTE   Subjective/Complaints: No new complaints expressed Discussed with aide at bedside- she has eaten some of her grits and is drinking Ensure  ROS: Limited due to language/communication   Objective:   No results found. No results for input(s): WBC, HGB, HCT, PLT in the last 72 hours.    Recent Labs    01/24/22 0525 01/25/22 0542  NA 141 144  K 4.5 4.9  CL 112* 111  CO2 25 25  GLUCOSE 186* 120*  BUN 57* 58*  CREATININE 3.00* 2.99*  CALCIUM 8.7* 8.8*    No intake or output data in the 24 hours ending 01/25/22 1106       Physical Exam: Vital Signs Blood pressure 136/89, pulse 97, temperature 98.6 F (37 C), temperature source Oral, resp. rate 16, height 5\' 5"  (1.651 m), weight 60.8 kg, SpO2 100 %. Constitutional: No distress . Vital signs reviewed. BMI 22.32 HEENT: NCAT, EOMI, oral membranes moist Neck: supple Cardiovascular: RRR without murmur. No JVD    Respiratory/Chest: CTA Bilaterally without wheezes or rales. Normal effort    GI/Abdomen: BS +, non-tender, non-distended Ext: no clubbing, cyanosis, or edema Psych: flat but cooperates Musculoskeletal:     Cervical back: Neck supple. Protracted posture    Comments: nods head, follows simple commands. Remains non-verbal. L grip/biceps 5-/5 BUE: 3/5 grip/biceps.  No appreciable RLE and LLE movement. Skin:    General: Skin is warm and dry.  Neurological:       nods head, follows simple commands. Remains non-verbal. L grip/biceps 5-/5, did not move right side for moe. Delayed processing Stand pivot transfer MinA Needs set-up for meals Functional mobility: holds Ensure and drinks herself with straw Psychiatric:     Comments: Smiling,  +echolalia    Assessment/Plan: 1. Functional deficits which require 3+ hours per day of interdisciplinary therapy in a comprehensive inpatient rehab setting. Physiatrist is providing close team supervision and  24 hour management of active medical problems listed below. Physiatrist and rehab team continue to assess barriers to discharge/monitor patient progress toward functional and medical goals  Care Tool:  Bathing    Body parts bathed by patient: Right arm, Left arm, Chest, Abdomen, Left upper leg, Right lower leg   Body parts bathed by helper: Face Body parts n/a: Front perineal area, Buttocks   Bathing assist Assist Level: Maximal Assistance - Patient 24 - 49%     Upper Body Dressing/Undressing Upper body dressing   What is the patient wearing?: Pull over shirt    Upper body assist Assist Level: Moderate Assistance - Patient 50 - 74%    Lower Body Dressing/Undressing Lower body dressing      What is the patient wearing?: Incontinence brief, Pants     Lower body assist Assist for lower body dressing: Maximal Assistance - Patient 25 - 49%     Toileting Toileting    Toileting assist Assist for toileting: Moderate Assistance - Patient 50 - 74%     Transfers Chair/bed transfer  Transfers assist     Chair/bed transfer assist level: Maximal Assistance - Patient 25 - 49%     Locomotion Ambulation   Ambulation assist   Ambulation activity did  not occur: Safety/medical concerns  Assist level: Minimal Assistance - Patient > 75% Assistive device: Walker-rolling Max distance: 61ft   Walk 10 feet activity   Assist  Walk 10 feet activity did not occur: Safety/medical concerns  Assist level: Minimal Assistance - Patient > 75% Assistive device: Walker-rolling   Walk 50 feet activity   Assist Walk 50 feet with 2 turns activity did not occur: Safety/medical concerns         Walk 150 feet activity   Assist Walk 150 feet activity did not occur: Safety/medical concerns         Walk 10 feet on uneven surface  activity   Assist Walk 10 feet on uneven surfaces activity did not occur: Safety/medical concerns         Wheelchair     Assist Is the  patient using a wheelchair?: No   Wheelchair activity did not occur: Safety/medical concerns         Wheelchair 50 feet with 2 turns activity    Assist    Wheelchair 50 feet with 2 turns activity did not occur: Safety/medical concerns       Wheelchair 150 feet activity     Assist  Wheelchair 150 feet activity did not occur: Safety/medical concerns       Blood pressure 136/89, pulse 97, temperature 98.6 F (37 C), temperature source Oral, resp. rate 16, height 5\' 5"  (1.651 m), weight 60.8 kg, SpO2 100 %.    Medical Problem List and Plan: 1. 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             -patient may  shower             -ELOS/Goals: expected dc 6/7 min A to mod A and Max a for auditory comprehension and Total A for verbal comprehension  -Continue CIR therapies including PT, OT, and SLP   Daughter [place] updated 2.  Anemia: continue Heparin Lastrup TID. Transfused 1U on 5/15. Post-transfusion hemoglobin trending upward nicely, repeat Monday              -antiplatelet therapy: aspirin 81 mg daily 3. Pain Management: Tylenol prn 4. Mood: LCSW to evaluate and provide emotional support             -antipsychotic agents: n/a 5. Neuropsych: This patient is not capable of making decisions on her own behalf. 6. Skin/Wound Care: Routine skin care checks 7. Dysphagia             -- continue Dysphagia 1 diet with thin liquids.  Crushed meds with applesauce.  SLP evaluation. MBS discussed with SLP and shows high risk of aspiration with thin liquids. 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  5/28- Cbgs very labile- 101 this AM, but 200s-300s otherwise- will increase Semglee to 14 (from 12) units and monitor 5/29- cbgs still labile--semglee just increased--observe today  -may need am dose of semglee 10: Hypertension: d/c Lisinopril due to worsening kidney function, continue amlodipine 10 mg, Lopressor. Repeat creatinine on Monday given increase in Lisinopril.  5/13 BP controlled today. 5/14 some variability in B/P, with mild elevation, some episodes of bradycardia (HR 46 at 051 today). -D/c lisinopril today since BUN 28->68 and Crt 2.28->3.20, with increase in past 2 days.  Hold lopressor with bradycardia episodes.  Will see how B/P trends on amlodipine 10 mg, may consider adding hydralazine if B/p is elevated.   -now hypotensive, change fluids to NS HS 12 hours -5/22 BP a little stable currently, will continue to monitor 5/27- BP slightly elevated in 150s, however off ACE inhibitor- likely up since beocming more euvolemic. Will monitor for trend.  5/29 bp under fair to reasonable control    01/25/2022    4:58 AM 01/24/2022    7:39 PM 01/24/2022    2:59 PM  Vitals with BMI  Systolic 030 092 330  Diastolic 89 88 64  Pulse 97 98 88   11: Hyperlipidemia: continue rosuvastatin 10 mg 12: Moderate malnutrition: continue supplements 13: Chronic kidney disease stage 3b: follow-up BMP     -5/13 B/P is in the 120's today. Pt had lisinopril increased to 40 mg yesterday for elevated B/P.  Since lisinopril may increase Crt, decrease lisinopril to 20 mg daily today and push po intake and repeat BMP. -5/14 Crt up to 3.2  and BUN up to 68.  Fluid bolus (1L NS) and continuous fluid (0.45 NS at 75 ml/hr) given for rise in BUN and Crt. -d/c lisinopril (per Dr. Christella Noa) since could be a possible cause of elevated Crt . Cr elevated, nephrology consulted. Urine studies obtained, creatinine improving, continue D5W 5/27- Cr down to 2.75 from 3.01- Renal on board- BUN 47 5/29 bun improved to 48, Cr still 2.75--renal has signed off.     Latest Ref Rng & Units 01/25/2022    5:42 AM 01/24/2022    5:25 AM 01/23/2022    5:51 AM  BMP  Glucose 70 - 99 mg/dL 120   186   190    BUN 8 - 23 mg/dL 58   57   47    Creatinine 0.44 - 1.00 mg/dL 2.99   3.00   2.90    Sodium 135 - 145 mmol/L 144   141   136    Potassium 3.5 - 5.1 mmol/L 4.9   4.5   4.7    Chloride 98 - 111 mmol/L 111   112   108    CO2 22 - 32 mmol/L 25   25   22     Calcium 8.9 - 10.3 mg/dL 8.8   8.7   8.4      14: Glaucoma: continue latanoprost drops 15: Hypothyroidism: continue Synthroid 16: Diarrhea/loose stools: monitor; getting loperamide 2 mg daily (BID prn) -continue current regimen. 17: Bowel and bladder incontinence: start bowel and bladder program 18. Daytime somnolence: [employer] and SW if therapy can be scheduled a little later in the day and if daughters can be present to help motivate her  5/27- still sleepy- might be worth looking into Ritalin? 19. Bradycardia: discontinue lopressor. 5/27- HR in 40s- just for last 12 hours-  will recheck with nursing- if still less than 60, will order EKG  20. History of GI bleed: start protonix.  21. Hypotension: resolved, hemoglobin reviewed and stable 22. Neurogenic bladder: bladder program ordered starting 5/11. 23. Stool incontinence: fiber added to bulk stool 24. Screening for vitamin D deficiency: Vitamin D level 50: start daily supplement 25. Fatigue: Iron reviewed and is 68, HGB slightly improved to 9.9. Continue vitamin D 1,000U daily 26. Elevated chloride: resolved, continue D5 to 51mls/hour 27.  Hypernatremia: resolved, continue D5 to 10, repeat Na tomorrow.           LOS: 22 days A FACE TO FACE EVALUATION WAS PERFORMED  Patricia Dudley P Patricia Dudley 01/25/2022, 11:06 AM

## 2022-01-26 DIAGNOSIS — Z515 Encounter for palliative care: Secondary | ICD-10-CM

## 2022-01-26 DIAGNOSIS — Z7189 Other specified counseling: Secondary | ICD-10-CM

## 2022-01-26 LAB — BASIC METABOLIC PANEL
Anion gap: 6 (ref 5–15)
BUN: 61 mg/dL — ABNORMAL HIGH (ref 8–23)
CO2: 25 mmol/L (ref 22–32)
Calcium: 8.9 mg/dL (ref 8.9–10.3)
Chloride: 114 mmol/L — ABNORMAL HIGH (ref 98–111)
Creatinine, Ser: 2.93 mg/dL — ABNORMAL HIGH (ref 0.44–1.00)
GFR, Estimated: 15 mL/min — ABNORMAL LOW (ref >60)
Glucose, Bld: 82 mg/dL (ref 70–99)
Potassium: 5.2 mmol/L — ABNORMAL HIGH (ref 3.5–5.1)
Sodium: 145 mmol/L (ref 135–145)

## 2022-01-26 LAB — GLUCOSE, CAPILLARY
Glucose-Capillary: 93 mg/dL (ref 70–99)
Glucose-Capillary: 140 mg/dL — ABNORMAL HIGH (ref 70–99)
Glucose-Capillary: 175 mg/dL — ABNORMAL HIGH (ref 70–99)
Glucose-Capillary: 214 mg/dL — ABNORMAL HIGH (ref 70–99)

## 2022-01-26 MED ORDER — METOPROLOL SUCCINATE ER 25 MG PO TB24
12.5000 mg | ORAL_TABLET | Freq: Every day | ORAL | Status: DC
  Administered 2022-01-26 – 2022-01-27 (×2): 12.5 mg via ORAL
  Filled 2022-01-26 (×2): qty 1

## 2022-01-26 NOTE — Progress Notes (Signed)
Palliative Medicine Inpatient Follow Up Note  HPI: 88 y.o. 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    PMT has been consulted to assist with goals of care conversation regarding nutrition given continued high risk of aspiration with thin liquids. Diet advanced to dysphagia 3/pured meats with thin liquids on 5/4, but downgraded back to dysphagia 1 diet, crushed meds on 5/5.   Today's Discussion 01/26/2022  *Please note that this is a verbal dictation therefore any spelling or grammatical errors are due to the "[place] [employer]" system interpretation.  Chart reviewed inclusive of vital signs, progress notes, laboratory results, and diagnostic images.   Family meeting held this afternoon with patient's daughter Juliann Pulse, [place] and grandson Merry Proud.  A formal review of Yalena's medical conditions was held in the setting of her ongoing dysphagia and aspiration pneumonia.  Family are all in agreement that [facility] not suffer.  Discussed the various caveats of care in various settings as below:  Acute rehab is intense rehab for patients who have experienced a major medical trauma and need serious efforts to aid in recovery. Some patients may have had a stroke, just come out of major surgery, had an amputation, or may still be dealing with a serious illness. Personalized multidisciplinary therapy scheduled throughout the day on average for three to four hours, five to seven days a week, to improve function and independence.   Skilled care is nursing and therapy care that can only be safely and effectively performed by,  or under the supervision of, professionals or Metallurgist. It's health care given when you need skilled nursing or skilled therapy to treat, manage, and observe your condition, and evaluate your care. In a skilled nursing facility you'll receive one or more therapies for an average of one to two hours per day. This includes physical, occupational, and speech therapy. The therapies are not considered intensive.   Palliative care is specialized medical care for people living with a serious illness, such as cancer, heart failure, COPD, Alzheimer's dementia, etc. Patients in palliative care may receive medical care for their symptoms, or palliative care, along with treatment intended to cure their serious illness   Hospice care focuses on the care, comfort, and quality of life of a person with a serious illness who is approaching the end of life. At some point, it may not be possible to cure a serious illness, or a patient may choose not to undergo certain treatments. Hospice is designed for this situation.  Reviewed patient's ongoing failure to thrive.  I shared that at this point we are maintaining Jameila by providing supplemental hydration which is not a long-term solution.  We reviewed gastrostomy tube placement and how this is often not utilized in dementia patients as it does not enhance the quality of the life that they are living.  Discussed comfort oriented care as well and the idea of stopping artificial measures of support and allowing Corrine's natural body processes to occur when it is time.  At this time patient's family is interested in learning more about hospice care though they would be most interested in inpatient hospice which patient may qualify for if do not continue artificial hydration.  Discussed  that we will have a hospice representative provide insight on whether or not [place] will qualify.  Family support provided.  Objective Assessment: Vital Signs Vitals:    01/26/22 0435 01/26/22 1600  BP: (!) 150/51 (!) 145/63  Pulse: (!) 101 (!) 106  Resp: 17 16  Temp: 98 F (36.7 C) 98.2 F (36.8 C)  SpO2: 100% 100%    Intake/Output Summary (Last 24 hours) at 01/26/2022 1715 Last data filed at 01/25/2022 1900 Gross per 24 hour  Intake 240 ml  Output --  Net 240 ml    Last Weight  Most recent update: 01/24/2022 11:54 AM    Weight  60.8 kg (134 lb 0.6 oz)            Gen: Frail elderly African-American female sitting in wheelchair HEENT: moist mucous membranes CV: Regular rate and rhythm PULM: On room air ABD: soft/nontender EXT: No edema Neuro: Somnolent  SUMMARY OF RECOMMENDATIONS   DNAR/DNI Patient's family is interested in learning more about inpatient hospice PMT will continue to follow  Total Time 67  Billing based on MDM: High  Problems Addressed: One acute or chronic illness or injury that poses a threat to life or bodily function  Amount and/or Complexity of Data: Category 3:Discussion of management or test interpretation with external physician/other qualified health care professional/appropriate source (not separately reported)  Risks: Decision not to resuscitate or to de-escalate care because of poor prognosis ______________________________________________________________________________________ [place] Team Team Cell Phone: [phone] Please utilize secure chat with additional questions, if there is no response within 30 minutes please call the above phone number  Palliative Medicine Team providers are available by phone from 7am to 7pm daily and can be reached through the team cell phone.  Should this patient require assistance outside of these hours, please call the patient's attending physician.

## 2022-01-26 NOTE — Progress Notes (Signed)
Occupational Therapy Session Note  Patient Details  Name: Patricia Dudley MRN: [id number] Date of Birth: 09/01/1929  Today's Date: 01/26/2022 OT Individual Time: 1445-1530 OT Individual Time Calculation (min): 45 min    Short Term Goals: Week 4:  OT Short Term Goal 1 (Week 4): STG + LTG 2/2 ELOS  Skilled Therapeutic Interventions/Progress Updates:    Family education scheduled today for OT. Initially 4 sisters present and then other 4 siblings arrived.  Pt received in room in w/c sleeping but did awaken when spoken to.  When I said "oh wow, you have 10 kids!" She opened her eyes and smiled.  Also smiled when the 35 great grandchildren were mentioned. Goal of family education (from primary OT) was to have family do more hands on training to prepare for discharge to home. Reviewed with family what apraxia is and decreased initiation and how to facilitate self feeding and her participation with self care tasks.   Palliative care RN had stopped in which led to a conversation about discharge.  Family expressed that they had the understanding that this Saturday session of family education was also to be with the social worker to discuss discharge plans.  "We wanted to sit down with the team as a group to discuss our mom's care and where she can go because we all now realize her medical care is too complicated and she needs too much care".  Spent time clarifying family's wishes.  On admission, they had hoped she would improve to be able to manage her at home and initially did not want SNF.  They now realize that she needs this level of care.  Palliative care RN also participating in conversation and I let everyone know that I would communicate with her MD, SW, OT and PT.   I did explain that some managed Medicare does not pay for SNF and the SW would need to look at what coverage she would have. I also explained that if it is covered, it often only covers 20 days so after that the family would be  responsible for her care at home or funding her care.  For future planning while 8 family members are here (several from out of town) they should spend some time discussing this.   They did see several transfers in PT earlier and due to their reservations about caring for her and pt's lethargy, they did not do hands on practice.   For transfers today, I demonstrated to all family members how to do hand placement on her back to facilitate forward weight shift for hip lift, squat pivot to her R (mod A), moving to R side lying and then supine. Pt did help with bridging to scoot hips over to L side of bed to give her space to lay in R sidelying. Family stated she is most comfortable this way.  Pt adjusted in bed with all needs met.  RN arrived.   Spoke with MD about family's concerns that they feel she needs more care than they are able to provide.   Therapy Documentation Precautions:  Precautions Precautions: Fall Precaution Comments: aspiration precautions Restrictions Weight Bearing Restrictions: No    Vital Signs: Therapy Vitals Temp: 98 F (36.7 C) Temp Source: Axillary Pulse Rate: (!) 101 Resp: 17 BP: (!) 150/51 Patient Position (if appropriate): Lying Oxygen Therapy SpO2: 100 % O2 Device: Room Air Pain: Pain Assessment Pain Scale: 0-10 Pain Score: 0-No pain     Therapy/Group: Individual Therapy  Margalit Leece 01/26/2022, 8:01  AM

## 2022-01-26 NOTE — Progress Notes (Signed)
Speech Language Pathology Daily Session Note  Patient Details  Name: Patricia Dudley MRN: [id number] Date of Birth: 1930-04-18  Today's Date: 01/26/2022 SLP Individual Time: 1355-1430 SLP Individual Time Calculation (min): 35 min  Short Term Goals: Week 4: SLP Short Term Goal 1 (Week 4): STG=LTG due to ELOS (6/7)  Skilled Therapeutic Interventions:Skilled ST services focused on education and swallow skills. Pt missed initial 10 minutes due to ongoing PT session. SLP used this time to start education with some of the family members present. Family expressed concern with pt's level of care, SLP answered questions. Pt and the rest of the family members arrived, handed off by PT. Pt verbalized x4 during initial greeting at word level!! Pt was unable to continue verbalization on command after this occurrence. SLP provided education that pt's verbalizations are rare, but to occur most often in automatic language task such as greetings, singing and counting. SLP educated family to utilize yes/no questions with max multimodal cues and pt's nonverbal responses of head nods, eye contact and body language for communication. SLP facilitated PO intake of pureed textures and nectar thick liquids via straw. Pt consumed minimal amount due to fatigue, resulting in increase swallow delay and reduced AP transport. SLP completed oral care. SLP provided education pertaining to swallow function, swallow strategies and risk of aspiration. All questions answered to satisfaction. Pt was left in room with family, call bell within reach and chair alarm set. SLP recommends to continue skilled services.     Pain Pain Assessment Pain Scale: 0-10 Pain Score: 0-No pain  Therapy/Group: Individual Therapy  Mattie Novosel  [facility] 01/26/2022, 12:56 PM

## 2022-01-26 NOTE — Progress Notes (Signed)
PROGRESS NOTE   Subjective/Complaints: Sleepy this morning Patient's chart reviewed- No issues reported overnight Vitals signs stable except for soft diastolic, elevated systolic, and tachycardia  LKG:MWNUUVO due to language/communication   Objective:   No results found. No results for input(s): WBC, HGB, HCT, PLT in the last 72 hours.    Recent Labs    01/25/22 0542 01/26/22 0633  NA 144 145  K 4.9 5.2*  CL 111 114*  CO2 25 25  GLUCOSE 120* 82  BUN 58* 61*  CREATININE 2.99* 2.93*  CALCIUM 8.8* 8.9     Intake/Output Summary (Last 24 hours) at 01/26/2022 1506 Last data filed at 01/25/2022 1900 Gross per 24 hour  Intake 240 ml  Output --  Net 240 ml         Physical Exam: Vital Signs Blood pressure (!) 150/51, pulse (!) 101, temperature 98 F (36.7 C), temperature source Axillary, resp. rate 17, height 5\' 5"  (1.651 m), weight 60.8 kg, SpO2 100 %. Constitutional: No distress . Vital signs reviewed. BMI 22.32 HEENT: NCAT, EOMI, oral membranes moist Neck: supple Cardiovascular: Tachycardia Respiratory/Chest: CTA Bilaterally without wheezes or rales. Normal effort    GI/Abdomen: BS +, non-tender, non-distended Ext: no clubbing, cyanosis, or edema Psych: flat but cooperates Musculoskeletal:     Cervical back: Neck supple. Protracted posture    Comments: nods head, follows simple commands. Remains non-verbal. L grip/biceps 5-/5 BUE: 3/5 grip/biceps.  No appreciable RLE and LLE movement. Skin:    General: Skin is warm and dry.  Neurological:       nods head, follows simple commands. Remains non-verbal. L grip/biceps 5-/5, did not move right side for moe. Delayed processing Stand pivot transfer MinA Needs set-up for meals Functional mobility: holds Ensure and drinks herself with straw Psychiatric:     Comments: Smiling,  +echolalia    Assessment/Plan: 1. Functional deficits which require 3+ hours per day  of interdisciplinary therapy in a comprehensive inpatient rehab setting. Physiatrist is providing close team supervision and 24 hour management of active medical problems listed below. Physiatrist and rehab team continue to assess barriers to discharge/monitor patient progress toward functional and medical goals  Care Tool:  Bathing    Body parts bathed by patient: Right arm, Left arm, Chest, Abdomen, Left upper leg, Right lower leg   Body parts bathed by helper: Face Body parts n/a: Front perineal area, Buttocks   Bathing assist Assist Level: Maximal Assistance - Patient 24 - 49%     Upper Body Dressing/Undressing Upper body dressing   What is the patient wearing?: Pull over shirt    Upper body assist Assist Level: Moderate Assistance - Patient 50 - 74%    Lower Body Dressing/Undressing Lower body dressing      What is the patient wearing?: Incontinence brief, Pants     Lower body assist Assist for lower body dressing: Maximal Assistance - Patient 25 - 49%     Toileting Toileting    Toileting assist Assist for toileting: Moderate Assistance - Patient 50 - 74%     Transfers Chair/bed transfer  Transfers assist     Chair/bed transfer assist level: Maximal Assistance - Patient 25 - 49%  Locomotion Ambulation   Ambulation assist   Ambulation activity did not occur: Safety/medical concerns  Assist level: Minimal Assistance - Patient > 75% Assistive device: Walker-rolling Max distance: 64ft   Walk 10 feet activity   Assist  Walk 10 feet activity did not occur: Safety/medical concerns  Assist level: Minimal Assistance - Patient > 75% Assistive device: Walker-rolling   Walk 50 feet activity   Assist Walk 50 feet with 2 turns activity did not occur: Safety/medical concerns         Walk 150 feet activity   Assist Walk 150 feet activity did not occur: Safety/medical concerns         Walk 10 feet on uneven surface  activity   Assist  Walk 10 feet on uneven surfaces activity did not occur: Safety/medical concerns         Wheelchair     Assist Is the patient using a wheelchair?: No   Wheelchair activity did not occur: Safety/medical concerns         Wheelchair 50 feet with 2 turns activity    Assist    Wheelchair 50 feet with 2 turns activity did not occur: Safety/medical concerns       Wheelchair 150 feet activity     Assist  Wheelchair 150 feet activity did not occur: Safety/medical concerns       Blood pressure (!) 150/51, pulse (!) 101, temperature 98 F (36.7 C), temperature source Axillary, resp. rate 17, height 5\' 5"  (1.651 m), weight 60.8 kg, SpO2 100 %.    Medical Problem List and Plan: 1. 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             -patient may  shower             -ELOS/Goals: expected dc 6/7 min A to mod A and Max a for auditory comprehension and Total A for verbal comprehension  -Continue CIR therapies including PT, OT, and SLP   Daughter [place] updated 2.  Anemia: continue Heparin [place] TID. Transfused 1U on 5/15. Post-transfusion hemoglobin trending upward nicely, repeat Monday              -antiplatelet therapy: aspirin 81 mg daily 3. Pain Management: Tylenol prn 4. Mood: LCSW to evaluate and provide emotional support             -antipsychotic agents: n/a 5. Neuropsych: This patient is not capable of making decisions on her own behalf. 6. Skin/Wound Care: Routine skin care checks 7. Dysphagia             -- continue Dysphagia 1 diet with thin liquids.  Crushed meds with applesauce.  SLP evaluation. MBS discussed with SLP and shows high risk of aspiration with thin liquids. 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  5/28- Cbgs very labile- 101 this AM, but 200s-300s otherwise- will increase Semglee to 14 (from 12) units and monitor 5/29- cbgs still labile--semglee just increased--observe today  -may need am dose of semglee 10: Hypertension: d/c Lisinopril due to worsening kidney function, continue amlodipine 10 mg, Lopressor. Repeat creatinine on Monday given increase in Lisinopril.  5/13 BP controlled today. 5/14 some variability in B/P, with mild elevation, some episodes of bradycardia (HR 46 at 051 today). -D/c lisinopril today since BUN 28->68 and Crt 2.28->3.20, with increase in past 2 days.  Hold lopressor with bradycardia episodes.  Will see how B/P trends on amlodipine 10 mg, may consider adding hydralazine if B/p is elevated.   -now hypotensive, change fluids to NS HS 12 hours -5/22 BP a little stable currently, will continue to monitor 5/27- BP slightly elevated in 150s, however off ACE inhibitor- likely up since beocming more euvolemic. Will monitor for trend.  5/29 bp under fair to reasonable control    01/26/2022    4:35 AM 01/25/2022    7:42 PM 01/25/2022    3:28 PM  Vitals with BMI  Systolic 660 630 90  Diastolic 51 55 68  Pulse 160 104 93   11: Hyperlipidemia: continue rosuvastatin 10 mg 12: Moderate malnutrition: continue supplements 13: Chronic kidney disease stage 3b: follow-up BMP     -5/13 B/P is in the 120's today. Pt had lisinopril increased to 40 mg yesterday  for elevated B/P.  Since lisinopril may increase Crt, decrease lisinopril to 20 mg daily today and push po intake and repeat BMP. -5/14 Crt up to 3.2 and BUN up to 68.  Fluid bolus (1L NS) and continuous fluid (0.45 NS at 75 ml/hr) given for rise in BUN and Crt. -d/c lisinopril (per Dr. Christella Noa) since could be a possible cause of elevated Crt . Cr elevated, nephrology consulted. Urine studies obtained, creatinine improving, continue D5W 5/27- Cr down to 2.75 from 3.01- Renal on board- BUN 47 5/29 bun improved to 48, Cr still 2.75--renal has signed off.     Latest Ref Rng & Units 01/26/2022    6:33 AM 01/25/2022    5:42 AM 01/24/2022    5:25 AM  BMP  Glucose 70 - 99 mg/dL 82   120   186    BUN 8 - 23 mg/dL 61   58   57    Creatinine 0.44 - 1.00 mg/dL 2.93   2.99   3.00    Sodium 135 - 145 mmol/L 145   144   141    Potassium 3.5 - 5.1 mmol/L 5.2   4.9   4.5    Chloride 98 - 111 mmol/L 114   111   112    CO2 22 - 32 mmol/L 25   25   25     Calcium 8.9 - 10.3 mg/dL 8.9   8.8   8.7      14: Glaucoma: continue latanoprost drops 15: Hypothyroidism: continue Synthroid 16: Diarrhea/loose stools: monitor; getting loperamide 2 mg daily (BID prn) -continue current regimen. 17: Bowel and bladder incontinence: start bowel and bladder program 18. Daytime somnolence: [employer] and SW if therapy can be scheduled a little later in the day and if daughters can be present to help motivate her  5/27- still sleepy- might be worth looking into Ritalin?  19. Bradycardia: discontinue lopressor. 5/27- HR in 40s- just for last 12 hours- will recheck with nursing- if still less than 60, will order EKG  20. History of GI bleed: start protonix.  21. Hypotension: resolved, hemoglobin reviewed and stable 22. Neurogenic bladder: bladder program ordered starting 5/11. 23. Stool incontinence: fiber added to bulk stool 24. Screening for vitamin D deficiency: Vitamin D level 50: start daily supplement 25. Fatigue: Iron  reviewed and is 68, HGB slightly improved to 9.9. Continue vitamin D 1,000U daily 26. Elevated chloride: resolved, increase D5 to 22mls/hour 27. Hypernatremia: resolved, increase D5 back to 50 given elevated chloride, repeat Na tomorrow.  28. Tachycardia: Toprol XL ordered.           LOS: 23 days A FACE TO FACE EVALUATION WAS PERFORMED  Patricia Dudley 01/26/2022, 3:06 PM

## 2022-01-26 NOTE — Progress Notes (Signed)
Physical Therapy Session Note  Patient Details  Name: Patricia Dudley MRN: [id number] Date of Birth: 05/21/1930  Today's Date: 01/26/2022 PT Individual Time: 1305-1350 PT Individual Time Calculation (min): 45 min   Short Term Goals: Week 1:  PT Short Term Goal 1 (Week 1): Pt will roll side to side w/ max A PT Short Term Goal 2 (Week 1): Pt will transfer sup to sit w/ max A PT Short Term Goal 3 (Week 1): Pt will transfer sit to stand w/ max A PT Short Term Goal 4 (Week 1): Pt will amb w/ LRAD x 10'. Week 2:  PT Short Term Goal 1 (Week 2): pt will transfer bed<>chair with LRAD and mod A of 1 consistantly PT Short Term Goal 1 - Progress (Week 2): Met PT Short Term Goal 2 (Week 2): pt will transfer sit<>stand with LRAD and mod A of 1 consistantly PT Short Term Goal 2 - Progress (Week 2): Progressing toward goal PT Short Term Goal 3 (Week 2): pt will ambulate 10ft with LRAD and mod A of 1 PT Short Term Goal 3 - Progress (Week 2): Met Week 3:  PT Short Term Goal 1 (Week 3): pt will initate supine<>sit with mod A PT Short Term Goal 1 - Progress (Week 3): Progressing toward goal PT Short Term Goal 2 (Week 3): pt will transfer sit<>stand with mod A consistantly PT Short Term Goal 2 - Progress (Week 3): Progressing toward goal PT Short Term Goal 3 (Week 3): pt will ambulate 20ft with LRAD and mod A PT Short Term Goal 3 - Progress (Week 3): Met  Skilled Therapeutic Interventions/Progress Updates:   Pt w/8 family members in attendance for training today.  7 attended full session as observers or active participants.   Pt oob in wc, transported to gym for session w/focus on gait/transfer training.  Family w/no specific goals for session stating "we just want to see what she is doing so we can decide a plan".   Therapist demonstrated Sit to stand, stand to sit, gait x 5-8ft w/RW and +2 mod assist/one daughter acting as +2.  Discussed guarding, facilitation of wt shifting, need for wc follow due to  poor motor planning/apraxia/aphasia.  Therapist then demonstrated car transfer w/Pt using RW requiring max assist.  Discussed how second person could assist from inside vehicle when needed.  Discussed  set up, operation of brakes/positioning of wc, guarding, body mechanics.   One daughter then participated actively first using therapist acting as pt to perform transfer then w/actual patient.  Daugher able to complete max assist stand pivot transfer into/out of car w/min assist and instruction from therapist.  Confirmed w/family that pt will require 24hr hands on assist at DC.    Pt left oob in wc handed off in hallway to ST from next training session.     Therapy Documentation Precautions:  Precautions Precautions: Fall Precaution Comments: aspiration precautions Restrictions Weight Bearing Restrictions: No     Therapy/Group: Individual Therapy  , PT    M  01/26/2022, 3:56 PM  

## 2022-01-27 LAB — GLUCOSE, CAPILLARY
Glucose-Capillary: 150 mg/dL — ABNORMAL HIGH (ref 70–99)
Glucose-Capillary: 190 mg/dL — ABNORMAL HIGH (ref 70–99)
Glucose-Capillary: 246 mg/dL — ABNORMAL HIGH (ref 70–99)
Glucose-Capillary: 130 mg/dL — ABNORMAL HIGH (ref 70–99)

## 2022-01-27 LAB — BASIC METABOLIC PANEL
Anion gap: 10 (ref 5–15)
BUN: 58 mg/dL — ABNORMAL HIGH (ref 8–23)
CO2: 20 mmol/L — ABNORMAL LOW (ref 22–32)
Calcium: 8.3 mg/dL — ABNORMAL LOW (ref 8.9–10.3)
Chloride: 108 mmol/L (ref 98–111)
Creatinine, Ser: 3.32 mg/dL — ABNORMAL HIGH (ref 0.44–1.00)
GFR, Estimated: 13 mL/min — ABNORMAL LOW (ref >60)
Glucose, Bld: 234 mg/dL — ABNORMAL HIGH (ref 70–99)
Potassium: 5.7 mmol/L — ABNORMAL HIGH (ref 3.5–5.1)
Sodium: 138 mmol/L (ref 135–145)

## 2022-01-27 MED ORDER — METOPROLOL SUCCINATE ER 25 MG PO TB24
25.0000 mg | ORAL_TABLET | Freq: Every day | ORAL | Status: DC
  Administered 2022-01-28 – 2022-01-29 (×2): 25 mg via ORAL
  Filled 2022-01-27 (×4): qty 1

## 2022-01-27 MED ORDER — SODIUM POLYSTYRENE SULFONATE 15 GM/60ML PO SUSP
30.0000 g | Freq: Once | ORAL | Status: DC
  Filled 2022-01-27: qty 120

## 2022-01-27 MED ORDER — INSULIN ASPART 100 UNIT/ML IJ SOLN
5.0000 [IU] | Freq: Once | INTRAMUSCULAR | Status: DC
  Filled 2022-01-27: qty 0.05

## 2022-01-27 MED ORDER — INSULIN ASPART 100 UNIT/ML IJ SOLN
5.0000 [IU] | Freq: Once | INTRAMUSCULAR | Status: AC
Start: 2022-01-27 — End: 2022-01-27
  Administered 2022-01-27: 5 [IU] via INTRAVENOUS

## 2022-01-27 MED ORDER — DEXTROSE 50 % IV SOLN
25.0000 g | Freq: Once | INTRAVENOUS | Status: AC
  Administered 2022-01-27: 25 g via INTRAVENOUS

## 2022-01-27 MED ORDER — DEXTROSE 50 % IV SOLN
50.0000 mL | Freq: Once | INTRAVENOUS | Status: DC
  Filled 2022-01-27: qty 50

## 2022-01-27 NOTE — Progress Notes (Addendum)
Received call from [place], [place] that patient is unable to tolerate taking the 120 ml of liquid Kayexalate for hyperkalemia.  Other options such as Milly Jakob might also be difficult for patient to tolerate since she is not wanting to take po liquids.  Consulted with Dr. Ranell Patrick, MD and Manuela Schwartz, [facility] about Dextrose/insulin for hyperkalemia treatment since patient has peripheral IV.  Orders placed by pharmacy for dextrose and insulin.  Initial orders placed by pharmacist Manuela Schwartz, [facility] but then modified for IV administration by Narda Bonds, RPH on night shift.  Pharmacist Narda Bonds, [facility] provided administration instructions to floor nurse by telephone.  Morning labs already placed to follow up on potassium levels.

## 2022-01-27 NOTE — Progress Notes (Addendum)
[place] 4W-07 [facility]) Hospital Liaison Note  Received request from Tacey Ruiz, NP with PMT to meet with family to discuss hospice at home, LTC and inpatient unit.  Reviewed chart, assessed patient in room, met with [name] and [place] to initiate education regarding hospice care.   Family cannot care for patient at home. Discussed LTC and hospice and encouraged family to discuss Medicaid application with SW.   Family requested review for hospice eligibility at [facility]. Patient is hospice eligible but not inpatient eligible at this time.  We will continue to follow on the periphery for discharge disposition.  ACC contact information provided to [place] and [place].  Thank you for the opportunity to participate in this patients care.  Margaretmary Eddy, BSN, RN [place] Liaison [phone]

## 2022-01-27 NOTE — Progress Notes (Signed)
Patient's Potassium was measured at 5.7. Kayexalate was prescribed by provider. Patient is unable to open mouth to take it. Contacted Luetta Nutting NP for resolution. Instructed to give 5 units of insuline via IV and flush and following with 50 mL of Dextrose along with another flush. Will follow orders as given and will continue to monitor.    Gladstone Lighter, LPN

## 2022-01-27 NOTE — Progress Notes (Addendum)
Palliative Medicine Inpatient Follow Up Note  HPI: 88 y.o. 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    PMT has been consulted to assist with goals of care conversation regarding nutrition given continued high risk of aspiration with thin liquids. Diet advanced to dysphagia 3/pured meats with thin liquids on 5/4, but downgraded back to dysphagia 1 diet, crushed meds on 5/5.   Today's Discussion 01/27/2022  *Please note that this is a verbal dictation therefore any spelling or grammatical errors are due to the "[place]" system interpretation.  Chart reviewed inclusive of vital signs, progress notes, laboratory results, and diagnostic images.   I met at bedside with [facility] this morning. She was sitting in her wheelchair sleeping. I touched her hand and she was able to raise her head and look at me though she did not respond verbally. She was in no distress.   I have reached out to [facility] to communicate with patients daughter [name] regarding their hospice services. Given Shawnika's lack of oral intake and her multi-system failure despite aggressive interventions I do believe she would qualify for residential hospice most especially in the case of her not receiving maintenance IVF. Her family is aware to mIVF is not a long term solution and is delaying the inevitable processes of the body. Their goals are to not allow patient suffering.    If patient does not qualify for IP hospice - family may desire short term rehabilitation as a respite to arrange a plan for additional care. They are all aware that Fiona may not improve beyond her present  state.   Objective Assessment: Vital Signs Vitals:   01/26/22 2020 01/27/22 0349  BP: 122/68 (!) 115/94  Pulse: 99 (!) 108  Resp: 18 18  Temp: 97.7 F (36.5 C) 98.4 F (36.9 C)  SpO2: 100% 100%    Intake/Output Summary (Last 24 hours) at 01/27/2022 4656 Last data filed at 01/27/2022 0900 Gross per 24 hour  Intake 140 ml  Output --  Net 140 ml    Last Weight  Most recent update: 01/24/2022 11:54 AM    Weight  60.8 kg (134 lb 0.6 oz)            Gen: Frail elderly African-American female sitting in wheelchair HEENT: moist mucous membranes CV: Regular rate and rhythm PULM: On room air ABD: soft/nontender EXT: No edema Neuro: Somnolent  SUMMARY OF RECOMMENDATIONS   DNAR/DNI Patient's family is interested in learning more about inpatient hospice --> [facility] Liaison Olivia Mackie will speak with her daughter [name] PMT will continue to follow  Billing based on MDM: High _____________________________ Addendum:  I spoke to hospice liaison Olivia Mackie - patient is not approved for inpatient hospice at this time. Tomorrow the MSW will need to help further support Aliyanna's family in regarding medicaid and long term care placement.  ______________________________________________________________________________________ [place] Team Team Cell Phone: [phone] Please utilize secure chat with additional questions, if there is no response within 30 minutes please call the above phone number  Palliative Medicine Team providers are available by phone from 7am to 7pm daily and can be reached through the team cell phone.  Should this patient require assistance outside of these hours, please  call the patient's attending physician.

## 2022-01-27 NOTE — Progress Notes (Signed)
PROGRESS NOTE   Subjective/Complaints: Family meeting yesterday, family is leaning toward inpatient hospice as they want her to be as comfortable as possible and feels she has lost motivation IV infiltrated and new one placed in left arm  Objective:   No results found. No results for input(s): WBC, HGB, HCT, PLT in the last 72 hours.    Recent Labs    01/25/22 0542 01/26/22 0633  NA 144 145  K 4.9 5.2*  CL 111 114*  CO2 25 25  GLUCOSE 120* 82  BUN 58* 61*  CREATININE 2.99* 2.93*  CALCIUM 8.8* 8.9     Intake/Output Summary (Last 24 hours) at 01/27/2022 1347 Last data filed at 01/27/2022 0900 Gross per 24 hour  Intake 140 ml  Output --  Net 140 ml         Physical Exam: Vital Signs Blood pressure (!) 115/94, pulse (!) 108, temperature 98.4 F (36.9 C), temperature source Oral, resp. rate 18, height 5\' 5"  (1.651 m), weight 60.8 kg, SpO2 100 %. Constitutional: No distress . Vital signs reviewed. BMI 22.32 HEENT: NCAT, EOMI, oral membranes moist Neck: supple Cardiovascular: Tachycardia Respiratory/Chest: CTA Bilaterally without wheezes or rales. Normal effort    GI/Abdomen: BS +, non-tender, non-distended Ext: Right upper extremity swelling from IV infiltration Psych: flat but cooperates Musculoskeletal:     Cervical back: Neck supple. Protracted posture    Comments: nods head, follows simple commands. Remains non-verbal. L grip/biceps 5-/5 BUE: 3/5 grip/biceps.  No appreciable RLE and LLE movement. Skin:    General: Skin is warm and dry.  Neurological:       nods head, follows simple commands. Remains non-verbal. L grip/biceps 5-/5, did not move right side for moe. Delayed processing Stand pivot transfer MinA Needs set-up for meals Functional mobility: holds Ensure and drinks herself with straw Psychiatric:     Comments: Smiling,  +echolalia    Assessment/Plan: 1. Functional deficits which require 3+  hours per day of interdisciplinary therapy in a comprehensive inpatient rehab setting. Physiatrist is providing close team supervision and 24 hour management of active medical problems listed below. Physiatrist and rehab team continue to assess barriers to discharge/monitor patient progress toward functional and medical goals  Care Tool:  Bathing    Body parts bathed by patient: Right arm, Left arm, Chest, Abdomen, Left upper leg, Right lower leg   Body parts bathed by helper: Face Body parts n/a: Front perineal area, Buttocks   Bathing assist Assist Level: Maximal Assistance - Patient 24 - 49%     Upper Body Dressing/Undressing Upper body dressing   What is the patient wearing?: Pull over shirt    Upper body assist Assist Level: Moderate Assistance - Patient 50 - 74%    Lower Body Dressing/Undressing Lower body dressing      What is the patient wearing?: Incontinence brief, Pants     Lower body assist Assist for lower body dressing: Maximal Assistance - Patient 25 - 49%     Toileting Toileting    Toileting assist Assist for toileting: Moderate Assistance - Patient 50 - 74%     Transfers Chair/bed transfer  Transfers assist     Chair/bed transfer  assist level: Maximal Assistance - Patient 25 - 49%     Locomotion Ambulation   Ambulation assist   Ambulation activity did not occur: Safety/medical concerns  Assist level: Minimal Assistance - Patient > 75% Assistive device: Walker-rolling Max distance: 13ft   Walk 10 feet activity   Assist  Walk 10 feet activity did not occur: Safety/medical concerns  Assist level: Minimal Assistance - Patient > 75% Assistive device: Walker-rolling   Walk 50 feet activity   Assist Walk 50 feet with 2 turns activity did not occur: Safety/medical concerns         Walk 150 feet activity   Assist Walk 150 feet activity did not occur: Safety/medical concerns         Walk 10 feet on uneven surface   activity   Assist Walk 10 feet on uneven surfaces activity did not occur: Safety/medical concerns         Wheelchair     Assist Is the patient using a wheelchair?: No   Wheelchair activity did not occur: Safety/medical concerns         Wheelchair 50 feet with 2 turns activity    Assist    Wheelchair 50 feet with 2 turns activity did not occur: Safety/medical concerns       Wheelchair 150 feet activity     Assist  Wheelchair 150 feet activity did not occur: Safety/medical concerns       Blood pressure (!) 115/94, pulse (!) 108, temperature 98.4 F (36.9 C), temperature source Oral, resp. rate 18, height 5\' 5"  (1.651 m), weight 60.8 kg, SpO2 100 %.    Medical Problem List and Plan: 1. 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             -patient may  shower             -ELOS/Goals: expected dc 6/7 min A to mod A and Max a for auditory comprehension and Total A for verbal comprehension  -Continue CIR therapies including PT, OT, and SLP   Family updated on Saturday, hospice liaison consulted for inpatient hospice 2.  Anemia: continue Heparin [place] TID. Transfused 1U on 5/15. Post-transfusion hemoglobin trending upward nicely, repeat Monday              -antiplatelet therapy: aspirin 81 mg daily 3. Pain Management: Tylenol prn 4. Mood: LCSW to evaluate and provide emotional support             -antipsychotic agents: n/a 5. Neuropsych: This patient is not capable of making decisions on her own behalf. 6. Skin/Wound Care: Routine skin care checks 7. Dysphagia             -- continue Dysphagia 1 diet with thin liquids.  Crushed meds with applesauce.  SLP evaluation. MBS discussed with SLP and shows high risk of aspiration with thin liquids. Discussed with daughter and SLP thickened smoothies at home for good nutrition             -- Continue nutritional supplements, B12, MVI   8: Paroxysmal atrial  fibrillation: continue Lopressor 50 mg BID --home torsemide on hold --no AC due to small bowel AVMs Add magnesium gluconate 250mg  HS    01/27/2022    3:49 AM 01/26/2022    8:20 PM 01/26/2022    4:00 PM  Vitals with BMI  Systolic 277 824 235  Diastolic 94 68 63  Pulse 361 99 106    9: Diabetes  mellitus: CBGs qAC,qHS; monitor intake and activity --continue SSI --increase glipizide back to 10mg  (home dose)- changed timing to after supper due to AM hypoglycemia and elevated CBGs HS Increase Semglee to 15U started HS D/c Ensure between meals D/c HS sliding scale due to AM hypoglycemia.  5/28- Cbgs very labile- 101 this AM, but 200s-300s otherwise- will increase Semglee to 14 (from 12) units and monitor 5/29- cbgs still labile--semglee just increased--observe today  -may need am dose of semglee 10: Hypertension: d/c Lisinopril due to worsening kidney function, continue amlodipine 10 mg, Lopressor. Repeat creatinine on Monday given increase in Lisinopril.  5/13 BP controlled today. 5/14 some variability in B/P, with mild elevation, some episodes of bradycardia (HR 46 at 051 today). -D/c lisinopril today since BUN 28->68 and Crt 2.28->3.20, with increase in past 2 days.  Hold lopressor with bradycardia episodes.  Will see how B/P trends on amlodipine 10 mg, may consider adding hydralazine if B/p is elevated.   -now hypotensive, change fluids to NS HS 12 hours -5/22 BP a little stable currently, will continue to monitor 5/27- BP slightly elevated in 150s, however off ACE inhibitor- likely up since beocming more euvolemic. Will monitor for trend.  5/29 bp under fair to reasonable control    01/27/2022    3:49 AM 01/26/2022    8:20 PM 01/26/2022    4:00 PM  Vitals with BMI  Systolic 630 160 109  Diastolic 94 68 63  Pulse 323 99 106   11: Hyperlipidemia: continue rosuvastatin 10 mg 12: Moderate malnutrition: continue supplements 13: Chronic kidney disease stage 3b: follow-up BMP     -5/13 B/P is  in the 120's today. Pt had lisinopril increased to 40 mg yesterday for elevated B/P.  Since lisinopril may increase Crt, decrease lisinopril to 20 mg daily today and push po intake and repeat BMP. -5/14 Crt up to 3.2 and BUN up to 68.  Fluid bolus (1L NS) and continuous fluid (0.45 NS at 75 ml/hr) given for rise in BUN and Crt. -d/c lisinopril (per Dr. Christella Noa) since could be a possible cause of elevated Crt . Cr elevated, nephrology consulted. Urine studies obtained, creatinine improving, continue D5W 5/27- Cr down to 2.75 from 3.01- Renal on board- BUN 47 5/29 bun improved to 48, Cr still 2.75--renal has signed off.     Latest Ref Rng & Units 01/26/2022    6:33 AM 01/25/2022    5:42 AM 01/24/2022    5:25 AM  BMP  Glucose 70 - 99 mg/dL 82   120   186    BUN 8 - 23 mg/dL 61   58   57    Creatinine 0.44 - 1.00 mg/dL 2.93   2.99   3.00    Sodium 135 - 145 mmol/L 145   144   141    Potassium 3.5 - 5.1 mmol/L 5.2   4.9   4.5    Chloride 98 - 111 mmol/L 114   111   112    CO2 22 - 32 mmol/L 25   25   25     Calcium 8.9 - 10.3 mg/dL 8.9   8.8   8.7      14: Glaucoma: continue latanoprost drops 15: Hypothyroidism: continue Synthroid 16: Diarrhea/loose stools: monitor; getting loperamide 2 mg daily (BID prn) -continue current regimen. 17: Bowel and bladder incontinence: start bowel and bladder program 18. Daytime somnolence: [employer] and SW if therapy can be scheduled a little later in the day  and if daughters can be present to help motivate her  5/27- still sleepy- might be worth looking into Ritalin? 19. Bradycardia: discontinue lopressor. 5/27- HR in 40s- just for last 12 hours- will recheck with nursing- if still less than 60, will order EKG  20. History of GI bleed: start protonix.  21. Hypotension: resolved, hemoglobin reviewed and stable 22. Neurogenic bladder: bladder program ordered starting 5/11. 23. Stool incontinence: fiber added to bulk stool 24. Screening for vitamin D  deficiency: Vitamin D level 50: start daily supplement 25. Fatigue: Iron reviewed and is 68, HGB slightly improved to 9.9. Continue vitamin D 1,000U daily 26. Elevated chloride: elevating again, increase D5 to 48mls/hour, repeat chloride today 27. Hypernatremia: resolved, increase D5 back to 50 given elevated chloride, repeat Na today.  28. Tachycardia: Toprol XL ordered. Increase to 25mg .           LOS: 24 days A FACE TO FACE EVALUATION WAS PERFORMED  Martha Clan P Saidi Santacroce 01/27/2022, 1:47 PM

## 2022-01-28 DIAGNOSIS — N189 Chronic kidney disease, unspecified: Secondary | ICD-10-CM

## 2022-01-28 DIAGNOSIS — E870 Hyperosmolality and hypernatremia: Secondary | ICD-10-CM

## 2022-01-28 DIAGNOSIS — E875 Hyperkalemia: Secondary | ICD-10-CM

## 2022-01-28 LAB — GLUCOSE, CAPILLARY
Glucose-Capillary: 179 mg/dL — ABNORMAL HIGH (ref 70–99)
Glucose-Capillary: 187 mg/dL — ABNORMAL HIGH (ref 70–99)
Glucose-Capillary: 228 mg/dL — ABNORMAL HIGH (ref 70–99)
Glucose-Capillary: 288 mg/dL — ABNORMAL HIGH (ref 70–99)

## 2022-01-28 LAB — CBC
HCT: 24.9 % — ABNORMAL LOW (ref 36.0–46.0)
Hemoglobin: 8.3 g/dL — ABNORMAL LOW (ref 12.0–15.0)
MCH: 29.2 pg (ref 26.0–34.0)
MCHC: 33.3 g/dL (ref 30.0–36.0)
MCV: 87.7 fL (ref 80.0–100.0)
Platelets: 254 10*3/uL (ref 150–400)
RBC: 2.84 MIL/uL — ABNORMAL LOW (ref 3.87–5.11)
RDW: 15.4 % (ref 11.5–15.5)
WBC: 6.6 10*3/uL (ref 4.0–10.5)
nRBC: 0 % (ref 0.0–0.2)

## 2022-01-28 LAB — BASIC METABOLIC PANEL
Anion gap: 6 (ref 5–15)
BUN: 52 mg/dL — ABNORMAL HIGH (ref 8–23)
CO2: 22 mmol/L (ref 22–32)
Calcium: 8.1 mg/dL — ABNORMAL LOW (ref 8.9–10.3)
Chloride: 108 mmol/L (ref 98–111)
Creatinine, Ser: 3.35 mg/dL — ABNORMAL HIGH (ref 0.44–1.00)
GFR, Estimated: 13 mL/min — ABNORMAL LOW (ref >60)
Glucose, Bld: 169 mg/dL — ABNORMAL HIGH (ref 70–99)
Potassium: 4.7 mmol/L (ref 3.5–5.1)
Sodium: 136 mmol/L (ref 135–145)

## 2022-01-28 MED ORDER — DEXTROSE-NACL 5-0.45 % IV SOLN: INTRAVENOUS | Status: DC

## 2022-01-28 NOTE — Progress Notes (Signed)
Patient ID: Patricia Dudley, female   DOB: 22-Jul-1930, 88 y.o.   MRN: [id number]  Medicaid information sent to patient daughter [place] at [email or web address]

## 2022-01-28 NOTE — NC FL2 (Signed)
[place] MEDICAID FL2 LEVEL OF CARE SCREENING TOOL     IDENTIFICATION  Patient Name: Patricia Dudley Birthdate: 1930-08-12 Sex: female Admission Date (Current Location): 01/03/2022  [facility] [place] Number:  Herbalist and Address:  The [place], [place] [address] [place], [place] [address]      Provider Number:    Attending Physician Name and Address:  Izora Ribas, MD  Relative Name and Phone Number:  [name] [phone]    Current Level of Care:   Recommended Level of Care: [place] Prior Approval Number:    Date Approved/Denied:   PASRR Number: [id number]  Discharge Plan: SNF    Current Diagnoses: Patient Active Problem List   Diagnosis Date Noted   Acute embolic stroke ([place]) 45/62/5638   Malnutrition of moderate degree 12/27/2021   PNA (pneumonia) 12/24/2021   Cerebral thrombosis with cerebral infarction 12/24/2021   Anemia, unspecified 06/13/2017   Diabetic nephropathy ([place]) 06/13/2017   GERD (gastroesophageal reflux disease) 06/13/2017   Hyperlipidemia 06/13/2017   Hypertension 06/13/2017   Osteoporosis 06/13/2017   Acute posthemorrhagic anemia    Gastritis without bleeding    Angiodysplasia of stomach and duodenum with hemorrhage    Acute GI bleeding 02/27/2017   CVA (cerebral vascular accident) ([place]) 12/20/2016   Hypertensive urgency 12/19/2016   GI bleed 08/07/2016   Type 2 diabetes mellitus with stage 3 chronic kidney disease, without long-term current use of insulin ([place]) 11/01/2015   Chronic a-fib ([place]) 09/01/2015   Malignant essential hypertension 07/26/2015   Renal insufficiency 07/26/2015   TIA (transient ischemic attack) 07/26/2015   Vertigo 07/26/2015   Acquired hypothyroidism 07/04/2015    Orientation RESPIRATION BLADDER Height & Weight        Normal Incontinent Weight: 134 lb 0.6 oz (60.8 kg) Height:  5\' 5"  (165.1 cm)  BEHAVIORAL SYMPTOMS/MOOD NEUROLOGICAL BOWEL  NUTRITION STATUS      Incontinent Diet  AMBULATORY STATUS COMMUNICATION OF NEEDS Skin   Extensive Assist Verbally Normal                       Personal Care Assistance Level of Assistance  Bathing, Feeding, Dressing Bathing Assistance: Maximum assistance Feeding assistance: Maximum assistance Dressing Assistance: Maximum assistance     Functional Limitations Info  Hearing, Speech   Hearing Info: Impaired Speech Info: Impaired    SPECIAL CARE FACTORS FREQUENCY  PT (By licensed PT), OT (By licensed OT), Speech therapy     PT Frequency: 5x/week OT Frequency: 5x/week     Speech Therapy Frequency: 5x/week      Contractures      Additional Factors Info  Code Status Code Status Info: DNR             Current Medications (01/28/2022):  This is the current hospital active medication list Current Facility-Administered Medications  Medication Dose Route Frequency Provider Last Rate Last Admin   acetaminophen (TYLENOL) tablet 325-650 mg  325-650 mg Oral Q4H PRN Setzer, Edman Circle, PA-C       alum & mag hydroxide-simeth (MAALOX/MYLANTA) 200-200-20 MG/5ML suspension 30 mL  30 mL Oral Q4H PRN Setzer, Edman Circle, PA-C       amLODipine (NORVASC) tablet 10 mg  10 mg Oral Daily Barbie Banner, PA-C   10 mg at 01/28/22 9373   aspirin EC tablet 81 mg  81 mg Oral Daily Barbie Banner, PA-C   81 mg at 01/27/22 2010   camphor-menthol (SARNA) lotion  Topical PRN Luetta Nutting, FNP       cholecalciferol (VITAMIN D3) tablet 1,000 Units  1,000 Units Oral Daily Raulkar, Clide Deutscher, MD   1,000 Units at 01/28/22 0737   dextrose 5 % solution   Intravenous Continuous Raulkar, Clide Deutscher, MD 50 mL/hr at 01/27/22 1026 New Bag at 01/27/22 1026   diphenhydrAMINE (BENADRYL) 12.5 MG/5ML elixir 12.5-25 mg  12.5-25 mg Oral Q6H PRN Barbie Banner, PA-C       Gerhardt's butt cream   Topical QID Barbie Banner, PA-C   Given at 01/28/22 0738   glipiZIDE (GLUCOTROL) tablet 10 mg  10 mg Oral QPC supper  Izora Ribas, MD   10 mg at 01/27/22 1656   guaiFENesin-dextromethorphan (ROBITUSSIN DM) 100-10 MG/5ML syrup 5-10 mL  5-10 mL Oral Q6H PRN Barbie Banner, PA-C       heparin injection 5,000 Units  5,000 Units Subcutaneous Q8H Barbie Banner, PA-C   5,000 Units at 01/28/22 0544   insulin aspart (novoLOG) injection 0-9 Units  0-9 Units Subcutaneous TID WC Raulkar, Clide Deutscher, MD   2 Units at 01/28/22 0738   insulin glargine-yfgn (SEMGLEE) injection 17 Units  17 Units Subcutaneous QHS Izora Ribas, MD   17 Units at 01/27/22 2236   latanoprost (XALATAN) 0.005 % ophthalmic solution 1 drop  1 drop Both Eyes QHS Barbie Banner, PA-C   1 drop at 01/27/22 2235   levothyroxine (SYNTHROID) tablet 112 mcg  112 mcg Oral Q0600 Barbie Banner, PA-C   112 mcg at 01/28/22 0544   loperamide (IMODIUM) capsule 2 mg  2 mg Oral PRN Barbie Banner, PA-C   2 mg at 01/09/22 1513   loratadine (CLARITIN) tablet 10 mg  10 mg Oral QHS Izora Ribas, MD   10 mg at 01/27/22 2235   magnesium gluconate (MAGONATE) tablet 250 mg  250 mg Oral QHS Raulkar, Clide Deutscher, MD   250 mg at 01/27/22 2235   methocarbamol (ROBAXIN) tablet 500 mg  500 mg Oral Q6H PRN Barbie Banner, PA-C       metoprolol succinate (TOPROL-XL) 24 hr tablet 25 mg  25 mg Oral Daily Raulkar, Clide Deutscher, MD   25 mg at 01/28/22 0737   multivitamin with minerals tablet 1 tablet  1 tablet Oral Daily Barbie Banner, PA-C   1 tablet at 01/28/22 0938   nystatin (MYCOSTATIN) 100000 UNIT/ML suspension 500,000 Units  5 mL Oral QID Izora Ribas, MD   500,000 Units at 01/28/22 0737   pantoprazole (PROTONIX) EC tablet 40 mg  40 mg Oral Daily Raulkar, Clide Deutscher, MD   40 mg at 01/28/22 0737   polycarbophil (FIBERCON) tablet 625 mg  625 mg Oral Daily Izora Ribas, MD   625 mg at 01/28/22 0737   prochlorperazine (COMPAZINE) tablet 5-10 mg  5-10 mg Oral Q6H PRN Barbie Banner, PA-C       Or   prochlorperazine (COMPAZINE) injection 5-10 mg   5-10 mg Intramuscular Q6H PRN Barbie Banner, PA-C       Or   prochlorperazine (COMPAZINE) suppository 12.5 mg  12.5 mg Rectal Q6H PRN Setzer, Edman Circle, PA-C       senna-docusate (Senokot-S) tablet 1 tablet  1 tablet Oral QHS PRN Setzer, Edman Circle, PA-C       sodium chloride 0.45 % bolus 500 mL  500 mL Intravenous Once Luetta Nutting, FNP       sodium polystyrene (KAYEXALATE) 15  GM/60ML suspension 30 g  30 g Oral Once Raulkar, Clide Deutscher, MD       sorbitol 70 % solution 30 mL  30 mL Oral Daily PRN Barbie Banner, PA-C       sorbitol 70 % solution 30 mL  30 mL Oral Daily PRN Raulkar, Clide Deutscher, MD       traZODone (DESYREL) tablet 25-50 mg  25-50 mg Oral QHS PRN Barbie Banner, PA-C   50 mg at 01/05/22 2107   vitamin B-12 (CYANOCOBALAMIN) tablet 100 mcg  100 mcg Oral Daily Barbie Banner, PA-C   100 mcg at 01/28/22 9166     Discharge Medications: Please see discharge summary for a list of discharge medications.  Relevant Imaging Results:  Relevant Lab Results:   Additional Information PRIVATE PAY PATIENT!!! MAY:[id number] Vaccine Comp 10/07/19,11/01/19,06/08/20  Dyanne Iha

## 2022-01-28 NOTE — Progress Notes (Signed)
Occupational Therapy Session Note  Patient Details  Name: Patricia Dudley MRN: [id number] Date of Birth: 06/22/1930  Today's Date: 01/28/2022 OT Individual Time: 3976-7341 OT Individual Time Calculation (min): 49 min    Short Term Goals: Week 3:  OT Short Term Goal 1 (Week 3): Pt will consistently complete 1/3 steps of toileting, with no more than min A for balance to promote independence with OOB toileting OT Short Term Goal 1 - Progress (Week 3): Progressing toward goal OT Short Term Goal 2 (Week 3): Pt will attend to task >10 mins to promote sustained attention needed for ADL task OT Short Term Goal 2 - Progress (Week 3): Progressing toward goal OT Short Term Goal 3 (Week 3): Pt will initiate 2/3 steps with donning shirt, with no more than min A and multimodal cues PRN OT Short Term Goal 3 - Progress (Week 3): Met Week 4:  OT Short Term Goal 1 (Week 4): STG + LTG 2/2 ELOS  Skilled Therapeutic Interventions/Progress Updates:    Patient received sleeping soundly in bed.  Patient took time to waken from sleep.  Patient assisted to roll, then sit at edge of bed such that feet in contact with floor.  Patient unable to maintain unsupported sitting without feet on floor as base of support.  Once feet on floor could sit without support.  Assisted to stand with walker in from to encourage forward weight shift.  Patient initially hesitant with forward weight shift and attempts to stand, leading to premature standing and backward loss of balance.  With more directed forward weight shift - stood with mod assist initially, and min assist subsequent stands.  Walked with RW short distance to straight chair with arms at sink to wash and dress.  Patient engaged in activity x several seconds at a time.  Patient spontaneously reaching forward toward water, once washcloth brought to face (hand over hand) would continue washing.  Patient completed dressing at sink, then transitioned to wheelchair with safety belt  in place and engaged.  Nursing staff in room to pass medications and assist patient to eat breakfast.    Therapy Documentation Precautions:  Precautions Precautions: Fall Precaution Comments: aspiration precautions Restrictions Weight Bearing Restrictions: No   Pain: Pain Assessment Pain Scale: Faces Faces Pain Scale: No hurt      Therapy/Group: Individual Therapy  [name] 01/28/2022, 11:52 AM

## 2022-01-28 NOTE — Progress Notes (Signed)
Physical Therapy Session Note  Patient Details  Name: Patricia Dudley MRN: [id number] Date of Birth: Jan 25, 1930  Today's Date: 01/28/2022 PT Individual Time: 1002-1034 PT Individual Time Calculation (min): 32 min   Today's Date: 01/28/2022 PT Missed Time: 13 Minutes Missed Time Reason: Patient fatigue  Short Term Goals: Week 3:  PT Short Term Goal 1 (Week 3): pt will initate supine<>sit with mod A PT Short Term Goal 1 - Progress (Week 3): Progressing toward goal PT Short Term Goal 2 (Week 3): pt will transfer sit<>stand with mod A consistantly PT Short Term Goal 2 - Progress (Week 3): Progressing toward goal PT Short Term Goal 3 (Week 3): pt will ambulate 51f with LRAD and mod A PT Short Term Goal 3 - Progress (Week 3): Met Week 4:  PT Short Term Goal 1 (Week 4): STG=LTG due to LOS  Skilled Therapeutic Interventions/Progress Updates:   Received pt sitting in TIS WC asleep, pt did not arouse to knocking or to max verbal/tactile stimuli. Therapist removed blanket and pt awoke briefly and kept eyes open <10 seconds with blank stare before nodding back off to sleep - notified treatment team of concerns. Attempted to engage in sit<>stands, however pt clearly unable to functionally participate, therefore decided to return to bed for comfort. Attempted sit<>stand with RW x 2 trials, however pt resisting and unable to follow commands - therefore retrieved St[place]. Pt stood with +2 assist in [place] transferred to bed dependently. Pt transferred sit<>supine with max A. Concluded session with pt semi-reclined in bed, needs within reach, and bed alarm on. 13 minutes missed of skilled physical therapy due to fatigue.   Therapy Documentation Precautions:  Precautions Precautions: Fall Precaution Comments: aspiration precautions Restrictions Weight Bearing Restrictions: No  Therapy/Group: Individual Therapy AAlfonse AlpersPT, DPT  01/28/2022, 7:02 AM

## 2022-01-28 NOTE — Progress Notes (Signed)
Occupational Therapy Session Note  Patient Details  Name: Patricia Dudley MRN: [id number] Date of Birth: 1929-12-14  Today's Date: 01/28/2022 OT Individual Time: 1300-1410 OT Individual Time Calculation (min): 70 min    Short Term Goals: Week 4:  OT Short Term Goal 1 (Week 4): STG + LTG 2/2 ELOS  Skilled Therapeutic Interventions/Progress Updates:  Skilled OT intervention completed with focus on motor planning, initiation, and awareness in relation to self-feeding. Pt received seated in TIS chair, with nurse in room reporting pt covered her mouth when NT offered feeding lunch and pt had not consumed dinner and now lunch. Pt minimally verbal during session, however smiling and shaking head "no" to questions asked therefor NAD noted or pain. Focus of session with emphasis on self-feeding and oral care for maximizing pt's overall well-being and independence with basic level self-care tasks. Therapist followed SLP's recommendation for diet D1/nectar thick precautions. Pt with improved ability to initiate and follow through with self feeding this session; but still requires extra time. Pt did take session to eat, only consuming about 30% total of pureed foods and 20% of magic cup. Tactile cuing needed 50% of the time scoop bite onto utensil. Pt continues to need setup of meals; including seperating her liquids from her solids. We started the meal with solids and ended with liquids. No pocketing noted, with pt noticeably making throat clearing noises in between bites. Oral care completed with total A, with max cues needed for opening mouth however pt willing to "cheese" for teeth cleaning, and stuck out tongue once but attempted biting sponge with cues needed to reopen. Seated at sink, pt washed hands with multimodal cues needed for lathering soap and total A needed for rinsing hands. Pt was left seated in w/c, with belt alarm on, daughter in room nurse aware of toileting need and all needs in reach at end  of session.   Therapy Documentation Precautions:  Precautions Precautions: Fall Precaution Comments: aspiration precautions Restrictions Weight Bearing Restrictions: No    Therapy/Group: Individual Therapy  Jalani Rominger E Ivionna Verley 01/28/2022, 7:56 AM

## 2022-01-28 NOTE — Progress Notes (Signed)
Patient ID: Patricia Dudley, female   DOB: 1929/12/10, 88 y.o.   MRN: [id number]  Suction Kit ordered through [employer].

## 2022-01-28 NOTE — Progress Notes (Signed)
Speech Language Pathology Daily Session Note  Patient Details  Name: Patricia Dudley MRN: [id number] Date of Birth: 1930-08-07  Today's Date: 01/28/2022 SLP Individual Time: 5852-7782 SLP Individual Time Calculation (min): 25 min  Short Term Goals: Week 4: SLP Short Term Goal 1 (Week 4): STG=LTG due to ELOS (6/7)  Skilled Therapeutic Interventions: Pt seen this date for skilled ST intervention targeting communication goals outlined in care plan. Pt received lethargic/fatigued and OOB in TIS. Non-verbal with minimal facial expressions elicited. Of note, pt with minimal participation.  SLP facilitated today's session by providing Total A for oral care via suctioning toothbrush and [place] given evidence of oral holding/pocketing following AM meal which pt had just completed with NT prior to SLP arrival. Pt followed commands within the context of functional ADL tasks ("wipe face," "open mouth," "blow your nose," "rub your lips together") ~40% of the time given Max multimodal A. Upon request, pt selected specific object (presented in a field of 2) needed to complete familiar ADL tasks on 1 out of 4 opportunities given Min A; Total A on the remainder of trials. Pt communicated wants/needs x 2 (verbal "no" and head shake "yes") given Max multimodal A. Otherwise, pt was non-verbal. No family present for education.  Pt left in TIS with all safety measures activated. Continue per current ST POC.  Pain No indication of pain; pt unable to report  Therapy/Group: Individual Therapy  Luvenia Cranford A Loriana Samad 01/28/2022, 1:18 PM

## 2022-01-28 NOTE — Progress Notes (Signed)
   Palliative Medicine Inpatient Follow Up Note  HPI: 88 y.o. 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    PMT has been consulted to assist with goals of care conversation regarding nutrition given continued high risk of aspiration with thin liquids. Diet advanced to dysphagia 3/pured meats with thin liquids on 5/4, but downgraded back to dysphagia 1 diet, crushed meds on 5/5.   Today's Discussion 01/28/2022  *Please note that this is a verbal dictation therefore any spelling or grammatical errors are due to the "[place]" system interpretation.  Chart reviewed inclusive of vital signs, progress notes, laboratory results, and diagnostic images.   I met at bedside with Arelly this afternoon. She was sitting in her wheelchair in no distress. Her daughters Santiago Glad and [name] are present at the bedside.  Continued goals of care conversation after family has heard from [facility] regarding their hospice services and she does not currently quality for inpatient hospice. They confirm with me that they would be unable to care for her at home with limited hospice support and are proceeding with SNF placement while they continue to plan for her caregiver needs. Outpatient palliative care referral was explained and offered. Family is agreeable.  Questions and concerns addressed. PMT will continue to support holistically.   Objective Assessment: Vital Signs Vitals:   01/27/22 1941 01/28/22 0404  BP: 123/71 137/90  Pulse: 86 99  Resp: 18 18  Temp: (!) 97.5 F (36.4 C) 97.9 F (36.6 C)  SpO2: 100% 100%    Gen: Frail elderly African-American female  sitting in wheelchair CV: Regular rate and rhythm PULM: On room air EXT: No edema Neuro: alert   SUMMARY OF RECOMMENDATIONS   DNAR/DNI Patient's family has decided for SNF with palliative care referral given that she does not quality for inpatient hospice and they are unable to care for her at home Psychosocial and emotional support provided PMT will continue to follow peripherally. Please secure chat or call team line should urgent needs arise  MDM: High ______________________________________________________________________________________ Dorthy Cooler, [place] Team Team Cell Phone: [phone] Please utilize secure chat with additional questions, if there is no response within 30 minutes please call the above phone number  Palliative Medicine Team providers are available by phone from 7am to 7pm daily and can be reached through the team cell phone.  Should this patient require assistance outside of these hours, please call the patient's attending physician.

## 2022-01-28 NOTE — Progress Notes (Signed)
Occupational Therapy Discharge Summary  Patient Details  Name: Patricia Dudley MRN: [id number] Date of Birth: 1930-02-23   Patient has met 10 of 11 long term goals due to improved activity tolerance, improved balance, functional use of  RIGHT upper and RIGHT lower extremity, and improved attention.  Patient to discharge at overall Mod Assist level, with initial plans to discharge home with pt's 8 daughters to provide 24/7 care for her, however after consulting with palliative services and receiving education on pt's current status, pt is to discharge to SNF of the family's choice with potential outpatient palliative services upon d/c.  Patient's 8 daughters have completed multiple hands on training sessions and have demonstrated and communicated the physical ability to care for pt up to this point for both physical and cognitive assistance at discharge.    Reasons goals not met: bathing goal not met at mod A level vs min A for UB and Mod A for LB 2/2 impaired motor planning, consistent cues required despite pt's physical abilities.  Recommendation:  Patient will benefit from ongoing skilled OT services in skilled nursing facility setting to continue to advance functional skills in the area of BADL and Reduce care partner burden.  Equipment: Already has 3 in 1 BSC at home, however d/c SNF therefore no equipment needed  Reasons for discharge: treatment goals met  Patient/family agrees with progress made and goals achieved: Yes  OT Discharge Precautions/Restrictions  Precautions Precautions: Fall Precaution Comments: aspiration precautions Restrictions Weight Bearing Restrictions: No ADL ADL Eating: Moderate assistance Where Assessed-Eating: Wheelchair Grooming: Moderate assistance Where Assessed-Grooming: Sitting at sink Upper Body Bathing: Minimal assistance Where Assessed-Upper Body Bathing: Shower Lower Body Bathing: Moderate assistance Where Assessed-Lower Body Bathing:  Shower Upper Body Dressing: Minimal assistance Where Assessed-Upper Body Dressing: Sitting at sink Lower Body Dressing: Moderate assistance Where Assessed-Lower Body Dressing: Standing at sink, Sitting at sink Toileting: Moderate assistance Where Assessed-Toileting: Bedside Commode, Toilet Toilet Transfer: Minimal assistance Toilet Transfer Method: Stand pivot Toilet Transfer Equipment: Bedside commode, Grab bars Tub/Shower Transfer: Unable to assess Tub/Shower Transfer Method: Unable to assess Social research officer, government: Moderate assistance Social research officer, government Method: Public house manager with back, Grab bars ADL Comments: ADL levels reflect pt's highest level of performance during rehab, but consistently fluctuates, requiring more than the listed assist level at times Vision Baseline Vision/History: 3 Glaucoma Vision Assessment?: Vision impaired- to be further tested in functional context Perception  Perception: Impaired Inattention/Neglect: Does not attend to right visual field Praxis Praxis: Impaired Praxis Impairment Details: Initiation;Motor planning Cognition Cognition Overall Cognitive Status: Difficult to assess Arousal/Alertness: Lethargic Orientation Level: Nonverbal/unable to assess Memory: Impaired Attention: Focused Focused Attention: Impaired Awareness: Impaired Problem Solving: Impaired Safety/Judgment: Impaired Comments: Pt remains mostly non-verbal; fluctuates with mobility/ADL tasks as well as speaking/communication abilities Brief Interview for Mental Status (BIMS) Repetition of Three Words (First Attempt): No answer (pt remains non-verbal and unable to complete even when given choices to select from 2/2 aphasia) Temporal Orientation: Year: No answer Temporal Orientation: Month: No answer Temporal Orientation: Day: No answer Recall: "Sock": No answer Recall: "Blue": No answer Recall: "Bed": No answer BIMS Summary Score:  99 Sensation Sensation Light Touch: Appears Intact Hot/Cold: Appears Intact Proprioception: Impaired by gross assessment Additional Comments: difficult to assess 2/2 pt being non-verbal, however alerts to tactile stimuli with RUE Coordination Gross Motor Movements are Fluid and Coordinated: No Fine Motor Movements are Fluid and Coordinated: No Coordination and Movement Description: generalized weakness, instability 2/2 poor motor planning  and command following Finger Nose Finger Test: Unable to complete formally 2/2 impaired motor planning and communication deficits, with pt demonstrating incoordination during ADL tasks bilaterally Motor  Motor Motor: Hemiplegia;Other (comment) Motor - Skilled Clinical Observations: R hemiparesis, impaired motor planning/sequencing, poor initation/attention and ability to follow commands. Mobility  Bed Mobility Bed Mobility: Rolling Right;Rolling Left;Sit to Supine;Supine to Sit Rolling Right: Moderate Assistance - Patient 50-74% Rolling Left: Moderate Assistance - Patient 50-74% Supine to Sit: Maximal Assistance - Patient - Patient 25-49% Sit to Supine: Maximal Assistance - Patient 25-49% Transfers Sit to Stand: Minimal Assistance - Patient > 75% (but can fluctuate and require max A of 1 up to mod A of 2) Stand to Sit: Contact Guard/Touching assist  Trunk/Postural Assessment  Cervical Assessment Cervical Assessment: Exceptions to [facility] (cervical kyphosis) Thoracic Assessment Thoracic Assessment: Exceptions to [facility] (kyphotic) Lumbar Assessment Lumbar Assessment: Exceptions to [facility] (sacral sitting) Postural Control Postural Control: Deficits on evaluation Head Control: inadequate Trunk Control: posterior lean in sitting when first waking up Righting Reactions: delayed Protective Responses: significantly delayed/sometimes absent  Balance Balance Balance Assessed: Yes Static Sitting Balance Static Sitting - Balance Support: Bilateral upper  extremity supported;Feet supported Static Sitting - Level of Assistance: 5: Stand by assistance Dynamic Sitting Balance Dynamic Sitting - Balance Support: Feet supported;No upper extremity supported Dynamic Sitting - Level of Assistance: 5: Stand by assistance Static Standing Balance Static Standing - Balance Support: Bilateral upper extremity supported;During functional activity Static Standing - Level of Assistance: 5: Stand by assistance Dynamic Standing Balance Dynamic Standing - Balance Support: Bilateral upper extremity supported;During functional activity Dynamic Standing - Level of Assistance: 4: Min assist Dynamic Standing - Comments: Can require as little as min A for transfers and gait, but flucutates and can require up to mod/max of 2 Extremity/Trunk Assessment RUE Assessment RUE Assessment: Exceptions to [facility] Active Range of Motion (AROM) Comments: WFL for ADL tasks General Strength Comments: 4/5 proximally during functional tasks LUE Assessment LUE Assessment: Exceptions to [facility] Active Range of Motion (AROM) Comments: WFL during ADL tasks General Strength Comments: 4/5 proximally during functional tasks   Akshaj Besancon E Montavious Wierzba, MS, OTR/L  01/28/2022, 2:56 PM

## 2022-01-28 NOTE — Progress Notes (Signed)
PROGRESS NOTE   Subjective/Complaints: Family considering SNF discharge. No concerns elicited.   ROS. Limited by non-verbal  Objective:   No results found. Recent Labs    01/28/22 0627  WBC 6.6  HGB 8.3*  HCT 24.9*  PLT 254      Recent Labs    01/27/22 1449 01/28/22 0627  NA 138 136  K 5.7* 4.7  CL 108 108  CO2 20* 22  GLUCOSE 234* 169*  BUN 58* 52*  CREATININE 3.32* 3.35*  CALCIUM 8.3* 8.1*      Intake/Output Summary (Last 24 hours) at 01/28/2022 1253 Last data filed at 01/28/2022 0900 Gross per 24 hour  Intake 265 ml  Output --  Net 265 ml          Physical Exam: Vital Signs Blood pressure 137/90, pulse 99, temperature 97.9 F (36.6 C), resp. rate 18, height 5\' 5"  (1.651 m), weight 60.8 kg, SpO2 100 %. Constitutional: No distress . Vital signs reviewed. BMI 22.32. sitting in chair HEENT: NCAT, EOMI, oral membranes moist Neck: supple Cardiovascular: Tachycardia Respiratory/Chest: CTA Bilaterally without wheezes or rales. Normal effort    GI/Abdomen: BS +, non-tender, non-distended,soft Ext: Right upper extremity swelling from IV infiltration Psych: flat but cooperates Musculoskeletal:     Cervical back: Neck supple. Protracted posture    Comments: nods head, follows simple commands. Remains non-verbal. L grip/biceps 5-/5 BUE: 3/5 grip/biceps.  No appreciable RLE and LLE movement. Skin:    General: Skin is warm and dry.  Neurological:       Alert,  Remains non-verbal. L grip/biceps 5-/5, did not move right side for moe. Delayed processing Stand pivot transfer MinA Needs set-up for meals Functional mobility: holds Ensure and drinks herself with straw Psychiatric:     Comments: Smiling,  +echolalia    Assessment/Plan: 1. Functional deficits which require 3+ hours per day of interdisciplinary therapy in a comprehensive inpatient rehab setting. Physiatrist is providing close team supervision  and 24 hour management of active medical problems listed below. Physiatrist and rehab team continue to assess barriers to discharge/monitor patient progress toward functional and medical goals  Care Tool:  Bathing    Body parts bathed by patient: Right arm, Left arm, Chest, Face, Abdomen, Right upper leg, Left upper leg   Body parts bathed by helper: Front perineal area, Buttocks, Right lower leg, Left lower leg Body parts n/a: Front perineal area, Buttocks   Bathing assist Assist Level: Maximal Assistance - Patient 24 - 49%     Upper Body Dressing/Undressing Upper body dressing   What is the patient wearing?: Bra, Pull over shirt    Upper body assist Assist Level: Maximal Assistance - Patient 25 - 49%    Lower Body Dressing/Undressing Lower body dressing      What is the patient wearing?: Incontinence brief, Pants     Lower body assist Assist for lower body dressing: Maximal Assistance - Patient 25 - 49%     Toileting Toileting    Toileting assist Assist for toileting: Moderate Assistance - Patient 50 - 74%     Transfers Chair/bed transfer  Transfers assist     Chair/bed transfer assist level: Maximal Assistance -  Patient 25 - 49%     Locomotion Ambulation   Ambulation assist   Ambulation activity did not occur: Safety/medical concerns  Assist level: Minimal Assistance - Patient > 75% Assistive device: Walker-rolling Max distance: 74ft   Walk 10 feet activity   Assist  Walk 10 feet activity did not occur: Safety/medical concerns  Assist level: Minimal Assistance - Patient > 75% Assistive device: Walker-rolling   Walk 50 feet activity   Assist Walk 50 feet with 2 turns activity did not occur: Safety/medical concerns         Walk 150 feet activity   Assist Walk 150 feet activity did not occur: Safety/medical concerns         Walk 10 feet on uneven surface  activity   Assist Walk 10 feet on uneven surfaces activity did not occur:  Safety/medical concerns         Wheelchair     Assist Is the patient using a wheelchair?: No   Wheelchair activity did not occur: Safety/medical concerns         Wheelchair 50 feet with 2 turns activity    Assist    Wheelchair 50 feet with 2 turns activity did not occur: Safety/medical concerns       Wheelchair 150 feet activity     Assist  Wheelchair 150 feet activity did not occur: Safety/medical concerns       Blood pressure 137/90, pulse 99, temperature 97.9 F (36.6 C), resp. rate 18, height 5\' 5"  (1.651 m), weight 60.8 kg, SpO2 100 %.    Medical Problem List and Plan: 1. 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             -patient may  shower             -ELOS/Goals: expected dc 6/7 min A to mod A and Max a for auditory comprehension and Total A for verbal comprehension  -Continue CIR therapies including PT, OT, and SLP   Family updated on Saturday, hospice liaison consulted for inpatient hospice  -Family considering SNF 2.  Anemia: continue Heparin Cochrane TID. Transfused 1U on 5/15. Post-transfusion hemoglobin trending upward nicely, repeat Monday              -antiplatelet therapy: aspirin 81 mg daily  -Anemia Stable at HGB 8.3, continue to follow 3. Pain Management: Tylenol prn 4. Mood: LCSW to evaluate and provide emotional support             -antipsychotic agents: n/a 5. Neuropsych: This patient is not capable of making decisions on her own behalf. 6. Skin/Wound Care: Routine skin care checks 7. Dysphagia             -- continue Dysphagia 1 diet with thin liquids.  Crushed meds with applesauce.  SLP evaluation. MBS discussed with SLP and shows high risk of aspiration with thin liquids. Discussed with daughter and SLP thickened smoothies at home for good nutrition             -- Continue nutritional supplements, B12, MVI   8: Paroxysmal atrial fibrillation: continue Lopressor 50 mg  BID --home torsemide on hold --no AC due to small bowel AVMs Add magnesium gluconate 250mg  HS    01/28/2022    4:04 AM 01/27/2022    7:41 PM  Vitals with BMI  Systolic 409 735  Diastolic 90 71  Pulse 99 86    9: Diabetes mellitus: CBGs qAC,qHS; monitor intake and  activity --continue SSI --increase glipizide back to 10mg  (home dose)- changed timing to after supper due to AM hypoglycemia and elevated CBGs HS Increase Semglee to 15U started HS D/c Ensure between meals D/c HS sliding scale due to AM hypoglycemia.  5/28- Cbgs very labile- 101 this AM, but 200s-300s otherwise- will increase Semglee to 14 (from 12) units and monitor 5/29- cbgs still labile--semglee just increased--observe today  -may need am dose of semglee 10: Hypertension: d/c Lisinopril due to worsening kidney function, continue amlodipine 10 mg, Lopressor. Repeat creatinine on Monday given increase in Lisinopril.  5/13 BP controlled today. 5/14 some variability in B/P, with mild elevation, some episodes of bradycardia (HR 46 at 051 today). -D/c lisinopril today since BUN 28->68 and Crt 2.28->3.20, with increase in past 2 days.  Hold lopressor with bradycardia episodes.  Will see how B/P trends on amlodipine 10 mg, may consider adding hydralazine if B/p is elevated.   -now hypotensive, change fluids to NS HS 12 hours -5/22 BP a little stable currently, will continue to monitor 5/27- BP slightly elevated in 150s, however off ACE inhibitor- likely up since beocming more euvolemic. Will monitor for trend.  5/29 bp under fair to reasonable control 6/5 BP well controlled    01/28/2022    4:04 AM 01/27/2022    7:41 PM  Vitals with BMI  Systolic 557 322  Diastolic 90 71  Pulse 99 86   11: Hyperlipidemia: continue rosuvastatin 10 mg 12: Moderate malnutrition: continue supplements 13: Chronic kidney disease stage 3b: follow-up BMP     -5/13 B/P is in the 120's today. Pt had lisinopril increased to 40 mg yesterday for elevated  B/P.  Since lisinopril may increase Crt, decrease lisinopril to 20 mg daily today and push po intake and repeat BMP. -5/14 Crt up to 3.2 and BUN up to 68.  Fluid bolus (1L NS) and continuous fluid (0.45 NS at 75 ml/hr) given for rise in BUN and Crt. -d/c lisinopril (per Dr. Christella Noa) since could be a possible cause of elevated Crt . Cr elevated, nephrology consulted. Urine studies obtained, creatinine improving, continue D5W 5/27- Cr down to 2.75 from 3.01- Renal on board- BUN 47 5/29 bun improved to 48, Cr still 2.75--renal has signed off.  6/5 Cr elevated to 3.35 today, discussed with nephrology, will change fluids to D5 1/2 normal saline at 75 ml/hr    Latest Ref Rng & Units 01/28/2022    6:27 AM 01/27/2022    2:49 PM 01/26/2022    6:33 AM  BMP  Glucose 70 - 99 mg/dL 169   234   82    BUN 8 - 23 mg/dL 52   58   61    Creatinine 0.44 - 1.00 mg/dL 3.35   3.32   2.93    Sodium 135 - 145 mmol/L 136   138   145    Potassium 3.5 - 5.1 mmol/L 4.7   5.7   5.2    Chloride 98 - 111 mmol/L 108   108   114    CO2 22 - 32 mmol/L 22   20   25     Calcium 8.9 - 10.3 mg/dL 8.1   8.3   8.9      14: Glaucoma: continue latanoprost drops 15: Hypothyroidism: continue Synthroid 16: Diarrhea/loose stools: monitor; getting loperamide 2 mg daily (BID prn) -continue current regimen. 17: Bowel and bladder incontinence: start bowel and bladder program 18. Daytime somnolence: [employer] and SW if therapy  can be scheduled a little later in the day and if daughters can be present to help motivate her  5/27- still sleepy- might be worth looking into Ritalin? 19. Bradycardia: discontinue lopressor. 5/27- HR in 40s- just for last 12 hours- will recheck with nursing- if still less than 60, will order EKG  20. History of GI bleed: start protonix.  21. Hypotension: resolved, hemoglobin reviewed and stable 22. Neurogenic bladder: bladder program ordered starting 5/11. 23. Stool incontinence: fiber added to bulk  stool 24. Screening for vitamin D deficiency: Vitamin D level 50: start daily supplement 25. Fatigue: Iron reviewed and is 68, HGB slightly improved to 9.9. Continue vitamin D 1,000U daily 26. Elevated chloride: elevating again, increase D5 to 39mls/hour, repeat chloride today 27. Hypernatremia: resolved, increase D5 back to 50 given elevated chloride, repeat Na today.  6/5 Na down to 136, will change to D5 1/2 normal saline at 75 ml/hr, repeat labs 28. Tachycardia: Toprol XL ordered. Increase to 25mg .  29. Hyperkalemia  -6/5 K+ 4.7 today, she had insulin          LOS: 25 days A FACE TO FACE EVALUATION WAS PERFORMED  Jennye Boroughs 01/28/2022, 12:53 PM

## 2022-01-28 NOTE — Progress Notes (Signed)
Patient ID: Patricia Dudley, female   DOB: 1930-08-07, 88 y.o.   MRN: [id number]  SW spoke with patient daughter, [name] and discussed the plans for patient discharge. Daughter feel as they cannot care for patient at home and all family members will not be active in patient care as needed. Family offering to pay privately for a SNF. SW sent out patient referral to facilities.

## 2022-01-29 ENCOUNTER — Inpatient Hospital Stay (HOSPITAL_COMMUNITY): Payer: Medicare PPO

## 2022-01-29 DIAGNOSIS — R609 Edema, unspecified: Secondary | ICD-10-CM

## 2022-01-29 LAB — BASIC METABOLIC PANEL
Anion gap: 7 (ref 5–15)
BUN: 57 mg/dL — ABNORMAL HIGH (ref 8–23)
CO2: 23 mmol/L (ref 22–32)
Calcium: 8.1 mg/dL — ABNORMAL LOW (ref 8.9–10.3)
Chloride: 110 mmol/L (ref 98–111)
Creatinine, Ser: 3.08 mg/dL — ABNORMAL HIGH (ref 0.44–1.00)
GFR, Estimated: 14 mL/min — ABNORMAL LOW (ref >60)
Glucose, Bld: 164 mg/dL — ABNORMAL HIGH (ref 70–99)
Potassium: 4.6 mmol/L (ref 3.5–5.1)
Sodium: 140 mmol/L (ref 135–145)

## 2022-01-29 LAB — GLUCOSE, CAPILLARY
Glucose-Capillary: 134 mg/dL — ABNORMAL HIGH (ref 70–99)
Glucose-Capillary: 281 mg/dL — ABNORMAL HIGH (ref 70–99)
Glucose-Capillary: 225 mg/dL — ABNORMAL HIGH (ref 70–99)
Glucose-Capillary: 302 mg/dL — ABNORMAL HIGH (ref 70–99)

## 2022-01-29 MED ORDER — ROSUVASTATIN CALCIUM 10 MG PO TABS
10.0000 mg | ORAL_TABLET | Freq: Every day | ORAL | 0 refills | Status: DC
Start: 2022-01-29 — End: 2022-03-19

## 2022-01-29 MED ORDER — PANTOPRAZOLE SODIUM 40 MG PO TBEC: 40.0000 mg | DELAYED_RELEASE_TABLET | Freq: Two times a day (BID) | ORAL | 0 refills | Status: DC

## 2022-01-29 MED ORDER — LEVOTHYROXINE SODIUM 112 MCG PO TABS
112.0000 ug | ORAL_TABLET | ORAL | 0 refills | Status: AC
Start: 2022-01-29 — End: ?

## 2022-01-29 MED ORDER — LORATADINE 10 MG PO TABS
10.0000 mg | ORAL_TABLET | Freq: Every day | ORAL | 0 refills | Status: DC
Start: 2022-01-29 — End: 2022-03-19

## 2022-01-29 MED ORDER — METHOCARBAMOL 500 MG PO TABS: 500.0000 mg | ORAL_TABLET | Freq: Four times a day (QID) | ORAL | 0 refills | Status: DC | PRN

## 2022-01-29 MED ORDER — ADULT MULTIVITAMIN W/MINERALS CH: 1.0000 | ORAL_TABLET | Freq: Every day | ORAL | Status: DC

## 2022-01-29 MED ORDER — TRAZODONE HCL 50 MG PO TABS: 25.0000 mg | ORAL_TABLET | Freq: Every evening | ORAL | 0 refills | Status: DC | PRN

## 2022-01-29 MED ORDER — INSULIN GLARGINE-YFGN 100 UNIT/ML ~~LOC~~ SOLN
17.0000 [IU] | Freq: Every day | SUBCUTANEOUS | 11 refills | Status: DC
Start: 2022-01-29 — End: 2022-05-14

## 2022-01-29 MED ORDER — AMLODIPINE BESYLATE 5 MG PO TABS: 10.0000 mg | ORAL_TABLET | Freq: Every day | ORAL | 0 refills | Status: DC

## 2022-01-29 MED ORDER — CALCIUM POLYCARBOPHIL 625 MG PO TABS
625.0000 mg | ORAL_TABLET | Freq: Every day | ORAL | 0 refills | Status: DC
Start: 2022-01-29 — End: 2022-05-14

## 2022-01-29 MED ORDER — METOPROLOL SUCCINATE ER 25 MG PO TB24: 25.0000 mg | ORAL_TABLET | Freq: Every day | ORAL | 0 refills | Status: DC

## 2022-01-29 MED ORDER — MAGNESIUM GLUCONATE 500 MG PO TABS
250.0000 mg | ORAL_TABLET | Freq: Every day | ORAL | 0 refills | Status: DC
Start: 2022-01-29 — End: 2022-03-19

## 2022-01-29 MED ORDER — ACETAMINOPHEN 325 MG PO TABS: 325.0000 mg | ORAL_TABLET | ORAL | Status: AC | PRN

## 2022-01-29 MED ORDER — VITAMIN B-12 100 MCG PO TABS: 100.0000 ug | ORAL_TABLET | Freq: Every day | ORAL | 0 refills | Status: DC

## 2022-01-29 MED ORDER — VITAMIN D 25 MCG (1000 UNIT) PO TABS: 1000.0000 [IU] | ORAL_TABLET | Freq: Every day | ORAL | 0 refills | Status: DC

## 2022-01-29 MED ORDER — GLIPIZIDE 10 MG PO TABS
10.0000 mg | ORAL_TABLET | Freq: Every day | ORAL | Status: DC
Start: 2022-01-29 — End: 2022-05-14

## 2022-01-29 NOTE — Progress Notes (Incomplete)
Inpatient Rehabilitation Discharge Medication Review by a Pharmacist  A complete drug regimen review was completed for this patient to identify any potential clinically significant medication issues.  High Risk Drug Classes Is patient taking? Indication by Medication  Antipsychotic {Receiving?:26196}   Anticoagulant {Receiving?:26196}   Antibiotic {Receiving?:26196}   Opioid {Receiving?:26196}   Antiplatelet {Receiving?:26196} Aspirin: stroke ppx  Hypoglycemics/insulin {Yes or No?:26198} Insulin aspart, glipizide: diabetes  Vasoactive Medication {Receiving?:26196} Amlodipine, lisinopril, metoprolol: hypertension  Chemotherapy {Receiving Chemo?:26197}   Other {Yes or No?:26198} Alendronate: bone support Niferex, cyanocobalamin: supplementation Latanoprost: glaucoma Levothyroxine: hypothyoid tx Pantoprazole: GERd Rosuvastatin: hyperlipidemia     Type of Medication Issue Identified Description of Issue Recommendation(s)  Drug Interaction(s) (clinically significant)     Duplicate Therapy     Allergy     No Medication Administration End Date     Incorrect Dose     Additional Drug Therapy Needed     Significant med changes from prior encounter (inform family/care partners about these prior to discharge). Medications discontinued: - atorvastatin, torsemide  New medications: - aspirin Communicate medication changes with patient/family at discharge  Other       Clinically significant medication issues were identified that warrant physician communication and completion of prescribed/recommended actions by midnight of the next day:  {Yes or No?:26198}  Name of provider notified for urgent issues identified: ***   Provider Method of Notification: ***    Pharmacist comments: ***   Time spent performing this drug regimen review (minutes): 20   Thank you for allowing pharmacy to be a part of this patient's care.  Ardyth Harps, PharmD Clinical Pharmacist

## 2022-01-29 NOTE — Progress Notes (Signed)
Speech Language Pathology Daily Session Note  Patient Details  Name: Patricia Dudley MRN: [id number] Date of Birth: 11-17-1929  Today's Date: 01/29/2022 SLP Individual Time: 0730-0800 SLP Individual Time Calculation (min): 30 min  Short Term Goals: Week 4: SLP Short Term Goal 1 (Week 4): STG=LTG due to ELOS (6/7)  Skilled Therapeutic Interventions: Pt seen this date for skilled ST intervention targeting communication goals outlined in care plan. Pt received fatigued and lying in bed. Flat affect noted. Brief appeared soaked though pt unable to communicate need/desire to be changed despite Max multimodal A. Total A for orientation to time. Participatory in [place] intervention with Max A.    SLP facilitated today's session by providing Max faded to Mod multimodal A (to include visual supports/pictures of upcoming action/activity) to follow single-step commands during functional transfer with use of St[place] and Mod A + 2 (SLP and RN for safety) from bed to [facility] in bathroom and BSC to TIS. Pt did not void on toliet. Total A for changing brief. Answered simple, semi-automatic yes/no questions via head gestures re: sleep and hunger with pt responding x 3 (<20% of the time) given explicit instruction to answer "yes or on" and verbal repetition/re-presentation of question when needed. Vocalization x 1 to yes/no question only; no additional vocalization nor verbalizations appreciated. Smiled towards the end of today's session after brief was changed and she was transferred into TIS. Prior to staff hand off, SLP provided education to NT re: aspiration precautions posted at [facility], in addition to updating medication administration to crushed in honey-thick liquid via tsp in light of poor oral acceptance of crushed meds in puree; RN aware and reporting this helps with pt's acceptance of medications.  Pt left in TIS with direct hand off to NT for feeding. Continue per current ST POC.  Pain No indication of  pain.  Therapy/Group: Individual Therapy  Darrious Youman A Riordan Walle 01/29/2022, 12:04 PM

## 2022-01-29 NOTE — Progress Notes (Signed)
   Palliative Medicine Inpatient Follow Up Note  HPI: 88 y.o. 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    PMT has been consulted to assist with goals of care conversation regarding nutrition given continued high risk of aspiration with thin liquids. Diet advanced to dysphagia 3/pured meats with thin liquids on 5/4, but downgraded back to dysphagia 1 diet, crushed meds on 5/5.   Today's Discussion 01/29/2022  *Please note that this is a verbal dictation therefore any spelling or grammatical errors are due to the "[place] [employer]" system interpretation.  Medical records reviewed. Patient assessed at the bedside.  She is in no acute distress.  No family present during my visit.  Received a call from [name]/[facility]CPOA with request to discuss thoughts on prognosis and patient's overall condition.  We reviewed her ongoing weakness which seems to be worsening.  Reviewed ongoing issues with dysphagia and that this will likely worsen with patient's underlying dementia.  I shared my concern that she will not make much more progress and that hospice would be appropriate if aligned with goals of care.  [name]'s priority is to ensure she has enough assistance, as family cannot care for her at home.  She would be open to residential hospice in [place], but is looking into SNF because patient will have to discharge very soon.  We discussed option of another review by hospice and she is agreeable to finding out if patient is now eligible.  I then provided update that patient is pending after review today and will be reevaluated again tomorrow.  [name] tells me she is already updated  all family with the plan to pursue SNF with palliative care.  She does not want to shocked them with any changes to the plan, but is interested to find out what hospice will say tomorrow.  She understands that patient will likely need to go to hospice after some time in [facility].  Questions and concerns addressed. PMT will continue to support holistically.   Objective Assessment: Vital Signs Vitals:   01/28/22 0404 01/28/22 1939  BP: 137/90 (!) 148/74  Pulse: 99 90  Resp: 18 18  Temp: 97.9 F (36.6 C) 98.3 F (36.8 C)  SpO2: 100% 100%    Gen: Frail elderly African-American female, NAD CV: Regular rate and rhythm PULM: On room air EXT: No edema Neuro: alert   SUMMARY OF RECOMMENDATIONS   DNAR/DNI Patient's daughter [name] requests another review by residential hospice, discussed with liaison Roselee Nova Family will likely still pursue SNF with palliative but looking into all options Psychosocial and emotional support provided PMT will continue to follow and support  MDM: High ______________________________________________________________________________________ Dorthy Cooler, PA-C [facility] Team Team Cell Phone: [phone] Please utilize secure chat with additional questions, if there is no response within 30 minutes please call the above phone number  Palliative Medicine Team providers are available by phone from 7am to 7pm daily and can be reached through the team cell phone.  Should this patient require assistance outside of these hours, please call the patient's attending physician.

## 2022-01-29 NOTE — Progress Notes (Signed)
PROGRESS NOTE   Subjective/Complaints: Alert and smiling this morning Aide next to her Swelling in right arm has decreased Labs appear stable  ROS. Limited by non-verbal  Objective:   No results found. Recent Labs    01/28/22 0627  WBC 6.6  HGB 8.3*  HCT 24.9*  PLT 254      Recent Labs    01/28/22 0627 01/29/22 0522  NA 136 140  K 4.7 4.6  CL 108 110  CO2 22 23  GLUCOSE 169* 164*  BUN 52* 57*  CREATININE 3.35* 3.08*  CALCIUM 8.1* 8.1*     Intake/Output Summary (Last 24 hours) at 01/29/2022 1031 Last data filed at 01/29/2022 0900 Gross per 24 hour  Intake 1074.99 ml  Output --  Net 1074.99 ml         Physical Exam: Vital Signs Blood pressure (!) 148/74, pulse 90, temperature 98.3 F (36.8 C), resp. rate 18, height 5\' 5"  (1.651 m), weight 60.8 kg, SpO2 100 %. Constitutional: No distress . Vital signs reviewed. BMI 22.32. sitting in chair, alert and smiling HEENT: NCAT, EOMI, oral membranes moist Neck: supple Cardiovascular: Tachycardia Respiratory/Chest: CTA Bilaterally without wheezes or rales. Normal effort    GI/Abdomen: BS +, non-tender, non-distended,soft Ext: Right upper extremity swelling from IV infiltration Psych: flat but cooperates Musculoskeletal:     Cervical back: Neck supple. Protracted posture    Comments: nods head, follows simple commands. Remains non-verbal. L grip/biceps 5-/5 BUE: 3/5 grip/biceps.  No appreciable RLE and LLE movement. Skin:    General: Skin is warm and dry.  Neurological:       Alert,  Remains non-verbal. L grip/biceps 5-/5, did not move right side for moe. Delayed processing Stand pivot transfer MinA Needs set-up for meals Functional mobility: holds Ensure and drinks herself with straw Psychiatric:     Comments: Smiling,  +echolalia    Assessment/Plan: 1. Functional deficits which require 3+ hours per day of interdisciplinary therapy in a comprehensive  inpatient rehab setting. Physiatrist is providing close team supervision and 24 hour management of active medical problems listed below. Physiatrist and rehab team continue to assess barriers to discharge/monitor patient progress toward functional and medical goals  Care Tool:  Bathing    Body parts bathed by patient: Right arm, Left arm, Chest, Face, Abdomen, Right upper leg, Left upper leg   Body parts bathed by helper: Front perineal area, Buttocks, Right lower leg, Left lower leg Body parts n/a: Front perineal area, Buttocks   Bathing assist Assist Level: Maximal Assistance - Patient 24 - 49%     Upper Body Dressing/Undressing Upper body dressing   What is the patient wearing?: Bra, Pull over shirt    Upper body assist Assist Level: Maximal Assistance - Patient 25 - 49%    Lower Body Dressing/Undressing Lower body dressing      What is the patient wearing?: Incontinence brief, Pants     Lower body assist Assist for lower body dressing: Maximal Assistance - Patient 25 - 49%     Toileting Toileting    Toileting assist Assist for toileting: Moderate Assistance - Patient 50 - 74%     Transfers Chair/bed transfer  Transfers  assist     Chair/bed transfer assist level: Maximal Assistance - Patient 25 - 49%     Locomotion Ambulation   Ambulation assist   Ambulation activity did not occur: Safety/medical concerns  Assist level: Minimal Assistance - Patient > 75% Assistive device: Walker-rolling Max distance: 56ft   Walk 10 feet activity   Assist  Walk 10 feet activity did not occur: Safety/medical concerns  Assist level: Minimal Assistance - Patient > 75% Assistive device: Walker-rolling   Walk 50 feet activity   Assist Walk 50 feet with 2 turns activity did not occur: Safety/medical concerns         Walk 150 feet activity   Assist Walk 150 feet activity did not occur: Safety/medical concerns         Walk 10 feet on uneven surface   activity   Assist Walk 10 feet on uneven surfaces activity did not occur: Safety/medical concerns         Wheelchair     Assist Is the patient using a wheelchair?: No   Wheelchair activity did not occur: Safety/medical concerns         Wheelchair 50 feet with 2 turns activity    Assist    Wheelchair 50 feet with 2 turns activity did not occur: Safety/medical concerns       Wheelchair 150 feet activity     Assist  Wheelchair 150 feet activity did not occur: Safety/medical concerns       Blood pressure (!) 148/74, pulse 90, temperature 98.3 F (36.8 C), resp. rate 18, height 5\' 5"  (1.651 m), weight 60.8 kg, SpO2 100 %.    Medical Problem List and Plan: 1. 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             -patient may  shower             -ELOS/Goals: expected dc 6/7 min A to mod A and Max a for auditory comprehension and Total A for verbal comprehension  Continue CIR therapies including PT, OT, and SLP   Family updated on Saturday, hospice liaison consulted for inpatient hospice  -Family considering SNF 2.  Anemia: continue Heparin [place] TID. Transfused 1U on 5/15. Post-transfusion hemoglobin trending upward nicely              -antiplatelet therapy: aspirin 81 mg daily  -Anemia Stable at HGB 8.3, continue to follow weekly at SNF 3. Pain Management: NA, d/c tylenol 4. Mood: LCSW to evaluate and provide emotional support             -antipsychotic agents: n/a 5. Neuropsych: This patient is not capable of making decisions on her own behalf. 6. Skin/Wound Care: Routine skin care checks 7. Dysphagia             -- continue Dysphagia 1 diet with thin liquids.  Crushed meds with applesauce.  SLP evaluation. MBS discussed with SLP and shows high risk of aspiration with thin liquids. Discussed with daughter and SLP thickened smoothies at home for good nutrition             -- Continue nutritional  supplements, B12, MVI   8: Paroxysmal atrial fibrillation: continue Lopressor 50 mg BID --home torsemide on hold --no AC due to small bowel AVMs Add magnesium gluconate 250mg  HS    01/28/2022    7:39 PM 01/28/2022    4:04 AM  Vitals with BMI  Systolic 712 458  Diastolic 74 90  Pulse 90 99    9: Diabetes mellitus: CBGs qAC,qHS; monitor intake and activity --continue SSI --increase glipizide back to 10mg  (home dose)- changed timing to after supper due to AM hypoglycemia and elevated CBGs HS Increase Semglee to 15U started HS D/c Ensure between meals D/c HS sliding scale due to AM hypoglycemia.  5/28- Cbgs very labile- 101 this AM, but 200s-300s otherwise- will increase Semglee to 14 (from 12) units and monitor 5/29- cbgs still labile--semglee just increased--observe today  -may need am dose of semglee 10: Hypertension: d/c Lisinopril due to worsening kidney function, continue amlodipine 10 mg, Lopressor. Repeat creatinine on Monday given increase in Lisinopril.  5/13 BP controlled today. 5/14 some variability in B/P, with mild elevation, some episodes of bradycardia (HR 46 at 051 today). -D/c lisinopril today since BUN 28->68 and Crt 2.28->3.20, with increase in past 2 days.  Hold lopressor with bradycardia episodes.  Will see how B/P trends on amlodipine 10 mg, may consider adding hydralazine if B/p is elevated.   -now hypotensive, change fluids to NS HS 12 hours -5/22 BP a little stable currently, will continue to monitor 5/27- BP slightly elevated in 150s, however off ACE inhibitor- likely up since beocming more euvolemic. Will monitor for trend.  5/29 bp under fair to reasonable control 6/5 BP well controlled    01/28/2022    7:39 PM 01/28/2022    4:04 AM  Vitals with BMI  Systolic 532 992  Diastolic 74 90  Pulse 90 99   11: Hyperlipidemia: continue rosuvastatin 10 mg 12: Moderate malnutrition: continue supplements 13: Chronic kidney disease stage 3b: follow-up BMP     -5/13  B/P is in the 120's today. Pt had lisinopril increased to 40 mg yesterday for elevated B/P.  Since lisinopril may increase Crt, decrease lisinopril to 20 mg daily today and push po intake and repeat BMP. -5/14 Crt up to 3.2 and BUN up to 68.  Fluid bolus (1L NS) and continuous fluid (0.45 NS at 75 ml/hr) given for rise in BUN and Crt. -d/c lisinopril (per Dr. Christella Noa) since could be a possible cause of elevated Crt . Cr elevated, nephrology consulted. Urine studies obtained, creatinine improving, continue D5W 5/27- Cr down to 2.75 from 3.01- Renal on board- BUN 47 5/29 bun improved to 48, Cr still 2.75--renal has signed off.  6/5 Cr elevated to 3.35 today, discussed with nephrology, will change fluids to D5 1/2 normal saline at 75 ml/hr    Latest Ref Rng & Units 01/29/2022    5:22 AM 01/28/2022    6:27 AM 01/27/2022    2:49 PM  BMP  Glucose 70 - 99 mg/dL 164   169   234    BUN 8 - 23 mg/dL 57   52   58    Creatinine 0.44 - 1.00 mg/dL 3.08   3.35   3.32    Sodium 135 - 145 mmol/L 140   136   138    Potassium 3.5 - 5.1 mmol/L 4.6   4.7   5.7    Chloride 98 - 111 mmol/L 110   108   108    CO2 22 - 32 mmol/L 23   22   20     Calcium 8.9 - 10.3 mg/dL 8.1   8.1   8.3      14: Glaucoma: continue latanoprost drops 15: Hypothyroidism: continue Synthroid 16: Diarrhea/loose stools: monitor; getting loperamide 2 mg daily (BID prn) -continue current regimen. 17: Bowel and bladder incontinence:  start bowel and bladder program 18. Daytime somnolence: [employer] and SW if therapy can be scheduled a little later in the day and if daughters can be present to help motivate her  5/27- still sleepy- might be worth looking into Ritalin? 19. Bradycardia: discontinue lopressor. 5/27- HR in 40s- just for last 12 hours- will recheck with nursing- if still less than 60, will order EKG  20. History of GI bleed: start protonix.  21. Hypotension: resolved, hemoglobin reviewed and stable 22. Neurogenic bladder:  bladder program ordered starting 5/11. 23. Stool incontinence: fiber added to bulk stool 24. Screening for vitamin D deficiency: Vitamin D level 50: start daily supplement 25. Fatigue: Iron reviewed and is 68, HGB slightly improved to 9.9. Continue vitamin D 1,000U daily 26. Elevated chloride: elevating again, increase D5 to 37mls/hour, repeat chloride today 27. Hypernatremia: resolved, Na 140, decrease D5 1/2 normal saline at to 50 ml/hr, repeat labs tomorrow.  28. Tachycardia: Toprol XL ordered. Increase to 25mg .  29. Hyperkalemia  -6/5 K+ 4.7 today, she had insulin          LOS: 26 days A FACE TO FACE EVALUATION WAS PERFORMED  Martha Clan P Janari Yamada 01/29/2022, 10:31 AM

## 2022-01-29 NOTE — Progress Notes (Signed)
Physical Therapy Discharge Summary  Patient Details  Name: Patricia Dudley MRN: [id number] Date of Birth: 1929/11/21  Patient has met 4 of 9 long term goals due to improved activity tolerance, improved balance, and increased strength. Patient to discharge at a wheelchair level  anywhere from min A of 1 to mod A of 2 with RW due to impaired motor planning/sequencing. Patient's care partner unavailable to provide the necessary physical and cognitive assistance at discharge. Pt's family initially requesting to take pt home, however after numerous hands on family education sessions pt's family realized how much care pt requires and decided to pursue SNF.   Reasons goals not met: Pt continues to fluctuate heavily with mobility status. Pt did not meet goals due to poor motor planning/sequencing, decreased attention/initiation, R hemiparesis, aphasia impacting communication, generalized weakness/deconditioning, and decreased balance strategies.   Recommendation:  Patient will benefit from ongoing skilled PT services in skilled nursing facility setting to continue to advance safe functional mobility, address ongoing impairments in transfers, generalized strengthening and endurance, dynamic standing balance/coordination, NMR, gait training, and to minimize fall risk.  Equipment: No equipment provided due to D/C to SNF  Reasons for discharge: lack of progress toward goals, treatment goals met, and discharge from hospital  Patient/family agrees with progress made and goals achieved: Yes  PT Discharge Precautions/Restrictions Precautions Precautions: Fall Precaution Comments: aspiration precautions, aphasia Restrictions Weight Bearing Restrictions: No Pain Interference Pain Interference Pain Effect on Sleep: 8. Unable to answer (global aphasia) Pain Interference with Therapy Activities: 8. Unable to answer (global aphasia) Pain Interference with Day-to-Day Activities: 8. Unable to answer (global  aphasia) Cognition Overall Cognitive Status: Difficult to assess Arousal/Alertness: Lethargic Orientation Level: Oriented to person Attention: Focused Focused Attention: Impaired Memory: Impaired Awareness: Impaired Problem Solving: Impaired Safety/Judgment: Impaired Comments: Pt remains mostly non-verbal; fluctuates with mobility as well as speaking/communication abilities Sensation Sensation Light Touch: Appears Intact Proprioception: Impaired by gross assessment Additional Comments: sensation not formally tested due to global aphasia and poor motor planning/sequencing. However, pt alerts to most tactile stimuli. Coordination Gross Motor Movements are Fluid and Coordinated: No Fine Motor Movements are Fluid and Coordinated: No Coordination and Movement Description: grossly uncoordinated due to generalized weakness/deconditioning, R hemiparesis, impaired motor planning/sequencing, poor initation/attention and ability to follow commands, and fluctuations in mobility Heel Shin Test: not tested due to impaired motor planning/sequencing Motor  Motor Motor: Hemiplegia;Other (comment) Motor - Skilled Clinical Observations: R hemiparesis, impaired motor planning/sequencing, poor initation/attention and ability to follow commands.  Mobility Bed Mobility Bed Mobility: Rolling Right;Rolling Left;Sit to Supine;Supine to Sit Rolling Right: Moderate Assistance - Patient 50-74% Rolling Left: Moderate Assistance - Patient 50-74% Supine to Sit: Maximal Assistance - Patient - Patient 25-49% Sit to Supine: Maximal Assistance - Patient 25-49% Transfers Transfers: Sit to Stand;Stand to Sit;Stand Pivot Transfers Sit to Stand: Minimal Assistance - Patient > 75% (but can fluctuate and require max A of 1 up to mod A of 2) Stand to Sit: Contact Guard/Touching assist Stand Pivot Transfers: Minimal Assistance - Patient > 75% (but can fluctuate and require max A of 1 up to mod A of 2) Stand Pivot Transfer  Details: Verbal cues for sequencing;Verbal cues for technique;Verbal cues for precautions/safety;Verbal cues for safe use of DME/AE;Manual facilitation for weight shifting;Manual facilitation for placement Stand Pivot Transfer Details (indicate cue type and reason): pt requires max cues for sequencing/motor planning and manual facilation to steer/control RW Transfer (Assistive device): Rolling walker Locomotion  Gait Ambulation: Yes Gait Assistance: Minimal Assistance -  Patient > 75% Gait Distance (Feet): 16 Feet Assistive device: Rolling walker Gait Assistance Details: Verbal cues for gait pattern;Verbal cues for safe use of DME/AE;Tactile cues for sequencing;Verbal cues for sequencing;Tactile cues for weight shifting;Verbal cues for technique;Verbal cues for precautions/safety;Manual facilitation for weight shifting;Manual facilitation for placement Gait Assistance Details: manual faciliation to steer/control RW, occasional assist to advance RLE, max verbal and tactile cues for technique/sequencing Gait Gait: Yes Gait Pattern: Impaired Gait Pattern: Step-to pattern;Decreased step length - right;Decreased step length - left;Decreased stride length;Shuffle;Poor foot clearance - left;Poor foot clearance - right;Narrow base of support;Trunk flexed Gait velocity: significantly decreased Stairs / Additional Locomotion Stairs: Yes Stairs Assistance: 2 Helpers Stair Management Technique: Two rails Number of Stairs: 4 Height of Stairs: 6 Pick up small object from the floor (from standing position) activity did not occur: Safety/medical concerns (fatigue, weakness, poor motor planning/sequencing) Wheelchair Mobility Wheelchair Mobility: Yes Wheelchair Assistance: Dependent - Patient 0% Wheelchair Parts Management: Needs assistance Distance: 175f  Trunk/Postural Assessment  Cervical Assessment Cervical Assessment: Exceptions to [facility](cervical kyphosis) Thoracic Assessment Thoracic Assessment:  Exceptions to [facility](kyphotic) Lumbar Assessment Lumbar Assessment: Exceptions to [facility](sacral sitting) Postural Control Postural Control: Deficits on evaluation Head Control: inadequate Trunk Control: posterior lean in sitting when first waking up Righting Reactions: delayed Protective Responses: significantly delayed/sometimes absent  Balance Balance Balance Assessed: Yes Static Sitting Balance Static Sitting - Balance Support: Bilateral upper extremity supported;Feet supported Static Sitting - Level of Assistance: 5: Stand by assistance (supervision) Dynamic Sitting Balance Dynamic Sitting - Balance Support: Feet supported;No upper extremity supported Dynamic Sitting - Level of Assistance: 5: Stand by assistance (CGA) Static Standing Balance Static Standing - Balance Support: Bilateral upper extremity supported;During functional activity (RW) Static Standing - Level of Assistance: 5: Stand by assistance (CGA but can fluctuate and require up to mod/max A with impaired motor planning) Dynamic Standing Balance Dynamic Standing - Balance Support: Bilateral upper extremity supported;During functional activity (RW) Dynamic Standing - Level of Assistance: 4: Min assist Dynamic Standing - Comments: Pt can require as little as min A for transfers and gait, but fluctuates and can require up to mod/max A of 2 Extremity Assessment  RLE Assessment RLE Assessment: Not tested General Strength Comments: not formally tested due to aphasia and impaired motor planning/sequecing. Grossly 3-/5 LLE Assessment LLE Assessment: Not tested General Strength Comments: not formally tested due to aphasia and impaired motor planning/sequecing. Grossly 3+/5  AFreeman Spur DPT  01/29/2022, 7:14 AM

## 2022-01-29 NOTE — Progress Notes (Signed)
Left lower extremity venous duplex completed. Refer to "CV Proc" under chart review to view preliminary results.  01/29/2022 5:16 PM Kelby Aline., MHA, RVT, RDCS, RDMS

## 2022-01-29 NOTE — Progress Notes (Signed)
Patient ID: Patricia Dudley, female   DOB: 27-May-1930, 88 y.o.   MRN: [id number]  Sw received follow up from patient daughter, [name]. The family has decided to proceed with placement at [facility]. AD and SW planning for discharge on Thursday. No additional questions or concerns, SW will continue to follow up.

## 2022-01-29 NOTE — Progress Notes (Signed)
Left lower extremity venous duplex completed. Refer to "CV Proc" under chart review to view preliminary results.  01/29/2022 5:10 PM Kelby Aline., MHA, RVT, RDCS, RDMS

## 2022-01-29 NOTE — Progress Notes (Signed)
Occupational Therapy Session Note  Patient Details  Name: Patricia Dudley MRN: [id number] Date of Birth: Jan 13, 1930  Today's Date: 01/29/2022 OT Individual Time: 1000-1100 OT Individual Time Calculation (min): 60 min    Short Term Goals: Week 4:  OT Short Term Goal 1 (Week 4): STG + LTG 2/2 ELOS  Skilled Therapeutic Interventions/Progress Updates:  Skilled OT intervention completed with focus on motor planning, initiation and activity tolerance within self-care tasks. Pt received seated in TIS chair, alert, smiling with NAD noted. Offered to take pt to the bathroom, however pt politely shook head "no" when asked and "yes" to bathing. Nurse in room to disconnect pt from IV, with nurse and care team notified of therapist locating a warm to touch, protruding lump on pt's leg in suspicion of DVT.   Seated at sink, pt completed UB bathing with set up A, with only verbal cue given for one step command, and pt initiating and completing all areas without physical assist. Attempted figure 4 position for donning of LB, however pt shaking head "yes" to needing help with therapist completing threading over feet and pt donning up to thighs with no commands. Sit > stand with use of sink and min A, with BM noted in brief upon stance. Pt maintained standing for duration of brief removal, pericare at total A and threading of new brief, with only 2 attempts to sit during toileting demonstrated however pt responsive to cues to maintain stance. Seated rest break then sit > stand with CGA and pt using both hands after hand over hand assist to hips to pull both sides of pants up with CGA for balance only. Doffed/donned shirt with min A after therapist initiated the tasks. With hand over hand assist for initiating the task, pt able to remove 4/6 hair curlers while intermittently attending to her reflection in the mirror with auditory stimuli applied onto mirror).  Pt was tearful at end of session, demonstrating first  instance of emotional expression, as therapist was saying farewells and well wishes. Therapist provided emotional support for pt, explaining her discharge process and pt was able to initiate leaning forward for a hug from therapist. Pt was left seated in TIS chair, with belt alarm on and all needs in reach at end of session.  Therapy Documentation Precautions:  Precautions Precautions: Fall Precaution Comments: aspiration precautions, aphasia Restrictions Weight Bearing Restrictions: No    Therapy/Group: Individual Therapy  Blase Mess, MS, OTR/L  01/29/2022, 7:43 AM

## 2022-01-29 NOTE — Progress Notes (Signed)
[facility]) Hospital Liaison Note  Reevaluated patient at bedside per PMT request.   Hospice eligibility still pending.   [place] liaison will reevaluate again tomorrow.   Please call with any questions or concerns. Thank you  Roselee Nova, [place] [facility] Liaison [phone]

## 2022-01-29 NOTE — Progress Notes (Signed)
Physical Therapy Session Note  Patient Details  Name: Patricia Dudley MRN: [id number] Date of Birth: Jun 18, 1930  Today's Date: 01/29/2022 PT Individual Time: 7793-9030 and 1400-1443 PT Individual Time Calculation (min): 39 min and 43 min PT Missed Time: 17 minutes PT Missed Time Reason: Fatigue  Short Term Goals: Week 3:  PT Short Term Goal 1 (Week 3): pt will initate supine<>sit with mod A PT Short Term Goal 1 - Progress (Week 3): Progressing toward goal PT Short Term Goal 2 (Week 3): pt will transfer sit<>stand with mod A consistantly PT Short Term Goal 2 - Progress (Week 3): Progressing toward goal PT Short Term Goal 3 (Week 3): pt will ambulate 51f with LRAD and mod A PT Short Term Goal 3 - Progress (Week 3): Met Week 4:  PT Short Term Goal 1 (Week 4): STG=LTG due to LOS  Skilled Therapeutic Interventions/Progress Updates:  Treatment Session 1  [facility] present at [facility]. Pt much more alert today and able to communicate via nodding yes/no and said "bye" when therapist left at end of session. RN arrived and requested pt return to bed at end of session for IV team to come. Session with emphasis on functional mobility/transfers, generalized strengthening and endurance, dynamic standing balance/coordination, and gait training. Pt [facility] for time management purposes. Sit<>stand with RW and mod A provided by pt's daughter and instructed pt to begin walking forward. However, due to poor motor planning/sequencing pt began shuffling in a circle and very distracted by others in gym. Pt also with poor safety awareness, pushing RW way too far forward. Attempted to educate daughter on how to assist with advancing RLE, however technique was too difficult for daughter. Returned to room and stood with RW and heavy min A with therapist and ambulated 116fwith RW and min A to bed. Pt required occasional assist to advance RLE and  mod A to steer RW. Upon turning to get to bed, pt began sitting and with poor motor planning/sequencing, requiring daughter to quickly bring WC up for pt to sit. Pt then transferred TIS WC<>bed stand<>pivot with RW and min A to stand but mod/max A for transfer as pt not advancing RLE resulting in posterior and R lateral LOB onto bed. Pt transferred sit<>supine with max A and scooted to [facility] +2 assist. Concluded session with pt semi-reclined in bed, needs within reach, and bed alarm on. Daughter present at bedside.   Treatment Session 2 Received pt semi-reclined in bed with RN present at bedside. IV team just finished replacing IV and ultrasound scheduled for 3pm - RN requesting pt return to bed at end of session. Session with emphasis on functional mobility/transfers, toileting, dressing, generalized strengthening and endurance, and dynamic standing balance/coordination. Pt transferred semi-reclined<>sitting EOB with HOB elevated and mod A! Pt reached out for therapist's hand to assist in pulling upright to sit EOB when therapist allowed increased time and provided cues. Pt stated "no" when asked if she needed to toilet, however noticed wet spot on bedsheets. Pt transferred bed<>bedside commode with RW and heavy min A and required +2 assist for clothing management as pt attempting to sit before therapist could get clothes down. Pt found to be incontinent of bowel and with continued BM once on commode. Removed dirty brief and pants and donned clean ones sitting with max A. Pt required multiple stands from commode to complete peri-care (dependent) with daughter providing +2 assist and RN  applied new sacral dressing to buttocks. Upon transferring back to bed, pt began sitting before turning completley around to bed, requiring max A from therapist to quickly/safely pivot hips onto bed. Worked on dynamic sitting balance, attention, and functional use of RUE folding washcloths. Pt initially able to maintain static  sitting balance with close supervision but with increased fatigue demonstrated multiple posterior LOB requiring mod A to correct. Pt appeared to be initiating lying back down and nodded when asked if she was tired. Transferred sit<>supine with max A and required +2 assist to scoot to [facility]. Concluded session with pt semi-reclined in bed, needs within reach, and bed alarm on awaiting upcoming ultrasound. 17 minutes missed of skilled physical therapy due to fatigue.   Therapy Documentation Precautions:  Precautions Precautions: Fall Precaution Comments: aspiration precautions, aphasia Restrictions Weight Bearing Restrictions: No  Therapy/Group: Individual Therapy Alfonse Alpers PT, DPT  01/29/2022, 7:18 AM

## 2022-01-30 LAB — BASIC METABOLIC PANEL
Anion gap: 7 (ref 5–15)
BUN: 52 mg/dL — ABNORMAL HIGH (ref 8–23)
CO2: 21 mmol/L — ABNORMAL LOW (ref 22–32)
Calcium: 8.2 mg/dL — ABNORMAL LOW (ref 8.9–10.3)
Chloride: 112 mmol/L — ABNORMAL HIGH (ref 98–111)
Creatinine, Ser: 3.05 mg/dL — ABNORMAL HIGH (ref 0.44–1.00)
GFR, Estimated: 14 mL/min — ABNORMAL LOW (ref >60)
Glucose, Bld: 97 mg/dL (ref 70–99)
Potassium: 4.6 mmol/L (ref 3.5–5.1)
Sodium: 140 mmol/L (ref 135–145)

## 2022-01-30 LAB — GLUCOSE, CAPILLARY
Glucose-Capillary: 100 mg/dL — ABNORMAL HIGH (ref 70–99)
Glucose-Capillary: 69 mg/dL — ABNORMAL LOW (ref 70–99)
Glucose-Capillary: 274 mg/dL — ABNORMAL HIGH (ref 70–99)
Glucose-Capillary: 299 mg/dL — ABNORMAL HIGH (ref 70–99)
Glucose-Capillary: 256 mg/dL — ABNORMAL HIGH (ref 70–99)

## 2022-01-30 MED ORDER — INSULIN ASPART 100 UNIT/ML IJ SOLN
0.0000 [IU] | Freq: Three times a day (TID) | INTRAMUSCULAR | Status: DC
  Administered 2022-01-30 (×2): 3 [IU] via SUBCUTANEOUS

## 2022-01-30 MED ORDER — PANTOPRAZOLE 2 MG/ML SUSPENSION
40.0000 mg | Freq: Every day | ORAL | Status: DC
  Administered 2022-01-30 – 2022-01-31 (×2): 40 mg
  Filled 2022-01-30 (×2): qty 20

## 2022-01-30 MED ORDER — METOPROLOL TARTRATE 12.5 MG HALF TABLET
12.5000 mg | ORAL_TABLET | Freq: Two times a day (BID) | ORAL | Status: DC
  Administered 2022-01-30 – 2022-01-31 (×3): 12.5 mg via ORAL
  Filled 2022-01-30 (×3): qty 1

## 2022-01-30 NOTE — Patient Care Conference (Signed)
Inpatient RehabilitationTeam Conference and Plan of Care Update Date: 01/30/2022   Time: 11:46 AM    Patient Name: Patricia Dudley      Medical Record Number: [id number]  Date of Birth: Dec 22, 1929 Sex: Female         Room/Bed: 4W07C/4W07C-01 Payor Info: Payor: [employer] MEDICARE / Plan: [employer] MEDICARE CHOICE PPO / Product Type: *No Product type* /    Admit Date/Time:  01/03/2022  2:52 PM  Primary Diagnosis:  Acute embolic stroke [facility])  Hospital Problems: Principal Problem:   Acute embolic stroke [facility]) Active Problems:   Chronic kidney disease   Hypernatremia   Hyperkalemia    Expected Discharge Date: Expected Discharge Date: 01/31/22 (SNF)  Team Members Present: Physician leading conference: Dr. Leeroy Cha Social Worker Present: Erlene Quan, BSW Nurse Present: Dorien Chihuahua, RN PT Present: Becky Sax, PT OT Present: Jennefer Bravo, OT SLP Present: Helaine Chess, SLP PPS Coordinator present : Ileana Ladd, PT     Current Status/Progress Goal Weekly Team Focus  Bowel/Bladder     Incontinent of bowel and bladder   Incontinence managed   Toileting protocol  Swallow/Nutrition/ Hydration   Dys 1 and NTL; Mod-Max A  Mod A for consuming least restrictive diet  N/A   ADL's   min to max A for bathing/dressing, mod to max A toileting, mod A feeding, total A oral care  keeping goals at mod A even though can do some at min A 2/2 fluctuating assist  d/c as planned 6/8   Mobility   bed mobility max A, transfers with RW min A ranging to max A (occasional +2 assist), and ambulating up to 14ft with RW and min A  min A  functional mobility/transfers, generalized strengthening and endurance, NMR, dynamic standing balance/coordination, gait training, family educatoin, and attention/initiation   Communication   Max A multimodal communication for auditory comprehension and verbal expression  Max A for auditory comprehension, Total A for verbal expression  N/A    Safety/Cognition/ Behavioral Observations  Mod-Max A - fluctuates greatly  Mod A for focused attention for 5 minutes  N/A   Pain     N/A        Skin     N/A          Discharge Planning:  Discharging to SNF on Thursday, family made determination to privately pay for SNF services   Team Discussion: Patient with poor prognosis per MD; requires continuous IVF. Functional status fluctuates and she has plateaued.  Patient on target to meet rehab goals: no, currently min - max assist overall.   *See Care Plan and progress notes for long and short-term goals.   Revisions to Treatment Plan:  Hospice referral  SNF recommended   Teaching Needs: Safety, skin care, medications, feeding, toileting, transfers, etc  Current Barriers to Discharge: Decreased caregiver support, Home enviroment access/layout, and Incontinence  Possible Resolutions to Barriers: SNF recommended     Medical Summary Current Status: hyperchloremia, Na normal, fluctuating attemtiveness, hypoglycemic, swelling in right arm from IV infiltration, HTN  Barriers to Discharge: Medical stability;Behavior  Barriers to Discharge Comments: hyperchloremia, Na normal, fluctuating attemtiveness, hypoglycemic, swelling in right arm from IV infiltration, HTN Possible Resolutions to [employer]: continue D5 indefinitely, decrease Novolog sliding scale, continue to monitor skin for swelling, continue amlodipine, conitnue palliatic care follow-up   Continued Need for Acute Rehabilitation Level of Care: The patient requires daily medical management by a physician with specialized training in physical medicine and rehabilitation for the following  reasons: Direction of a multidisciplinary physical rehabilitation program to maximize functional independence : Yes Medical management of patient stability for increased activity during participation in an intensive rehabilitation regime.: Yes Analysis of laboratory values and/or  radiology reports with any subsequent need for medication adjustment and/or medical intervention. : Yes   I attest that I was present, lead the team conference, and concur with the assessment and plan of the team.   Dorien Chihuahua B 01/30/2022, 2:49 PM

## 2022-01-30 NOTE — Progress Notes (Signed)
Nutrition Follow-up  DOCUMENTATION CODES:   Non-severe (moderate) malnutrition in context of chronic illness  INTERVENTION:   Continue Multivitamin w/ minerals daily Continue Vital Cuisine Shake BID, each supplement provides 520 kcal and 22 grams of protein  NUTRITION DIAGNOSIS:   Moderate Malnutrition related to chronic illness as evidenced by moderate fat depletion, percent weight loss, mild muscle depletion. - Ongoing  GOAL:   Patient will meet greater than or equal to 90% of their needs - Progressing   MONITOR:   PO intake, Supplement acceptance, Labs, Weight trends  REASON FOR ASSESSMENT:   Consult Calorie Count  ASSESSMENT:   88 y.o. female admitted to CIR after an embolic stroke. PMH includes GERD, T2DM, TIA, CVA, HTN, and gastritis.  5/26 - MBS w/ SLP, recommend Dysphagia 1 - Nectar Thick Liquids   Plan is for pt to discharge to SNF tomorrow.   Pt sitting in wheelchair, daughter at bedside. Daughter reports that she had just got there and was not sure how she had been eating.  Per EMR, pt PO intake includes: 6/4: Dinner 0% 6/5 Lunch 30%, Dinner 100% 6/6: Breakfast 50%, Lunch 75%, Dinner 50% 6/7: Breakfast 40%  RD visit cut short due to [facility] liaison there to evaluate pt.  Medications reviewed and include: Vitamin D3,Glucotrol, SSI, Semglee, Magnesium Gluconate, MVI, Protonix, Fibercon, Vitamin B12 Labs reviewed: BUN 52, Creatinine 3.05, 24 CBG 69-302  Diet Order:   Diet Order             DIET - DYS 1 Room service appropriate? Yes; Fluid consistency: Nectar Thick  Diet effective now                  EDUCATION NEEDS:   Not appropriate for education at this time  Skin:  Skin Assessment: Reviewed RN Assessment  Last BM:  6/6  Height:  Ht Readings from Last 1 Encounters:  01/03/22 5\' 5"  (1.651 m)   Weight:  Wt Readings from Last 1 Encounters:  01/24/22 60.8 kg   Ideal Body Weight:  56.8 kg  BMI:  Body mass index is 22.31  kg/m.  Estimated Nutritional Needs:  Kcal:  1600-1800 Protein:  80-95 grams Fluid:  >/= 1.6 L    Hermina Barters RD, LDN Clinical Dietitian See [facility] for contact information.

## 2022-01-30 NOTE — Progress Notes (Signed)
Physical Therapy Session Note  Patient Details  Name: Patricia Dudley MRN: [id number] Date of Birth: [birth year]  Today's Date: 01/30/2022 PT Individual Time: [phone] and [phone] PT Individual Time Calculation (min): 56 min and 68 min  Short Term Goals: Week 3:  PT Short Term Goal 1 (Week 3): pt will initate supine<>sit with mod A PT Short Term Goal 1 - Progress (Week 3): Progressing toward goal PT Short Term Goal 2 (Week 3): pt will transfer sit<>stand with mod A consistantly PT Short Term Goal 2 - Progress (Week 3): Progressing toward goal PT Short Term Goal 3 (Week 3): pt will ambulate 26f with LRAD and mod A PT Short Term Goal 3 - Progress (Week 3): Met Week 4:  PT Short Term Goal 1 (Week 4): STG=LTG due to LOS  Skilled Therapeutic Interventions/Progress Updates:   Treatment Session 1 Received pt semi-reclined in bed asleep - NT arrived with breakfast tray and pt awoke when therapist let down bed rail and moved blankets, but not to verbal stimuli. Session with emphasis on functional mobility/transfers, toileting, dressing, generalized strengthening and endurance, and feeding. Pt transferred semi-reclined<>sitting EOB with HOB elevated and max A and scooted to EOB with max A with little/no initiation of movement. Pt transferred sit<>stand with RW and mod A and stand<>pivot bed<>bedside commode with RW and max A - as pt attempting to sit mid-transfer. Removed brief with total A and found brief clean - pt able to void and with small BM when allowed time (RN notified). Donned new brief and pants seated with max A, then pt began initiating standing from commode - completed stand with min A and performed peri-care dependently. Pt required max A to pull brief/pants over hips and max A for stand<>pivot transfer back to bed - again due to impaired motor planning attempting to sit prior to reaching bed. Pt then transferred bed<>TIS WC stand<>pivot with max A in same manner and RN present to  disconnect IV to don clean shirt. Doffed nightgown with max A and clean shirt with max A, however pt initiating getting head and arms through holes. Donned slippers and set pt up to eat breakfast. Began with liquids and pt drank orange juice and a few sips of milk and chocolate shake and ate a few bites of oatmeal and sausage. Pt was able to initiate scooping food onto spoon and once therapist brought elbow halfway to mouth, pt able to bring spoon to mouth to feed, using RUE. Concluded session with pt sitting in TIS WC, needs within reach, and seatbelt alarm on. Handed off feeding to NT due to time restrictions.   Treatment Session 2 Received pt sitting in TIS WC asleep. Session with emphasis on functional mobility/transfers, generalized strengthening and endurance, dynamic standing balance/coordination, and NMR. Pt transported to/from room in TIS WC dependently. Worked on dynamic standing balance, attention, initiation, motor planning, and following commands attempting to toss horseshoes. Stood x 4 reps with RW and min A fading to mod A with BUE support on RW and required hand over hand assist to remove hand from RW and grab horseshoe. Of note, pt still attempting to sit down immediately once standing, requiring tactile cues at pelvis and anterior shoulder to keep upright. Pt able to actively reach out and grip 1/6 horseshoes with RUE but unable to motor plan dropping them on target without hand over hand guidance, but did hook one back on RW. Then worked on attention, initiation, motor planning, functional use of RUE, and following commands  picking up poker chips with RUE and placing them into cup x 4 reps. Pt unable to consistently follow one step commands without hand over hand guidance and required significantly increased amount of time with task. Concluded session with pt sitting in TIS WC, needs within reach, and seatbelt alarm on.   Therapy Documentation Precautions:  Precautions Precautions:  Fall Precaution Comments: aspiration precautions, aphasia Restrictions Weight Bearing Restrictions: No  Therapy/Group: Individual Therapy Alfonse Alpers PT, DPT  01/30/2022, 6:51 AM

## 2022-01-30 NOTE — Progress Notes (Addendum)
Speech Language Pathology Discharge Summary  Patient Details  Name: MIGUELINA FORE MRN: [id number] Date of Birth: Nov 08, 1929  Today's Date: 01/30/2022  Patient has met 4 of 4 long term goals.  Patient to discharge at overall Max level.  Reasons goals not met:     Clinical Impression/Discharge Summary: During CIR admission, pt has demonstrated minimal to no functional gains in regards to deglutition, communication, and cognitive abilities. 2 instrumental swallow studies were completed during her admission (5/17 and 5/26 - MBSS), which revealed no change in swallow physiology, resulting in silent aspiration of thin liquids with poor oral awareness and manipulation of solid textures. Therefore, continue to recommend Dysphagia 1 (puree) and nectar-thick liquids (straw OK) given Max to Total A for feeding and implementation of aspiration precautions (small bites/sips, slow rate, upright positioning, use of dry spoon and pressure on center of tongue with spoon to encourage swallow response if oral holding is noted). Benefits from medication crushed in honey-thick liquid via tsp given poor acceptance of crushed medications in applesauce. Pt does not appear to be a good candidate for long-term, alternate means of nutrition and hydration in light of current deficits and multiple co-morbidities.   Re: communication, pt is essentially non-verbal, though will verbalize rote/automatic words at times. Will seldomly answer "yes" and "no" questions via head gestures. Intermittently follows one-step commands within functional context with Min A, while other times pt requires HOH/Total A. Will rarely select appropriate items needed to complete ADL tasks when objects are presented in a field of 2. Overall, pt's performance and participate fluctuates from day to day; nevertheless, no functional change has been noted during CIR admission.   Re: cognition, recommend orienting pt daily to date, time, and place and  providing extended processing time, model prompts for following commands, and verbal repetition. Pt also benefits from elevated vocal intensity from communication partner given pt is HOH. ST may follow-up at SNF for ongoing diet tolerance monitoring, if this aligns with family wishes/desires.  Care Partner:  Caregiver Able to Provide Assistance: Other (comment) (pt discharging to SNF)   Recommendation:  Skilled Nursing facility  Rationale for SLP Follow Up: Maximize swallowing safety   Equipment: Suctioning set-up at SNF  Reasons for discharge: Discharged from hospital   Patient/Family Agrees with Progress Made and Goals Achieved: Yes    Mykell Genao A Brennin Durfee 01/30/2022, 10:17 PM

## 2022-01-30 NOTE — Progress Notes (Signed)
Hypoglycemic Event  CBG: 69  Treatment: 8 oz juice/soda  Symptoms: None  Follow-up CBG: Time:0628 CBG Result: 100  Possible Reasons for Event: Inadequate meal intake  Comments/MD notified: hypoglycemic protocol initiated     Chilton Si

## 2022-01-30 NOTE — Progress Notes (Signed)
PROGRESS NOTE   Subjective/Complaints: Team conference today Maintains good eye contact Worked ok with Vicente Males in PT today- yesterday was a great day and previous day she was very lethargic  ROS. Limited by non-verbal  Objective:   VAS [place] LOWER EXTREMITY VENOUS (DVT)  Result Date: 01/29/2022  Lower Venous DVT Study Patient Name:  Patricia Dudley  Date of Exam:   01/29/2022 Medical Rec #: [id number]           Accession #:    [id number] Date of Birth: May 02, 1930           Patient Gender: F Patient Age:   88 years Exam Location:  [facility] Procedure:      VAS [place] LOWER EXTREMITY VENOUS (DVT) Referring Phys: Leeroy Cha --------------------------------------------------------------------------------  Indications: Edema.  Limitations: Patient unable to lie still; constant movement of lower extremities. Comparison Study: No prior study Performing Technologist: Maudry Mayhew MHA, RDMS, RVT, RDCS  Examination Guidelines: A complete evaluation includes B-mode imaging, spectral Doppler, color Doppler, and power Doppler as needed of all accessible portions of each vessel. Bilateral testing is considered an integral part of a complete examination. Limited examinations for reoccurring indications may be performed as noted. The reflux portion of the exam is performed with the patient in reverse Trendelenburg.  Right Technical Findings: Not visualized segments include CFV.  +---------+---------------+---------+-----------+----------+--------------+ LEFT     CompressibilityPhasicitySpontaneityPropertiesThrombus Aging +---------+---------------+---------+-----------+----------+--------------+ CFV      Full           Yes      Yes                                 +---------+---------------+---------+-----------+----------+--------------+ SFJ      Full                                                         +---------+---------------+---------+-----------+----------+--------------+ FV Prox  Full                                                        +---------+---------------+---------+-----------+----------+--------------+ FV Mid   Full                                                        +---------+---------------+---------+-----------+----------+--------------+ FV DistalFull                                                        +---------+---------------+---------+-----------+----------+--------------+ PFV  Full                                                        +---------+---------------+---------+-----------+----------+--------------+ POP      Full           Yes      Yes                                 +---------+---------------+---------+-----------+----------+--------------+   Left Technical Findings: Not visualized segments include Limited visualization PTV and peroneal veins secondary to limitations.   Summary: LEFT: - There is no evidence of deep vein thrombosis in the lower extremity. However, portions of this examination were limited- see technologist comments above.  - No cystic structure found in the popliteal fossa.  -Complex cystic structure visualized at the proximal medial left calf measuring 3.3cm; suggestive of possible hematoma versus unknown etiology.  *See table(s) above for measurements and observations. Electronically signed by Servando Snare MD on 01/29/2022 at 9:23:09 PM.    Final    Recent Labs    01/28/22 0627  WBC 6.6  HGB 8.3*  HCT 24.9*  PLT 254      Recent Labs    01/29/22 0522 01/30/22 0636  NA 140 140  K 4.6 4.6  CL 110 112*  CO2 23 21*  GLUCOSE 164* 97  BUN 57* 52*  CREATININE 3.08* 3.05*  CALCIUM 8.1* 8.2*     Intake/Output Summary (Last 24 hours) at 01/30/2022 1159 Last data filed at 01/30/2022 0930 Gross per 24 hour  Intake 594 ml  Output --  Net 594 ml         Physical Exam: Vital Signs Blood pressure  (!) 157/93, pulse 87, temperature 98.4 F (36.9 C), resp. rate 16, height 5\' 5"  (1.651 m), weight 60.8 kg, SpO2 99 %. Constitutional: No distress . Vital signs reviewed. BMI 22.32. sitting in chair, alert and smiling HEENT: NCAT, EOMI, oral membranes moist Neck: supple Cardiovascular: Tachycardia Respiratory/Chest: CTA Bilaterally without wheezes or rales. Normal effort    GI/Abdomen: BS +, non-tender, non-distended,soft Ext: Right upper extremity swelling from IV infiltration Psych: flat but cooperates Musculoskeletal:     Cervical back: Neck supple. Protracted posture    Comments: nods head, follows simple commands. Remains non-verbal. L grip/biceps 5-/5 BUE: 3/5 grip/biceps.  No appreciable RLE and LLE movement. Skin:    General: Skin is warm and dry.  Neurological:       Alert,  Remains non-verbal. L grip/biceps 5-/5, did not move right side for moe. Delayed processing Stand pivot transfer MinA Needs set-up for meals Can sometimes vocalize yes/no Functional mobility: holds Ensure and drinks herself with straw Psychiatric:     Comments: Smiling,  +echolalia    Assessment/Plan: 1. Functional deficits which require 3+ hours per day of interdisciplinary therapy in a comprehensive inpatient rehab setting. Physiatrist is providing close team supervision and 24 hour management of active medical problems listed below. Physiatrist and rehab team continue to assess barriers to discharge/monitor patient progress toward functional and medical goals  Care Tool:  Bathing    Body parts bathed by patient: Right arm, Left arm, Chest, Face, Abdomen, Right upper leg, Left upper leg, Front perineal area   Body parts bathed by helper: Buttocks, Right lower leg,  Left lower leg Body parts n/a: Front perineal area, Buttocks   Bathing assist Assist Level: Maximal Assistance - Patient 24 - 49%     Upper Body Dressing/Undressing Upper body dressing   What is the patient wearing?: Pull over  shirt    Upper body assist Assist Level: Minimal Assistance - Patient > 75%    Lower Body Dressing/Undressing Lower body dressing      What is the patient wearing?: Pants     Lower body assist Assist for lower body dressing: Moderate Assistance - Patient 50 - 74%     Toileting Toileting    Toileting assist Assist for toileting: Moderate Assistance - Patient 50 - 74%     Transfers Chair/bed transfer  Transfers assist     Chair/bed transfer assist level: Maximal Assistance - Patient 25 - 49%     Locomotion Ambulation   Ambulation assist   Ambulation activity did not occur: Safety/medical concerns  Assist level: Minimal Assistance - Patient > 75% Assistive device: Walker-rolling Max distance: 36ft   Walk 10 feet activity   Assist  Walk 10 feet activity did not occur: Safety/medical concerns  Assist level: Minimal Assistance - Patient > 75% Assistive device: Walker-rolling   Walk 50 feet activity   Assist Walk 50 feet with 2 turns activity did not occur: Safety/medical concerns         Walk 150 feet activity   Assist Walk 150 feet activity did not occur: Safety/medical concerns         Walk 10 feet on uneven surface  activity   Assist Walk 10 feet on uneven surfaces activity did not occur: Safety/medical concerns         Wheelchair     Assist Is the patient using a wheelchair?: No   Wheelchair activity did not occur: Safety/medical concerns         Wheelchair 50 feet with 2 turns activity    Assist    Wheelchair 50 feet with 2 turns activity did not occur: Safety/medical concerns       Wheelchair 150 feet activity     Assist  Wheelchair 150 feet activity did not occur: Safety/medical concerns       Blood pressure (!) 157/93, pulse 87, temperature 98.4 F (36.9 C), resp. rate 16, height 5\' 5"  (1.651 m), weight 60.8 kg, SpO2 99 %.    Medical Problem List and Plan: 1. 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             -patient may  shower             -ELOS/Goals: expected dc 6/7 min A to mod A and Max a for auditory comprehension and Total A for verbal comprehension  Continue CIR therapies including PT, OT, and SLP   Family updated on Saturday, hospice liaison consulted for inpatient hospice  D/c to SNF tomorrow 2.  Anemia: continue Heparin Larson TID. Transfused 1U on 5/15. Post-transfusion hemoglobin trending upward nicely              -antiplatelet therapy: aspirin 81 mg daily  -Anemia Stable at HGB 8.3, continue to follow weekly at SNF 3. Pain Management: NA, d/c tylenol 4. Mood: LCSW to evaluate and provide emotional support             -antipsychotic agents: n/a 5. Neuropsych: This patient is not capable of making decisions on her own behalf. 6. Skin/Wound Care: Routine skin care checks  7. Dysphagia             -- continue Dysphagia 1 diet with thin liquids.  Crushed meds with applesauce.  SLP evaluation. MBS discussed with SLP and shows high risk of aspiration with thin liquids. Discussed with daughter and SLP thickened smoothies at home for good nutrition             -- Continue nutritional supplements, B12, MVI   8: Paroxysmal atrial fibrillation: continue Lopressor 50 mg BID --home torsemide on hold --no AC due to small bowel AVMs D/c magnesium given elevated creatinine    01/30/2022    3:34 AM 01/29/2022    7:42 PM 01/29/2022    3:26 AM  Vitals with BMI  Systolic [phone]  Diastolic 93 96 87  Pulse 87 99 78    9: Diabetes mellitus: CBGs qAC,qHS; monitor intake and activity --continue SSI --increase glipizide back to 10mg  (home dose)- changed timing to after supper due to AM hypoglycemia and elevated CBGs HS Increase Semglee to 15U started HS D/c Ensure between meals D/c HS sliding scale due to AM hypoglycemia.  5/28- Cbgs very labile- 101 this AM, but 200s-300s otherwise- will increase Semglee to 14 (from 12)  units and monitor 5/29- cbgs still labile--semglee just increased--observe today  -may need am dose of semglee 10: Hypertension: d/c Lisinopril due to worsening kidney function, continue amlodipine 10 mg, Lopressor. Repeat creatinine on Monday given increase in Lisinopril.  5/13 BP controlled today. 5/14 some variability in B/P, with mild elevation, some episodes of bradycardia (HR 46 at 051 today). -D/c lisinopril today since BUN 28->68 and Crt 2.28->3.20, with increase in past 2 days.  Hold lopressor with bradycardia episodes.  Will see how B/P trends on amlodipine 10 mg, may consider adding hydralazine if B/p is elevated.   -now hypotensive, change fluids to NS HS 12 hours -5/22 BP a little stable currently, will continue to monitor 5/27- BP slightly elevated in 150s, however off ACE inhibitor- likely up since beocming more euvolemic. Will monitor for trend.  5/29 bp under fair to reasonable control 6/5 BP well controlled    01/30/2022    3:34 AM 01/29/2022    7:42 PM 01/29/2022    3:26 AM  Vitals with BMI  Systolic [phone]  Diastolic 93 96 87  Pulse 87 99 78   11: Hyperlipidemia: continue rosuvastatin 10 mg 12: Moderate malnutrition: continue supplements 13: Chronic kidney disease stage 3b: follow-up BMP     -5/13 B/P is in the 120's today. Pt had lisinopril increased to 40 mg yesterday for elevated B/P.  Since lisinopril may increase Crt, decrease lisinopril to 20 mg daily today and push po intake and repeat BMP. -5/14 Crt up to 3.2 and BUN up to 68.  Fluid bolus (1L NS) and continuous fluid (0.45 NS at 75 ml/hr) given for rise in BUN and Crt. -d/c lisinopril (per Dr. Christella Noa) since could be a possible cause of elevated Crt . Cr elevated, nephrology consulted. Urine studies obtained, creatinine improving, continue D5W 5/27- Cr down to 2.75 from 3.01- Renal on board- BUN 47 5/29 bun improved to 48, Cr still 2.75--renal has signed off.  6/5 Cr elevated to 3.35 today, discussed with  nephrology, will change fluids to D5 1/2 normal saline at 75 ml/hr    Latest Ref Rng & Units 01/30/2022    6:36 AM 01/29/2022    5:22 AM 01/28/2022    6:27 AM  BMP  Glucose 70 -  99 mg/dL 97   164   169    BUN 8 - 23 mg/dL 52   57   52    Creatinine 0.44 - 1.00 mg/dL 3.05   3.08   3.35    Sodium 135 - 145 mmol/L 140   140   136    Potassium 3.5 - 5.1 mmol/L 4.6   4.6   4.7    Chloride 98 - 111 mmol/L 112   110   108    CO2 22 - 32 mmol/L 21   23   22     Calcium 8.9 - 10.3 mg/dL 8.2   8.1   8.1      14: Glaucoma: continue latanoprost drops 15: Hypothyroidism: continue Synthroid 16: Diarrhea/loose stools: monitor; getting loperamide 2 mg daily (BID prn) -continue current regimen. 17: Bowel and bladder incontinence: start bowel and bladder program 18. Daytime somnolence: [employer] and SW if therapy can be scheduled a little later in the day and if daughters can be present to help motivate her  5/27- still sleepy- might be worth looking into Ritalin? 19. Bradycardia: discontinue lopressor. 5/27- HR in 40s- just for last 12 hours- will recheck with nursing- if still less than 60, will order EKG  20. History of GI bleed: start protonix.  21. Hypotension: resolved, hemoglobin reviewed and stable 22. Neurogenic bladder: bladder program ordered starting 5/11. 23. Stool incontinence: fiber added to bulk stool 24. Screening for vitamin D deficiency: Vitamin D level 50: start daily supplement 25. Fatigue: Iron reviewed and is 68, HGB slightly improved to 9.9. Continue vitamin D 1,000U daily 26. Elevated chloride: elevating again, increase D5 to 70mls/hour, repeat chloride today 27. Hypernatremia: resolved, Na 140,increase D5 1/2 normal saline at to 75 ml/hr, repeat labs tomorrow.  28. Tachycardia: Toprol XL ordered. Increase to 25mg .  29. Hyperkalemia  -6/5 K+ 4.7 today, she had insulin          LOS: 27 days A FACE TO FACE EVALUATION WAS PERFORMED  Juell Radney P Giulian Goldring 01/30/2022, 11:59 AM

## 2022-01-30 NOTE — Progress Notes (Signed)
[facility]) Hospital Liaison note:  Notified by [facility] of request for [facility] Palliative Care services. Will continue to follow for disposition.  Please call with any outpatient palliative questions or concerns.  Thank you for the opportunity to participate in this patient's care.  Thank you, Lorelee Market, LPN [facility] Liaison [phone]

## 2022-01-30 NOTE — Progress Notes (Signed)
Occupational Therapy Session Note  Patient Details  Name: Patricia Dudley MRN: [id number] Date of Birth: Sep 27, 1929  Today's Date: 01/30/2022 OT Individual Time: 1300-1408 OT Individual Time Calculation (min): 68 min  and Today's Date: 01/30/2022 OT Missed Time: 7 Minutes Missed Time Reason: Patient fatigue   Short Term Goals: Week 4:  OT Short Term Goal 1 (Week 4): STG + LTG 2/2 ELOS  Skilled Therapeutic Interventions/Progress Updates:  Skilled OT intervention completed with focus on dc planning, oral care, toileting and activity tolerance. Pt received seated in w/c, non-verbal throughout session other than saying "bye" to therapist with NAD noted. Pt's daughter finished assisting pt with lunch, therefore therapist educated family on the change of lesser intensity from inpatient rehab vs SNF and ways to maintain pt's strength and abilities even with less therapy. Therapist completing oral care and suctioning with no pocketing noted with total A with multimodal cues needed for opening mouth however pt more compliant today. Per toileting board, pt due for toileting in 30 mins therefore assisted pt with toileting for conclusion of session. Completed sit > stand with mod A using RW then mod A stand pivot to [facility] with physical assist needed for positioning RW for pt. Pt found to be incontinent of void only in brief, however upon sitting was continent of B and B. Pt does attempt to stand on several accounts despite continuing to have BM, therefore required cues to stay seated. Sit > stand with mod A using RW, then total A pericare however pt attempting to help donn brief, however requiring max A 2/2 fatigue. Stand pivot back to w/c with initially mod A however pt freezing midway and attempting to sit therefore required total A to pivot to safe placement in w/c. Max A double HHA for stand pivot to EOB, with max A needed for laying supine. Pt's IV found to be occluded, with therapist notifying nursing of  status. Pt was was left upright in bed, with bed alarm on, daughter in room and all needs in reach at end of session.   Therapy Documentation Precautions:  Precautions Precautions: Fall Precaution Comments: aspiration precautions, aphasia Restrictions Weight Bearing Restrictions: No    Therapy/Group: Individual Therapy  Blase Mess, MS, OTR/L  01/30/2022, 7:24 AM

## 2022-01-30 NOTE — Progress Notes (Signed)
Inpatient Diabetes Program Recommendations  AACE/ADA: New Consensus Statement on Inpatient Glycemic Control (2015)  Target Ranges:  Prepandial:   less than 140 mg/dL      Peak postprandial:   less than 180 mg/dL (1-2 hours)      Critically ill patients:  140 - 180 mg/dL   Lab Results  Component Value Date   GLUCAP 100 (H) 01/30/2022   HGBA1C 6.5 (H) 12/25/2021    Review of Glycemic Control  Latest Reference Range & Units 01/29/22 05:45 01/29/22 11:31 01/29/22 16:34 01/29/22 21:02 01/30/22 05:37 01/30/22 06:28  Glucose-Capillary 70 - 99 mg/dL 134 (H) 281 (H) 225 (H) 302 (H) 69 (L) 100 (H)  (H): Data is abnormally high (L): Data is abnormally low  Current orders for Inpatient glycemic control: Novolog 0-6 units TID, Semglee 17 units QD, Glipizide 10 mg after supper  Inpatient Diabetes Program Recommendations:    Hypoglycemia of 69 mg/dL this morning.  Receieved 17 units of Novolog last evening for a cbg of 302 mg/dL.  Postprandials are elevated.  Please consider,  Novolog 3 units TID with meals if eats at least 50% DC glipizide while inpatient   Will continue to follow while inpatient.  Thank you, Reche Dixon, MSN, [place] Diabetes Coordinator Inpatient Diabetes Program [phone] (team pager from 8a-5p)

## 2022-01-30 NOTE — Progress Notes (Addendum)
Patient ID: Patricia Dudley, female   DOB: 1930/04/01, 88 y.o.   MRN: [id number]  Patient will transport to SNF tomorrow PTAR arranged for 10AM

## 2022-01-30 NOTE — Plan of Care (Signed)
  Problem: RH Swallowing Goal: LTG Patient will consume least restrictive diet using compensatory strategies with assistance (SLP) Description: LTG:  Patient will consume least restrictive diet using compensatory strategies with assistance (SLP) Outcome: Completed/Met Flowsheets (Taken 01/04/2022 1324) LTG: Pt Patient will consume least restrictive diet using compensatory strategies with assistance of (SLP): Moderate Assistance - Patient 50 - 74%   Problem: RH Comprehension Communication Goal: LTG Patient will comprehend basic/complex auditory (SLP) Description: LTG: Patient will comprehend basic/complex auditory information with cues (SLP). Outcome: Completed/Met Flowsheets (Taken 01/04/2022 1324) LTG: Patient will comprehend: Basic auditory information LTG: Patient will comprehend auditory information with cueing (SLP): Maximal Assistance - Patient 25 - 49%   Problem: RH Expression Communication Goal: LTG Patient will express needs/wants via multi-modal(SLP) Description: LTG:  Patient will express needs/wants via multi-modal communication (gestures/written, etc) with cues (SLP) Outcome: Completed/Met Flowsheets (Taken 01/30/2022 2231) LTG: Patient will express needs/wants via multimodal communication (gestures/written, etc) with cueing (SLP): Maximal Assistance - Patient 25 - 49%   Problem: RH Attention Goal: LTG Patient will demonstrate this level of attention during functional activites (SLP) Description: LTG:  Patient will will demonstrate this level of attention during functional activites (SLP) Outcome: Completed/Met Flowsheets Taken 01/30/2022 2231 Patient will demonstrate during cognitive/linguistic activities the attention type of: Focused LTG: Patient will demonstrate this level of attention during cognitive/linguistic activities with assistance of (SLP): Moderate Assistance - Patient 50 - 74% Taken 01/04/2022 1324 Patient will demonstrate this level of attention during  cognitive/linguistic activities in: Controlled Number of minutes patient will demonstrate attention during cognitive/linguistic activities: 5

## 2022-01-31 LAB — GLUCOSE, CAPILLARY
Glucose-Capillary: 51 mg/dL — ABNORMAL LOW (ref 70–99)
Glucose-Capillary: 54 mg/dL — ABNORMAL LOW (ref 70–99)
Glucose-Capillary: 62 mg/dL — ABNORMAL LOW (ref 70–99)
Glucose-Capillary: 128 mg/dL — ABNORMAL HIGH (ref 70–99)
Glucose-Capillary: 199 mg/dL — ABNORMAL HIGH (ref 70–99)

## 2022-01-31 LAB — BASIC METABOLIC PANEL
Anion gap: 9 (ref 5–15)
BUN: 52 mg/dL — ABNORMAL HIGH (ref 8–23)
CO2: 22 mmol/L (ref 22–32)
Calcium: 8.2 mg/dL — ABNORMAL LOW (ref 8.9–10.3)
Chloride: 109 mmol/L (ref 98–111)
Creatinine, Ser: 2.84 mg/dL — ABNORMAL HIGH (ref 0.44–1.00)
GFR, Estimated: 15 mL/min — ABNORMAL LOW (ref >60)
Glucose, Bld: 58 mg/dL — ABNORMAL LOW (ref 70–99)
Potassium: 4.4 mmol/L (ref 3.5–5.1)
Sodium: 140 mmol/L (ref 135–145)

## 2022-01-31 MED ORDER — METOPROLOL TARTRATE 25 MG PO TABS: 12.5000 mg | ORAL_TABLET | Freq: Two times a day (BID) | ORAL | Status: DC

## 2022-01-31 MED ORDER — PANTOPRAZOLE SODIUM 40 MG PO PACK: 40.0000 mg | PACK | Freq: Every day | ORAL | Status: DC

## 2022-01-31 NOTE — Progress Notes (Signed)
Inpatient Rehabilitation Discharge Medication Review by a Pharmacist  A complete drug regimen review was completed for this patient to identify any potential clinically significant medication issues.  High Risk Drug Classes Is patient taking? Indication by Medication  Antipsychotic No   Anticoagulant No   Antibiotic No   Opioid No   Antiplatelet Yes Aspirin: stroke ppx  Hypoglycemics/insulin Yes Insulin glargine, glipizide: diabetes  Vasoactive Medication Yes Amlodipine, metoprolol: hypertension  Chemotherapy No   Other Yes Alendronate: bone support Niferex, cyanocobalamin: supplementation Latanoprost: glaucoma Loratadine: allergies Levothyroxine: hypothyoid tx Methocarbamol: PRN muscle spasms Pantoprazole: GERD Rosuvastatin: hyperlipidemia Trazodone: PRN sleep     Type of Medication Issue Identified Description of Issue Recommendation(s)  Drug Interaction(s) (clinically significant)     Duplicate Therapy     Allergy     No Medication Administration End Date     Incorrect Dose     Additional Drug Therapy Needed     Significant med changes from prior encounter (inform family/care partners about these prior to discharge). Medications discontinued: - atorvastatin, torsemide, insulin aspart, fluticasone, lisinopril  New medications: - aspirin Communicate medication changes with patient/family at discharge  Other       Clinically significant medication issues were identified that warrant physician communication and completion of prescribed/recommended actions by midnight of the next day:  No   Pharmacist comments: n/a   Time spent performing this drug regimen review (minutes): 20   Thank you for allowing pharmacy to be a part of this patient's care.  Ardyth Harps, PharmD Clinical Pharmacist

## 2022-01-31 NOTE — Progress Notes (Signed)
Patient discharged to [facility] place via [place]. All belongings and equipment sent with patients daughter

## 2022-01-31 NOTE — Progress Notes (Signed)
Hypoglycemic Event  CBG: 54  Treatment: 4 oz Orange Juice with sugar  Symptoms: no symptoms  Follow-up CBG: Time: 0615 CBG Result: 51 @0610  64 @0635   Possible Reasons for Event: inadequate intake   Comments/MD notified: Dan PA notified.     [place]

## 2022-01-31 NOTE — Progress Notes (Signed)
Inpatient Diabetes Program Recommendations  AACE/ADA: New Consensus Statement on Inpatient Glycemic Control (2015)  Target Ranges:  Prepandial:   less than 140 mg/dL      Peak postprandial:   less than 180 mg/dL (1-2 hours)      Critically ill patients:  140 - 180 mg/dL   Lab Results  Component Value Date   GLUCAP 128 (H) 01/31/2022   HGBA1C 6.5 (H) 12/25/2021    Review of Glycemic Control  Latest Reference Range & Units 01/31/22 05:51 01/31/22 06:10 01/31/22 06:34 01/31/22 08:17  Glucose-Capillary 70 - 99 mg/dL 54 (L) 51 (L) 62 (L) 128 (H)  (L): Data is abnormally low (H): Data is abnormally high  Latest Reference Range & Units 01/31/22 05:44  GFR, Estimated >60 mL/min 15 (L)  (L): Data is abnormally low  Home diabetes medications: Glipizide 10 mg QPM  Current orders for Inpatient glycemic control: Semglee 17 units QD, Glipizide 10 mg after supper   Inpatient Diabetes Program Recommendations:    Hypoglycemia again this morning.  Please:  DC Glipizide Decrease Semglee to 10 units QHS Add Novolog 0-6 units TID and 0-5 units QHS  Will continue to follow while inpatient.  Thank you, Reche Dixon, MSN, [place] Diabetes Coordinator Inpatient Diabetes Program [phone] (team pager from 8a-5p)

## 2022-01-31 NOTE — Progress Notes (Signed)
PROGRESS NOTE   Subjective/Complaints: Hypoglycemic this AM- SSI discontinued.  Stable for d/c to SNF today Na, Cl, and Cr stable this morning.  Sleepy  ROS. Limited by non-verbal  Objective:   VAS [place] LOWER EXTREMITY VENOUS (DVT)  Result Date: 01/29/2022  Lower Venous DVT Study Patient Name:  Patricia Dudley  Date of Exam:   01/29/2022 Medical Rec #: [id number]           Accession #:    [id number] Date of Birth: 14-May-1930           Patient Gender: F Patient Age:   88 years Exam Location:  [facility] Procedure:      VAS [place] LOWER EXTREMITY VENOUS (DVT) Referring Phys: Leeroy Cha --------------------------------------------------------------------------------  Indications: Edema.  Limitations: Patient unable to lie still; constant movement of lower extremities. Comparison Study: No prior study Performing Technologist: Maudry Mayhew MHA, RDMS, RVT, RDCS  Examination Guidelines: A complete evaluation includes B-mode imaging, spectral Doppler, color Doppler, and power Doppler as needed of all accessible portions of each vessel. Bilateral testing is considered an integral part of a complete examination. Limited examinations for reoccurring indications may be performed as noted. The reflux portion of the exam is performed with the patient in reverse Trendelenburg.  Right Technical Findings: Not visualized segments include CFV.  +---------+---------------+---------+-----------+----------+--------------+ LEFT     CompressibilityPhasicitySpontaneityPropertiesThrombus Aging +---------+---------------+---------+-----------+----------+--------------+ CFV      Full           Yes      Yes                                 +---------+---------------+---------+-----------+----------+--------------+ SFJ      Full                                                         +---------+---------------+---------+-----------+----------+--------------+ FV Prox  Full                                                        +---------+---------------+---------+-----------+----------+--------------+ FV Mid   Full                                                        +---------+---------------+---------+-----------+----------+--------------+ FV DistalFull                                                        +---------+---------------+---------+-----------+----------+--------------+ PFV      Full                                                        +---------+---------------+---------+-----------+----------+--------------+  POP      Full           Yes      Yes                                 +---------+---------------+---------+-----------+----------+--------------+   Left Technical Findings: Not visualized segments include Limited visualization PTV and peroneal veins secondary to limitations.   Summary: LEFT: - There is no evidence of deep vein thrombosis in the lower extremity. However, portions of this examination were limited- see technologist comments above.  - No cystic structure found in the popliteal fossa.  -Complex cystic structure visualized at the proximal medial left calf measuring 3.3cm; suggestive of possible hematoma versus unknown etiology.  *See table(s) above for measurements and observations. Electronically signed by Servando Snare MD on 01/29/2022 at 9:23:09 PM.    Final    No results for input(s): "WBC", "HGB", "HCT", "PLT" in the last 72 hours.     Recent Labs    01/30/22 0636 01/31/22 0544  NA 140 140  K 4.6 4.4  CL 112* 109  CO2 21* 22  GLUCOSE 97 58*  BUN 52* 52*  CREATININE 3.05* 2.84*  CALCIUM 8.2* 8.2*     Intake/Output Summary (Last 24 hours) at 01/31/2022 0713 Last data filed at 01/30/2022 1700 Gross per 24 hour  Intake 236 ml  Output --  Net 236 ml         Physical Exam: Vital Signs Blood pressure (!)  154/55, pulse 88, temperature 97.9 F (36.6 C), resp. rate 17, height 5\' 5"  (1.651 m), weight 60.8 kg, SpO2 99 %. Constitutional: No distress . Vital signs reviewed. BMI 22.32. sitting in chair, alert and smiling HEENT: NCAT, EOMI, oral membranes moist Neck: supple Cardiovascular: Tachycardia Respiratory/Chest: CTA Bilaterally without wheezes or rales. Normal effort    GI/Abdomen: BS +, non-tender, non-distended,soft Ext: Right upper extremity swelling from IV infiltration Psych: flat but cooperates Musculoskeletal:     Cervical back: Neck supple. Protracted posture    Comments: nods head, follows simple commands. Remains non-verbal. L grip/biceps 5-/5 BUE: 3/5 grip/biceps.  No appreciable RLE and LLE movement. Skin:    General: Skin is warm and dry.  Neurological:       Alert,  Remains non-verbal. L grip/biceps 5-/5, did not move right side for moe. Delayed processing Stand pivot transfer MinA Needs set-up for meals Can sometimes vocalize yes/no Functional mobility: holds Ensure and drinks herself with straw. Sit to stand [place] with RW Psychiatric:     Comments: Smiling,  +echolalia    Assessment/Plan: 1. Functional deficits which require 3+ hours per day of interdisciplinary therapy in a comprehensive inpatient rehab setting. Physiatrist is providing close team supervision and 24 hour management of active medical problems listed below. Physiatrist and rehab team continue to assess barriers to discharge/monitor patient progress toward functional and medical goals  Care Tool:  Bathing    Body parts bathed by patient: Right arm, Left arm, Chest, Face, Abdomen, Right upper leg, Left upper leg, Front perineal area   Body parts bathed by helper: Buttocks, Right lower leg, Left lower leg Body parts n/a: Front perineal area, Buttocks   Bathing assist Assist Level: Maximal Assistance - Patient 24 - 49%     Upper Body Dressing/Undressing Upper body dressing   What is the patient  wearing?: Pull over shirt    Upper body assist Assist Level: Minimal Assistance - Patient > 75%  Lower Body Dressing/Undressing Lower body dressing      What is the patient wearing?: Pants     Lower body assist Assist for lower body dressing: Moderate Assistance - Patient 50 - 74%     Toileting Toileting    Toileting assist Assist for toileting: Moderate Assistance - Patient 50 - 74%     Transfers Chair/bed transfer  Transfers assist     Chair/bed transfer assist level: Minimal Assistance - Patient > 75%     Locomotion Ambulation   Ambulation assist   Ambulation activity did not occur: Safety/medical concerns  Assist level: Minimal Assistance - Patient > 75% Assistive device: Walker-rolling Max distance: 46ft   Walk 10 feet activity   Assist  Walk 10 feet activity did not occur: Safety/medical concerns  Assist level: Minimal Assistance - Patient > 75% Assistive device: Walker-rolling   Walk 50 feet activity   Assist Walk 50 feet with 2 turns activity did not occur: Safety/medical concerns (fatigue, weakness, poor motor planning/sequencing)         Walk 150 feet activity   Assist Walk 150 feet activity did not occur: Safety/medical concerns (fatigue, weakness, poor motor planning/sequencing)         Walk 10 feet on uneven surface  activity   Assist Walk 10 feet on uneven surfaces activity did not occur: Safety/medical concerns (fatigue, weakness, poor motor planning/sequencing)         Wheelchair     Assist Is the patient using a wheelchair?: Yes Type of Wheelchair: Manual Wheelchair activity did not occur: Safety/medical concerns  Wheelchair assist level: Dependent - Patient 0% Max wheelchair distance: 155ft    Wheelchair 50 feet with 2 turns activity    Assist    Wheelchair 50 feet with 2 turns activity did not occur: Safety/medical concerns   Assist Level: Dependent - Patient 0%   Wheelchair 150 feet activity      Assist  Wheelchair 150 feet activity did not occur: Safety/medical concerns   Assist Level: Dependent - Patient 0%   Blood pressure (!) 154/55, pulse 88, temperature 97.9 F (36.6 C), resp. rate 17, height 5\' 5"  (1.651 m), weight 60.8 kg, SpO2 99 %.    Medical Problem List and Plan: 1. 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             -patient may  shower             -ELOS/Goals: expected dc 6/7 min A to mod A and Max a for auditory comprehension and Total A for verbal comprehension  Continue CIR therapies including PT, OT, and SLP   Family updated on Saturday, hospice liaison consulted for inpatient hospice  D/c to SNF tomorrow 2.  Anemia: continue Heparin [place] TID. Transfused 1U on 5/15. Post-transfusion hemoglobin trending upward nicely              -antiplatelet therapy: aspirin 81 mg daily  -Anemia Stable at HGB 8.3, continue to follow weekly at SNF 3. Pain Management: NA, d/c tylenol 4. Mood: LCSW to evaluate and provide emotional support             -antipsychotic agents: n/a 5. Neuropsych: This patient is not capable of making decisions on her own behalf. 6. Skin/Wound Care: Routine skin care checks 7. Dysphagia             -- continue Dysphagia 1 diet with thin liquids.  Crushed meds with applesauce.  SLP evaluation.  MBS discussed with SLP and shows high risk of aspiration with thin liquids. Discussed with daughter and SLP thickened smoothies at home for good nutrition             -- Continue nutritional supplements, B12, MVI   8: Paroxysmal atrial fibrillation: continue Lopressor 50 mg BID --home torsemide on hold --no AC due to small bowel AVMs D/c magnesium given elevated creatinine    01/31/2022    3:06 AM 01/30/2022    7:51 PM 01/30/2022    2:34 PM  Vitals with BMI  Systolic [phone]  Diastolic 55 76 92  Pulse 88 88 86    9: Diabetes mellitus: CBGs qAC,qHS; monitor intake and activity --d/c  SSI --increase glipizide back to 10mg  (home dose)- changed timing to after supper due to AM hypoglycemia and elevated CBGs HS Increase Semglee to 15U started HS D/c Ensure between meals D/c HS sliding scale due to AM hypoglycemia.  5/28- Cbgs very labile- 101 this AM, but 200s-300s otherwise- will increase Semglee to 14 (from 12) units and monitor 5/29- cbgs still labile--semglee just increased--observe today  -may need am dose of semglee 10: Hypertension: d/c Lisinopril due to worsening kidney function, continue amlodipine 10 mg, Lopressor. Repeat creatinine on Monday given increase in Lisinopril.  5/13 BP controlled today. 5/14 some variability in B/P, with mild elevation, some episodes of bradycardia (HR 46 at 051 today). -D/c lisinopril today since BUN 28->68 and Crt 2.28->3.20, with increase in past 2 days.  Hold lopressor with bradycardia episodes.  Will see how B/P trends on amlodipine 10 mg, may consider adding hydralazine if B/p is elevated.   -now hypotensive, change fluids to NS HS 12 hours -5/22 BP a little stable currently, will continue to monitor 5/27- BP slightly elevated in 150s, however off ACE inhibitor- likely up since beocming more euvolemic. Will monitor for trend.  5/29 bp under fair to reasonable control 6/8 BP elevated, continue to monitor TID    01/31/2022    3:06 AM 01/30/2022    7:51 PM 01/30/2022    2:34 PM  Vitals with BMI  Systolic [phone]  Diastolic 55 76 92  Pulse 88 88 86   11: Hyperlipidemia: continue rosuvastatin 10 mg 12: Moderate malnutrition: continue supplements 13: Chronic kidney disease stage 3b: follow-up BMP     -5/13 B/P is in the 120's today. Pt had lisinopril increased to 40 mg yesterday for elevated B/P.  Since lisinopril may increase Crt, decrease lisinopril to 20 mg daily today and push po intake and repeat BMP. -5/14 Crt up to 3.2 and BUN up to 68.  Fluid bolus (1L NS) and continuous fluid (0.45 NS at 75 ml/hr) given for rise in BUN and  Crt. -d/c lisinopril (per Dr. Christella Noa) since could be a possible cause of elevated Crt . Cr elevated, nephrology consulted. Urine studies obtained, creatinine improving, continue D5W 5/27- Cr down to 2.75 from 3.01- Renal on board- BUN 47 5/29 bun improved to 48, Cr still 2.75--renal has signed off.  6/5 Cr elevated to 3.35 today, discussed with nephrology, will change fluids to D5 1/2 normal saline at 75 ml/hr    Latest Ref Rng & Units 01/31/2022    5:44 AM 01/30/2022    6:36 AM 01/29/2022    5:22 AM  BMP  Glucose 70 - 99 mg/dL 58  97  164   BUN 8 - 23 mg/dL 52  52  57   Creatinine 0.44 - 1.00 mg/dL 2.84  3.05  3.08   Sodium 135 - 145 mmol/L 140  140  140   Potassium 3.5 - 5.1 mmol/L 4.4  4.6  4.6   Chloride 98 - 111 mmol/L 109  112  110   CO2 22 - 32 mmol/L 22  21  23    Calcium 8.9 - 10.3 mg/dL 8.2  8.2  8.1     14: Glaucoma: continue latanoprost drops 15: Hypothyroidism: continue Synthroid 16: Diarrhea/loose stools: monitor; getting loperamide 2 mg daily (BID prn) -continue current regimen. 17: Bowel and bladder incontinence: start bowel and bladder program 18. Daytime somnolence: [employer] and SW if therapy can be scheduled a little later in the day and if daughters can be present to help motivate her  5/27- still sleepy- might be worth looking into Ritalin? 19. Bradycardia: discontinue lopressor. 5/27- HR in 40s- just for last 12 hours- will recheck with nursing- if still less than 60, will order EKG  20. History of GI bleed: start protonix.  21. Hypotension: resolved, hemoglobin reviewed and stable 22. Neurogenic bladder: bladder program ordered starting 5/11. 23. Stool incontinence: fiber added to bulk stool 24. Screening for vitamin D deficiency: Vitamin D level 50: start daily supplement 25. Fatigue: Iron reviewed and is 68, HGB slightly improved to 9.9. Continue vitamin D 1,000U daily 26. Elevated chloride: elevating again, increase D5 to 50mls/hour, repeat chloride  today 27. Hypernatremia: resolved, Na 140,increase D5 1/2 normal saline at to 75 ml/hr, repeat labs tomorrow.  28. Tachycardia: Toprol XL ordered. Increase to 25mg .  29. Hyperkalemia  -6/5 K+ 4.7 today, she had insulin          LOS: 28 days A FACE TO FACE EVALUATION WAS PERFORMED  Martha Clan P Suprena Travaglini 01/31/2022, 7:13 AM

## 2022-01-31 NOTE — Progress Notes (Signed)
Inpatient Rehabilitation Care Coordinator Discharge Note   Patient Details  Name: Patricia Dudley MRN: [id number] Date of Birth: 12-27-1929   Discharge location: SNF  Length of Stay:    Discharge activity level: MOD/MAX  Home/community participation: daughters  Patient response OB:SJGGEZ Literacy - How often do you need to have someone help you when you read instructions, pamphlets, or other written material from your doctor or pharmacy?: Patient unable to respond  Patient response MO:QHUTML Isolation - How often do you feel lonely or isolated from those around you?: Patient unable to respond  Services provided included: SW, Pharmacy, TR, CM, RN, SLP, OT, PT, RD, MD  Financial Services:  Financial Services Utilized: Other (Comment)    Choices offered to/list presented to:    Follow-up services arranged:  Other (Comment)           Patient response to transportation need: Is the patient able to respond to transportation needs?: No In the past 12 months, has lack of transportation kept you from medical appointments or from getting medications?: No In the past 12 months, has lack of transportation kept you from meetings, work, or from getting things needed for daily living?: No    Comments (or additional information):  Patient/Family verbalized understanding of follow-up arrangements:  Yes  Individual responsible for coordination of the follow-up plan: [name] [phone]  Confirmed correct DME delivered: Dyanne Iha 01/31/2022    Dyanne Iha

## 2022-02-15 ENCOUNTER — Non-Acute Institutional Stay: Payer: Medicare PPO | Admitting: Student

## 2022-02-15 DIAGNOSIS — R1310 Dysphagia, unspecified: Secondary | ICD-10-CM

## 2022-02-15 DIAGNOSIS — I639 Cerebral infarction, unspecified: Secondary | ICD-10-CM

## 2022-02-15 DIAGNOSIS — Z515 Encounter for palliative care: Secondary | ICD-10-CM

## 2022-02-15 DIAGNOSIS — R531 Weakness: Secondary | ICD-10-CM

## 2022-02-28 ENCOUNTER — Telehealth: Payer: Self-pay | Admitting: Student

## 2022-02-28 NOTE — Telephone Encounter (Signed)
Palliative NP returned call to patient's daughter. Provided update on Palliative visit. Patient will be returning home on Friday. She has equipment needed in the home. She is awaiting suction machine; patient will continue on pureed diet. Patient is a DNR, MOST form in place. She would like for palliative to continue following patient at home. Will have palliative admin reach out to patient's PCP for order.

## 2022-03-05 ENCOUNTER — Encounter: Payer: Medicare PPO | Admitting: Physical Medicine and Rehabilitation

## 2022-03-07 ENCOUNTER — Telehealth: Payer: Self-pay | Admitting: Student

## 2022-03-07 NOTE — Telephone Encounter (Signed)
Spoke with patient's daughter [name] regarding the Palliative referral/services and she was in agreement with beginning services.  I have scheduled a [place] for 03/12/22 @ 10:30 AM.

## 2022-03-12 ENCOUNTER — Other Ambulatory Visit: Payer: Medicare PPO | Admitting: Student

## 2022-03-12 DIAGNOSIS — Z515 Encounter for palliative care: Secondary | ICD-10-CM

## 2022-03-12 DIAGNOSIS — N183 Chronic kidney disease, stage 3 unspecified: Secondary | ICD-10-CM

## 2022-03-12 DIAGNOSIS — R1310 Dysphagia, unspecified: Secondary | ICD-10-CM

## 2022-03-12 DIAGNOSIS — R52 Pain, unspecified: Secondary | ICD-10-CM

## 2022-03-12 DIAGNOSIS — R531 Weakness: Secondary | ICD-10-CM

## 2022-03-12 NOTE — Progress Notes (Signed)
[facility] Consult Note Telephone: [phone]  Fax: [phone]    Date of encounter: 03/12/22 10:35 AM PATIENT NAME: Patricia Dudley [address] [place] [address]   [phone] (home)  DOB: 01/27/1930 MRN: [id number] PRIMARY CARE PROVIDER:    Tracie Harrier, MD,  [address] [place] [address] [place] PROVIDER:   Tracie Harrier, [place],  [place] [address] [phone]  RESPONSIBLE PARTY:    Contact Information     Name Relation Home Work Mobile   [place] Daughter [phone]     [facility] Daughter   [phone]   Rachel Moulds Daughter [phone]     Devoria Albe Daughter [phone]         Due to the COVID-19 crisis, this visit was done via telemedicine from my office and it was initiated and consent by this patient and or family.  I connected with  Patricia Dudley OR PROXY on 03/12/22 by telephone due to the patient's or PROXY inability to connect via an audiovisual connection or their refusal to have an in-person visit. This connection was agreed to by patient or PROXY and verified that I am speaking with the correct person using two identifiers.I discussed the limitations of evaluation and management by telemedicine. The patient expressed understanding and agreed to proceed.                                       ASSESSMENT AND PLAN / RECOMMENDATIONS:   Advance Care Planning/Goals of Care: Goals include to maximize quality of life and symptom management. Patient/health care surrogate gave his/her permission to discuss. Our advance care planning conversation included a discussion about:    The value and importance of advance care planning  Experiences with loved ones who have been seriously ill or have died  Exploration of personal, cultural or spiritual beliefs that might influence  medical decisions  Exploration of goals of care in the event of a sudden injury or illness  CODE STATUS: DNR  Education provided on Palliative Medicine vs. Hospice services. Will provide ongoing support symptom management; will monitor for changes/declines. Patient currently receiving home health. Family would like for patient to be able to remain at home. Open to hospitalizations as needed.   Symptom Management/Plan:  Generalized weakness-secondary to stroke. Patient currently receiving therapy; continue OT/PT as directed. Hoyer lift ordered per PCP to assist in getting patient out of bed.   Dysphagia-due to stroke. Continue pureed diet with nectar thick liquids. Crush medications, head of bed elevated. Monitor for worsening dysphagia.  Pain-c/o leg pain. Recommend acetaminophen 650 mg TID PRN.   T2DM-continue Lantus 19 units QHS; monitor for signs of hypo, hyperglycemia. Recommend HS snack to reduce lower AM blood sugars. Follow up with PCP as directed.    Follow up Palliative Care Visit: Palliative care will continue to follow for complex medical decision making, advance care planning, and clarification of goals. Return in 4 weeks or prn.  I spent 13  minutes providing this consultation. More than 50% of the time in this consultation was spent in counseling and care coordination.    HOSPICE ELIGIBILITY/DIAGNOSIS: TBD  Chief Complaint: Palliative Medicine follow up visit.   HISTORY OF PRESENT ILLNESS:  Patricia Dudley is a 88 y.o. year old female  with cerebral infarction, vascular dementia, aphasia, dysphagia,  paroxysmal atrial fibrillation-not on anticoagulation, hyperlipidemia hypertensive chronic kidney disease, CKD 4, anemia, type 2 diabetes, glaucoma, neurogenic bladder, vitamin D deficiency, hx of GI bleed.  Patient hospitalized 5/11-01/31/22 due to acute embolic stroke, CKD, hypernatremia, hyperkalemia, debility.  Patient has returned home; good family support. She is to  receive PT/OT/ST with [facility]. Doing better per daughter. She endorses occasional leg pain, no shortness of breath, nausea, constipation. She is eating good; being fed. Occasional cough with eating. She is receiving a pureed diet with nectar thick liquids. Her glipizide was stopped due inability to crush; she is receiving Lantus QHS 19 units. AM blood sugars around 110-120 mg/dL usually. This am, blood sugar was 64 mg/dL.. HS blood sugars are 240-255 mg/dL.Therapy has recommended hoyer lift for patient to be able to get out of bed.   History obtained from review of EMR, discussion with primary team, and interview with family, facility staff/caregiver and/or Patricia Dudley.  I reviewed available labs, medications, imaging, studies and related documents from the EMR.  Records reviewed and summarized above.   Physical Exam:  Deferred d/t this being a telemedicine visit.   Thank you for the opportunity to participate in the care of Patricia Dudley.  The palliative care team will continue to follow. Please call our office at [phone] if we can be of additional assistance.   Ezekiel Slocumb, NP   COVID-19 PATIENT SCREENING TOOL Asked and negative response unless otherwise noted:   Have you had symptoms of covid, tested positive or been in contact with someone with symptoms/positive test in the past 5-10 days? No

## 2022-03-12 NOTE — Progress Notes (Signed)
[facility] Consult Note Telephone: [phone]  Fax: [phone]   Date of encounter: 02/15/2022  PATIENT NAME: Patricia Dudley [address] [place] [address]   [phone] (home)  DOB: 06-16-1930 MRN: [id number] PRIMARY CARE PROVIDER:    Tracie Harrier, MD,  [address] [place] [address] [phone]  REFERRING PROVIDER:   Vilinda Boehringer, NP  RESPONSIBLE PARTY:    Contact Information     Name Relation Home Work Mobile   [place] Daughter [phone]     [facility] Daughter   [phone]   Rachel Moulds Daughter [phone]     Devoria Albe Daughter [phone]          I met face to face with patient in the facility. Palliative Care was asked to follow this patient by consultation request of  Vilinda Boehringer, NP to address advance care planning and complex medical decision making. This is the initial visit.                                     ASSESSMENT AND PLAN / RECOMMENDATIONS:   Advance Care Planning/Goals of Care: Goals include to maximize quality of life and symptom management. Patient/health care surrogate gave his/her permission to discuss.Our advance care planning conversation included a discussion about:    The value and importance of advance care planning  Experiences with loved ones who have been seriously ill or have died  Exploration of personal, cultural or spiritual beliefs that might influence medical decisions  Exploration of goals of care in the event of a sudden injury or illness  MOST form in place at facility Will review goals of care with family. CODE STATUS: DNR  Palliative Medicine will provide supportive care, symptom management.   Symptom Management/Plan:  Stroke-patient with acute embolic stroke, aphasia, dysphagia. Facility staff to continue assisting with all adl's. Continue therapy as directed. Continue pureed diet with  NTL for her dysphagia; monitor for increased swallowing difficulty. Aspiration precautions in place. Fair appetite; monitor for further weight loss.   Generalized weakness-secondary to stroke, vascular dementia, lengthy hospitalization. Patient currently at SNF, receiving therapy. Continue PT/OT as directed. Patient encouraged to be out of bed as tolerated. Monitor for falls/safety.   Follow up Palliative Care Visit: Palliative care will continue to follow for complex medical decision making, advance care planning, and clarification of goals. Return in 6 weeks or prn.  This visit was coded based on medical decision making (MDM).  PPS: 30%  HOSPICE ELIGIBILITY/DIAGNOSIS: TBD  Chief Complaint: Palliative Medicine initial consult.  HISTORY OF PRESENT ILLNESS:  Patricia Dudley is a 88 y.o. year old female  with cerebral infarction, vascular dementia, aphasia, dysphagia, paroxysmal atrial fibrillation-not on anticoagulation, hyperlipidemia hypertensive chronic kidney disease, CKD 4, anemia, type 2 diabetes, glaucoma, neurogenic bladder, vitamin D deficiency, hx of GI bleed.  Patient hospitalized due to acute embolic stroke, CKD, hypernatremia, hyperkalemia, debility.  Patient currently at [facility] and rehab SNF.  Patient requires assistance with all ADLs.  She is out of bed daily to wheelchair.  She is currently receiving PT, OT and ST services.  Staff report fair appetite; she is receiving modified diet due to her dysphagia. Patient received sitting in w/c in dining room. Patient with speech low in tone; she is able to answer direct questions. She denies pain, shortness of breath, nausea, constipation.   History  obtained from review of EMR, discussion with primary team, and interview with family, facility staff/caregiver and/or Patricia Dudley.  I reviewed available labs, medications, imaging, studies and related documents from the EMR.  Records reviewed and summarized above.   ROS  10  point ROS is negative, except for details per HPI.  Physical Exam: Weight: 120.9 pounds Pulse 68, resp 16, sats 97% on room air Constitutional: NAD General: frail appearing, thin  EYES: anicteric sclera, lids intact, no discharge  ENMT: intact hearing, oral mucous membranes moist CV: S1S2, RRR, no LE edema Pulmonary: LCTA, no increased work of breathing, no cough, room air Abdomen: normo-active BS + 4 quadrants, soft and non tender, no ascites GU: deferred MSK: sarcopenia, non- ambulatory Skin: warm and dry, no rashes or wounds on visible skin Neuro: + generalized weakness, A & O to person Psych: non-anxious affect Hem/lymph/immuno: no widespread bruising CURRENT PROBLEM LIST:  Patient Active Problem List   Diagnosis Date Noted   Hypernatremia    Hyperkalemia    Acute embolic stroke ([place]) 35/32/9924   Malnutrition of moderate degree 12/27/2021   PNA (pneumonia) 12/24/2021   Cerebral thrombosis with cerebral infarction 12/24/2021   Anemia, unspecified 06/13/2017   Diabetic nephropathy ([place]) 06/13/2017   GERD (gastroesophageal reflux disease) 06/13/2017   Hyperlipidemia 06/13/2017   Hypertension 06/13/2017   Osteoporosis 06/13/2017   Acute posthemorrhagic anemia    Gastritis without bleeding    Angiodysplasia of stomach and duodenum with hemorrhage    Acute GI bleeding 02/27/2017   CVA (cerebral vascular accident) ([place]) 12/20/2016   Hypertensive urgency 12/19/2016   GI bleed 08/07/2016   Type 2 diabetes mellitus with stage 3 chronic kidney disease, without long-term current use of insulin ([place]) 11/01/2015   Chronic a-fib ([place]) 09/01/2015   Malignant essential hypertension 07/26/2015   Chronic kidney disease 07/26/2015   TIA (transient ischemic attack) 07/26/2015   Vertigo 07/26/2015   Acquired hypothyroidism 07/04/2015   PAST MEDICAL HISTORY:  Active Ambulatory Problems    Diagnosis Date Noted   Malignant essential hypertension 07/26/2015   Chronic kidney disease  07/26/2015   TIA (transient ischemic attack) 07/26/2015   Vertigo 07/26/2015   GI bleed 08/07/2016   Hypertensive urgency 12/19/2016   CVA (cerebral vascular accident) ([place]) 12/20/2016   Acute GI bleeding 02/27/2017   Acute posthemorrhagic anemia    Gastritis without bleeding    Angiodysplasia of stomach and duodenum with hemorrhage    Acquired hypothyroidism 07/04/2015   Anemia, unspecified 06/13/2017   Chronic a-fib ([place]) 09/01/2015   Diabetic nephropathy ([place]) 06/13/2017   GERD (gastroesophageal reflux disease) 06/13/2017   Hyperlipidemia 06/13/2017   Hypertension 06/13/2017   Osteoporosis 06/13/2017   Type 2 diabetes mellitus with stage 3 chronic kidney disease, without long-term current use of insulin ([place]) 11/01/2015   PNA (pneumonia) 12/24/2021   Cerebral thrombosis with cerebral infarction 12/24/2021   Malnutrition of moderate degree 26/83/4196   Acute embolic stroke ([place]) 22/29/7989   Hypernatremia    Hyperkalemia    Resolved Ambulatory Problems    Diagnosis Date Noted   No Resolved Ambulatory Problems   Past Medical History:  Diagnosis Date   A-fib ([place])    Dementia ([place])    Diabetes mellitus without complication ([place])    Hyperlipemia    Thyroid disease    SOCIAL HX:  Social History   Tobacco Use   Smoking status: Never   Smokeless tobacco: Never  Substance Use Topics   Alcohol use: No   FAMILY HX:  Family History  Problem Relation Age of Onset   Diabetes Mother    CVA Mother    Hypertension Mother    Diabetes Sister    Diabetes Brother       ALLERGIES:  Allergies  Allergen Reactions   Ezetimibe Other (See Comments)    Unspecified Other reaction(s): Other (see comments) Other reaction(s): Other (See Comments) Unspecified Unspecified   Iodine Rash   Rosuvastatin Rash    Joint pain     PERTINENT MEDICATIONS:  Outpatient Encounter Medications as of 02/15/2022  Medication Sig   acetaminophen (TYLENOL) 325 MG tablet Take 1-2 tablets  (325-650 mg total) by mouth every 4 (four) hours as needed for mild pain.   amLODipine (NORVASC) 5 MG tablet Take 2 tablets (10 mg total) by mouth daily.   aspirin EC 81 MG EC tablet Take 1 tablet (81 mg total) by mouth daily. Swallow whole.   cholecalciferol (VITAMIN D3) 25 MCG (1000 UNIT) tablet Take 1 tablet (1,000 Units total) by mouth daily.   glipiZIDE (GLUCOTROL) 10 MG tablet Take 1 tablet (10 mg total) by mouth daily after supper.   insulin glargine-yfgn (SEMGLEE) 100 UNIT/ML injection Inject 0.17 mLs (17 Units total) into the skin at bedtime.   iron polysaccharides (NIFEREX) 150 MG capsule Take 1 capsule (150 mg total) by mouth 2 (two) times daily. (Patient not taking: Reported on 12/24/2021)   latanoprost (XALATAN) 0.005 % ophthalmic solution Place 1 drop into both eyes at bedtime.   levothyroxine (SYNTHROID) 112 MCG tablet Take 1 tablet (112 mcg total) by mouth as directed. Take 1 tablet Mon-Fri   loratadine (CLARITIN) 10 MG tablet Take 1 tablet (10 mg total) by mouth at bedtime.   magnesium gluconate (MAGONATE) 500 MG tablet Take 0.5 tablets (250 mg total) by mouth at bedtime.   methocarbamol (ROBAXIN) 500 MG tablet Take 1 tablet (500 mg total) by mouth every 6 (six) hours as needed for muscle spasms.   metoprolol tartrate (LOPRESSOR) 25 MG tablet Take 0.5 tablets (12.5 mg total) by mouth 2 (two) times daily.   Multiple Vitamin (MULTIVITAMIN WITH MINERALS) TABS tablet Take 1 tablet by mouth daily.   pantoprazole (PROTONIX) 40 MG tablet Take 1 tablet (40 mg total) by mouth 2 (two) times daily before a meal.   pantoprazole sodium (PROTONIX) 40 mg Place 40 mg into feeding tube daily.   polycarbophil (FIBERCON) 625 MG tablet Take 1 tablet (625 mg total) by mouth daily.   rosuvastatin (CRESTOR) 10 MG tablet Take 1 tablet (10 mg total) by mouth daily.   traZODone (DESYREL) 50 MG tablet Take 0.5-1 tablets (25-50 mg total) by mouth at bedtime as needed for sleep.   vitamin B-12 (CYANOCOBALAMIN)  100 MCG tablet Take 1 tablet (100 mcg total) by mouth daily.   No facility-administered encounter medications on file as of 02/15/2022.   Thank you for the opportunity to participate in the care of Ms. Monie.  The palliative care team will continue to follow. Please call our office at [phone] if we can be of additional assistance.   Ezekiel Slocumb, NP   COVID-19 PATIENT SCREENING TOOL Asked and negative response unless otherwise noted:  Have you had symptoms of covid, tested positive or been in contact with someone with symptoms/positive test in the past 5-10 days? No

## 2022-03-19 ENCOUNTER — Ambulatory Visit: Payer: Medicare PPO | Admitting: Neurology

## 2022-03-19 VITALS — BP 148/82 | HR 73 | Ht 65.0 in | Wt 130.0 lb

## 2022-03-19 DIAGNOSIS — I482 Chronic atrial fibrillation, unspecified: Secondary | ICD-10-CM | POA: Diagnosis not present

## 2022-03-19 DIAGNOSIS — I639 Cerebral infarction, unspecified: Secondary | ICD-10-CM

## 2022-03-19 NOTE — Progress Notes (Signed)
I agree with the above plan 

## 2022-03-19 NOTE — Progress Notes (Signed)
Patient: Patricia Dudley Date of Birth: 12-09-1929  Reason for Visit: Follow up left basal ganglia infarct 12/24/21 History from: Patient, daughters [name] and [place]  Primary Neurologist: Saw Dr. Erlinda Hong   ASSESSMENT AND PLAN 88 y.o. year old female   1.  Stroke, left basal ganglia small infarct secondary to small vessel disease, cannot rule out cardioembolic given history of A-fib not on AC -On aspirin 81 mg daily -Continue home health PT/OT/ST  2.  A-fib, chronic -On aspirin 81 mg daily, no AC due to GI bleeding  3.  Diabetes -A1c 6.5, goal less than 7.0 -Could not tolerate glipizide, now on insulin  4.  Hypertension -Goal less than 130/90 -On amlodipine, metoprolol  5.  Hyperlipidemia -LDL 110, goal less than 70 -Could not tolerate statin  6.  History of stroke, right-sided deficits -MRI of the brain in 2018, April showed 3 small acute infarcts in the left frontal white matter and corpus callosum  7.  Dementia -Sees palliative care -Stable, living in home with family care, PCP to follow  HISTORY OF PRESENT ILLNESS: Today 03/19/22 Patricia Dudley is here today for left basal ganglia small infarct on 12/24/21 presented with aphasia, AMS, lethargy.  Prior history of stroke with right-sided deficits, was essentially bedbound with some wheelchair use.  History of A-fib not on AC due to GI bleed.  During hospitalization concern for aspiration pneumonia due to vomiting upon arrival, treated with Rocephin and azithromycin.  Was admitted to inpatient rehab 5/11-6/8.  Discharged to skilled nursing facility.  Established with palliative care.  Went back to her home with family care on 03/02/22.  Just started outpatient PT/OT/ST.  Has not been able to get back to using walker, in wheelchair today.  She has dementia.  Responds verbally, when she wants to.  Is pleasant, has a good mood.  She has 10 children who help care for her.  Glipizide was stopped, had trouble swallowing, is now on  insulin.  Crestor was stopped due to side effect.  She will be 89 this week.  For BP, on amlodipine, metoprolol, did not yet take medications  -CT head showed no acute abnormality, there was extensive chronic ischemic changes throughout -MRI brain showed left basal ganglia/intracapsular small infarct -MRA unremarkable -Carotid Doppler unremarkable -2D echo EF 60 to 65% -LDL 110 -A1c 6.5 -No antithrombotic prior to admission, now on aspirin 81 mg daily, was not given DAPT due to history of severe GI bleeding -EEG showed moderate diffuse encephalopathy that was nonspecific, no seizures were seen  HISTORY Copied 12/24/21 Dr. Erlinda Hong HPI: Patricia Dudley is a 88 y.o. female with past medical history of HTN, HLD, A fib not on AC, GI bleed, dementia, CVA, hypothyroidism and DM who presents to [facility] ED via EMS for aphasia, left gaze and slurred speech. Code stroke called by EMS. LKW 2000.Dr. Erlinda Hong spoke with daughter via phone since Saturday she has been less interactive she has been deteriorating and today was found slumped over to the left with little verbal output. Patient had vomited prior to arrival to the ED. NIHSS 22. CTH with no acute abnormality.     LKW: 12/23/2021 @ 2000 tpa given?: no, outside window  Premorbid modified Rankin scale (mRS):  4-Needs assistance to walk and tend to bodily needs  REVIEW OF SYSTEMS: Out of a complete 14 system review of symptoms, the patient complains only of the following symptoms, and all other reviewed systems are negative.  See HPI  ALLERGIES:  Allergies  Allergen Reactions   Ezetimibe Other (See Comments)    Unspecified Other reaction(s): Other (see comments) Other reaction(s): Other (See Comments) Unspecified Unspecified   Iodine Rash   Rosuvastatin Rash    Joint pain    HOME MEDICATIONS: Outpatient Medications Prior to Visit  Medication Sig Dispense Refill   acetaminophen (TYLENOL) 325 MG tablet Take 1-2 tablets (325-650 mg total) by mouth every 4  (four) hours as needed for mild pain.     amLODipine (NORVASC) 5 MG tablet Take 2 tablets (10 mg total) by mouth daily. 60 tablet 0   aspirin EC 81 MG EC tablet Take 1 tablet (81 mg total) by mouth daily. Swallow whole. 30 tablet 11   cholecalciferol (VITAMIN D3) 25 MCG (1000 UNIT) tablet Take 1 tablet (1,000 Units total) by mouth daily. 30 tablet 0   glipiZIDE (GLUCOTROL) 10 MG tablet Take 1 tablet (10 mg total) by mouth daily after supper.     insulin glargine-yfgn (SEMGLEE) 100 UNIT/ML injection Inject 0.17 mLs (17 Units total) into the skin at bedtime. 10 mL 11   iron polysaccharides (NIFEREX) 150 MG capsule Take 1 capsule (150 mg total) by mouth 2 (two) times daily. 60 capsule 0   latanoprost (XALATAN) 0.005 % ophthalmic solution Place 1 drop into both eyes at bedtime.     levothyroxine (SYNTHROID) 112 MCG tablet Take 1 tablet (112 mcg total) by mouth as directed. Take 1 tablet Mon-Fri 30 tablet 0   Magnesium Gluconate 27.5 MG TABS Take by mouth.     metoprolol tartrate (LOPRESSOR) 25 MG tablet Take 0.5 tablets (12.5 mg total) by mouth 2 (two) times daily.     Multiple Vitamin (MULTIVITAMIN WITH MINERALS) TABS tablet Take 1 tablet by mouth daily.     pantoprazole (PROTONIX) 40 MG tablet Take 1 tablet (40 mg total) by mouth 2 (two) times daily before a meal. 60 tablet 0   polycarbophil (FIBERCON) 625 MG tablet Take 1 tablet (625 mg total) by mouth daily. 30 tablet 0   vitamin B-12 (CYANOCOBALAMIN) 100 MCG tablet Take 1 tablet (100 mcg total) by mouth daily. 30 tablet 0   loratadine (CLARITIN) 10 MG tablet Take 1 tablet (10 mg total) by mouth at bedtime. 30 tablet 0   magnesium gluconate (MAGONATE) 500 MG tablet Take 0.5 tablets (250 mg total) by mouth at bedtime. 30 tablet 0   methocarbamol (ROBAXIN) 500 MG tablet Take 1 tablet (500 mg total) by mouth every 6 (six) hours as needed for muscle spasms. 60 tablet 0   pantoprazole sodium (PROTONIX) 40 mg Place 40 mg into feeding tube daily.      rosuvastatin (CRESTOR) 10 MG tablet Take 1 tablet (10 mg total) by mouth daily. 30 tablet 0   traZODone (DESYREL) 50 MG tablet Take 0.5-1 tablets (25-50 mg total) by mouth at bedtime as needed for sleep. 30 tablet 0   No facility-administered medications prior to visit.    PAST MEDICAL HISTORY: Past Medical History:  Diagnosis Date   A-fib [facility])    CVA (cerebral vascular accident) ([place])    Dementia ([place])    Diabetes mellitus without complication ([place])    GI bleed    Hyperlipemia    Hypertension    Thyroid disease     PAST SURGICAL HISTORY: Past Surgical History:  Procedure Laterality Date   COLONOSCOPY N/A 05/16/2017   Procedure: COLONOSCOPY;  Surgeon: Lin Landsman, MD;  Location: [facility] ENDOSCOPY;  Service: Gastroenterology;  Laterality: N/A;   ESOPHAGOGASTRODUODENOSCOPY N/A  08/08/2016   Procedure: ESOPHAGOGASTRODUODENOSCOPY (EGD);  Surgeon: [name], MD;  Location: [facility] ENDOSCOPY;  Service: Endoscopy;  Laterality: N/A;   ESOPHAGOGASTRODUODENOSCOPY N/A 05/16/2017   Procedure: ESOPHAGOGASTRODUODENOSCOPY (EGD);  Surgeon: Lin Landsman, MD;  Location: [facility] ENDOSCOPY;  Service: Gastroenterology;  Laterality: N/A;   ESOPHAGOGASTRODUODENOSCOPY (EGD) WITH PROPOFOL N/A 02/28/2017   Procedure: ESOPHAGOGASTRODUODENOSCOPY (EGD) WITH PROPOFOL;  Surgeon: [name], MD;  Location: [facility] ENDOSCOPY;  Service: Endoscopy;  Laterality: N/A;   EYE SURGERY     left    FAMILY HISTORY: Family History  Problem Relation Age of Onset   Diabetes Mother    CVA Mother    Hypertension Mother    Diabetes Sister    Diabetes Brother     SOCIAL HISTORY: Social History   Socioeconomic History   Marital status: Widowed    Spouse name: Not on file   Number of children: Not on file   Years of education: Not on file   Highest education level: Not on file  Occupational History   Occupation: retired  Tobacco Use   Smoking status: Never   Smokeless tobacco: Never  Vaping Use    Vaping Use: Never used  Substance and Sexual Activity   Alcohol use: No   Drug use: No   Sexual activity: Never  Other Topics Concern   Not on file  Social History Narrative   Not on file   Social Determinants of Health   Financial Resource Strain: Not on file  Food Insecurity: Not on file  Transportation Needs: Not on file  Physical Activity: Not on file  Stress: Not on file  Social Connections: Not on file  Intimate Partner Violence: Not on file    PHYSICAL EXAM  Vitals:   03/19/22 0959  BP: (!) 148/82  Pulse: 73  Weight: 130 lb (59 kg)  Height: 5\' 5"  (1.651 m)   Body mass index is 21.63 kg/m.  Generalized: Well developed, in no acute distress  Cardiovascular: Irregular rhythm Neurological examination  Mentation: Alert, follows most exam commands, has limited verbal response, but what was heard is clear, relies on her daughter's for history, smiling, pleasant, seems mild left facial droop Cranial nerve II-XII: Pupils were equal round reactive to light, right eye is open wider than left,  trouble following EOM commands, Facial sensation and strength were normal.   Motor: Generalized weakness, lack of effort, slightly weaker  more to the right arm and leg Sensory: Responds to touch to all extremities Coordination: Trouble following exam commands Gait and station: Was not ambulated today, in a wheelchair Reflexes: Deep tendon reflexes are symmetric but decreased throughout.   DIAGNOSTIC DATA (LABS, IMAGING, TESTING) - I reviewed patient records, labs, notes, testing and imaging myself where available.  Lab Results  Component Value Date   WBC 6.6 01/28/2022   HGB 8.3 (L) 01/28/2022   HCT 24.9 (L) 01/28/2022   MCV 87.7 01/28/2022   PLT 254 01/28/2022      Component Value Date/Time   NA 140 01/31/2022 0544   K 4.4 01/31/2022 0544   CL 109 01/31/2022 0544   CO2 22 01/31/2022 0544   GLUCOSE 58 (L) 01/31/2022 0544   BUN 52 (H) 01/31/2022 0544   CREATININE 2.84  (H) 01/31/2022 0544   CALCIUM 8.2 (L) 01/31/2022 0544   PROT 6.1 (L) 01/04/2022 0558   ALBUMIN 2.6 (L) 01/24/2022 0525   AST 30 01/04/2022 0558   ALT 36 01/04/2022 0558   ALKPHOS 78 01/04/2022 0558   BILITOT  0.8 01/04/2022 0558   GFRNONAA 15 (L) 01/31/2022 0544   GFRAA 31 (L) 05/05/2020 1353   Lab Results  Component Value Date   CHOL 185 12/25/2021   HDL 52 12/25/2021   LDLCALC 110 (H) 12/25/2021   TRIG 117 12/25/2021   CHOLHDL 3.6 12/25/2021   Lab Results  Component Value Date   HGBA1C 6.5 (H) 12/25/2021   Lab Results  Component Value Date   VXYIAXKP53 748 06/13/2017   Lab Results  Component Value Date   TSH 0.847 08/08/2016    Butler Denmark, AGNP-C, DNP 03/19/2022, 10:58 AM [facility] [address], [place], [place] [address] [phone]

## 2022-03-19 NOTE — Patient Instructions (Signed)
Great to see you today  Continue to see your primary care doctor Work on keeping BP < 130/90 See you back as needed  Go to the ER for any acute symptoms

## 2022-04-04 ENCOUNTER — Other Ambulatory Visit: Payer: Medicare PPO | Admitting: Student

## 2022-04-04 DIAGNOSIS — R1310 Dysphagia, unspecified: Secondary | ICD-10-CM

## 2022-04-04 DIAGNOSIS — I6930 Unspecified sequelae of cerebral infarction: Secondary | ICD-10-CM

## 2022-04-04 DIAGNOSIS — R531 Weakness: Secondary | ICD-10-CM

## 2022-04-04 DIAGNOSIS — E1122 Type 2 diabetes mellitus with diabetic chronic kidney disease: Secondary | ICD-10-CM

## 2022-04-04 DIAGNOSIS — Z515 Encounter for palliative care: Secondary | ICD-10-CM

## 2022-04-04 NOTE — Progress Notes (Signed)
[facility] Palliative Care Consult Note Telephone: [phone]  Fax: [phone]    Date of encounter: 04/04/22 3:15 PM PATIENT NAME: Patricia Dudley [address] [place] [address]   [phone] (home)  DOB: 11-18-1929 MRN: [id number] PRIMARY CARE PROVIDER:    Tracie Harrier, MD,  [address] [place] [address] [place] PROVIDER:   Tracie Harrier, [place],  [place] [address] [phone]  RESPONSIBLE PARTY:    Contact Information     Name Relation Home Work Mobile   [place] Daughter [phone]     [facility] Daughter   [phone]   Rachel Moulds Daughter [phone]     Devoria Albe Daughter [phone]          I met face to face with patient and family in the home. Palliative Care was asked to follow this patient by consultation request of  Tracie Harrier, MD to address advance care planning and complex medical decision making. This is a follow up visit.                                   ASSESSMENT AND PLAN / RECOMMENDATIONS:   Advance Care Planning/Goals of Care: Goals include to maximize quality of life and symptom management. Patient/health care surrogate gave his/her permission to discuss. CODE STATUS: DNR  Education provided on Palliative Medicine. Patient to remain in the home with supportive care from family; continue therapy as directed.   Symptom Management/Plan:  Generalized weakness-secondary to vascular dementia, cerebral infarction. Patient continues to receive therapy. They are awaiting new mechanical lift to be delivered. Family to continue providing supportive care.  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 She does have dysphagia; continues on pureed diet, NTL. She is able to feed herself some items. Aspiration precautions are in place. Family  to continue supportive care, ST/PT/OT as directed.  T2DM-patient's AM blood sugars have been 60-80's mg/dL. Continue to check blood sugar each morning. Currently receiving Lantus 15 units. Recommend HS snack to prevent hypoglycemia.    Follow up Palliative Care Visit: Palliative care will continue to follow for complex medical decision making, advance care planning, and clarification of goals. Return in 6 weeks or prn.   This visit was coded based on medical decision making (MDM).  PPS: 30%  HOSPICE ELIGIBILITY/DIAGNOSIS: TBD  Chief Complaint: Palliative Medicine follow up visit.   HISTORY OF PRESENT ILLNESS:  Patricia Dudley is a 88 y.o. year old female  with cerebral infarction, vascular dementia, aphasia, dysphagia, paroxysmal atrial fibrillation-not on anticoagulation, hyperlipidemia hypertensive chronic kidney disease, CKD 4, anemia, type 2 diabetes, glaucoma, neurogenic bladder, vitamin D deficiency, hx of GI bleed.   Patient denies pain, shortness of breath. She is out of bed with assistance of therapy. They are awaiting delivery of another mechanical lift. Patient is eating well per family; continue on pureed diet. No worsening dysphagia. No skin breakdown.  Blood sugars between 60-80's. Currently receiving 15 units of Lantus. No other medication changes reported. Patient received resting in bed. Pleasant affect. She is able to answer direct questions, follow simple commands.   History obtained from review of EMR, discussion with primary team, and interview with family, facility staff/caregiver and/or Ms. Dettinger.  I reviewed available labs, medications, imaging, studies and related documents from the EMR.  Records reviewed and summarized above.  Physical Exam:  Pulse 76, resp 20, b/p 154/76, sats 96% on room air Constitutional: NAD General: frail appearing, thin  EYES: anicteric sclera, lids intact, no discharge  ENMT: intact hearing, oral mucous membranes moist, dentition  intact CV: S1S2, RRR, no LE edema Pulmonary: LCTA, no increased work of breathing, no cough, room air Abdomen: normo-active BS + 4 quadrants, soft and non tender, no ascites GU: deferred MSK: moves all extremities, non-ambulatory Skin: warm and dry, no rashes or wounds on visible skin Neuro:  + generalized weakness,  A & O to person, place, familiars Psych: non-anxious affect Hem/lymph/immuno: no widespread bruising   Thank you for the opportunity to participate in the care of Patricia Dudley.  The palliative care team will continue to follow. Please call our office at [phone] if we can be of additional assistance.   Ezekiel Slocumb, NP   COVID-19 PATIENT SCREENING TOOL Asked and negative response unless otherwise noted:   Have you had symptoms of covid, tested positive or been in contact with someone with symptoms/positive test in the past 5-10 days? No

## 2022-04-30 ENCOUNTER — Encounter: Payer: Medicare PPO | Admitting: Physical Medicine and Rehabilitation

## 2022-05-10 ENCOUNTER — Other Ambulatory Visit: Payer: Self-pay

## 2022-05-10 ENCOUNTER — Emergency Department (HOSPITAL_COMMUNITY): Payer: Medicare PPO

## 2022-05-10 ENCOUNTER — Encounter (HOSPITAL_COMMUNITY): Payer: Self-pay

## 2022-05-10 ENCOUNTER — Inpatient Hospital Stay (HOSPITAL_COMMUNITY)
Admission: EM | Admit: 2022-05-10 | Discharge: 2022-05-14 | DRG: 871 | Disposition: A | Discharge status: Hospice | Payer: Medicare PPO | Attending: Family Medicine | Admitting: Family Medicine

## 2022-05-10 DIAGNOSIS — I10 Essential (primary) hypertension: Secondary | ICD-10-CM | POA: Diagnosis present

## 2022-05-10 DIAGNOSIS — Z833 Family history of diabetes mellitus: Secondary | ICD-10-CM

## 2022-05-10 DIAGNOSIS — E860 Dehydration: Secondary | ICD-10-CM | POA: Diagnosis present

## 2022-05-10 DIAGNOSIS — Z79899 Other long term (current) drug therapy: Secondary | ICD-10-CM

## 2022-05-10 DIAGNOSIS — Z794 Long term (current) use of insulin: Secondary | ICD-10-CM

## 2022-05-10 DIAGNOSIS — E1122 Type 2 diabetes mellitus with diabetic chronic kidney disease: Secondary | ICD-10-CM | POA: Diagnosis present

## 2022-05-10 DIAGNOSIS — A4181 Sepsis due to Enterococcus: Secondary | ICD-10-CM | POA: Diagnosis not present

## 2022-05-10 DIAGNOSIS — I69351 Hemiplegia and hemiparesis following cerebral infarction affecting right dominant side: Secondary | ICD-10-CM

## 2022-05-10 DIAGNOSIS — I480 Paroxysmal atrial fibrillation: Secondary | ICD-10-CM | POA: Diagnosis present

## 2022-05-10 DIAGNOSIS — I69391 Dysphagia following cerebral infarction: Secondary | ICD-10-CM

## 2022-05-10 DIAGNOSIS — E119 Type 2 diabetes mellitus without complications: Secondary | ICD-10-CM

## 2022-05-10 DIAGNOSIS — Z7902 Long term (current) use of antithrombotics/antiplatelets: Secondary | ICD-10-CM

## 2022-05-10 DIAGNOSIS — N184 Chronic kidney disease, stage 4 (severe): Secondary | ICD-10-CM | POA: Diagnosis present

## 2022-05-10 DIAGNOSIS — Z7401 Bed confinement status: Secondary | ICD-10-CM

## 2022-05-10 DIAGNOSIS — E871 Hypo-osmolality and hyponatremia: Secondary | ICD-10-CM | POA: Diagnosis present

## 2022-05-10 DIAGNOSIS — R413 Other amnesia: Secondary | ICD-10-CM

## 2022-05-10 DIAGNOSIS — Z7984 Long term (current) use of oral hypoglycemic drugs: Secondary | ICD-10-CM

## 2022-05-10 DIAGNOSIS — Z79890 Hormone replacement therapy (postmenopausal): Secondary | ICD-10-CM

## 2022-05-10 DIAGNOSIS — Z888 Allergy status to other drugs, medicaments and biological substances status: Secondary | ICD-10-CM

## 2022-05-10 DIAGNOSIS — N183 Chronic kidney disease, stage 3 unspecified: Secondary | ICD-10-CM

## 2022-05-10 DIAGNOSIS — G9341 Metabolic encephalopathy: Secondary | ICD-10-CM | POA: Diagnosis present

## 2022-05-10 DIAGNOSIS — Z823 Family history of stroke: Secondary | ICD-10-CM

## 2022-05-10 DIAGNOSIS — N179 Acute kidney failure, unspecified: Secondary | ICD-10-CM | POA: Diagnosis not present

## 2022-05-10 DIAGNOSIS — R1310 Dysphagia, unspecified: Secondary | ICD-10-CM

## 2022-05-10 DIAGNOSIS — N3000 Acute cystitis without hematuria: Secondary | ICD-10-CM

## 2022-05-10 DIAGNOSIS — K31819 Angiodysplasia of stomach and duodenum without bleeding: Secondary | ICD-10-CM | POA: Diagnosis present

## 2022-05-10 DIAGNOSIS — R4182 Altered mental status, unspecified: Secondary | ICD-10-CM | POA: Diagnosis not present

## 2022-05-10 DIAGNOSIS — G9340 Encephalopathy, unspecified: Secondary | ICD-10-CM | POA: Diagnosis not present

## 2022-05-10 DIAGNOSIS — I482 Chronic atrial fibrillation, unspecified: Secondary | ICD-10-CM | POA: Diagnosis present

## 2022-05-10 DIAGNOSIS — E870 Hyperosmolality and hypernatremia: Secondary | ICD-10-CM | POA: Diagnosis present

## 2022-05-10 DIAGNOSIS — Z8249 Family history of ischemic heart disease and other diseases of the circulatory system: Secondary | ICD-10-CM

## 2022-05-10 DIAGNOSIS — R627 Adult failure to thrive: Secondary | ICD-10-CM | POA: Diagnosis present

## 2022-05-10 DIAGNOSIS — E039 Hypothyroidism, unspecified: Secondary | ICD-10-CM | POA: Diagnosis present

## 2022-05-10 DIAGNOSIS — I129 Hypertensive chronic kidney disease with stage 1 through stage 4 chronic kidney disease, or unspecified chronic kidney disease: Secondary | ICD-10-CM | POA: Diagnosis present

## 2022-05-10 DIAGNOSIS — R6520 Severe sepsis without septic shock: Secondary | ICD-10-CM | POA: Diagnosis present

## 2022-05-10 DIAGNOSIS — Z66 Do not resuscitate: Secondary | ICD-10-CM | POA: Diagnosis present

## 2022-05-10 DIAGNOSIS — A419 Sepsis, unspecified organism: Secondary | ICD-10-CM

## 2022-05-10 DIAGNOSIS — E785 Hyperlipidemia, unspecified: Secondary | ICD-10-CM | POA: Diagnosis present

## 2022-05-10 DIAGNOSIS — K219 Gastro-esophageal reflux disease without esophagitis: Secondary | ICD-10-CM | POA: Diagnosis present

## 2022-05-10 DIAGNOSIS — F0390 Unspecified dementia without behavioral disturbance: Secondary | ICD-10-CM | POA: Diagnosis present

## 2022-05-10 LAB — CBC WITH DIFFERENTIAL/PLATELET
Abs Immature Granulocytes: 0.06 10*3/uL (ref 0.00–0.07)
Basophils Absolute: 0.1 10*3/uL (ref 0.0–0.1)
Basophils Relative: 0 %
Eosinophils Absolute: 0.1 10*3/uL (ref 0.0–0.5)
Eosinophils Relative: 1 %
HCT: 44.4 % (ref 36.0–46.0)
Hemoglobin: 13.6 g/dL (ref 12.0–15.0)
Immature Granulocytes: 0 %
Lymphocytes Relative: 13 %
Lymphs Abs: 2 10*3/uL (ref 0.7–4.0)
MCH: 26.3 pg (ref 26.0–34.0)
MCHC: 30.6 g/dL (ref 30.0–36.0)
MCV: 85.7 fL (ref 80.0–100.0)
Monocytes Absolute: 1.1 10*3/uL — ABNORMAL HIGH (ref 0.1–1.0)
Monocytes Relative: 7 %
Neutro Abs: 12.6 10*3/uL — ABNORMAL HIGH (ref 1.7–7.7)
Neutrophils Relative %: 79 %
Platelets: 320 10*3/uL (ref 150–400)
RBC: 5.18 MIL/uL — ABNORMAL HIGH (ref 3.87–5.11)
RDW: 15.5 % (ref 11.5–15.5)
WBC: 15.9 10*3/uL — ABNORMAL HIGH (ref 4.0–10.5)
nRBC: 0 % (ref 0.0–0.2)

## 2022-05-10 LAB — BASIC METABOLIC PANEL
Anion gap: 14 (ref 5–15)
BUN: 44 mg/dL — ABNORMAL HIGH (ref 8–23)
CO2: 21 mmol/L — ABNORMAL LOW (ref 22–32)
Calcium: 8.5 mg/dL — ABNORMAL LOW (ref 8.9–10.3)
Chloride: 116 mmol/L — ABNORMAL HIGH (ref 98–111)
Creatinine, Ser: 2.56 mg/dL — ABNORMAL HIGH (ref 0.44–1.00)
GFR, Estimated: 17 mL/min — ABNORMAL LOW (ref >60)
Glucose, Bld: 174 mg/dL — ABNORMAL HIGH (ref 70–99)
Potassium: 4.2 mmol/L (ref 3.5–5.1)
Sodium: 151 mmol/L — ABNORMAL HIGH (ref 135–145)

## 2022-05-10 LAB — COMPREHENSIVE METABOLIC PANEL
ALT: 12 U/L (ref 0–44)
AST: 22 U/L (ref 15–41)
Albumin: 3.2 g/dL — ABNORMAL LOW (ref 3.5–5.0)
Alkaline Phosphatase: 70 U/L (ref 38–126)
Anion gap: 14 (ref 5–15)
BUN: 49 mg/dL — ABNORMAL HIGH (ref 8–23)
CO2: 23 mmol/L (ref 22–32)
Calcium: 9.2 mg/dL (ref 8.9–10.3)
Chloride: 116 mmol/L — ABNORMAL HIGH (ref 98–111)
Creatinine, Ser: 2.79 mg/dL — ABNORMAL HIGH (ref 0.44–1.00)
GFR, Estimated: 16 mL/min — ABNORMAL LOW (ref >60)
Glucose, Bld: 143 mg/dL — ABNORMAL HIGH (ref 70–99)
Potassium: 4.9 mmol/L (ref 3.5–5.1)
Sodium: 153 mmol/L — ABNORMAL HIGH (ref 135–145)
Total Bilirubin: 1.1 mg/dL (ref 0.3–1.2)
Total Protein: 7.7 g/dL (ref 6.5–8.1)

## 2022-05-10 LAB — URINALYSIS, ROUTINE W REFLEX MICROSCOPIC
Bacteria, UA: NONE SEEN
Bilirubin Urine: NEGATIVE
Glucose, UA: NEGATIVE mg/dL
Hgb urine dipstick: NEGATIVE
Ketones, ur: 5 mg/dL — AB
Nitrite: NEGATIVE
Protein, ur: 100 mg/dL — AB
Specific Gravity, Urine: 1.012 (ref 1.005–1.030)
pH: 7 (ref 5.0–8.0)

## 2022-05-10 LAB — LACTIC ACID, PLASMA
Lactic Acid, Venous: 1.9 mmol/L (ref 0.5–1.9)
Lactic Acid, Venous: 1.1 mmol/L (ref 0.5–1.9)

## 2022-05-10 LAB — VITAMIN B12: Vitamin B-12: 1257 pg/mL — ABNORMAL HIGH (ref 180–914)

## 2022-05-10 LAB — TSH: TSH: 0.02 u[IU]/mL — ABNORMAL LOW (ref 0.350–4.500)

## 2022-05-10 LAB — CBG MONITORING, ED: Glucose-Capillary: 156 mg/dL — ABNORMAL HIGH (ref 70–99)

## 2022-05-10 LAB — GLUCOSE, CAPILLARY: Glucose-Capillary: 151 mg/dL — ABNORMAL HIGH (ref 70–99)

## 2022-05-10 LAB — PROTIME-INR
INR: 1.2 (ref 0.8–1.2)
Prothrombin Time: 15 seconds (ref 11.4–15.2)

## 2022-05-10 LAB — AMMONIA: Ammonia: 27 umol/L (ref 9–35)

## 2022-05-10 MED ORDER — LEVOTHYROXINE SODIUM 112 MCG PO TABS
112.0000 ug | ORAL_TABLET | Freq: Every day | ORAL | Status: DC
  Administered 2022-05-11 – 2022-05-14 (×4): 112 ug via ORAL
  Filled 2022-05-10 (×5): qty 1

## 2022-05-10 MED ORDER — AMLODIPINE BESYLATE 5 MG PO TABS
5.0000 mg | ORAL_TABLET | Freq: Every day | ORAL | Status: DC
  Administered 2022-05-11 – 2022-05-14 (×4): 5 mg via ORAL
  Filled 2022-05-10 (×5): qty 1

## 2022-05-10 MED ORDER — INSULIN GLARGINE-YFGN 100 UNIT/ML ~~LOC~~ SOLN
8.0000 [IU] | Freq: Every day | SUBCUTANEOUS | Status: DC
  Filled 2022-05-10: qty 0.08

## 2022-05-10 MED ORDER — SODIUM CHLORIDE 0.9 % IV SOLN
2.0000 g | Freq: Once | INTRAVENOUS | Status: AC
  Administered 2022-05-10: 2 g via INTRAVENOUS
  Filled 2022-05-10: qty 20

## 2022-05-10 MED ORDER — METOPROLOL TARTRATE 12.5 MG HALF TABLET
12.5000 mg | ORAL_TABLET | Freq: Two times a day (BID) | ORAL | Status: DC
  Administered 2022-05-11 – 2022-05-14 (×8): 12.5 mg via ORAL
  Filled 2022-05-10 (×8): qty 1

## 2022-05-10 MED ORDER — SODIUM CHLORIDE 0.9 % IV SOLN
2.0000 g | INTRAVENOUS | Status: DC
  Administered 2022-05-11: 2 g via INTRAVENOUS
  Filled 2022-05-10: qty 20

## 2022-05-10 MED ORDER — METOPROLOL TARTRATE 5 MG/5ML IV SOLN: 5.0000 mg | Freq: Three times a day (TID) | INTRAVENOUS | Status: DC

## 2022-05-10 MED ORDER — INSULIN ASPART 100 UNIT/ML IJ SOLN
0.0000 [IU] | Freq: Three times a day (TID) | INTRAMUSCULAR | Status: DC
  Administered 2022-05-10: 2 [IU] via SUBCUTANEOUS
  Administered 2022-05-11: 1 [IU] via SUBCUTANEOUS
  Administered 2022-05-11: 5 [IU] via SUBCUTANEOUS
  Administered 2022-05-11: 1 [IU] via SUBCUTANEOUS
  Administered 2022-05-12 (×2): 3 [IU] via SUBCUTANEOUS
  Administered 2022-05-12: 2 [IU] via SUBCUTANEOUS
  Administered 2022-05-13: 3 [IU] via SUBCUTANEOUS
  Administered 2022-05-13: 5 [IU] via SUBCUTANEOUS
  Administered 2022-05-13 – 2022-05-14 (×2): 3 [IU] via SUBCUTANEOUS

## 2022-05-10 MED ORDER — ACETAMINOPHEN 325 MG PO TABS
650.0000 mg | ORAL_TABLET | Freq: Four times a day (QID) | ORAL | Status: DC | PRN
  Administered 2022-05-13: 650 mg via ORAL
  Filled 2022-05-10: qty 2

## 2022-05-10 MED ORDER — ASPIRIN 81 MG PO TBEC: 81.0000 mg | DELAYED_RELEASE_TABLET | Freq: Every day | ORAL | Status: DC

## 2022-05-10 MED ORDER — ACETAMINOPHEN 650 MG RE SUPP: 650.0000 mg | Freq: Four times a day (QID) | RECTAL | Status: DC | PRN

## 2022-05-10 MED ORDER — PANTOPRAZOLE 2 MG/ML SUSPENSION
40.0000 mg | Freq: Every day | ORAL | Status: DC
  Administered 2022-05-11 – 2022-05-14 (×4): 40 mg via ORAL
  Filled 2022-05-10 (×5): qty 20

## 2022-05-10 MED ORDER — METOPROLOL TARTRATE 5 MG/5ML IV SOLN: 5.0000 mg | Freq: Three times a day (TID) | INTRAVENOUS | Status: DC | PRN

## 2022-05-10 MED ORDER — SODIUM CHLORIDE 0.9 % IV SOLN: INTRAVENOUS | Status: DC

## 2022-05-10 MED ORDER — SODIUM CHLORIDE 0.9 % IV BOLUS
1000.0000 mL | Freq: Once | INTRAVENOUS | Status: AC
  Administered 2022-05-10: 1000 mL via INTRAVENOUS

## 2022-05-10 MED ORDER — ASPIRIN 81 MG PO CHEW
81.0000 mg | CHEWABLE_TABLET | Freq: Every day | ORAL | Status: DC
  Administered 2022-05-11 – 2022-05-14 (×4): 81 mg via ORAL
  Filled 2022-05-10 (×5): qty 1

## 2022-05-10 MED ORDER — PANTOPRAZOLE SODIUM 40 MG PO TBEC: 40.0000 mg | DELAYED_RELEASE_TABLET | Freq: Two times a day (BID) | ORAL | Status: DC

## 2022-05-10 MED ORDER — LATANOPROST 0.005 % OP SOLN
1.0000 [drp] | Freq: Every day | OPHTHALMIC | Status: DC
  Administered 2022-05-11 – 2022-05-13 (×4): 1 [drp] via OPHTHALMIC
  Filled 2022-05-10: qty 2.5

## 2022-05-10 NOTE — Assessment & Plan Note (Signed)
Head CT negative for acute finding No focal deficits Continue crestor/ASA

## 2022-05-10 NOTE — Assessment & Plan Note (Signed)
delerium precautions  

## 2022-05-10 NOTE — ED Triage Notes (Signed)
Pt BIB [facility] from home after family called for AMS. EMS reports pt is tachy, febrile, and hx of pneumonia. Hx of stroke, non verbal at baseline. 20 R hand, 500 mL en route.

## 2022-05-10 NOTE — ED Notes (Signed)
Patient transported to CT 

## 2022-05-10 NOTE — ED Provider Notes (Signed)
[facility] EMERGENCY DEPARTMENT Provider Note   CSN: [id number] Arrival date & time: 05/10/22  1141     History {Add pertinent medical, surgical, social history, OB history to HPI:1} Chief Complaint  Patient presents with   Altered Mental Status    Patricia Dudley is a 88 y.o. female.  Patient has a history of hypertension and dementia and a stroke.  According to her daughter she has been more lethargic lately.  She talks occasionally.   Altered Mental Status      Home Medications Prior to Admission medications   Medication Sig Start Date End Date Taking? Authorizing Provider  acetaminophen (TYLENOL) 325 MG tablet Take 1-2 tablets (325-650 mg total) by mouth every 4 (four) hours as needed for mild pain. 01/29/22   Angiulli, Lavon Paganini, PA-C  amLODipine (NORVASC) 5 MG tablet Take 2 tablets (10 mg total) by mouth daily. 01/29/22   Angiulli, Lavon Paganini, PA-C  aspirin EC 81 MG EC tablet Take 1 tablet (81 mg total) by mouth daily. Swallow whole. 01/03/22   Ghimire, Henreitta Leber, MD  cholecalciferol (VITAMIN D3) 25 MCG (1000 UNIT) tablet Take 1 tablet (1,000 Units total) by mouth daily. 01/29/22   Angiulli, Lavon Paganini, PA-C  glipiZIDE (GLUCOTROL) 10 MG tablet Take 1 tablet (10 mg total) by mouth daily after supper. 01/29/22   Angiulli, Lavon Paganini, PA-C  insulin glargine-yfgn (SEMGLEE) 100 UNIT/ML injection Inject 0.17 mLs (17 Units total) into the skin at bedtime. 01/29/22   Angiulli, Lavon Paganini, PA-C  iron polysaccharides (NIFEREX) 150 MG capsule Take 1 capsule (150 mg total) by mouth 2 (two) times daily. 05/16/17   [name], MD  latanoprost (XALATAN) 0.005 % ophthalmic solution Place 1 drop into both eyes at bedtime.    [provider]  levothyroxine (SYNTHROID) 112 MCG tablet Take 1 tablet (112 mcg total) by mouth as directed. Take 1 tablet Mon-Fri 01/29/22   Angiulli, Lavon Paganini, PA-C  Magnesium Gluconate 27.5 MG TABS Take by mouth. 01/29/22   [provider]   metoprolol tartrate (LOPRESSOR) 25 MG tablet Take 0.5 tablets (12.5 mg total) by mouth 2 (two) times daily. 01/31/22   Angiulli, Lavon Paganini, PA-C  Multiple Vitamin (MULTIVITAMIN WITH MINERALS) TABS tablet Take 1 tablet by mouth daily. 01/29/22   Angiulli, Lavon Paganini, PA-C  pantoprazole (PROTONIX) 40 MG tablet Take 1 tablet (40 mg total) by mouth 2 (two) times daily before a meal. 01/29/22   Angiulli, Lavon Paganini, PA-C  polycarbophil (FIBERCON) 625 MG tablet Take 1 tablet (625 mg total) by mouth daily. 01/29/22   Angiulli, Lavon Paganini, PA-C  vitamin B-12 (CYANOCOBALAMIN) 100 MCG tablet Take 1 tablet (100 mcg total) by mouth daily. 01/29/22   Angiulli, Lavon Paganini, PA-C      Allergies    Ezetimibe, Iodine, and Rosuvastatin    Review of Systems   Review of Systems  Physical Exam Updated Vital Signs BP (!) 154/71   Pulse (!) 55   Temp 98.1 F (36.7 C) (Oral)   Resp (!) 27   SpO2 97%  Physical Exam  ED Results / Procedures / Treatments   Labs (all labs ordered are listed, but only abnormal results are displayed) Labs Reviewed  COMPREHENSIVE METABOLIC PANEL - Abnormal; Notable for the following components:      Result Value   Sodium 153 (*)    Chloride 116 (*)    Glucose, Bld 143 (*)    BUN 49 (*)    Creatinine, Ser 2.79 (*)  Albumin 3.2 (*)    GFR, Estimated 16 (*)    All other components within normal limits  CBC WITH DIFFERENTIAL/PLATELET - Abnormal; Notable for the following components:   WBC 15.9 (*)    RBC 5.18 (*)    Neutro Abs 12.6 (*)    Monocytes Absolute 1.1 (*)    All other components within normal limits  URINALYSIS, ROUTINE W REFLEX MICROSCOPIC - Abnormal; Notable for the following components:   Ketones, ur 5 (*)    Protein, ur 100 (*)    Leukocytes,Ua LARGE (*)    All other components within normal limits  CULTURE, BLOOD (ROUTINE X 2)  CULTURE, BLOOD (ROUTINE X 2)  URINE CULTURE  LACTIC ACID, PLASMA  LACTIC ACID, PLASMA  PROTIME-INR  TSH  VITAMIN B12  AMMONIA     EKG None  Radiology CT Head Wo Contrast  Result Date: 05/10/2022 CLINICAL DATA:  Dizziness.  Altered mental status EXAM: CT HEAD WITHOUT CONTRAST TECHNIQUE: Contiguous axial images were obtained from the base of the skull through the vertex without intravenous contrast. RADIATION DOSE REDUCTION: This exam was performed according to the departmental dose-optimization program which includes automated exposure control, adjustment of the mA and/or kV according to patient size and/or use of iterative reconstruction technique. COMPARISON:  CT head 12/24/2021 FINDINGS: Brain: Generalized atrophy. Negative for hydrocephalus. Extensive white matter hypodensity bilaterally. Small chronic cortical infarct right frontal parietal lobe. Chronic infarct right occipital lobe. Small chronic infarcts in the cerebellum bilaterally. Negative for acute infarct, hemorrhage, mass Vascular: Negative for hyperdense vessel Skull: Negative Sinuses/Orbits: Mucosal edema in bony thickening left sphenoid sinus. Small air-fluid level right sphenoid sinus. Bilateral cataract extraction Other: None IMPRESSION: Generalized atrophy with extensive chronic ischemia No acute abnormality. Electronically Signed   By: Franchot Gallo M.D.   On: 05/10/2022 13:42   DG Chest Portable 1 View  Result Date: 05/10/2022 CLINICAL DATA:  Pneumonia. EXAM: PORTABLE CHEST 1 VIEW COMPARISON:  Dec 26, 2021. FINDINGS: The heart size and mediastinal contours are within normal limits. Both lungs are clear. The visualized skeletal structures are unremarkable. IMPRESSION: No active disease. Electronically Signed   By: Marijo Conception M.D.   On: 05/10/2022 12:20    Procedures Procedures  {Document cardiac monitor, telemetry assessment procedure when appropriate:1}  Medications Ordered in ED Medications  cefTRIAXone (ROCEPHIN) 2 g in sodium chloride 0.9 % 100 mL IVPB (2 g Intravenous New Bag/Given 05/10/22 1441)  sodium chloride 0.9 % bolus 1,000 mL (1,000  mLs Intravenous New Bag/Given 05/10/22 1353)    ED Course/ Medical Decision Making/ A&P                           Medical Decision Making Amount and/or Complexity of Data Reviewed Labs: ordered. Radiology: ordered.  Risk Decision regarding hospitalization.   Patient with hyponatremia, dehydration and UTI she will be admitted to medicine  {Document critical care time when appropriate:1} {Document review of labs and clinical decision tools ie heart score, Chads2Vasc2 etc:1}  {Document your independent review of radiology images, and any outside records:1} {Document your discussion with family members, caretakers, and with consultants:1} {Document social determinants of health affecting pt's care:1} {Document your decision making why or why not admission, treatments were needed:1} Final Clinical Impression(s) / ED Diagnoses Final diagnoses:  Hypernatremia  Acute cystitis without hematuria    Rx / DC Orders ED Discharge Orders     None

## 2022-05-10 NOTE — Assessment & Plan Note (Addendum)
88 year old presenting with mental status change form her baseline in setting of poor PO intake and less responsiveness over 2-3 weeks. Does have a baseline of dementia, but she is less than responsive than her baseline.  -obs to telemetry -appears to be possible infectious cause as her urine appears infected, continue rocephin for now. Urine culture obtained.  -sepsis criteria met with tachycardia and leukocytosis, but clinically well appearing. Continue IVF, abx  -also dehydrated with ketones in urine and hypernatremia. continue with IVF resuscitation with NS for today -check metabolic labs including ZSM/O70 and ammonia  -CT head with no acute finding and no focal deficits on exam  -protecting airway, hx of dysphagia. Aspiration precautions and SLP eval -palliative care consult in setting of FTT

## 2022-05-10 NOTE — H&P (Signed)
History and Physical    Patient: Patricia Dudley:[id number] DOB: 02-26-1930 DOA: 05/10/2022 DOS: the patient was seen and examined on 05/10/2022 PCP: Tracie Harrier, MD  Patient coming from: Home - lives with her daughter. Mainly bed bound.    Chief Complaint: AMS/FTT  HPI: Patricia Dudley is a 88 y.o. female with medical history significant of PAF not on anticoagulation secondary to known arteriovenous malformation of the duodenum with GI bleed in September 2018,  dementia, chronic kidney disease stage IIIb, hypothyroidism, T2DM, hypertension, glaucoma, hyperlipidemia, hx of CVA in 12/2021 who presented to ED with complaints of AMS. Daughters give history. They state over the last 2-3 weeks she has stopped eating much and has become less responsive than her baseline.  This morning when they went in to wake her up she was hard to wake up, looked like she was not taking deep breaths and her color looked off. Her daughter also reports she had shallow, harsh breathing last night as well. She has dementia. Some days she will respond, but this is getting fewer and farther in between. She has a history of dysphagia and is on a dysphagia 2 diet with nectar thick liquids. They report they are following this, but she has not really wanted food and has coughed with the thickened liquids.   She has had no fevers at home. NO vomiting or diarrhea. Unable to tell family symptoms/pains.   She does not smoke or drink alcohol.   ER Course:  vitals: afebrile, bp: 176/82, HR: 111, RR: 31, oxygen: 97%RA Pertinent labs: wbc: 15.9, sodium: 153, BUN: 49, creatinine: 2.79, UA: large LE, 21-50 WBC  CXR: no acute process CT head: no acute finding In ED: given 1L IVF bolus and rocephin for presumed UTI. Cultures obtained, TRH asked to admit.    Review of Systems: unable to review all systems due to the inability of the patient to answer questions. Past Medical History:  Diagnosis Date   A-fib [facility])     CVA (cerebral vascular accident) ([place])    Dementia ([place])    Diabetes mellitus without complication ([place])    GI bleed    Hyperlipemia    Hypertension    Thyroid disease    Past Surgical History:  Procedure Laterality Date   COLONOSCOPY N/A 05/16/2017   Procedure: COLONOSCOPY;  Surgeon: Lin Landsman, MD;  Location: [facility] ENDOSCOPY;  Service: Gastroenterology;  Laterality: N/A;   ESOPHAGOGASTRODUODENOSCOPY N/A 08/08/2016   Procedure: ESOPHAGOGASTRODUODENOSCOPY (EGD);  Surgeon: [name], MD;  Location[facility] ENDOSCOPY;  Service: Endoscopy;  Laterality: N/A;   ESOPHAGOGASTRODUODENOSCOPY N/A 05/16/2017   Procedure: ESOPHAGOGASTRODUODENOSCOPY (EGD);  Surgeon: Lin Landsman, MD;  Location: [facility] ENDOSCOPY;  Service: Gastroenterology;  Laterality: N/A;   ESOPHAGOGASTRODUODENOSCOPY (EGD) WITH PROPOFOL N/A 02/28/2017   Procedure: ESOPHAGOGASTRODUODENOSCOPY (EGD) WITH PROPOFOL;  Surgeon: [name], MD;  Location: [facility] ENDOSCOPY;  Service: Endoscopy;  Laterality: N/A;   EYE SURGERY     left   Social History:  reports that she has never smoked. She has never used smokeless tobacco. She reports that she does not drink alcohol and does not use drugs.  Allergies  Allergen Reactions   Ezetimibe Other (See Comments)    unknown   Iodine Rash   Rosuvastatin Rash    Joint pain    Family History  Problem Relation Age of Onset   Diabetes Mother    CVA Mother    Hypertension Mother    Diabetes Sister    Diabetes Brother  Prior to Admission medications   Medication Sig Start Date End Date Taking? Authorizing Provider  acetaminophen (TYLENOL) 325 MG tablet Take 1-2 tablets (325-650 mg total) by mouth every 4 (four) hours as needed for mild pain. 01/29/22   Angiulli, Lavon Paganini, PA-C  amLODipine (NORVASC) 5 MG tablet Take 2 tablets (10 mg total) by mouth daily. 01/29/22   Angiulli, Lavon Paganini, PA-C  aspirin EC 81 MG EC tablet Take 1 tablet (81 mg total) by mouth daily. Swallow whole.  01/03/22   Ghimire, Henreitta Leber, MD  cholecalciferol (VITAMIN D3) 25 MCG (1000 UNIT) tablet Take 1 tablet (1,000 Units total) by mouth daily. 01/29/22   Angiulli, Lavon Paganini, PA-C  glipiZIDE (GLUCOTROL) 10 MG tablet Take 1 tablet (10 mg total) by mouth daily after supper. 01/29/22   Angiulli, Lavon Paganini, PA-C  insulin glargine-yfgn (SEMGLEE) 100 UNIT/ML injection Inject 0.17 mLs (17 Units total) into the skin at bedtime. 01/29/22   Angiulli, Lavon Paganini, PA-C  iron polysaccharides (NIFEREX) 150 MG capsule Take 1 capsule (150 mg total) by mouth 2 (two) times daily. 05/16/17   [name], MD  latanoprost (XALATAN) 0.005 % ophthalmic solution Place 1 drop into both eyes at bedtime.    [provider]  levothyroxine (SYNTHROID) 112 MCG tablet Take 1 tablet (112 mcg total) by mouth as directed. Take 1 tablet Mon-Fri 01/29/22   Angiulli, Lavon Paganini, PA-C  Magnesium Gluconate 27.5 MG TABS Take by mouth. 01/29/22   [provider]  metoprolol tartrate (LOPRESSOR) 25 MG tablet Take 0.5 tablets (12.5 mg total) by mouth 2 (two) times daily. 01/31/22   Angiulli, Lavon Paganini, PA-C  Multiple Vitamin (MULTIVITAMIN WITH MINERALS) TABS tablet Take 1 tablet by mouth daily. 01/29/22   Angiulli, Lavon Paganini, PA-C  pantoprazole (PROTONIX) 40 MG tablet Take 1 tablet (40 mg total) by mouth 2 (two) times daily before a meal. 01/29/22   Angiulli, Lavon Paganini, PA-C  polycarbophil (FIBERCON) 625 MG tablet Take 1 tablet (625 mg total) by mouth daily. 01/29/22   Angiulli, Lavon Paganini, PA-C  vitamin B-12 (CYANOCOBALAMIN) 100 MCG tablet Take 1 tablet (100 mcg total) by mouth daily. 01/29/22   Cathlyn Parsons, PA-C    Physical Exam: Vitals:   05/10/22 1415 05/10/22 1545 05/10/22 1552 05/10/22 1745  BP: (!) 154/71 (!) 164/85  (!) 173/77  Pulse: (!) 55 94  (!) 48  Resp: (!) 27 (!) 25  (!) 25  Temp:   97.8 F (36.6 C)   TempSrc:      SpO2: 97% 96%  96%   General:  Appears calm and comfortable and is in NAD Eyes:  PERRL, EOMI, normal lids,  iris ENT:  grossly normal hearing, lips & tongue, mmm; appropriate dentition Neck:  no LAD, masses or thyromegaly; no carotid bruits Cardiovascular:  irregularly, irregular, no m/r/g. No LE edema.  Respiratory:   CTA bilaterally with no wheezes/rales/rhonchi.  Normal respiratory effort. Abdomen:  soft, NT, ND, NABS Back:   normal alignment, no CVAT Skin:  no rash or induration seen on limited exam Musculoskeletal:  grossly normal tone BUE/BLE-limited exam, would not participate. no bony abnormality Lower extremity:  No LE edema.  Limited foot exam with no ulcerations.  2+ distal pulses. Psychiatric:  could not assess. Non verbal. Does look at me and has a faint smile.  Neurologic:  CN 2-12 grossly intact. Spontaneous arm movement, does not move legs for me.    Radiological Exams on Admission: Independently reviewed - see discussion in A/P  where applicable  CT Head Wo Contrast  Result Date: 05/10/2022 CLINICAL DATA:  Dizziness.  Altered mental status EXAM: CT HEAD WITHOUT CONTRAST TECHNIQUE: Contiguous axial images were obtained from the base of the skull through the vertex without intravenous contrast. RADIATION DOSE REDUCTION: This exam was performed according to the departmental dose-optimization program which includes automated exposure control, adjustment of the mA and/or kV according to patient size and/or use of iterative reconstruction technique. COMPARISON:  CT head 12/24/2021 FINDINGS: Brain: Generalized atrophy. Negative for hydrocephalus. Extensive white matter hypodensity bilaterally. Small chronic cortical infarct right frontal parietal lobe. Chronic infarct right occipital lobe. Small chronic infarcts in the cerebellum bilaterally. Negative for acute infarct, hemorrhage, mass Vascular: Negative for hyperdense vessel Skull: Negative Sinuses/Orbits: Mucosal edema in bony thickening left sphenoid sinus. Small air-fluid level right sphenoid sinus. Bilateral cataract extraction Other:  None IMPRESSION: Generalized atrophy with extensive chronic ischemia No acute abnormality. Electronically Signed   By: Franchot Gallo M.D.   On: 05/10/2022 13:42   DG Chest Portable 1 View  Result Date: 05/10/2022 CLINICAL DATA:  Pneumonia. EXAM: PORTABLE CHEST 1 VIEW COMPARISON:  Dec 26, 2021. FINDINGS: The heart size and mediastinal contours are within normal limits. Both lungs are clear. The visualized skeletal structures are unremarkable. IMPRESSION: No active disease. Electronically Signed   By: Marijo Conception M.D.   On: 05/10/2022 12:20    EKG: Independently reviewed.  Sinus tachycardia with rate 111; nonspecific ST changes with no evidence of acute ischemia   Labs on Admission: I have personally reviewed the available labs and imaging studies at the time of the admission.  Pertinent labs:   wbc: 15.9,  sodium: 153,  BUN: 49,  creatinine: 2.79,  UA: large LE, 21-50 WBC   Assessment and Plan: Principal Problem:   Acute encephalopathy Active Problems:   Hypernatremia/dehydration/FTT   Sepsis secondary to UTI (HCC)   Chronic a-fib (HCC)   Dysphagia   Type 2 diabetes mellitus (HCC)   Hypertension   CKD (chronic kidney disease) stage 3, GFR 30-59 ml/min (HCC)   Hemiplegia and hemiparesis following cerebral infarction affecting right dominant side (HCC)   Acquired hypothyroidism   Hyperlipidemia   GERD (gastroesophageal reflux disease)   Memory loss    Assessment and Plan: * Acute encephalopathy 88 year old presenting with mental status change form her baseline in setting of poor PO intake and less responsiveness over 2-3 weeks. Does have a baseline of dementia, but she is less than responsive than her baseline.  -obs to telemetry -appears to be possible infectious cause as her urine appears infected, continue rocephin for now. Urine culture obtained.  -sepsis criteria met with tachycardia and leukocytosis, but clinically well appearing. Continue IVF, abx  -also dehydrated  with ketones in urine and hypernatremia. continue with IVF resuscitation with NS for today -check metabolic labs including Dudley/W96 and ammonia  -CT head with no acute finding and no focal deficits on exam  -protecting airway, hx of dysphagia. Aspiration precautions and SLP eval -palliative care consult in setting of FTT  Hypernatremia/dehydration/FTT 2-3 week history of poor PO intake. Hardly any food and will drink liquid Continue with NS IVF for today and then will repeat sodium later on today May need to change to more free water tomorrow Palliative care consult   Sepsis secondary to UTI ([place]) Sepsis criteria met, but also clinically dehydrated and could be contributing to both her leukocytosis and tachycardia Continue IVF and antibiotics Cultures obtained, lactic acid wnl  Dysphagia Continue dyphagia 2 diet and nectar thick liquids She has been refusing food and pills at times, may need to change to IV lopressor/protonix Continue to crush meds in applesauce/pudding, care order written  SLP to see Aspiration precautions  Palliative care consulted   Chronic a-fib (HCC) Continue lopressor BID No AC due to known AVM of duodenum with GI bleed   Type 2 diabetes mellitus ([place]) A1C in 12/2021 was 6.5, well controlled Hold glizipizde and long acting insulin while she is not eating any food, will do SSI for now  SSI and accuchecks qac/hs   Hypertension Continue home lopressor 12.29m BID and norvasc 569mdaily   CKD (chronic kidney disease) stage 3, GFR 30-59 ml/min (HCC) Stable at baseline Continue to monitor   Hemiplegia and hemiparesis following cerebral infarction affecting right dominant side ([facility]CGordonHead CT negative for acute finding No focal deficits Continue crestor/ASA    Acquired hypothyroidism With altered mental status, TSH pending Continue home dose for now: synthroid 11288mdaily   Hyperlipidemia Continue crestor 90m70mGERD (gastroesophageal reflux  disease) Continue protonix BID   Memory loss delerium precautions     Advance Care Planning:   Code Status: DNR   Consults: palliative care/SLP  DVT Prophylaxis: SCDs  Family Communication: daughters at bedside: AlbeCindra Eves AngeMarianna Fusseverity of Illness: The appropriate patient status for this patient is OBSERVATION. Observation status is judged to be reasonable and necessary in order to provide the required intensity of service to ensure the patient's safety. The patient's presenting symptoms, physical exam findings, and initial radiographic and laboratory data in the context of their medical condition is felt to place them at decreased risk for further clinical deterioration. Furthermore, it is anticipated that the patient will be medically stable for discharge from the hospital within 2 midnights of admission.   Author: AlliOrma Flaming 05/10/2022 6:27 PM  For on call review [email or web address]

## 2022-05-10 NOTE — Assessment & Plan Note (Addendum)
2-3 week history of poor PO intake. Hardly any food and will drink liquid Continue with NS IVF for today and then will repeat sodium later on today May need to change to more free water tomorrow Palliative care consult

## 2022-05-10 NOTE — Assessment & Plan Note (Signed)
Continue lopressor BID No AC due to known AVM of duodenum with GI bleed

## 2022-05-10 NOTE — Assessment & Plan Note (Addendum)
Continue dyphagia 2 diet and nectar thick liquids She has been refusing food and pills at times, may need to change to IV lopressor/protonix Continue to crush meds in applesauce/pudding, care order written  SLP to see Aspiration precautions  Palliative care consulted

## 2022-05-10 NOTE — Assessment & Plan Note (Addendum)
A1C in 12/2021 was 6.5, well controlled Hold glizipizde and long acting insulin while she is not eating any food, will do SSI for now  SSI and accuchecks qac/hs

## 2022-05-10 NOTE — Assessment & Plan Note (Signed)
Stable at baseline Continue to monitor

## 2022-05-10 NOTE — Assessment & Plan Note (Signed)
Sepsis criteria met, but also clinically dehydrated and could be contributing to both her leukocytosis and tachycardia Continue IVF and antibiotics Cultures obtained, lactic acid wnl

## 2022-05-10 NOTE — ED Triage Notes (Signed)
PT BIB [facility]. Per EMS family called due to pt "not acting right". Pt has hx of CVA three months prior to arrival with weakness and non-verbal. EMS reports pt has a pnemonia with no ATB.

## 2022-05-10 NOTE — Assessment & Plan Note (Signed)
Continue home lopressor 12.5mg  BID and norvasc 5mg  daily

## 2022-05-10 NOTE — Assessment & Plan Note (Signed)
Continue crestor 10mg  

## 2022-05-10 NOTE — Assessment & Plan Note (Signed)
With altered mental status, TSH pending Continue home dose for now: synthroid 166mcg/daily

## 2022-05-10 NOTE — Assessment & Plan Note (Signed)
Continue protonix BID 

## 2022-05-11 DIAGNOSIS — I69391 Dysphagia following cerebral infarction: Secondary | ICD-10-CM | POA: Diagnosis not present

## 2022-05-11 DIAGNOSIS — R638 Other symptoms and signs concerning food and fluid intake: Secondary | ICD-10-CM

## 2022-05-11 DIAGNOSIS — Z515 Encounter for palliative care: Secondary | ICD-10-CM

## 2022-05-11 DIAGNOSIS — K31819 Angiodysplasia of stomach and duodenum without bleeding: Secondary | ICD-10-CM | POA: Diagnosis present

## 2022-05-11 DIAGNOSIS — R1310 Dysphagia, unspecified: Secondary | ICD-10-CM | POA: Diagnosis present

## 2022-05-11 DIAGNOSIS — N184 Chronic kidney disease, stage 4 (severe): Secondary | ICD-10-CM | POA: Diagnosis present

## 2022-05-11 DIAGNOSIS — N3000 Acute cystitis without hematuria: Secondary | ICD-10-CM | POA: Diagnosis not present

## 2022-05-11 DIAGNOSIS — Z711 Person with feared health complaint in whom no diagnosis is made: Secondary | ICD-10-CM

## 2022-05-11 DIAGNOSIS — I482 Chronic atrial fibrillation, unspecified: Secondary | ICD-10-CM

## 2022-05-11 DIAGNOSIS — R4182 Altered mental status, unspecified: Secondary | ICD-10-CM | POA: Diagnosis present

## 2022-05-11 DIAGNOSIS — Z66 Do not resuscitate: Secondary | ICD-10-CM | POA: Diagnosis present

## 2022-05-11 DIAGNOSIS — N390 Urinary tract infection, site not specified: Secondary | ICD-10-CM

## 2022-05-11 DIAGNOSIS — E1122 Type 2 diabetes mellitus with diabetic chronic kidney disease: Secondary | ICD-10-CM | POA: Diagnosis present

## 2022-05-11 DIAGNOSIS — G9341 Metabolic encephalopathy: Secondary | ICD-10-CM | POA: Diagnosis present

## 2022-05-11 DIAGNOSIS — I129 Hypertensive chronic kidney disease with stage 1 through stage 4 chronic kidney disease, or unspecified chronic kidney disease: Secondary | ICD-10-CM | POA: Diagnosis present

## 2022-05-11 DIAGNOSIS — N179 Acute kidney failure, unspecified: Secondary | ICD-10-CM | POA: Diagnosis not present

## 2022-05-11 DIAGNOSIS — E860 Dehydration: Secondary | ICD-10-CM | POA: Diagnosis present

## 2022-05-11 DIAGNOSIS — Z789 Other specified health status: Secondary | ICD-10-CM

## 2022-05-11 DIAGNOSIS — R6520 Severe sepsis without septic shock: Secondary | ICD-10-CM | POA: Diagnosis present

## 2022-05-11 DIAGNOSIS — E039 Hypothyroidism, unspecified: Secondary | ICD-10-CM

## 2022-05-11 DIAGNOSIS — E871 Hypo-osmolality and hyponatremia: Secondary | ICD-10-CM | POA: Diagnosis present

## 2022-05-11 DIAGNOSIS — G9340 Encephalopathy, unspecified: Secondary | ICD-10-CM | POA: Diagnosis not present

## 2022-05-11 DIAGNOSIS — F0390 Unspecified dementia without behavioral disturbance: Secondary | ICD-10-CM | POA: Diagnosis present

## 2022-05-11 DIAGNOSIS — A4181 Sepsis due to Enterococcus: Secondary | ICD-10-CM | POA: Diagnosis present

## 2022-05-11 DIAGNOSIS — E785 Hyperlipidemia, unspecified: Secondary | ICD-10-CM | POA: Diagnosis present

## 2022-05-11 DIAGNOSIS — K219 Gastro-esophageal reflux disease without esophagitis: Secondary | ICD-10-CM | POA: Diagnosis present

## 2022-05-11 DIAGNOSIS — I69351 Hemiplegia and hemiparesis following cerebral infarction affecting right dominant side: Secondary | ICD-10-CM | POA: Diagnosis not present

## 2022-05-11 DIAGNOSIS — I480 Paroxysmal atrial fibrillation: Secondary | ICD-10-CM | POA: Diagnosis present

## 2022-05-11 DIAGNOSIS — Z794 Long term (current) use of insulin: Secondary | ICD-10-CM | POA: Diagnosis not present

## 2022-05-11 DIAGNOSIS — A419 Sepsis, unspecified organism: Secondary | ICD-10-CM

## 2022-05-11 DIAGNOSIS — R627 Adult failure to thrive: Secondary | ICD-10-CM | POA: Diagnosis present

## 2022-05-11 DIAGNOSIS — Z823 Family history of stroke: Secondary | ICD-10-CM | POA: Diagnosis not present

## 2022-05-11 DIAGNOSIS — Z7189 Other specified counseling: Secondary | ICD-10-CM

## 2022-05-11 DIAGNOSIS — E870 Hyperosmolality and hypernatremia: Secondary | ICD-10-CM | POA: Diagnosis present

## 2022-05-11 DIAGNOSIS — R413 Other amnesia: Secondary | ICD-10-CM

## 2022-05-11 LAB — CBC
HCT: 33.8 % — ABNORMAL LOW (ref 36.0–46.0)
Hemoglobin: 11 g/dL — ABNORMAL LOW (ref 12.0–15.0)
MCH: 26.7 pg (ref 26.0–34.0)
MCHC: 32.5 g/dL (ref 30.0–36.0)
MCV: 82 fL (ref 80.0–100.0)
Platelets: 274 10*3/uL (ref 150–400)
RBC: 4.12 MIL/uL (ref 3.87–5.11)
RDW: 15.2 % (ref 11.5–15.5)
WBC: 16.7 10*3/uL — ABNORMAL HIGH (ref 4.0–10.5)
nRBC: 0 % (ref 0.0–0.2)

## 2022-05-11 LAB — GLUCOSE, CAPILLARY
Glucose-Capillary: 126 mg/dL — ABNORMAL HIGH (ref 70–99)
Glucose-Capillary: 122 mg/dL — ABNORMAL HIGH (ref 70–99)
Glucose-Capillary: 137 mg/dL — ABNORMAL HIGH (ref 70–99)
Glucose-Capillary: 268 mg/dL — ABNORMAL HIGH (ref 70–99)
Glucose-Capillary: 271 mg/dL — ABNORMAL HIGH (ref 70–99)

## 2022-05-11 LAB — BASIC METABOLIC PANEL
Anion gap: 11 (ref 5–15)
BUN: 42 mg/dL — ABNORMAL HIGH (ref 8–23)
CO2: 22 mmol/L (ref 22–32)
Calcium: 8.1 mg/dL — ABNORMAL LOW (ref 8.9–10.3)
Chloride: 122 mmol/L — ABNORMAL HIGH (ref 98–111)
Creatinine, Ser: 2.21 mg/dL — ABNORMAL HIGH (ref 0.44–1.00)
GFR, Estimated: 21 mL/min — ABNORMAL LOW (ref >60)
Glucose, Bld: 140 mg/dL — ABNORMAL HIGH (ref 70–99)
Potassium: 4.2 mmol/L (ref 3.5–5.1)
Sodium: 155 mmol/L — ABNORMAL HIGH (ref 135–145)

## 2022-05-11 MED ORDER — DEXTROSE 5 % IV SOLN: INTRAVENOUS | Status: DC

## 2022-05-11 MED ORDER — ORAL CARE MOUTH RINSE
15.0000 mL | Freq: Two times a day (BID) | OROMUCOSAL | Status: DC
  Administered 2022-05-11 – 2022-05-14 (×7): 15 mL via OROMUCOSAL

## 2022-05-11 MED ORDER — ORAL CARE MOUTH RINSE: 15.0000 mL | OROMUCOSAL | Status: DC | PRN

## 2022-05-11 MED ORDER — SODIUM CHLORIDE 0.9 % IV SOLN
1.0000 g | Freq: Four times a day (QID) | INTRAVENOUS | Status: DC
  Filled 2022-05-11 (×3): qty 1000

## 2022-05-11 MED ORDER — SODIUM CHLORIDE 0.9 % IV SOLN
1.0000 g | Freq: Two times a day (BID) | INTRAVENOUS | Status: DC
  Administered 2022-05-11 – 2022-05-14 (×6): 1 g via INTRAVENOUS
  Filled 2022-05-11 (×7): qty 1000

## 2022-05-11 NOTE — Progress Notes (Signed)
Telemetry called and stated the pt had 2 significant runs of SVT, pt assessed, no changes noted, MD made aware. Will continue to monitor closely.

## 2022-05-11 NOTE — Progress Notes (Signed)
PHARMACY NOTE:  ANTIMICROBIAL RENAL DOSAGE ADJUSTMENT  Current antimicrobial regimen includes a mismatch between antimicrobial dosage and estimated renal function.  As per policy approved by the Pharmacy & Therapeutics and Medical Executive Committees, the antimicrobial dosage will be adjusted accordingly.  Current antimicrobial dosage:  Ampicillin 1g IV q6  Indication: UTI  Renal Function:  Estimated Creatinine Clearance: 14.4 mL/min (A) (by C-G formula based on SCr of 2.21 mg/dL (H)). []      On intermittent HD, scheduled: []      On CRRT    Antimicrobial dosage has been changed to:  Ampicillin 1g IV q12  Additional comments:  Onnie Boer, PharmD, BCIDP, AAHIVP, CPP Infectious Disease Pharmacist 05/11/2022 5:30 PM

## 2022-05-11 NOTE — Consult Note (Addendum)
Consultation Note Date: 05/11/2022   Patient Name: Patricia Dudley Dudley  DOB: December 07, 1929  MRN: [id number]  Age / Sex: 88 y.o., female  PCP: Tracie Harrier, MD Referring Physician: [name], MD  Reason for Consultation: Establishing goals of care, "FTT"  HPI/Patient Profile: 88 y.o. female  with past medical history of PAF not on anticoagulation secondary to known arteriovenous malformation of the duodenum with GI bleed in September 2018,  dementia, chronic kidney disease stage IIIb, hypothyroidism, T2DM, hypertension, glaucoma, hyperlipidemia, hx of CVA in 12/2021 presented to ED on 05/10/22 from home with family concerns of AMS and decreased oral intake. Patient was admitted on 05/10/2022 with acute encephalopathy, hypernatremia/dehydration/FTT, sepsis secondary to UTI, dysphagia.  Of note, she has had three hospitalizations in the last six months. Most recent admission was 5/09-10/25/65 for acute embolic stroke. She was seen by PMT during that hospitalization and discharged to rehab with [facility] outpatient Palliative Care to follow.  Clinical Assessment and Goals of Care: I have reviewed medical records including EPIC notes, labs, and imaging. Received report from primary RN - no acute concerns. RN reports patient is not eating/drinking.   Went to visit patient at bedside - daughter/HCPOA/Patricia Dudley present. Patient was lying in bed awake, alert, nonverbal, smiling. No signs or non-verbal gestures of pain or discomfort noted. No respiratory distress, increased work of breathing, or secretions noted.   Met with Patricia Dudley Dudley in 3W conference room  to discuss diagnosis, prognosis, GOC, EOL wishes, disposition, and options.  I re-introduced Palliative Medicine as specialized medical care for people living with serious illness. It focuses on providing relief from the symptoms and stress of a serious illness. The goal is to  improve quality of life for both the patient and the family.  We reviewed patient's interval history since last hospitalization. Patient was discharged to [facility]; however, family did not feel she was receiving adequate care and after 1 month moved her back home with HHPT/OT on March 02, 2022. Patient has 10 children - her 5 daughters rotate caring for patient in her private home during the week. One of the daughters lives with her permanently. Patricia Dudley Dudley noted a decline in patient "several weeks ago" - stating she was not eating but drinking thickened liquids, coughing with drinking, increased weakness.   Other daughter/Patricia Dudley joined meeting.  We discussed patient's current illness and what it means in the larger context of patient's on-going co-morbidities. Patricia Dudley Dudley understands that CKD and dementia are progressive, non-curable diseases underlying the patient's current acute medical conditions. We reviewed specific indicators of end stage dementia, including inability to communicate, bed bound/non-ambulatory status, decreased oral intake, swallowing dysfunction, and incontinence of bowel/bladder. Patricia Dudley Dudley remembers speaking with PMT during patient's previous hospital stay around these topics. She has a clear understanding of patient's current acute medical situation. Natural disease trajectory and expectations at EOL were discussed. I attempted to elicit values and goals of care important to the patient. The difference between aggressive medical intervention and comfort care was considered in light of the patient's goals of care.   Reviewed SLP concern for mild aspiration risk. Education provided that this does increased patient's risk for infection and rehospitalization.   Discussed UTI in context of patient's decline. Expressed concern, even if infection is managed, patient is likely still approaching end of life. Patricia Dudley Dudley tells me "she's [patient] is tired, " and "she's ready."  Provided education and  counseling at length on the philosophy and benefits of hospice care. Discussed that it offers a holistic approach to  care in the setting of end-stage illness, and is about supporting the patient where they are allowing nature to take it's course. Discussed the hospice team includes RNs, physicians, social workers, and chaplains. They can provide personal care, support for the family, and help keep patient out of the hospital as well as assist with DME needs for home hospice. Education provided on the difference between home vs residential hospice. Family are most interested in patient returning home with hospice - family are comfortable providing EOL care.  We talked about transition to comfort measures in house and what that would entail inclusive of medications to control pain, dyspnea, agitation, nausea, and itching. We discussed stopping all unnecessary measures such as blood draws, needle sticks, oxygen, antibiotics, CBGs/insulin, cardiac monitoring, IVF, and frequent vital signs. Family would like to continue current gentle medical interventions without escalation of care; they will likely be ready for her discharge back home with hospice Monday. Prognostication reviewed.  Visit also consisted of discussions dealing with the complex and emotionally intense issues of symptom management and palliative care in the setting of serious and potentially life-threatening illness.   Therapeutic listening and emotional support provided as family show me pictures of patient during her recent 78th birthday party. Pictures show she was extremely happy to be surrounded by friends and family celebrating her.   Offered chaplain support - family tell me patient has a Company secretary who will be seeing her.  Several Gone [employer] My [facility] books provided to assist family with EOL anticipated trajectory. Daughters reviewed and recognize many signs/symptoms indicative of patient approaching EOL.  Discussed with patient/family the  importance of continued conversation with each other and the medical providers regarding overall plan of care and treatment options, ensuring decisions are within the context of the patient's values and GOCs.    Questions and concerns were addressed. The patient/family was encouraged to call with questions and/or concerns. PMT card was provided.   Primary Decision Maker: HCPOA  - daughter/Patricia Dudley Justin Mend    SUMMARY OF RECOMMENDATIONS   Continue current gentle medical interventions without escalation of care  Continue DNR/DNI as previously documented - durable DNR form completed and placed in shadow chart. Copy was made and will be scanned into Vynca/ACP tab Goal is for patient discharge home with hospice. Family will likely be ready for her discharge Monday 9/18 Family request [facility] hospice services - [facility] consult placed; TOC and hospice liaison notified PMT will continue to follow and support holistically   Code Status/Advance Care Planning: DNR  Palliative Prophylaxis:  Aspiration, Bowel Regimen, Delirium Protocol, Eye Care, Frequent Pain Assessment, Oral Care, and Turn Reposition  Additional Recommendations (Limitations, Scope, Preferences): Avoid Hospitalization, Full Scope Treatment, No Artificial Feeding, No Surgical Procedures, and No Tracheostomy  Psycho-social/Spiritual:  Desire for further Chaplaincy support:no Created space and opportunity for patient and family to express thoughts and feelings regarding patient's current medical situation.  Emotional support and therapeutic listening provided.  Prognosis:  < 2 weeks if continues with minimal oral intake  Discharge Planning: Home with Hospice      Primary Diagnoses: Present on Admission:  Acquired hypothyroidism  Chronic a-fib (HCC)  Hyperlipidemia  GERD (gastroesophageal reflux disease)  Hypernatremia/dehydration/FTT  Acute encephalopathy  Hypertension   I have reviewed the medical record, interviewed the  patient and family, and examined the patient. The following aspects are pertinent.  Past Medical History:  Diagnosis Date   A-fib [facility])    CVA (cerebral vascular accident) ([place])    Dementia ([place])  Diabetes mellitus without complication ([place])    GI bleed    Hyperlipemia    Hypertension    Thyroid disease    Social History   Socioeconomic History   Marital status: Widowed    Spouse name: Not on file   Number of children: Not on file   Years of education: Not on file   Highest education level: Not on file  Occupational History   Occupation: retired  Tobacco Use   Smoking status: Never   Smokeless tobacco: Never  Vaping Use   Vaping Use: Never used  Substance and Sexual Activity   Alcohol use: No   Drug use: No   Sexual activity: Never  Other Topics Concern   Not on file  Social History Narrative   Not on file   Social Determinants of Health   Financial Resource Strain: Not on file  Food Insecurity: Not on file  Transportation Needs: Not on file  Physical Activity: Not on file  Stress: Not on file  Social Connections: Not on file   Family History  Problem Relation Age of Onset   Diabetes Mother    CVA Mother    Hypertension Mother    Diabetes Sister    Diabetes Brother    Scheduled Meds:  amLODipine  5 mg Oral Daily   aspirin  81 mg Oral Daily   insulin aspart  0-9 Units Subcutaneous TID WC   latanoprost  1 drop Both Eyes QHS   levothyroxine  112 mcg Oral Q0600   metoprolol tartrate  12.5 mg Oral BID   mouth rinse  15 mL Mouth Rinse BID   pantoprazole  40 mg Oral Daily   Continuous Infusions:  cefTRIAXone (ROCEPHIN)  IV     dextrose     PRN Meds:.acetaminophen **OR** acetaminophen, metoprolol tartrate, mouth rinse Medications Prior to Admission:  Prior to Admission medications   Medication Sig Start Date End Date Taking? Authorizing Provider  amLODipine (NORVASC) 5 MG tablet Take 2 tablets (10 mg total) by mouth daily. 01/29/22  Yes Angiulli, Lavon Paganini, PA-C  aspirin EC 81 MG EC tablet Take 1 tablet (81 mg total) by mouth daily. Swallow whole. 01/03/22  Yes Ghimire, Henreitta Leber, MD  cholecalciferol (VITAMIN D3) 25 MCG (1000 UNIT) tablet Take 1 tablet (1,000 Units total) by mouth daily. 01/29/22  Yes Angiulli, Lavon Paganini, PA-C  cyanocobalamin (VITAMIN B12) 1000 MCG tablet Take 1,000 mcg by mouth daily.   Yes [provider]  folic acid (FOLVITE) 962 MCG tablet Take 400 mcg by mouth daily.   Yes [provider]  insulin glargine (LANTUS) 100 UNIT/ML injection Inject 8 Units into the skin at bedtime.   Yes [provider]  latanoprost (XALATAN) 0.005 % ophthalmic solution Place 1 drop into both eyes at bedtime.   Yes [provider]  levothyroxine (SYNTHROID) 112 MCG tablet Take 1 tablet (112 mcg total) by mouth as directed. Take 1 tablet Mon-Fri 01/29/22  Yes Angiulli, Lavon Paganini, PA-C  metoprolol tartrate (LOPRESSOR) 25 MG tablet Take 0.5 tablets (12.5 mg total) by mouth 2 (two) times daily. 01/31/22  Yes Angiulli, Lavon Paganini, PA-C  pantoprazole (PROTONIX) 40 MG tablet Take 1 tablet (40 mg total) by mouth 2 (two) times daily before a meal. 01/29/22  Yes Angiulli, Lavon Paganini, PA-C  acetaminophen (TYLENOL) 325 MG tablet Take 1-2 tablets (325-650 mg total) by mouth every 4 (four) hours as needed for mild pain. Patient not taking: Reported on 05/10/2022 01/29/22   [place],  Lavon Paganini, PA-C  glipiZIDE (GLUCOTROL) 10 MG tablet Take 1 tablet (10 mg total) by mouth daily after supper. Patient not taking: Reported on 05/10/2022 01/29/22   Angiulli, Lavon Paganini, PA-C  insulin glargine-yfgn (SEMGLEE) 100 UNIT/ML injection Inject 0.17 mLs (17 Units total) into the skin at bedtime. Patient not taking: Reported on 05/10/2022 01/29/22   Angiulli, Lavon Paganini, PA-C  polycarbophil (FIBERCON) 625 MG tablet Take 1 tablet (625 mg total) by mouth daily. Patient not taking: Reported on 05/10/2022 01/29/22   Angiulli, Lavon Paganini, PA-C  vitamin B-12 (CYANOCOBALAMIN) 100  MCG tablet Take 1 tablet (100 mcg total) by mouth daily. Patient not taking: Reported on 05/10/2022 01/29/22   Cathlyn Parsons, PA-C   Allergies  Allergen Reactions   Ezetimibe Other (See Comments)    unknown   Iodine Rash   Rosuvastatin Rash    Joint pain   Review of Systems  Unable to perform ROS: Dementia    Physical Exam Vitals and nursing note reviewed.  Constitutional:      General: She is not in acute distress.    Appearance: She is ill-appearing.  Pulmonary:     Effort: No respiratory distress.  Skin:    General: Skin is warm and dry.  Neurological:     Mental Status: She is alert.     Motor: Weakness present.  Psychiatric:        Speech: She is noncommunicative.        Behavior: Behavior is slowed.        Cognition and Memory: Cognition is impaired. Memory is impaired.     Vital Signs: BP (!) 155/71 (BP Location: Left Arm)   Pulse 86   Temp 97.9 F (36.6 C) (Axillary)   Resp 16   SpO2 100%  Pain Scale: PAINAD       SpO2: SpO2: 100 % O2 Device:SpO2: 100 % O2 Flow Rate: .   IO: Intake/output summary:  Intake/Output Summary (Last 24 hours) at 05/11/2022 1056 Last data filed at 05/11/2022 0600 Gross per 24 hour  Intake 500 ml  Output 400 ml  Net 100 ml    LBM: Last BM Date :  (PTA) Baseline Weight:   Most recent weight:       Palliative Assessment/Data: PPS 10%     Time In: 1100 Time Out: 1230 Time Total: 90 minutes  Greater than 50%  of this time was spent counseling and coordinating care related to the above assessment and plan.  Signed by: Lin Landsman, NP   Please contact Palliative Medicine Team phone at [phone] for questions and concerns.  For individual provider: See Amion  *Portions of this note are a verbal dictation therefore any spelling and/or grammatical errors are due to the "[place] [employer]" system interpretation.

## 2022-05-11 NOTE — Progress Notes (Signed)
Manufacturing engineer [facility]) Hospital Liaison Note  Referral received for patient/family interest in [place]. Chart under review by [facility] physician.   Hospice eligibility confirmed.   Unfortunately, Hospice Home is unable to offer a room today. Hospital Liaison will follow up tomorrow or sooner if a room becomes available. Please do not hesitate to call with questions.    Please call with any hospice questions or concerns. Thank you  Roselee Nova, [place] Hospital Liaison [phone]

## 2022-05-11 NOTE — Progress Notes (Signed)
[facility]  PROGRESS NOTE  Patricia Dudley KZL:[id number] DOB: [birth year] DOA: 05/10/2022 PCP: Tracie Harrier, MD   Brief HPI:   88 year old female with history of PAF not on anticoagulation due to AVM of the duodenum with GI bleed in September 2018, dementia, CKD stage IIIb, hypothyroidism, diabetes mellitus type 2, hypertension, glaucoma, hyperlipidemia, history of CVA in 5/23 came to ED with complaints of altered mental status.  As per daughter patient over the past 2 to 3 weeks had stopped eating and became less responsive than her baseline.  When patient's family checked on her she was having shallow breathing, was slow to respond.  She has history of dysphagia and is on dysphagia 2 diet with nectar thick liquids.    Subjective   Patient seen and examined, continues to lethargic.  Sodium significantly elevated at 155.   Assessment/Plan:    Acute metabolic encephalopathy -Patient presented with mental status change in the setting of dementia, poor p.o. intake over the past 2 to 3 weeks -Empirically started on Rocephin for possible UTI; urine culture growing greater than 100,000 colonies per mL of Enterococcus faecalis; will switch from IV Rocephin to ampicillin  Sepsis due to UTI -Urine culture growing Enterococcus faecalis as above -Started on ampicillin  Hypernatremia -Patient presented with hyponatremia in setting of poor p.o. intake -We will start D5W at 75 mL/h  Dysphagia -Swallow evaluation obtained today -Patient started on dysphagia 1 diet with nectar thick liquids  Chronic atrial fibrillation -Continue Lopressor twice daily -Not on anticoagulation due to history of AVMs of duodenum with GI bleed  Diabetes mellitus type 2 -Hemoglobin A1c 6.5, well controlled -Glipizide on hold -Continue sliding scale insulin with NovoLog  History of CVA -Continue aspirin, Crestor  Hypothyroidism -Continue Synthroid 112 mcg daily  Hyperlipidemia -Continue  Crestor 10 mg daily  GERD -Continue Protonix  Goals of care -Palliative care consulted for goals of care -Family considering hospice care   Medications     amLODipine  5 mg Oral Daily   aspirin  81 mg Oral Daily   insulin aspart  0-9 Units Subcutaneous TID WC   latanoprost  1 drop Both Eyes QHS   levothyroxine  112 mcg Oral Q0600   metoprolol tartrate  12.5 mg Oral BID   mouth rinse  15 mL Mouth Rinse BID   pantoprazole  40 mg Oral Daily     Data Reviewed:   CBG:  Recent Labs  Lab 05/10/22 2245 05/11/22 0629 05/11/22 0741 05/11/22 1220 05/11/22 1549  GLUCAP 151* 126* 122* 137* 268*    SpO2: 97 %    Vitals:   05/11/22 0748 05/11/22 1026 05/11/22 1200 05/11/22 1547  BP: (!) 155/71  (!) 175/91 (!) 153/127  Pulse:  86 80 93  Resp: 16  18 18   Temp: 97.9 F (36.6 C)  97.7 F (36.5 C) 98.3 F (36.8 C)  TempSrc: Axillary  Oral Oral  SpO2: 100%  100% 97%      Data Reviewed:  Basic Metabolic Panel: Recent Labs  Lab 05/10/22 1208 05/10/22 2100 05/11/22 0457  NA 153* 151* 155*  K 4.9 4.2 4.2  CL 116* 116* 122*  CO2 23 21* 22  GLUCOSE 143* 174* 140*  BUN 49* 44* 42*  CREATININE 2.79* 2.56* 2.21*  CALCIUM 9.2 8.5* 8.1*    CBC: Recent Labs  Lab 05/10/22 1208 05/11/22 0457  WBC 15.9* 16.7*  NEUTROABS 12.6*  --   HGB 13.6 11.0*  HCT 44.4 33.8*  MCV 85.7 82.0  PLT 320 274    LFT Recent Labs  Lab 05/10/22 1208  AST 22  ALT 12  ALKPHOS 70  BILITOT 1.1  PROT 7.7  ALBUMIN 3.2*     Antibiotics: Anti-infectives (From admission, onward)    Start     Dose/Rate Route Frequency Ordered Stop   05/11/22 1200  cefTRIAXone (ROCEPHIN) 2 g in sodium chloride 0.9 % 100 mL IVPB        2 g 200 mL/hr over 30 Minutes Intravenous Every 24 hours 05/10/22 1537 05/16/22 1159   05/10/22 1430  cefTRIAXone (ROCEPHIN) 2 g in sodium chloride 0.9 % 100 mL IVPB        2 g 200 mL/hr over 30 Minutes Intravenous  Once 05/10/22 1418 05/10/22 1511        DVT  prophylaxis: SCDs  Code Status: DNR  Family Communication: No family at bedside   CONSULTS palliative care   Objective    Physical Examination:   General: Appears in no acute distress Cardiovascular: S1-S2, regular, no murmur auscultated Respiratory: Clear to auscultation bilaterally Abdomen: Abdomen is soft, nontender, no organomegaly Extremities: No edema in the lower extremities Neurologic: Alert, responds to verbal stimuli, moving all extremities   Status is: Inpatient:             [name]   [facility] If 7PM-7AM, please contact night-coverage at [email or web address], Office  [phone]   05/11/2022, 5:16 PM  LOS: 0 days

## 2022-05-11 NOTE — Evaluation (Signed)
Clinical/Bedside Swallow Evaluation Patient Details  Name: Patricia Dudley MRN: [id number] Date of Birth: 1930/08/21  Today's Date: 05/11/2022 Time: SLP Start Time (ACUTE ONLY): 0845 SLP Stop Time (ACUTE ONLY): 0900 SLP Time Calculation (min) (ACUTE ONLY): 15 min  Past Medical History:  Past Medical History:  Diagnosis Date   A-fib ([place])    CVA (cerebral vascular accident) ([place])    Dementia ([place])    Diabetes mellitus without complication ([place])    GI bleed    Hyperlipemia    Hypertension    Thyroid disease    Past Surgical History:  Past Surgical History:  Procedure Laterality Date   COLONOSCOPY N/A 05/16/2017   Procedure: COLONOSCOPY;  Surgeon: Patricia Landsman, MD;  Location: [facility];  Service: Gastroenterology;  Laterality: N/A;   ESOPHAGOGASTRODUODENOSCOPY N/A 08/08/2016   Procedure: ESOPHAGOGASTRODUODENOSCOPY (EGD);  Surgeon: Patricia Silvas, MD;  Location[facility] ENDOSCOPY;  Service: Endoscopy;  Laterality: N/A;   ESOPHAGOGASTRODUODENOSCOPY N/A 05/16/2017   Procedure: ESOPHAGOGASTRODUODENOSCOPY (EGD);  Surgeon: Patricia Landsman, MD;  Location: [facility] ENDOSCOPY;  Service: Gastroenterology;  Laterality: N/A;   ESOPHAGOGASTRODUODENOSCOPY (EGD) WITH PROPOFOL N/A 02/28/2017   Procedure: ESOPHAGOGASTRODUODENOSCOPY (EGD) WITH PROPOFOL;  Surgeon: Patricia Lame, MD;  Location: [facility] ENDOSCOPY;  Service: Endoscopy;  Laterality: N/A;   EYE SURGERY     left   HPI:  Patricia Dudley is a 88 y.o. female with medical history significant of PAF not on anticoagulation secondary to known arteriovenous malformation of the duodenum with GI bleed in September 2018,  dementia, chronic kidney disease stage IIIb, hypothyroidism, T2DM, hypertension, glaucoma, hyperlipidemia, hx of CVA in 12/2021 who presented to ED with complaints of AMS. Daughters give history. They state over the last 2-3 weeks she has stopped eating much and has become less responsive than her baseline.  This morning when  they went in to wake her up she was hard to rouse, looked like she was not taking deep breaths and her color looked off. Her daughter also reports she had shallow, harsh breathing last night as well. She has dementia. Some days she will respond, but this is getting fewer and farther in between. She has a history of dysphagia and is on a dysphagia 2 diet with nectar thick liquids. They report they are following this, but she has not really wanted food and has coughed with the thickened liquids.   CT of the head was showing generalized atrophy with extensive chronic ischemia with no acute abnormality.  Chest xray was showing no active disease.    Assessment / Plan / Recommendation  Clinical Impression  Clinical swallowing evaluation was completed using nectar thick liquids via spoon and cup and pureed material.  Patient is known to [facility] service from previous admission in March of 2023.  She had two MBS's completed during that admission.  Most recent one recommended a dysphagia 1 diet with nectar thick liquids.  Patricia Dudley was seen on that study given thin liquids and she was diagnosed with oropharyngeal dysphagia.  RN approve patient for PO intake and stated that there have not been any reports of trouble swallowing  but that she has been lethargic.  Per H&P family reporting decreased intake and coughing with nectar thick liquids.  She continues to present with ongoing oropharygneal dysphagia that continues to appear to be impacted by mentation.  She was accepting of oral trials.  Some oral holding was noted that required cues to assist with swallowing.  Slow oral transit was seen but she did appear  to clear all material from her oral cavity.  Mastication motion was observed given pureed material as was double swallows.  Swallow trigger was appreciated to palpation and overt s/s of Dudley were not seen.  Suggest a dysphagia 1 diet with nectar thick liquids.  Consider repeat MBS if respiratory status  declines.  ST will follow for therapeutic diet tolerance. SLP Visit Diagnosis: Dysphagia, oropharyngeal phase (R13.12)    Dudley Risk  Mild Dudley risk    Diet Recommendation   Dysphagia 1 with nectar thick liquids.  Medication Administration: Crushed with puree    Other  Recommendations Oral Care Recommendations: Oral care BID Other Recommendations: Order thickener from pharmacy;Prohibited food (jello, ice cream, thin soups)    Recommendations for follow up therapy are one component of a multi-disciplinary discharge planning process, led by the attending physician.  Recommendations may be updated based on patient status, additional functional criteria and insurance authorization.  Follow up Recommendations Other (comment) (TBD)      Assistance Recommended at Discharge Frequent or constant Supervision/Assistance  Functional Status Assessment Patient has had a recent decline in their functional status and demonstrates the ability to make significant improvements in function in a reasonable and predictable amount of time.  Frequency and Duration min 1 x/week  2 weeks       Prognosis Prognosis for Safe Diet Advancement: Fair Barriers to Reach Goals: Cognitive deficits      Swallow Study   General Date of Onset: 05/10/22 HPI: Patricia Dudley is a 88 y.o. female with medical history significant of PAF not on anticoagulation secondary to known arteriovenous malformation of the duodenum with GI bleed in September 2018,  dementia, chronic kidney disease stage IIIb, hypothyroidism, T2DM, hypertension, glaucoma, hyperlipidemia, hx of CVA in 12/2021 who presented to ED with complaints of AMS. Daughters give history. They state over the last 2-3 weeks she has stopped eating much and has become less responsive than her baseline.  This morning when they went in to wake her up she was hard to rouse, looked like she was not taking deep breaths and her color looked off. Her daughter also  reports she had shallow, harsh breathing last night as well. She has dementia. Some days she will respond, but this is getting fewer and farther in between. She has a history of dysphagia and is on a dysphagia 2 diet with nectar thick liquids. They report they are following this, but she has not really wanted food and has coughed with the thickened liquids.   CT of the head was showing generalized atrophy with extensive chronic ischemia with no acute abnormality.  Chest xray was showing no active disease. Type of Study: Bedside Swallow Evaluation Previous Swallow Assessment: MBS x 2 05/17 & 01/18/22  Rx for D1/nectar liquids Diet Prior to this Study: Dysphagia 2 (chopped);Nectar-thick liquids Temperature Spikes Noted: No Respiratory Status: Room air History of Recent Intubation: No Behavior/Cognition: Alert;Doesn't follow directions Oral Cavity Assessment: Dry Oral Care Completed by SLP: No Self-Feeding Abilities: Total assist Patient Positioning: Upright in bed Baseline Vocal Quality: Not observed Volitional Cough: Cognitively unable to elicit Volitional Swallow: Unable to elicit    Oral/Motor/Sensory Function Overall Oral Motor/Sensory Function: Other (comment) (Unable to assess)   Ice Chips Ice chips: Not tested   Thin Liquid Thin Liquid: Not tested    Nectar Thick Nectar Thick Liquid: Impaired Presentation: Spoon;Cup Oral Phase Impairments: Impaired mastication Oral phase functional implications: Prolonged oral transit   Honey Thick Honey Thick Liquid: Not tested  Puree Puree: Impaired Presentation: Spoon Oral Phase Impairments: Impaired mastication Oral Phase Functional Implications: Oral holding;Prolonged oral transit   Solid     Solid: Not tested       Shelly Flatten, MA, CCC-SLP Acute Rehab SLP [phone]  Lamar Sprinkles 05/11/2022,9:12 AM

## 2022-05-12 DIAGNOSIS — E039 Hypothyroidism, unspecified: Secondary | ICD-10-CM | POA: Diagnosis not present

## 2022-05-12 DIAGNOSIS — E870 Hyperosmolality and hypernatremia: Secondary | ICD-10-CM | POA: Diagnosis not present

## 2022-05-12 DIAGNOSIS — N3000 Acute cystitis without hematuria: Secondary | ICD-10-CM | POA: Diagnosis not present

## 2022-05-12 DIAGNOSIS — G9340 Encephalopathy, unspecified: Secondary | ICD-10-CM | POA: Diagnosis not present

## 2022-05-12 LAB — GLUCOSE, CAPILLARY
Glucose-Capillary: 172 mg/dL — ABNORMAL HIGH (ref 70–99)
Glucose-Capillary: 235 mg/dL — ABNORMAL HIGH (ref 70–99)
Glucose-Capillary: 219 mg/dL — ABNORMAL HIGH (ref 70–99)
Glucose-Capillary: 165 mg/dL — ABNORMAL HIGH (ref 70–99)

## 2022-05-12 LAB — URINE CULTURE: Culture: >=100000 — AB

## 2022-05-12 LAB — BASIC METABOLIC PANEL
Anion gap: 11 (ref 5–15)
BUN: 37 mg/dL — ABNORMAL HIGH (ref 8–23)
CO2: 19 mmol/L — ABNORMAL LOW (ref 22–32)
Calcium: 8.1 mg/dL — ABNORMAL LOW (ref 8.9–10.3)
Chloride: 119 mmol/L — ABNORMAL HIGH (ref 98–111)
Creatinine, Ser: 2.11 mg/dL — ABNORMAL HIGH (ref 0.44–1.00)
GFR, Estimated: 22 mL/min — ABNORMAL LOW (ref >60)
Glucose, Bld: 187 mg/dL — ABNORMAL HIGH (ref 70–99)
Potassium: 3.9 mmol/L (ref 3.5–5.1)
Sodium: 149 mmol/L — ABNORMAL HIGH (ref 135–145)

## 2022-05-12 MED ORDER — POLYVINYL ALCOHOL 1.4 % OP SOLN
1.0000 [drp] | OPHTHALMIC | Status: DC | PRN
  Filled 2022-05-12: qty 15

## 2022-05-12 NOTE — Progress Notes (Addendum)
[facility]  PROGRESS NOTE  Patricia Dudley YPP:[id number] DOB: [birth year] DOA: 05/10/2022 PCP: Tracie Harrier, MD   Brief HPI:   88 year old female with history of PAF not on anticoagulation due to AVM of the duodenum with GI bleed in September 2018, dementia, CKD stage IIIb, hypothyroidism, diabetes mellitus type 2, hypertension, glaucoma, hyperlipidemia, history of CVA in 5/23 came to ED with complaints of altered mental status.  As per daughter patient over the past 2 to 3 weeks had stopped eating and became less responsive than her baseline.  When patient's family checked on her she was having shallow breathing, was slow to respond.  She has history of dysphagia and is on dysphagia 2 diet with nectar thick liquids.   Subjective   Patient seen and examined, no new complaints.  Daughter at bedside.  Sodium has improved to 149.  Hospice consulted, awaiting bed placement at residential hospice   Assessment/Plan:    Acute metabolic encephalopathy -Patient presented with mental status change in the setting of dementia, poor p.o. intake over the past 2 to 3 weeks -Empirically started on Rocephin for possible UTI; urine culture growing greater than 100,000 colonies per mL of Enterococcus faecalis;  -Antibiotics switched from IV Rocephin to IV ampicillin  Sepsis due to UTI -Urine culture growing Enterococcus faecalis as above -Started on ampicillin as above  Acute kidney injury on CKD stage IV -Creatinine improving with IV fluids, today creatinine is 2.11  Hypernatremia -Patient presented with hyponatremia with sodium up to 155 in setting of poor p.o. intake -Started on D5W at 75 mL/h -Sodium improving, today sodium is down to 149  Dysphagia -Swallow evaluation obtained today -Patient started on dysphagia 1 diet with nectar thick liquids  Chronic atrial fibrillation -Continue Lopressor twice daily -Not on anticoagulation due to history of AVMs of duodenum with GI  bleed  Hypertension -Continue metoprolol, amlodipine  Diabetes mellitus type 2 -Hemoglobin A1c 6.5, well controlled -Glipizide on hold -Continue sliding scale insulin with NovoLog  History of CVA -Continue aspirin, Crestor  Hypothyroidism -Continue Synthroid 112 mcg daily  Hyperlipidemia -Continue Crestor 10 mg daily  GERD -Continue Protonix  Goals of care -Palliative care consulted for goals of care -Family has opted for hospice care -Awaiting bed at residential hospice   Medications     amLODipine  5 mg Oral Daily   aspirin  81 mg Oral Daily   insulin aspart  0-9 Units Subcutaneous TID WC   latanoprost  1 drop Both Eyes QHS   levothyroxine  112 mcg Oral Q0600   metoprolol tartrate  12.5 mg Oral BID   mouth rinse  15 mL Mouth Rinse BID   pantoprazole  40 mg Oral Daily     Data Reviewed:   CBG:  Recent Labs  Lab 05/11/22 0741 05/11/22 1220 05/11/22 1549 05/11/22 1924 05/12/22 0620  GLUCAP 122* 137* 268* 271* 172*    SpO2: 99 %    Vitals:   05/11/22 1958 05/12/22 0024 05/12/22 0341 05/12/22 0809  BP: 130/86 (!) 145/67 (!) 158/125 (!) 161/72  Pulse:  77 60 62  Resp: 16 15 13 16   Temp: 97.8 F (36.6 C) 98.9 F (37.2 C) 98.6 F (37 C) 99.1 F (37.3 C)  TempSrc: Oral Oral Oral Oral  SpO2: 98% 95% 98% 99%  Weight:          Data Reviewed:  Basic Metabolic Panel: Recent Labs  Lab 05/10/22 1208 05/10/22 2100 05/11/22 0457 05/12/22 0501  NA 153* 151* 155* 149*  K 4.9 4.2 4.2 3.9  CL 116* 116* 122* 119*  CO2 23 21* 22 19*  GLUCOSE 143* 174* 140* 187*  BUN 49* 44* 42* 37*  CREATININE 2.79* 2.56* 2.21* 2.11*  CALCIUM 9.2 8.5* 8.1* 8.1*    CBC: Recent Labs  Lab 05/10/22 1208 05/11/22 0457  WBC 15.9* 16.7*  NEUTROABS 12.6*  --   HGB 13.6 11.0*  HCT 44.4 33.8*  MCV 85.7 82.0  PLT 320 274    LFT Recent Labs  Lab 05/10/22 1208  AST 22  ALT 12  ALKPHOS 70  BILITOT 1.1  PROT 7.7  ALBUMIN 3.2*      Antibiotics: Anti-infectives (From admission, onward)    Start     Dose/Rate Route Frequency Ordered Stop   05/11/22 1815  ampicillin (OMNIPEN) 1 g in sodium chloride 0.9 % 100 mL IVPB  Status:  Discontinued        1 g 300 mL/hr over 20 Minutes Intravenous Every 6 hours 05/11/22 1725 05/11/22 1729   05/11/22 1815  ampicillin (OMNIPEN) 1 g in sodium chloride 0.9 % 100 mL IVPB        1 g 300 mL/hr over 20 Minutes Intravenous 2 times daily 05/11/22 1729     05/11/22 1200  cefTRIAXone (ROCEPHIN) 2 g in sodium chloride 0.9 % 100 mL IVPB  Status:  Discontinued        2 g 200 mL/hr over 30 Minutes Intravenous Every 24 hours 05/10/22 1537 05/11/22 1724   05/10/22 1430  cefTRIAXone (ROCEPHIN) 2 g in sodium chloride 0.9 % 100 mL IVPB        2 g 200 mL/hr over 30 Minutes Intravenous  Once 05/10/22 1418 05/10/22 1511        DVT prophylaxis: SCDs  Code Status: DNR  Family Communication: No family at bedside   CONSULTS palliative care   Objective    Physical Examination:   General-appears in no acute distress Heart-S1-S2, regular, no murmur auscultated Lungs-clear to auscultation bilaterally, no wheezing or crackles auscultated Abdomen-soft, nontender, no organomegaly Extremities-no edema in the lower extremities Neuro-alert, responds to verbal stimuli, confused   Status is: Inpatient:             [facility] If 7PM-7AM, please contact night-coverage at [email or web address], Office  [phone]   05/12/2022, 10:28 AM  LOS: 1 day

## 2022-05-12 NOTE — Progress Notes (Signed)
[facility]) Hospital Liaison Note  [facility] liaison spoke with patient's daughter and at this time they have not decided on home hospice vs IPU. They have toured hospice home, but are unsure. [name] states she will inform [facility] when she has made a decision.   Please call with any questions or concerns. Thank you  Roselee Nova, [place] Liaison [phone]

## 2022-05-12 NOTE — Care Management (Signed)
Per [facility] hospice liaison, family is touring hospice facility today at 1pm to assist in their decision between home hospice and residential hospice.

## 2022-05-13 DIAGNOSIS — E870 Hyperosmolality and hypernatremia: Secondary | ICD-10-CM | POA: Diagnosis not present

## 2022-05-13 DIAGNOSIS — G9340 Encephalopathy, unspecified: Secondary | ICD-10-CM | POA: Diagnosis not present

## 2022-05-13 DIAGNOSIS — E039 Hypothyroidism, unspecified: Secondary | ICD-10-CM | POA: Diagnosis not present

## 2022-05-13 DIAGNOSIS — N3000 Acute cystitis without hematuria: Secondary | ICD-10-CM | POA: Diagnosis not present

## 2022-05-13 LAB — CBC
HCT: 34.5 % — ABNORMAL LOW (ref 36.0–46.0)
Hemoglobin: 11.3 g/dL — ABNORMAL LOW (ref 12.0–15.0)
MCH: 26.2 pg (ref 26.0–34.0)
MCHC: 32.8 g/dL (ref 30.0–36.0)
MCV: 80 fL (ref 80.0–100.0)
Platelets: 281 10*3/uL (ref 150–400)
RBC: 4.31 MIL/uL (ref 3.87–5.11)
RDW: 15.1 % (ref 11.5–15.5)
WBC: 12 10*3/uL — ABNORMAL HIGH (ref 4.0–10.5)
nRBC: 0 % (ref 0.0–0.2)

## 2022-05-13 LAB — BASIC METABOLIC PANEL
Anion gap: 12 (ref 5–15)
BUN: 28 mg/dL — ABNORMAL HIGH (ref 8–23)
CO2: 21 mmol/L — ABNORMAL LOW (ref 22–32)
Calcium: 8.8 mg/dL — ABNORMAL LOW (ref 8.9–10.3)
Chloride: 116 mmol/L — ABNORMAL HIGH (ref 98–111)
Creatinine, Ser: 2 mg/dL — ABNORMAL HIGH (ref 0.44–1.00)
GFR, Estimated: 23 mL/min — ABNORMAL LOW (ref >60)
Glucose, Bld: 184 mg/dL — ABNORMAL HIGH (ref 70–99)
Potassium: 3.7 mmol/L (ref 3.5–5.1)
Sodium: 149 mmol/L — ABNORMAL HIGH (ref 135–145)

## 2022-05-13 LAB — GLUCOSE, CAPILLARY
Glucose-Capillary: 233 mg/dL — ABNORMAL HIGH (ref 70–99)
Glucose-Capillary: 217 mg/dL — ABNORMAL HIGH (ref 70–99)
Glucose-Capillary: 240 mg/dL — ABNORMAL HIGH (ref 70–99)
Glucose-Capillary: 142 mg/dL — ABNORMAL HIGH (ref 70–99)

## 2022-05-13 NOTE — Progress Notes (Signed)
[facility]  PROGRESS NOTE  Patricia Dudley XNA:[id number] DOB: Nov 17, 1929 DOA: 05/10/2022 PCP: Patricia Harrier, MD   Brief HPI:   88 year old female with history of PAF not on anticoagulation due to AVM of the duodenum with GI bleed in September 2018, dementia, CKD stage IIIb, hypothyroidism, diabetes mellitus type 2, hypertension, glaucoma, hyperlipidemia, history of CVA in 5/23 came to ED with complaints of altered mental status.  As per daughter patient over the past 2 to 3 weeks had stopped eating and became less responsive than her baseline.  When patient's family checked on her she was having shallow breathing, was slow to respond.  She has history of dysphagia and is on dysphagia 2 diet with nectar thick liquids.   Subjective   Patient seen and examined, no new complaints.     Assessment/Plan:    Acute metabolic encephalopathy -Resolved, back to baseline -Patient presented with mental status change in the setting of dementia, poor p.o. intake over the past 2 to 3 weeks -Empirically started on Rocephin for possible UTI; urine culture growing greater than 100,000 colonies per mL of Enterococcus faecalis;  -Antibiotics switched from IV Rocephin to IV ampicillin  Sepsis due to UTI -Urine culture growing Enterococcus faecalis as above -Started on ampicillin as above -We will continue antibiotics while in the hospital  Acute kidney injury on CKD stage IV -Creatinine improving with IV fluids, today creatinine is 2.00  Hypernatremia -Patient presented with hyponatremia with sodium up to 155 in setting of poor p.o. intake -Started on D5W at 75 mL/h -Sodium improving, today sodium is down to 149  Dysphagia -Swallow evaluation obtained today -Patient started on dysphagia 1 diet with nectar thick liquids  Chronic atrial fibrillation -Continue Lopressor twice daily -Not on anticoagulation due to history of AVMs of duodenum with GI bleed  Hypertension -Continue  metoprolol, amlodipine  Diabetes mellitus type 2 -Hemoglobin A1c 6.5, well controlled -Glipizide on hold -Continue sliding scale insulin with NovoLog  History of CVA -Continue aspirin, Crestor  Hypothyroidism -Continue Synthroid 112 mcg daily  Hyperlipidemia -Continue Crestor 10 mg daily  GERD -Continue Protonix  Goals of care -Palliative care consulted for goals of care -Family has opted for hospice care -Awaiting bed at residential hospice -Likely discharge in a.m.   Medications     amLODipine  5 mg Oral Daily   aspirin  81 mg Oral Daily   insulin aspart  0-9 Units Subcutaneous TID WC   latanoprost  1 drop Both Eyes QHS   levothyroxine  112 mcg Oral Q0600   metoprolol tartrate  12.5 mg Oral BID   mouth rinse  15 mL Mouth Rinse BID   pantoprazole  40 mg Oral Daily     Data Reviewed:   CBG:  Recent Labs  Lab 05/12/22 1155 05/12/22 1608 05/12/22 2101 05/13/22 0636 05/13/22 1137  GLUCAP 235* 219* 165* 233* 217*    SpO2: 99 %    Vitals:   05/13/22 0405 05/13/22 0731 05/13/22 0737 05/13/22 1117  BP: (!) 175/82 (!) 182/87 (!) 174/89 (!) 163/80  Pulse: (!) 53 66 84 88  Resp:  18  14  Temp: 98.4 F (36.9 C) 98.3 F (36.8 C)  98.6 F (37 C)  TempSrc: Oral Oral  Oral  SpO2: 98% 95%  99%  Weight:          Data Reviewed:  Basic Metabolic Panel: Recent Labs  Lab 05/10/22 1208 05/10/22 2100 05/11/22 0457 05/12/22 0501 05/13/22 1049  NA 153* 151* 155* 149*  149*  K 4.9 4.2 4.2 3.9 3.7  CL 116* 116* 122* 119* 116*  CO2 23 21* 22 19* 21*  GLUCOSE 143* 174* 140* 187* 184*  BUN 49* 44* 42* 37* 28*  CREATININE 2.79* 2.56* 2.21* 2.11* 2.00*  CALCIUM 9.2 8.5* 8.1* 8.1* 8.8*    CBC: Recent Labs  Lab 05/10/22 1208 05/11/22 0457 05/13/22 1049  WBC 15.9* 16.7* 12.0*  NEUTROABS 12.6*  --   --   HGB 13.6 11.0* 11.3*  HCT 44.4 33.8* 34.5*  MCV 85.7 82.0 80.0  PLT 320 274 281    LFT Recent Labs  Lab 05/10/22 1208  AST 22  ALT 12   ALKPHOS 70  BILITOT 1.1  PROT 7.7  ALBUMIN 3.2*     Antibiotics: Anti-infectives (From admission, onward)    Start     Dose/Rate Route Frequency Ordered Stop   05/11/22 1815  ampicillin (OMNIPEN) 1 g in sodium chloride 0.9 % 100 mL IVPB  Status:  Discontinued        1 g 300 mL/hr over 20 Minutes Intravenous Every 6 hours 05/11/22 1725 05/11/22 1729   05/11/22 1815  ampicillin (OMNIPEN) 1 g in sodium chloride 0.9 % 100 mL IVPB        1 g 300 mL/hr over 20 Minutes Intravenous 2 times daily 05/11/22 1729     05/11/22 1200  cefTRIAXone (ROCEPHIN) 2 g in sodium chloride 0.9 % 100 mL IVPB  Status:  Discontinued        2 g 200 mL/hr over 30 Minutes Intravenous Every 24 hours 05/10/22 1537 05/11/22 1724   05/10/22 1430  cefTRIAXone (ROCEPHIN) 2 g in sodium chloride 0.9 % 100 mL IVPB        2 g 200 mL/hr over 30 Minutes Intravenous  Once 05/10/22 1418 05/10/22 1511        DVT prophylaxis: SCDs  Code Status: DNR  Family Communication: No family at bedside   CONSULTS palliative care   Objective    Physical Examination:   General-appears in no acute distress Heart-S1-S2, regular, no murmur auscultated Lungs-clear to auscultation bilaterally, no wheezing or crackles auscultated Abdomen-soft, nontender, no organomegaly Extremities-no edema in the lower extremities Neuro-alert, confused   Status is: Inpatient:             Patricia Dudley   [facility] If 7PM-7AM, please contact night-coverage at [email or web address], Office  [phone]   05/13/2022, 1:52 PM  LOS: 2 days

## 2022-05-13 NOTE — Progress Notes (Signed)
[place] 3[facility] 05 [facility]) Hospital Liaison Note  Met with [name] at bedside. Eligibility confirmed for Hospice Home. [name] requests transfer tomorrow morning to [facility]. Consents being completed today at 2 pm.  Please call with any hospice related questions or concerns.  Thank you, Margaretmary Eddy, BSN, RN [facility] Liaison [phone]

## 2022-05-13 NOTE — TOC Initial Note (Addendum)
Transition of Care [facility]) - Initial/Assessment Note    Patient Details  Name: Patricia Dudley MRN: [id number] Date of Birth: February 27, 1930  [facility]) CM/SW Contact:    Pollie Friar, RN Phone Number: 05/13/2022, 10:55 AM  Clinical Narrative:                 Plan is for residential hospice versus home with hospice.  Cm met with the patients daughter at the bedside. She states they have decided on [facility]osp[facility] home of [place]. CM has updated Venia Carbon with [facility]. Daughter did have some questions about the financial aspects of hospice. CM has asked Anderson Malta to reach out to the daughter.  TOC following.  1219: Pt has been accepted to [facility]. Plan will be to have her d/c tomorrow.   Expected Discharge Plan: [place] Barriers to Discharge: Continued Medical Work up   Patient Goals and CMS Choice   CMS Medicare.gov Compare Post Acute Care list provided to:: Patient Represenative (must comment) Choice offered to / list presented to : Adult Children  Expected Discharge Plan and Services Expected Discharge Plan: [place]   Discharge Planning Services: CM Consult Post Acute Care Choice: Residential Hospice Bed Living arrangements for the past 2 months: Single Family Home                                      Prior Living Arrangements/Services Living arrangements for the past 2 months: Single Family Home Lives with:: Adult Children Patient language and need for interpreter reviewed:: Yes        Need for Family Participation in Patient Care: Yes (Comment)     Criminal Activity/Legal Involvement Pertinent to Current Situation/Hospitalization: No - Comment as needed  Activities of Daily Living Home Assistive Devices/Equipment: None ADL Screening (condition at time of admission) Patient's cognitive ability adequate to safely complete daily activities?: No Is the patient deaf or have difficulty  hearing?: No Does the patient have difficulty seeing, even when wearing glasses/contacts?: No Does the patient have difficulty concentrating, remembering, or making decisions?: Yes Patient able to express need for assistance with ADLs?: No Does the patient have difficulty dressing or bathing?: Yes Independently performs ADLs?: No Does the patient have difficulty walking or climbing stairs?: Yes Weakness of Legs: Both Weakness of Arms/Hands: Both  Permission Sought/Granted                  Emotional Assessment Appearance:: Appears stated age         Psych Involvement: No (comment)  Admission diagnosis:  Hypernatremia [E87.0] Acute cystitis without hematuria [N30.00] Acute encephalopathy [X91.47] Metabolic encephalopathy [W29.56] Patient Active Problem List   Diagnosis Date Noted   Metabolic encephalopathy 21/30/8657   CKD (chronic kidney disease) stage 3, GFR 30-59 ml/min ([place]) 05/10/2022   Acute encephalopathy 05/10/2022   Sepsis secondary to UTI ([place]) 05/10/2022   Dysphagia 05/10/2022   Memory loss 05/10/2022   Hypernatremia/dehydration/FTT    Acute embolic stroke ([place]) 84/69/6295   Malnutrition of moderate degree 12/27/2021   PNA (pneumonia) 12/24/2021   Cerebral thrombosis with cerebral infarction 12/24/2021   Hemiplegia and hemiparesis following cerebral infarction affecting right dominant side ([place]) 12/04/2021   Anemia, unspecified 06/13/2017   Diabetic nephropathy ([place]) 06/13/2017   GERD (gastroesophageal reflux disease) 06/13/2017   Hyperlipidemia 06/13/2017   Hypertension 06/13/2017   Osteoporosis 06/13/2017   Acute posthemorrhagic anemia  Gastritis without bleeding    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) 12/20/2016   Hypertensive urgency 12/19/2016   GI bleed 08/07/2016   Type 2 diabetes mellitus ([place]) 11/01/2015   Chronic a-fib ([place]) 09/01/2015   Malignant  essential hypertension 07/26/2015   Chronic kidney disease 07/26/2015   TIA (transient ischemic attack) 07/26/2015   Vertigo 07/26/2015   Acquired hypothyroidism 07/04/2015   PCP:  Tracie Harrier, MD Pharmacy:   [facility] [address] [place] [facility] [place] [address] [place] [address] Phone: [phone] Fax: [phone]     Social Determinants of Health (SDOH) Interventions    Readmission Risk Interventions     No data to display

## 2022-05-14 ENCOUNTER — Other Ambulatory Visit: Payer: Medicare PPO | Admitting: Student

## 2022-05-14 DIAGNOSIS — G9340 Encephalopathy, unspecified: Secondary | ICD-10-CM | POA: Diagnosis not present

## 2022-05-14 LAB — BASIC METABOLIC PANEL
Anion gap: 11 (ref 5–15)
BUN: 27 mg/dL — ABNORMAL HIGH (ref 8–23)
CO2: 20 mmol/L — ABNORMAL LOW (ref 22–32)
Calcium: 8.6 mg/dL — ABNORMAL LOW (ref 8.9–10.3)
Chloride: 117 mmol/L — ABNORMAL HIGH (ref 98–111)
Creatinine, Ser: 2.07 mg/dL — ABNORMAL HIGH (ref 0.44–1.00)
GFR, Estimated: 22 mL/min — ABNORMAL LOW (ref >60)
Glucose, Bld: 239 mg/dL — ABNORMAL HIGH (ref 70–99)
Potassium: 3.8 mmol/L (ref 3.5–5.1)
Sodium: 148 mmol/L — ABNORMAL HIGH (ref 135–145)

## 2022-05-14 LAB — GLUCOSE, CAPILLARY: Glucose-Capillary: 230 mg/dL — ABNORMAL HIGH (ref 70–99)

## 2022-05-14 LAB — MAGNESIUM: Magnesium: 1.9 mg/dL (ref 1.7–2.4)

## 2022-05-14 NOTE — TOC Transition Note (Addendum)
Transition of Care [facility]) - CM/SW Discharge Note   Patient Details  Name: Patricia Dudley MRN: [id number] Date of Birth: [birth year]  Transition of Care [facility]) CM/SW Contact:  Pollie Friar, RN Phone Number: 05/14/2022, 9:52 AM   Clinical Narrative:    Pt is discharging to [place]. She will transport via [place]. D/c packet at the desk and bedside RN updated. Daughter, [name] is aware.  Number for report: [phone]   Final next level of care: Hospice Medical Facility Barriers to Discharge: No Barriers Identified   Patient Goals and CMS Choice   CMS Medicare.gov Compare Post Acute Care list provided to:: Patient Represenative (must comment) Choice offered to / list presented to : Adult Children  Discharge Placement                       Discharge Plan and Services   Discharge Planning Services: CM Consult Post Acute Care Choice: Residential Hospice Bed                               Social Determinants of Health (SDOH) Interventions     Readmission Risk Interventions     No data to display

## 2022-05-14 NOTE — Discharge Summary (Signed)
Physician Discharge Summary   Patient: Patricia Dudley MRN: [id number] DOB: 12/17/1929  Admit date:     05/10/2022  Discharge date: 05/14/22  Discharge Physician: [name]   PCP: Tracie Harrier, MD   Recommendations at discharge:   Patient to be discharged to residential hospice  Discharge Diagnoses: Principal Problem:   Acute encephalopathy Active Problems:   Hypernatremia/dehydration/FTT   Sepsis secondary to UTI ([place])   Chronic a-fib (HCC)   Dysphagia   Type 2 diabetes mellitus (HCC)   Hypertension   CKD (chronic kidney disease) stage 3, GFR 30-59 ml/min (HCC)   Hemiplegia and hemiparesis following cerebral infarction affecting right dominant side (HCC)   Acquired hypothyroidism   Hyperlipidemia   GERD (gastroesophageal reflux disease)   Memory loss   Metabolic encephalopathy  Resolved Problems:   * No resolved hospital problems. 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  As per daughter patient over the past 2 to 3 weeks had stopped eating and became less responsive than her baseline.  When patient's family checked on her she was having shallow breathing, was slow to respond.  She has history of dysphagia and is on dysphagia 2 diet with nectar thick liquids.    Assessment and Plan:  Acute metabolic encephalopathy -Resolved, back to baseline -Patient presented with mental status change in the setting of dementia, poor p.o. intake over the past 2 to 3 weeks -Empirically started on Rocephin for possible UTI; urine culture growing greater than 100,000 colonies per mL of Enterococcus faecalis;  -Antibiotics switched from IV Rocephin to IV ampicillin   Sepsis due to UTI -Urine  culture growing Enterococcus faecalis as above -Started on ampicillin as above -Patient was treated with IV ampicillin while in the hospital -Now patient to be discharged residential hospice, will discontinue IV antibiotics   Acute kidney injury on CKD stage IV -Creatinine improved to 2.07   Hypernatremia -Patient presented with hyponatremia with sodium up to 155 in setting of poor p.o. intake -Started on D5W at 75 mL/h -Sodium improving, today sodium is down to 148   Dysphagia -Swallow evaluation obtained today -Patient started on dysphagia 1 diet with nectar thick liquids   Chronic atrial fibrillation -Continue Lopressor twice daily    Hypertension -Continue metoprolol, amlodipine   Diabetes mellitus type 2 -Hemoglobin A1c 6.5, well controlled -Glipizide will be discontinued   History of CVA -We will discontinue aspirin and Crestor, patient to be transferred to residential hospice   Hypothyroidism -Continue Synthroid 112 mcg daily   Hyperlipidemia -Discontinue Crestor      Goals of care -Palliative care consulted for goals of care -Family has opted for hospice care -We will transfer to residential hospice today.       Consultants: Palliative care Procedures performed: None Disposition: Residential hospice Diet recommendation: Comfort diet Discharge Diet Orders (From admission, onward)     Start     Ordered   05/14/22 0000  Diet - low sodium heart healthy        05/14/22 0930            DISCHARGE MEDICATION: Allergies as of 05/14/2022       Reactions   Ezetimibe Other (See Comments)   unknown   Iodine Rash  Rosuvastatin Rash   Joint pain        Medication List     STOP taking these medications    amLODipine 5 MG tablet Commonly known as: NORVASC   aspirin EC 81 MG tablet   cholecalciferol 25 MCG (1000 UNIT) tablet Commonly known as: VITAMIN D3   cyanocobalamin 1000 MCG tablet Commonly known as: VITAMIN D63   folic acid 875  MCG tablet Commonly known as: FOLVITE   glipiZIDE 10 MG tablet Commonly known as: GLUCOTROL   insulin glargine 100 UNIT/ML injection Commonly known as: LANTUS   insulin glargine-yfgn 100 UNIT/ML injection Commonly known as: SEMGLEE   metoprolol tartrate 25 MG tablet Commonly known as: LOPRESSOR   pantoprazole 40 MG tablet Commonly known as: Protonix   polycarbophil 625 MG tablet Commonly known as: FIBERCON   vitamin B-12 100 MCG tablet Commonly known as: CYANOCOBALAMIN       TAKE these medications    acetaminophen 325 MG tablet Commonly known as: TYLENOL Take 1-2 tablets (325-650 mg total) by mouth every 4 (four) hours as needed for mild pain.   latanoprost 0.005 % ophthalmic solution Commonly known as: XALATAN Place 1 drop into both eyes at bedtime.   levothyroxine 112 MCG tablet Commonly known as: SYNTHROID Take 1 tablet (112 mcg total) by mouth as directed. Take 1 tablet Mon-Fri        Discharge Exam: Filed Weights   05/11/22 1700  Weight: 54 kg   General-alert, in no acute distress Heart S1-S2, regular Lungs clear to auscultation bilaterally  Condition at discharge: stable  The results of significant diagnostics from this hospitalization (including imaging, microbiology, ancillary and laboratory) are listed below for reference.   Imaging Studies: CT Head Wo Contrast  Result Date: 05/10/2022 CLINICAL DATA:  Dizziness.  Altered mental status EXAM: CT HEAD WITHOUT CONTRAST TECHNIQUE: Contiguous axial images were obtained from the base of the skull through the vertex without intravenous contrast. RADIATION DOSE REDUCTION: This exam was performed according to the departmental dose-optimization program which includes automated exposure control, adjustment of the mA and/or kV according to patient size and/or use of iterative reconstruction technique. COMPARISON:  CT head 12/24/2021 FINDINGS: Brain: Generalized atrophy. Negative for hydrocephalus. Extensive  white matter hypodensity bilaterally. Small chronic cortical infarct right frontal parietal lobe. Chronic infarct right occipital lobe. Small chronic infarcts in the cerebellum bilaterally. Negative for acute infarct, hemorrhage, mass Vascular: Negative for hyperdense vessel Skull: Negative Sinuses/Orbits: Mucosal edema in bony thickening left sphenoid sinus. Small air-fluid level right sphenoid sinus. Bilateral cataract extraction Other: None IMPRESSION: Generalized atrophy with extensive chronic ischemia No acute abnormality. Electronically Signed   By: Franchot Gallo M.D.   On: 05/10/2022 13:42   DG Chest Portable 1 View  Result Date: 05/10/2022 CLINICAL DATA:  Pneumonia. EXAM: PORTABLE CHEST 1 VIEW COMPARISON:  Dec 26, 2021. FINDINGS: The heart size and mediastinal contours are within normal limits. Both lungs are clear. The visualized skeletal structures are unremarkable. IMPRESSION: No active disease. Electronically Signed   By: Marijo Conception M.D.   On: 05/10/2022 12:20    Microbiology: Results for orders placed or performed during the hospital encounter of 05/10/22  Culture, blood (Routine x 2)     Status: None (Preliminary result)   Collection Time: 05/10/22 11:56 AM   Specimen: BLOOD  Result Value Ref Range Status   Specimen Description BLOOD RIGHT ANTECUBITAL  Final   Special Requests   Final    BOTTLES DRAWN AEROBIC AND ANAEROBIC Blood Culture  results may not be optimal due to an inadequate volume of blood received in culture bottles   Culture   Final    NO GROWTH 4 DAYS Performed at [facility], [place] [address] [place], [place] [address]    Report Status PENDING  Incomplete  Urine Culture     Status: Abnormal   Collection Time: 05/10/22  1:26 PM   Specimen: In/Out Cath Urine  Result Value Ref Range Status   Specimen Description IN/OUT CATH URINE  Final   Special Requests   Final    NONE Performed at [facility], [place] [address] [place], [place] [address]     Culture >=100,000 COLONIES/mL ENTEROCOCCUS FAECALIS (A)  Final   Report Status 05/12/2022 FINAL  Final   Organism ID, Bacteria ENTEROCOCCUS FAECALIS (A)  Final      Susceptibility   Enterococcus faecalis - MIC*    AMPICILLIN <=2 SENSITIVE Sensitive     NITROFURANTOIN <=16 SENSITIVE Sensitive     VANCOMYCIN 1 SENSITIVE Sensitive     * >=100,000 COLONIES/mL ENTEROCOCCUS FAECALIS  Culture, blood (Routine x 2)     Status: None (Preliminary result)   Collection Time: 05/10/22  9:00 PM   Specimen: BLOOD LEFT ARM  Result Value Ref Range Status   Specimen Description BLOOD LEFT ARM  Final   Special Requests   Final    BOTTLES DRAWN AEROBIC ONLY Blood Culture adequate volume   Culture   Final    NO GROWTH 4 DAYS Performed at [facility], [place] [address], [place], [place] [address]    Report Status PENDING  Incomplete    Labs: CBC: Recent Labs  Lab 05/10/22 1208 05/11/22 0457 05/13/22 1049  WBC 15.9* 16.7* 12.0*  NEUTROABS 12.6*  --   --   HGB 13.6 11.0* 11.3*  HCT 44.4 33.8* 34.5*  MCV 85.7 82.0 80.0  PLT 320 274 932   Basic Metabolic Panel: Recent Labs  Lab 05/10/22 2100 05/11/22 0457 05/12/22 0501 05/13/22 1049 05/14/22 0547  NA 151* 155* 149* 149* 148*  K 4.2 4.2 3.9 3.7 3.8  CL 116* 122* 119* 116* 117*  CO2 21* 22 19* 21* 20*  GLUCOSE 174* 140* 187* 184* 239*  BUN 44* 42* 37* 28* 27*  CREATININE 2.56* 2.21* 2.11* 2.00* 2.07*  CALCIUM 8.5* 8.1* 8.1* 8.8* 8.6*  MG  --   --   --   --  1.9   Liver Function Tests: Recent Labs  Lab 05/10/22 1208  AST 22  ALT 12  ALKPHOS 70  BILITOT 1.1  PROT 7.7  ALBUMIN 3.2*   CBG: Recent Labs  Lab 05/13/22 0636 05/13/22 1137 05/13/22 1727 05/13/22 2216 05/14/22 0607  GLUCAP 233* 217* 240* 142* 230*    Discharge time spent: greater than 30 minutes.  Signed: [name], MD [facility] 05/14/2022

## 2022-05-14 NOTE — Care Management Important Message (Signed)
Important Message  Patient Details  Name: Patricia Dudley MRN: [id number] Date of Birth: 1929-12-19   Medicare Important Message Given:  Yes     Hannah Beat 05/14/2022, 2:07 PM

## 2022-05-15 LAB — CULTURE, BLOOD (ROUTINE X 2)
Culture: NO GROWTH
Culture: NO GROWTH
Special Requests: ADEQUATE

## 2022-05-26 DEATH — deceased

## 2023-05-29 IMAGING — CR DG CHEST 2V
2 series · 2 of 2 positions shown · non-contrast
Comparison: 06/08/2021

CLINICAL DATA: Weakness, recently diagnosed as [id number] positive,
history atrial fibrillation, stroke, dementia, diabetes mellitus,
hypertension

EXAM:
CHEST - 2 VIEW

[chest lat]
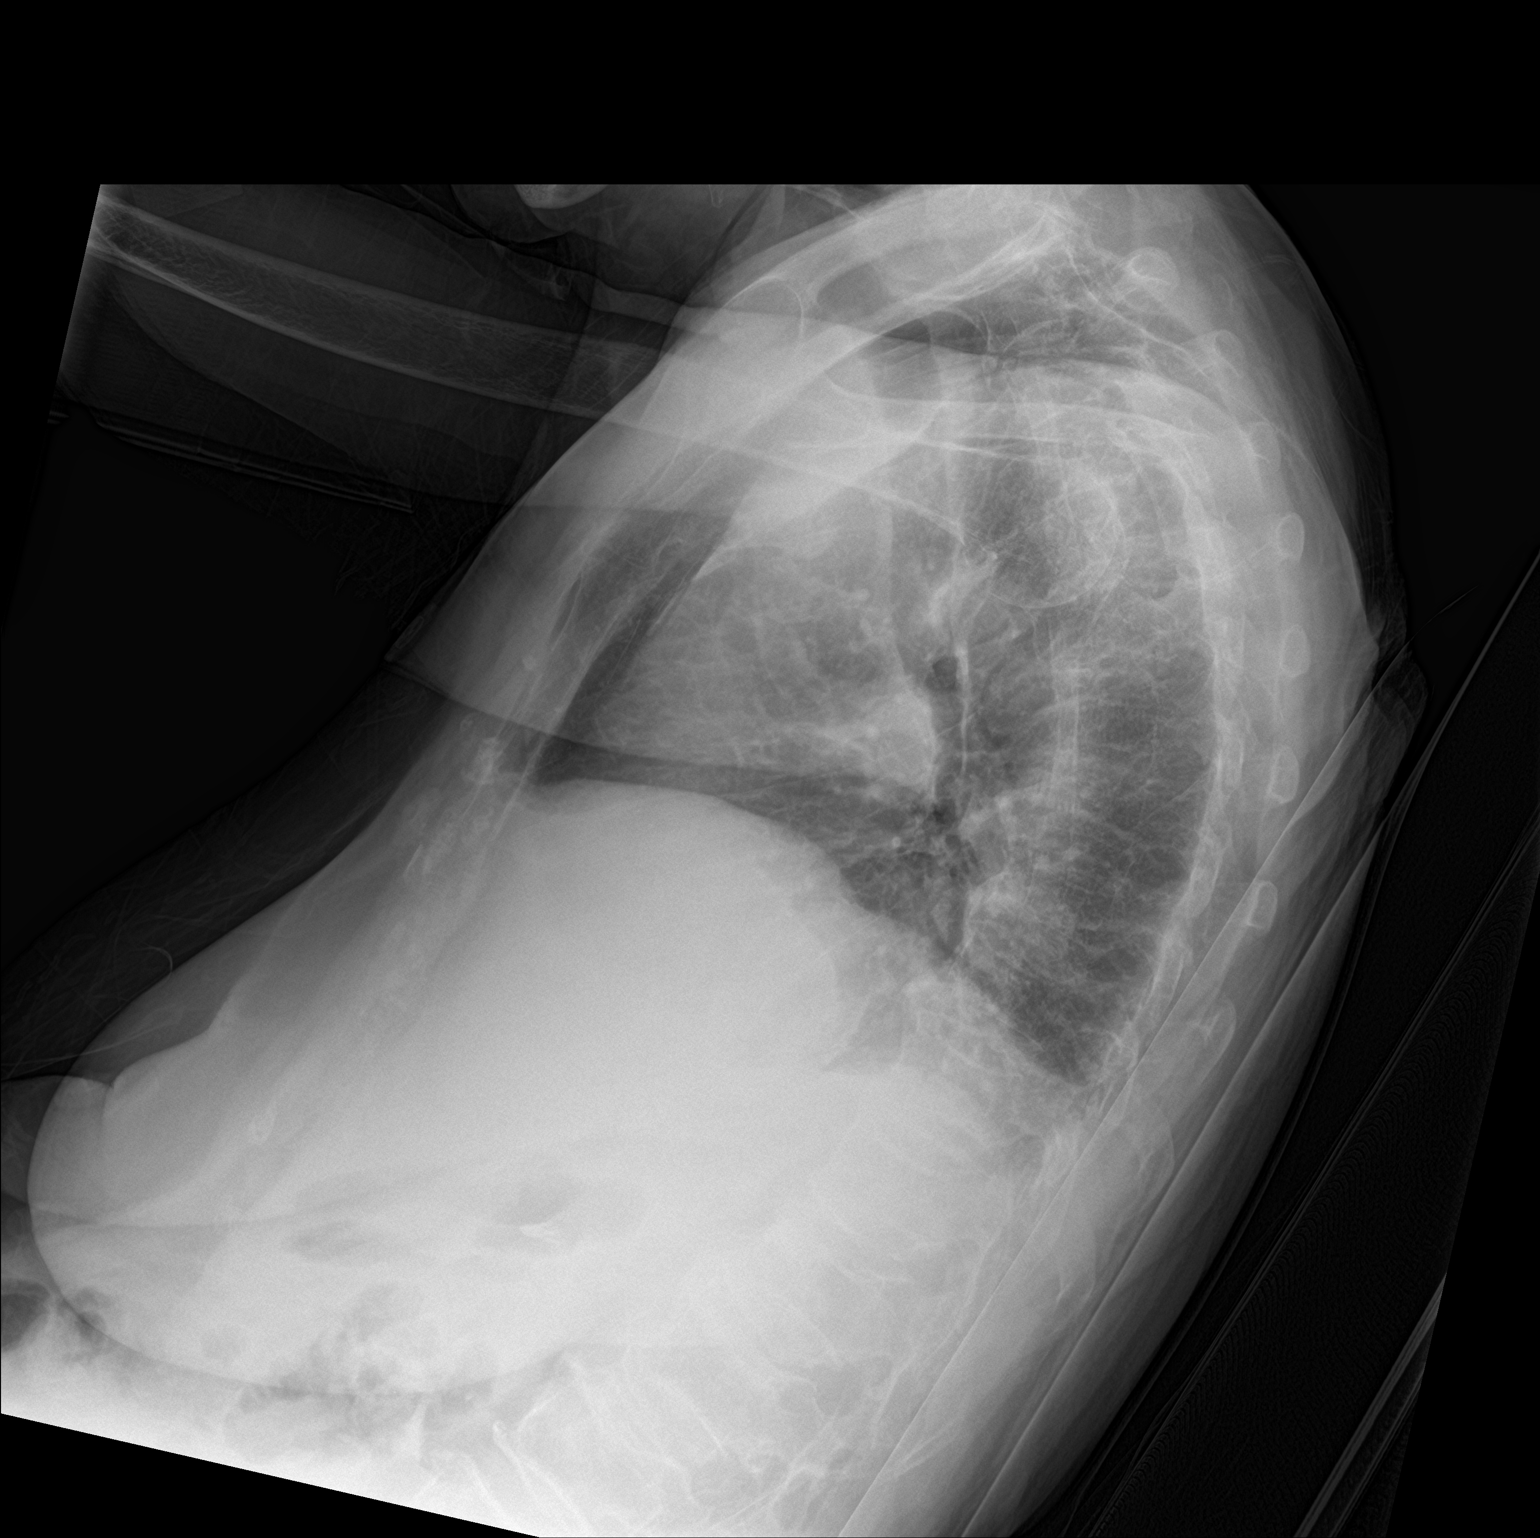

[chest ap]
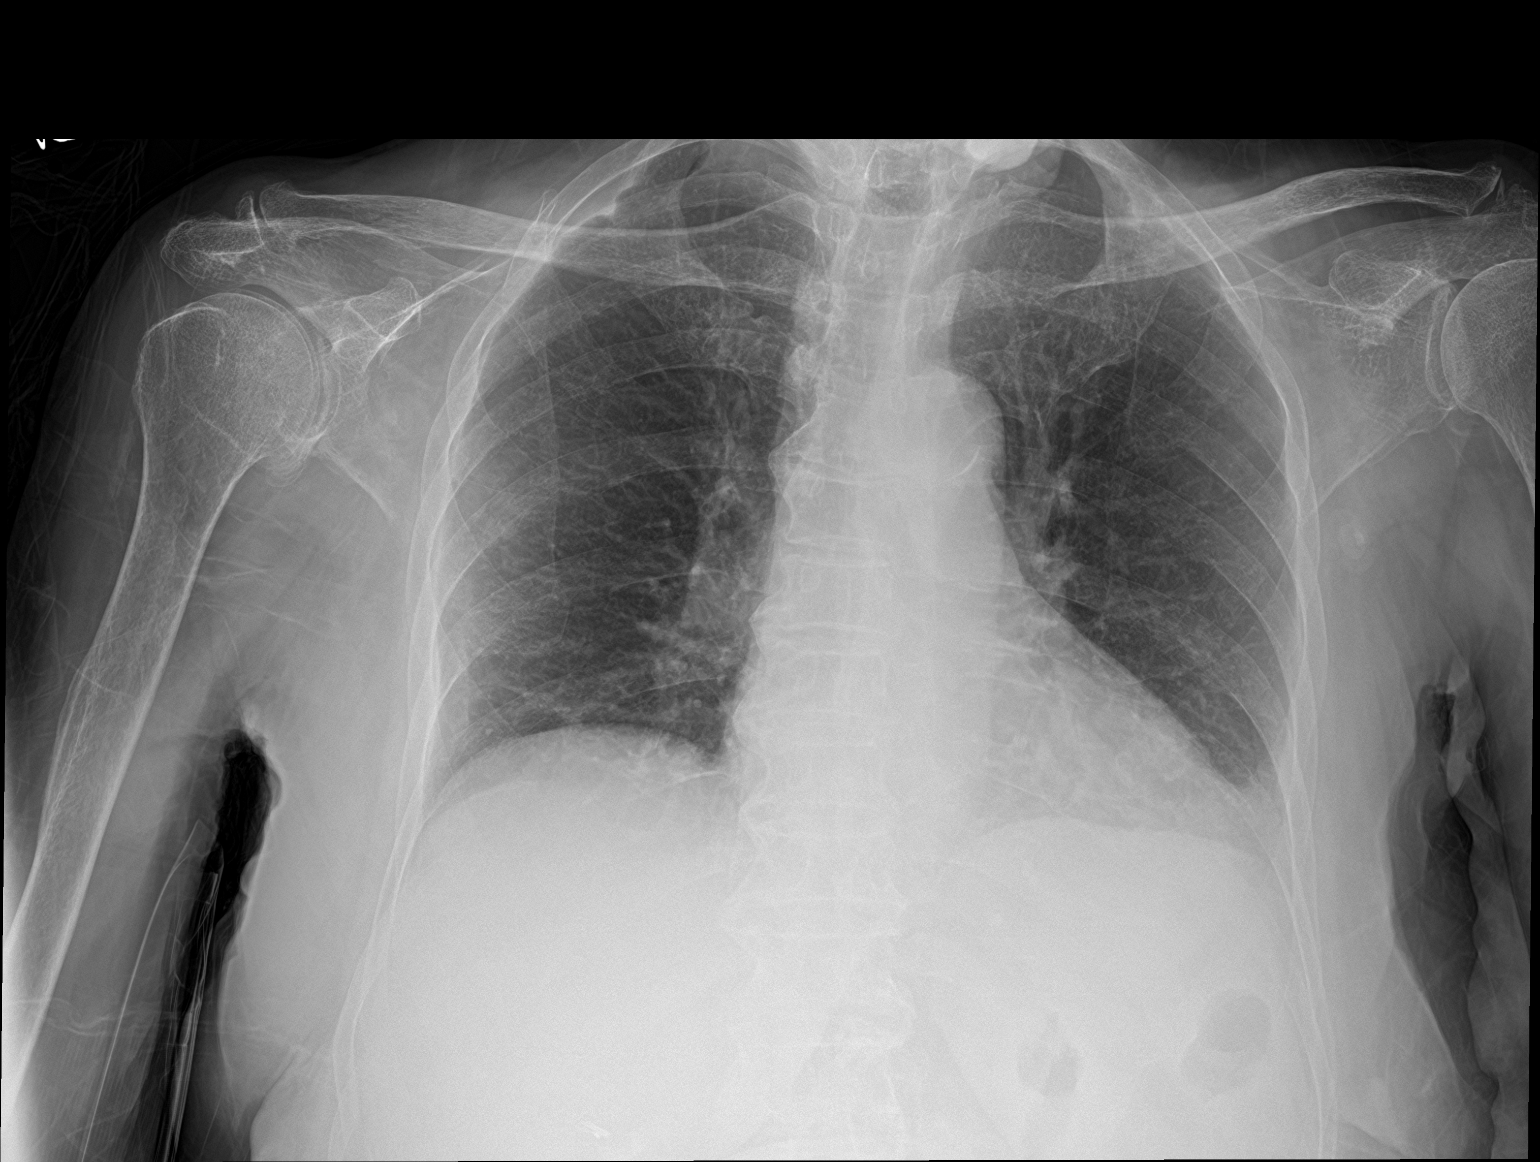

[2 of 2 positions shown; findings below may reference images not displayed]

FINDINGS: Normal heart size, mediastinal contours, and pulmonary vascularity.

Atherosclerotic calcification aorta.

Minimal RIGHT basilar atelectasis.

Lungs otherwise clear.

No pulmonary infiltrate, pleural effusion, or pneumothorax.

Bones demineralized with scattered degenerative disc disease changes
thoracic spine and RIGHT glenohumeral degenerative changes.
IMPRESSION: Minimal RIGHT basilar atelectasis.

Aortic Atherosclerosis ([id number]).

## 2023-06-12 IMAGING — MR MR HEAD W/O CM
12 series · 48 of 48 positions shown · non-contrast
Comparison: CT head 06/08/2021.  MRI head 12/20/2016

CLINICAL DATA: Memory loss, unsteadiness. Recent fall with head
injury.

EXAM:
MRI HEAD WITHOUT CONTRAST
TECHNIQUE: Multiplanar, multiecho pulse sequences of the brain and surrounding
structures were obtained without intravenous contrast.

[Series 5: ax dwi_tracew · axial · 3.0mm · 0.65mm/px · z∈[-91,+64]mm · 4 of 48 slices shown]
[im 1/48]
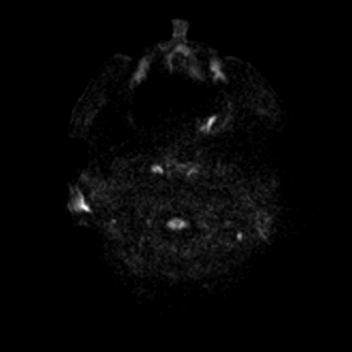
[im 16/48]
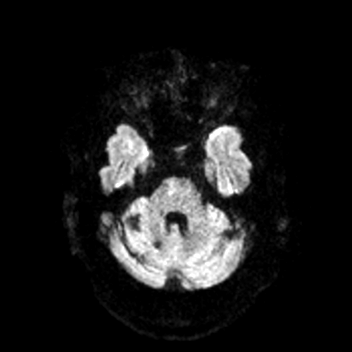
[im 32/48]
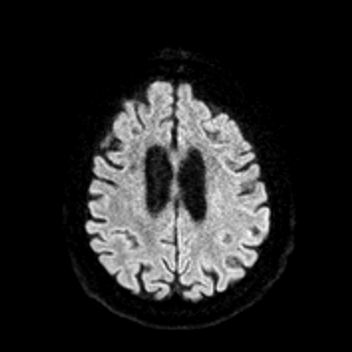
[im 48/48]
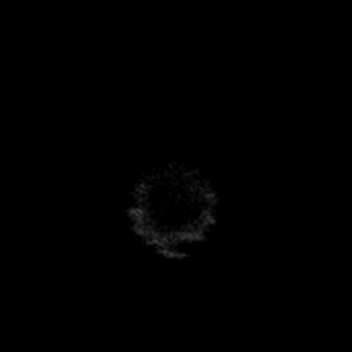

[Series 6: ax dwi_adc · axial · 3.0mm · 0.65mm/px · z∈[-91,+64]mm · 3 of 48 slices shown]
[im 1/48]
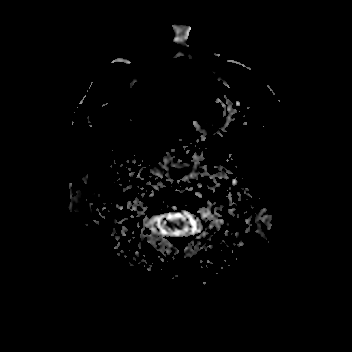
[im 24/48]
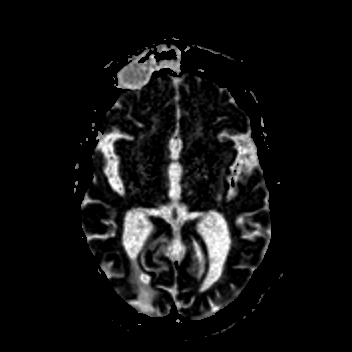
[im 48/48]
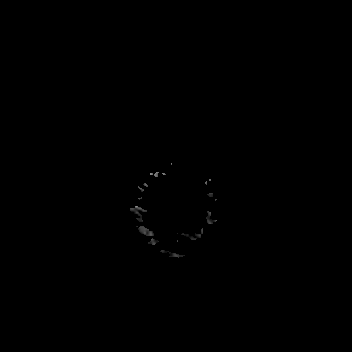

[Series 7: cor dwi_tracew · coronal · 5.0mm · 0.68mm/px · 3 of 40 slices shown]
[im 1/40]
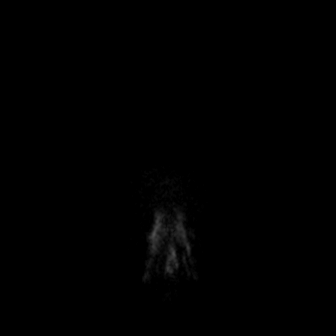
[im 20/40]
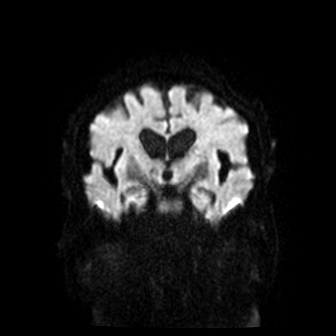
[im 40/40]
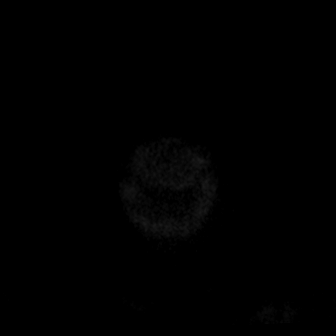

[Series 8: cor dwi_adc · coronal · 5.0mm · 0.68mm/px · 3 of 40 slices shown]
[im 1/40]
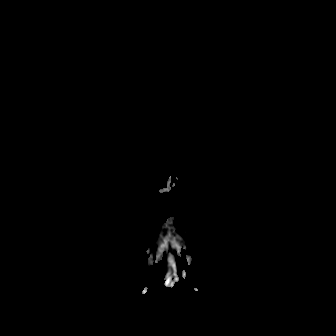
[im 20/40]
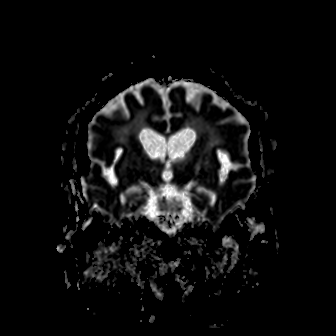
[im 40/40]
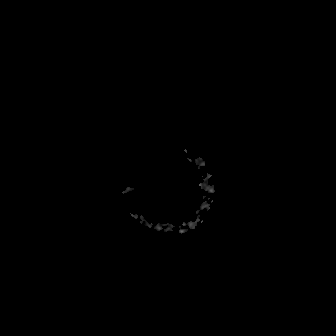

[Series 9: T1 · sagittal · 5.0mm · 0.62mm/px · 2 of 21 slices shown (1 of 2)]
[im 1/21]
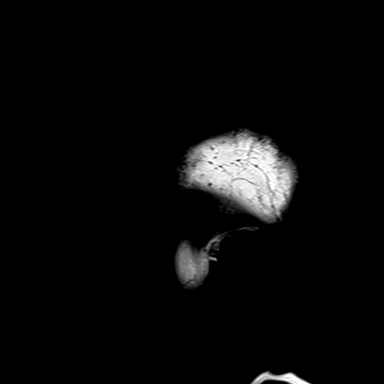
[im 21/21]
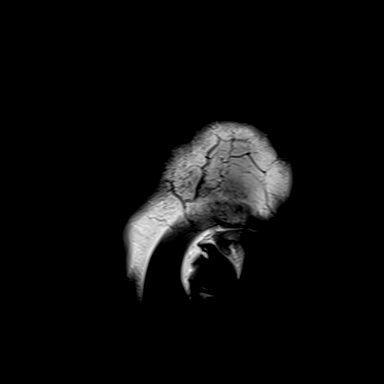

[Series 10: T2 · axial · 5.0mm · 0.53mm/px · z∈[-86,+58]mm · 2 of 25 slices shown (1 of 2)]
[im 1/25]
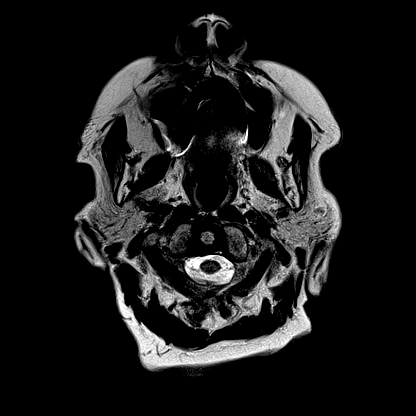
[im 25/25]
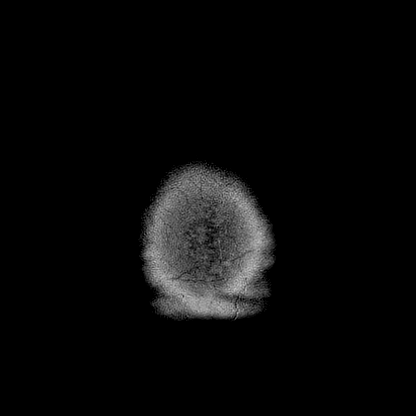

[Series 11: mag_images · axial · 3.0mm · 0.90mm/px · z∈[-103,+73]mm · 4 of 60 slices shown]
[im 1/60]
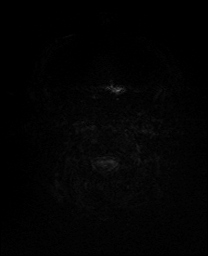
[im 20/60]
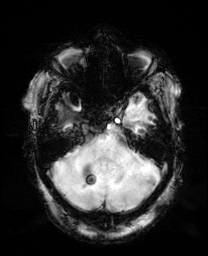
[im 40/60]
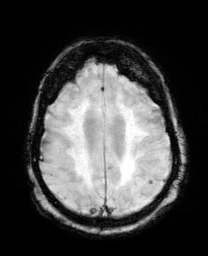
[im 60/60]
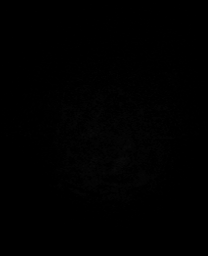

[Series 12: pha_images · axial · 3.0mm · 0.90mm/px · z∈[-103,+73]mm · 4 of 59 slices shown]
[im 1/59]
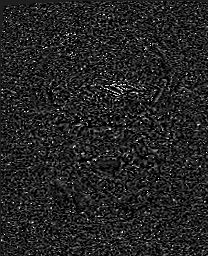
[im 20/59]
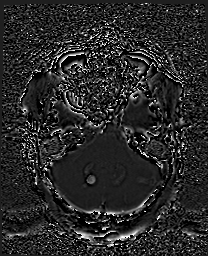
[im 39/59]
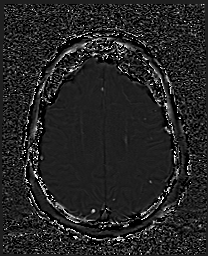
[im 59/59]
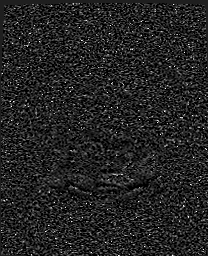

[Series 13: swi_images · axial · 3.0mm · 0.90mm/px · z∈[-103,+73]mm · 4 of 60 slices shown]
[im 1/60]
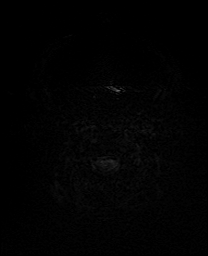
[im 20/60]
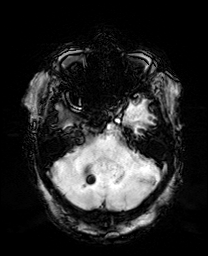
[im 40/60]
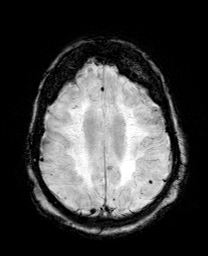
[im 60/60]
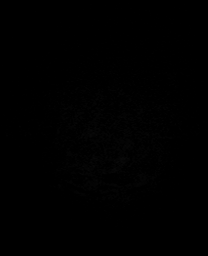

[Series 15: FLAIR · axial · 3.0mm · 0.53mm/px · z∈[-96,+66]mm · 4 of 55 slices shown]
[im 1/55]
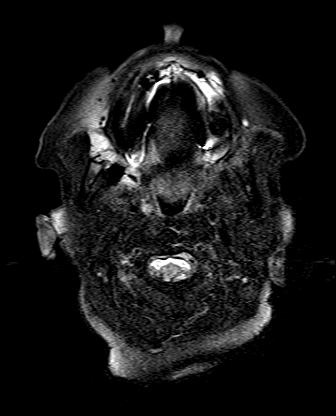
[im 19/55]
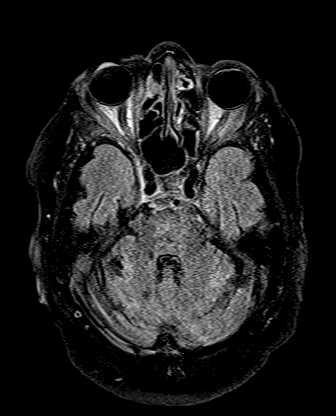
[im 37/55]
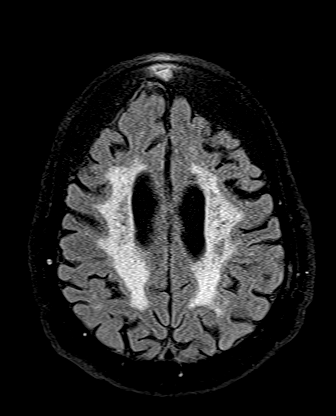
[im 55/55]
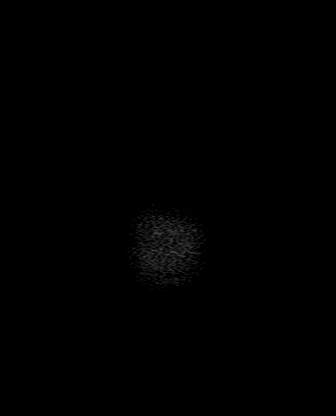

[Series 16: T1 · axial · 1.0mm · 0.98mm/px · z∈[-102,+73]mm · 13 of 176 slices shown (2 of 2)]
[im 1/176]
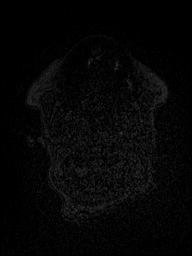
[im 15/176]
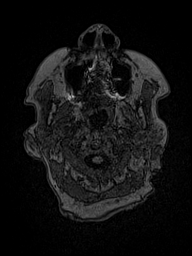
[im 30/176]
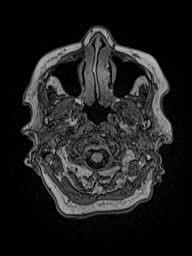
[im 44/176]
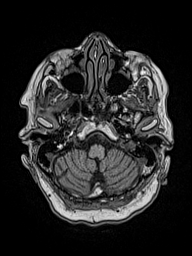
[im 59/176]
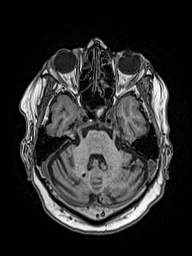
[im 73/176]
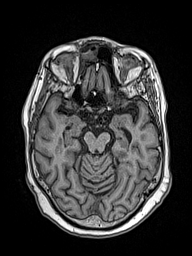
[im 88/176]
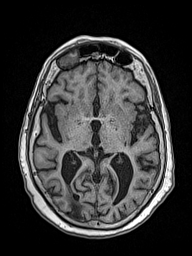
[im 103/176]
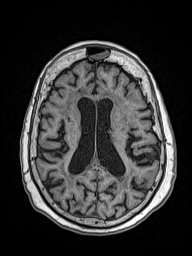
[im 117/176]
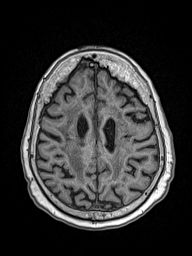
[im 132/176]
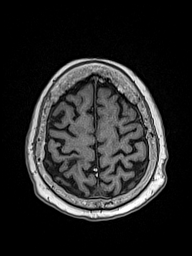
[im 146/176]
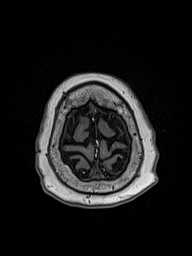
[im 161/176]
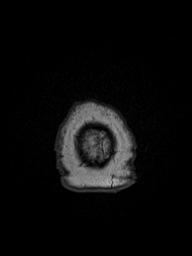
[im 176/176]
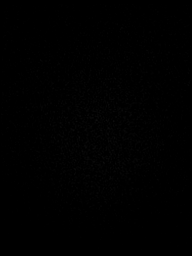

[Series 17: T2 · coronal · 5.0mm · 0.45mm/px · 2 of 33 slices shown (2 of 2)]
[im 1/33]
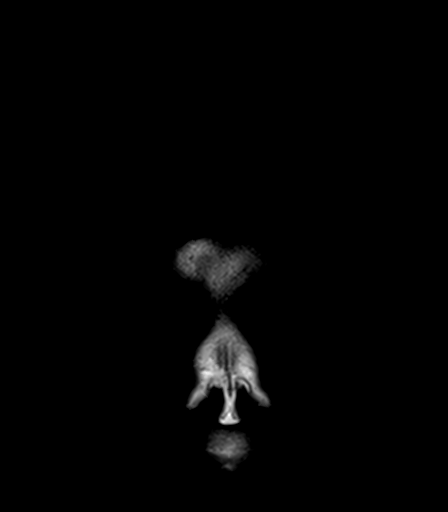
[im 33/33]
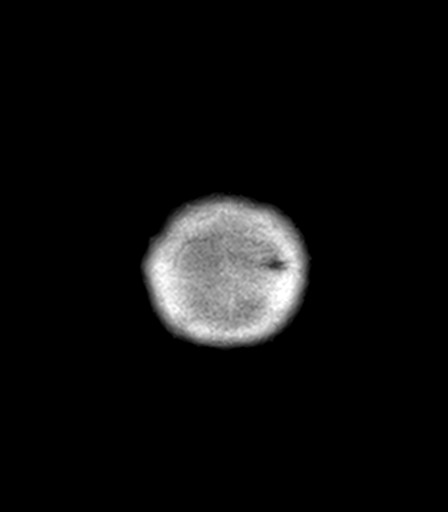

[48 of 48 positions shown; findings below may reference images not displayed]

FINDINGS: Brain: Moderate atrophy. Mild progression since 2207. Negative for
hydrocephalus. Negative for acute infarct

Extensive chronic microvascular ischemic change throughout the white
matter. Small chronic infarcts in the cerebellum bilaterally. Mild
chronic ischemic change in the basal ganglia and pons.

Extensive microhemorrhage in the brain. This is present in both
cerebral hemispheres as well as in the basal ganglia and thalami.
Chronic hemorrhage in the left cerebellar infarct and in the right
medial cerebellum. Overall microhemorrhage has progressed since
2207.

Vascular: Normal arterial flow voids.

Skull and upper cervical spine: Negative

Sinuses/Orbits: Mucosal edema paranasal sinuses. Air-fluid level
frontal sinus. Bilateral cataract extraction.

Other: None
IMPRESSION: Negative for acute infarct

Moderate atrophy.  Extensive microvascular ischemia

Extensive chronic microhemorrhage in the brain with progression
since 2207. Differential diagnosis includes poorly controlled
hypertension versus cerebral amyloid.
